# Patient Record
Sex: Female | Born: 1965
Health system: Southern US, Community
[De-identification: ages and names within clinical notes are randomized; demographics above are authoritative.]

## PROBLEM LIST (undated history)

## (undated) DIAGNOSIS — I509 Heart failure, unspecified: Secondary | ICD-10-CM

## (undated) DIAGNOSIS — L409 Psoriasis, unspecified: Secondary | ICD-10-CM

## (undated) DIAGNOSIS — I10 Essential (primary) hypertension: Secondary | ICD-10-CM

## (undated) DIAGNOSIS — E785 Hyperlipidemia, unspecified: Secondary | ICD-10-CM

## (undated) DIAGNOSIS — M1990 Unspecified osteoarthritis, unspecified site: Secondary | ICD-10-CM

## (undated) DIAGNOSIS — E079 Disorder of thyroid, unspecified: Secondary | ICD-10-CM

## (undated) DIAGNOSIS — Z973 Presence of spectacles and contact lenses: Secondary | ICD-10-CM

## (undated) DIAGNOSIS — I429 Cardiomyopathy, unspecified: Secondary | ICD-10-CM

## (undated) HISTORY — PX: TONSILLECTOMY: SUR1361

## (undated) HISTORY — DX: Disorder of thyroid, unspecified: E07.9

## (undated) HISTORY — DX: Cardiomyopathy, unspecified: I42.9

## (undated) HISTORY — DX: Psoriasis, unspecified: L40.9

## (undated) HISTORY — PX: TUBAL LIGATION: SHX77

## (undated) HISTORY — DX: Essential (primary) hypertension: I10

## (undated) HISTORY — DX: Hyperlipidemia, unspecified: E78.5

## (undated) HISTORY — PX: THYROIDECTOMY: SHX17

---

## 2010-07-12 DIAGNOSIS — E559 Vitamin D deficiency, unspecified: Secondary | ICD-10-CM | POA: Insufficient documentation

## 2010-07-12 DIAGNOSIS — I429 Cardiomyopathy, unspecified: Secondary | ICD-10-CM | POA: Insufficient documentation

## 2010-07-12 DIAGNOSIS — R928 Other abnormal and inconclusive findings on diagnostic imaging of breast: Secondary | ICD-10-CM | POA: Insufficient documentation

## 2010-07-12 DIAGNOSIS — G4700 Insomnia, unspecified: Secondary | ICD-10-CM | POA: Insufficient documentation

## 2010-07-12 DIAGNOSIS — Z01419 Encounter for gynecological examination (general) (routine) without abnormal findings: Secondary | ICD-10-CM | POA: Insufficient documentation

## 2010-07-12 DIAGNOSIS — Z1239 Encounter for other screening for malignant neoplasm of breast: Secondary | ICD-10-CM | POA: Insufficient documentation

## 2010-07-12 DIAGNOSIS — E039 Hypothyroidism, unspecified: Secondary | ICD-10-CM | POA: Insufficient documentation

## 2010-07-12 DIAGNOSIS — L4050 Arthropathic psoriasis, unspecified: Secondary | ICD-10-CM | POA: Insufficient documentation

## 2010-07-12 DIAGNOSIS — K219 Gastro-esophageal reflux disease without esophagitis: Secondary | ICD-10-CM | POA: Insufficient documentation

## 2011-11-02 DIAGNOSIS — G8929 Other chronic pain: Secondary | ICD-10-CM | POA: Insufficient documentation

## 2011-12-07 DIAGNOSIS — N921 Excessive and frequent menstruation with irregular cycle: Secondary | ICD-10-CM | POA: Insufficient documentation

## 2012-09-09 ENCOUNTER — Emergency Department: Payer: Self-pay | Admitting: Emergency Medicine

## 2012-09-09 LAB — BASIC METABOLIC PANEL
Anion Gap: 8 (ref 7–16)
BUN: 7 mg/dL (ref 7–18)
Calcium, Total: 8.7 mg/dL (ref 8.5–10.1)
Chloride: 107 mmol/L (ref 98–107)
Co2: 29 mmol/L (ref 21–32)
EGFR (African American): >60
EGFR (Non-African Amer.): >60
Glucose: 94 mg/dL (ref 65–99)
Osmolality: 285 (ref 275–301)
Potassium: 3.5 mmol/L (ref 3.5–5.1)
Sodium: 144 mmol/L (ref 136–145)

## 2012-09-09 LAB — CK TOTAL AND CKMB (NOT AT ARMC)
CK, Total: 84 U/L (ref 21–215)
CK-MB: 0.8 ng/mL (ref 0.5–3.6)

## 2012-09-09 LAB — CBC
HCT: 36.5 % (ref 35.0–47.0)
MCH: 28.7 pg (ref 26.0–34.0)
MCHC: 33 g/dL (ref 32.0–36.0)
Platelet: 222 10*3/uL (ref 150–440)
RBC: 4.21 10*6/uL (ref 3.80–5.20)

## 2013-04-30 ENCOUNTER — Encounter: Payer: Self-pay | Admitting: Cardiovascular Disease

## 2013-04-30 ENCOUNTER — Ambulatory Visit (INDEPENDENT_AMBULATORY_CARE_PROVIDER_SITE_OTHER): Payer: Commercial Managed Care - PPO | Admitting: Cardiovascular Disease

## 2013-04-30 VITALS — BP 110/72 | HR 79 | Ht 70.0 in | Wt 164.5 lb

## 2013-04-30 DIAGNOSIS — L409 Psoriasis, unspecified: Secondary | ICD-10-CM | POA: Insufficient documentation

## 2013-04-30 DIAGNOSIS — R079 Chest pain, unspecified: Secondary | ICD-10-CM

## 2013-04-30 DIAGNOSIS — I10 Essential (primary) hypertension: Secondary | ICD-10-CM | POA: Insufficient documentation

## 2013-04-30 DIAGNOSIS — I5022 Chronic systolic (congestive) heart failure: Secondary | ICD-10-CM | POA: Insufficient documentation

## 2013-04-30 DIAGNOSIS — R0602 Shortness of breath: Secondary | ICD-10-CM

## 2013-04-30 MED ORDER — LISINOPRIL 5 MG PO TABS: 5.0000 mg | ORAL_TABLET | Freq: Every day | ORAL | Status: DC

## 2013-04-30 NOTE — Patient Instructions (Addendum)
Taper the clonidine over the next week. Decrease the dose to one half tablet twice a day for the next 3 days. Didn't decrease the dose to one half tablet once a day for the next 3 days and then discontinue completely.  Start lisinopril 5 mg a day.  We will do lab work today BMET, CBC AND TSH.  Schedule appointment for Future Echo.   F/u in 2-3 months with Dr. Elease Hashimoto and Recheck BMET in 3 wks.

## 2013-04-30 NOTE — Progress Notes (Signed)
Bee Zech Date of Birth  08/09/1966       [facility] Office [address]  [address] [place], [place]  [address]   [place], [place]  [address] [phone]     [phone]   Fax  [phone]    Fax [phone]  Problem List: 1.  .Peripartum cardiomyopathy/  Chronic systolic CHF -  Dx 2002 after the birth of her son , EF 25% originally - most recently EF 60s 2. Hypertension 3. Psoriasis 4. Borderline diabetes mellitus  History of Present Illness:  Charlayne is a 73 ALT female with a history of peripartum cardiomyopathy. This was diagnosed in 2002 after the birth of her son. Her ejection fraction was 25% originally. She's been on medical therapy and it took quite some time to get her ejection fraction better but her most recent echocardiogram which was performed several years ago reveals an ejection fraction of around 60%.  She presents today with progressive shortness breath occurs doing her housework-vacuuming or other light housework.  She's never felt well since her original diagnosis in 2002. She now has trouble doing her normal activities.    It's been several years since she had an echocardiogram.  She still eats salt on occasion.   Current Outpatient Prescriptions  Medication Sig Dispense Refill  . Ascorbic Acid (VITAMIN C) 1000 MG tablet Take 1,000 mg by mouth daily.      . carvedilol (COREG) 25 MG tablet Take 25 mg by mouth 2 (two) times daily with a meal.      . cloNIDine (CATAPRES) 0.2 MG tablet Take 0.2 mg by mouth daily.      . digoxin (LANOXIN) 0.25 MG tablet Take 0.25 mg by mouth daily.      . fentaNYL (DURAGESIC - DOSED MCG/HR) 100 MCG/HR Place 1 patch onto the skin every 3 (three) days.      . furosemide (LASIX) 40 MG tablet Take 40 mg by mouth.      . levothyroxine (SYNTHROID, LEVOTHROID) 137 MCG tablet Take 137 mcg by mouth daily before breakfast.      . oxyCODONE-acetaminophen (PERCOCET/ROXICET) 5-325  MG per tablet Take 1 tablet by mouth every 4 (four) hours as needed for pain.      Marland Kitchen Ustekinumab (STELARA) 45 MG/0.5ML SOLN Inject into the skin every 3 (three) months.       No current facility-administered medications for this visit.     Allergies  Allergen Reactions  . Sulfa Antibiotics     Rash     Past Medical History  Diagnosis Date  . Cardiomyopathy   . Hyperlipidemia   . Hypertension   . Thyroid disease     Past Surgical History  Procedure Laterality Date  . Thyroidectomy    . Tonsillectomy    . Tubal ligation      History  Smoking status  . Former Smoker -- 0.25 packs/day for 15 years  . Types: Cigarettes  Smokeless tobacco  . Not on file    History  Alcohol Use No    Family History  Problem Relation Age of Onset  . Hypertension Mother   . Hyperlipidemia Mother   . Heart disease Father     CABG   . Hyperlipidemia Father   . Hypertension Father     Reviw of Systems:  Reviewed in the HPI.  All other systems are negative.  Physical Exam: Blood pressure 110/72, pulse 79, height 5\' 10"  (  1.778 m), weight 164 lb 8 oz (74.617 kg). General: Well developed, well nourished, in no acute distress.  She has multiple excoriations on her skin.  Head: Normocephalic, atraumatic, sclera non-icteric, mucus membranes are moist,   Neck: Supple. Carotids are 2 + without bruits. No JVD   Lungs: Clear   Heart: RR. Normal S1, S2  Abdomen: Soft, non-tender, non-distended with normal bowel sounds.  Msk:  Strength and tone are normal   Extremities: No clubbing or cyanosis. No edema.  Distal pedal pulses are 2+ and equal    Neuro: CN II - XII intact.  Alert and oriented X 3.  Psych:  Normal   ECG: 04/30/2013: Normal sinus rhythm at 70 beats a minute. She has no ST or T wave changes.  Assessment / Plan:

## 2013-04-30 NOTE — Assessment & Plan Note (Signed)
Danique presents for further evaluation and management of her chronic systolic congestive heart failure. She originally was diagnosed as having peripartum cardiomyopathy and her ejection fraction was 25%. She's been on medical therapy her last echocardiogram revealed an ejection fraction of around 60%.  More recently, she's been having worsening problems with shortness of breath particularly with exertion. She gets short of breath while she does her everyday housework and with vacuuming.  At this point I would like to start her on lisinopril 5 mg a day. We will taper the clonidine to off over the next week. We'll get an echocardiogram for further evaluation of her left ventricular size and function as well as the function of her other cardiac structures.  I've asked her to limit her salt intake.  We'll check a basic metabolic profile today. We'll also check a TSH and CBC.  I'll see her back in the office in 2-3 months for further evaluation.  We'll check a basic metabolic profile at that time.

## 2013-05-01 LAB — CBC WITH DIFFERENTIAL/PLATELET
Basophils Absolute: 0 10*3/uL (ref 0.0–0.2)
Eosinophils Absolute: 0.2 10*3/uL (ref 0.0–0.4)
HCT: 36.3 % (ref 34.0–46.6)
Hemoglobin: 12.2 g/dL (ref 11.1–15.9)
Lymphocytes Absolute: 1.8 10*3/uL (ref 0.7–3.1)
MCH: 29.1 pg (ref 26.6–33.0)
MCHC: 33.6 g/dL (ref 31.5–35.7)
Neutrophils Absolute: 4 10*3/uL (ref 1.4–7.0)

## 2013-05-01 LAB — BASIC METABOLIC PANEL
GFR calc Af Amer: 96 mL/min/{1.73_m2} (ref >59)
GFR calc non Af Amer: 84 mL/min/{1.73_m2} (ref >59)
Potassium: 4.1 mmol/L (ref 3.5–5.2)
Sodium: 139 mmol/L (ref 134–144)

## 2013-05-10 ENCOUNTER — Other Ambulatory Visit (INDEPENDENT_AMBULATORY_CARE_PROVIDER_SITE_OTHER): Payer: Commercial Managed Care - PPO

## 2013-05-10 ENCOUNTER — Other Ambulatory Visit: Payer: Self-pay

## 2013-05-10 DIAGNOSIS — R079 Chest pain, unspecified: Secondary | ICD-10-CM

## 2013-05-10 DIAGNOSIS — R0602 Shortness of breath: Secondary | ICD-10-CM

## 2013-05-14 ENCOUNTER — Telehealth: Payer: Self-pay

## 2013-05-14 NOTE — Telephone Encounter (Signed)
Pt aware of results. She will call with any questions or concerns.

## 2013-05-14 NOTE — Telephone Encounter (Signed)
Message copied by Marilynne Halsted on Tue May 14, 2013  9:18 AM ------      Message from: Vesta Mixer      Created: Mon May 13, 2013  6:08 AM       Echo is basically normal.  Mild MR       ------

## 2013-05-21 ENCOUNTER — Other Ambulatory Visit: Payer: Self-pay

## 2013-05-21 ENCOUNTER — Ambulatory Visit (INDEPENDENT_AMBULATORY_CARE_PROVIDER_SITE_OTHER): Payer: Commercial Managed Care - PPO

## 2013-05-21 ENCOUNTER — Encounter: Payer: Self-pay | Admitting: Cardiovascular Disease

## 2013-05-21 DIAGNOSIS — I509 Heart failure, unspecified: Secondary | ICD-10-CM

## 2013-05-21 DIAGNOSIS — Z79899 Other long term (current) drug therapy: Secondary | ICD-10-CM

## 2013-05-21 DIAGNOSIS — I10 Essential (primary) hypertension: Secondary | ICD-10-CM

## 2013-05-21 DIAGNOSIS — I5022 Chronic systolic (congestive) heart failure: Secondary | ICD-10-CM

## 2013-05-21 MED ORDER — CARVEDILOL 25 MG PO TABS: 25.0000 mg | ORAL_TABLET | Freq: Two times a day (BID) | ORAL | Status: DC

## 2013-05-21 NOTE — Telephone Encounter (Signed)
Refill sent for coreg. 

## 2013-05-21 NOTE — Telephone Encounter (Signed)
Message given to Glenna Crisco in the registration desk, to change pt's address and phone number in pt's chart.

## 2013-05-27 ENCOUNTER — Telehealth: Payer: Self-pay

## 2013-05-27 NOTE — Telephone Encounter (Signed)
Message copied by Marilynne Halsted on Mon May 27, 2013  8:06 AM ------      Message from: Vesta Mixer      Created: Sat May 25, 2013  7:42 AM       Normal BMP ------

## 2013-05-27 NOTE — Telephone Encounter (Signed)
Left message on voicemail that results were normal and to please call the office with any questions or concerns.

## 2013-07-10 ENCOUNTER — Ambulatory Visit: Payer: Commercial Managed Care - PPO | Admitting: Cardiovascular Disease

## 2013-07-11 ENCOUNTER — Other Ambulatory Visit: Payer: Self-pay

## 2013-08-22 DIAGNOSIS — R739 Hyperglycemia, unspecified: Secondary | ICD-10-CM | POA: Insufficient documentation

## 2013-09-05 HISTORY — PX: ENDOMETRIAL ABLATION: SHX621

## 2013-10-10 DIAGNOSIS — D219 Benign neoplasm of connective and other soft tissue, unspecified: Secondary | ICD-10-CM | POA: Insufficient documentation

## 2013-10-15 ENCOUNTER — Telehealth: Payer: Self-pay | Admitting: *Deleted

## 2013-10-15 DIAGNOSIS — N920 Excessive and frequent menstruation with regular cycle: Secondary | ICD-10-CM | POA: Insufficient documentation

## 2013-10-15 NOTE — Telephone Encounter (Signed)
Dr. Maylene Roes  called and would like to do  Endometorsis ablation surgery. She would like surgical clearance on this patient. Please fax to [phone]

## 2013-10-16 NOTE — Telephone Encounter (Signed)
The patient has  normal LV function.  She should be at low risk for her upcoming GYN surgery. She needs an office visit with me in the next month or so.

## 2013-10-17 ENCOUNTER — Encounter: Payer: Self-pay | Admitting: *Deleted

## 2013-10-17 NOTE — Telephone Encounter (Signed)
Informed patient that she was at low cardiac risk for her GYN procedure.   Faxed clearance letter

## 2013-10-29 DIAGNOSIS — O903 Peripartum cardiomyopathy: Secondary | ICD-10-CM | POA: Insufficient documentation

## 2013-10-29 DIAGNOSIS — N946 Dysmenorrhea, unspecified: Secondary | ICD-10-CM | POA: Insufficient documentation

## 2013-10-29 DIAGNOSIS — I509 Heart failure, unspecified: Secondary | ICD-10-CM | POA: Insufficient documentation

## 2013-12-27 ENCOUNTER — Other Ambulatory Visit: Payer: Self-pay

## 2013-12-27 MED ORDER — CARVEDILOL 25 MG PO TABS: 25.0000 mg | ORAL_TABLET | Freq: Two times a day (BID) | ORAL | Status: DC

## 2013-12-27 MED ORDER — LISINOPRIL 5 MG PO TABS: 5.0000 mg | ORAL_TABLET | Freq: Every day | ORAL | Status: DC

## 2013-12-27 NOTE — Telephone Encounter (Signed)
Refill sent for coreg. 

## 2014-01-13 ENCOUNTER — Ambulatory Visit: Payer: Commercial Managed Care - PPO | Admitting: Cardiovascular Disease

## 2014-01-24 ENCOUNTER — Ambulatory Visit: Payer: Commercial Managed Care - PPO | Admitting: Cardiovascular Disease

## 2014-01-24 ENCOUNTER — Encounter: Payer: Self-pay | Admitting: *Deleted

## 2014-04-21 ENCOUNTER — Other Ambulatory Visit: Payer: Self-pay

## 2014-04-21 MED ORDER — LISINOPRIL 5 MG PO TABS: 5.0000 mg | ORAL_TABLET | Freq: Every day | ORAL | Status: DC

## 2014-06-19 ENCOUNTER — Other Ambulatory Visit: Payer: Self-pay | Admitting: Cardiovascular Disease

## 2014-10-31 ENCOUNTER — Other Ambulatory Visit: Payer: Self-pay | Admitting: Cardiovascular Disease

## 2014-12-19 ENCOUNTER — Other Ambulatory Visit: Payer: Self-pay | Admitting: Cardiovascular Disease

## 2015-02-11 DIAGNOSIS — Z79899 Other long term (current) drug therapy: Secondary | ICD-10-CM | POA: Insufficient documentation

## 2015-08-06 ENCOUNTER — Encounter: Payer: Self-pay | Admitting: Family Medicine

## 2015-08-06 ENCOUNTER — Telehealth: Payer: Self-pay | Admitting: Family Medicine

## 2015-08-06 ENCOUNTER — Ambulatory Visit (INDEPENDENT_AMBULATORY_CARE_PROVIDER_SITE_OTHER): Payer: Commercial Managed Care - PPO | Admitting: Family Medicine

## 2015-08-06 VITALS — BP 146/88 | HR 83 | Temp 98.1°F | Resp 16 | Ht 70.0 in | Wt 172.8 lb

## 2015-08-06 DIAGNOSIS — L4050 Arthropathic psoriasis, unspecified: Secondary | ICD-10-CM | POA: Diagnosis not present

## 2015-08-06 DIAGNOSIS — Z0189 Encounter for other specified special examinations: Secondary | ICD-10-CM

## 2015-08-06 DIAGNOSIS — F32A Depression, unspecified: Secondary | ICD-10-CM | POA: Insufficient documentation

## 2015-08-06 DIAGNOSIS — E079 Disorder of thyroid, unspecified: Secondary | ICD-10-CM | POA: Diagnosis not present

## 2015-08-06 DIAGNOSIS — O903 Peripartum cardiomyopathy: Secondary | ICD-10-CM | POA: Diagnosis not present

## 2015-08-06 DIAGNOSIS — I10 Essential (primary) hypertension: Secondary | ICD-10-CM | POA: Diagnosis not present

## 2015-08-06 DIAGNOSIS — Z0289 Encounter for other administrative examinations: Secondary | ICD-10-CM

## 2015-08-06 DIAGNOSIS — Z79891 Long term (current) use of opiate analgesic: Secondary | ICD-10-CM

## 2015-08-06 DIAGNOSIS — F329 Major depressive disorder, single episode, unspecified: Secondary | ICD-10-CM | POA: Diagnosis not present

## 2015-08-06 DIAGNOSIS — I5022 Chronic systolic (congestive) heart failure: Secondary | ICD-10-CM

## 2015-08-06 DIAGNOSIS — F1120 Opioid dependence, uncomplicated: Secondary | ICD-10-CM | POA: Insufficient documentation

## 2015-08-06 DIAGNOSIS — Z0283 Encounter for blood-alcohol and blood-drug test: Secondary | ICD-10-CM

## 2015-08-06 MED ORDER — CLINDAMYCIN HCL 150 MG PO CAPS: 150.0000 mg | ORAL_CAPSULE | Freq: Every day | ORAL | Status: DC

## 2015-08-06 MED ORDER — LEVOTHYROXINE SODIUM 137 MCG PO TABS: 137.0000 ug | ORAL_TABLET | Freq: Every day | ORAL | Status: DC

## 2015-08-06 MED ORDER — OXYCODONE-ACETAMINOPHEN 10-325 MG PO TABS: 1.0000 | ORAL_TABLET | ORAL | Status: DC | PRN

## 2015-08-06 MED ORDER — VENLAFAXINE HCL ER 75 MG PO CP24: 75.0000 mg | ORAL_CAPSULE | Freq: Every day | ORAL | Status: DC

## 2015-08-06 MED ORDER — LISINOPRIL 5 MG PO TABS: 5.0000 mg | ORAL_TABLET | Freq: Every day | ORAL | Status: DC

## 2015-08-06 MED ORDER — CARVEDILOL 25 MG PO TABS: 25.0000 mg | ORAL_TABLET | Freq: Two times a day (BID) | ORAL | Status: DC

## 2015-08-06 MED ORDER — DIGOXIN 250 MCG PO TABS: 0.2500 mg | ORAL_TABLET | Freq: Every day | ORAL | Status: DC

## 2015-08-06 MED ORDER — OXYCODONE HCL ER 30 MG PO T12A: 1.0000 | EXTENDED_RELEASE_TABLET | Freq: Two times a day (BID) | ORAL | Status: DC

## 2015-08-06 NOTE — Patient Instructions (Signed)
We need you to come back in 1 mos for pain medication refills.   Please call Dr. Julious Payer office to schedule a follow-up in the near future.

## 2015-08-06 NOTE — Assessment & Plan Note (Signed)
BP is elevated today in the office. Continue current medication regiment. Consider increasing at 1 mos follow-up.

## 2015-08-06 NOTE — Assessment & Plan Note (Signed)
Pt is aware that this practice does not prescribe long term opioids for pain management. I have agreed to cover her pain needs until she can see a rheumatologist for further evaluation of her psoriatic arthritis.  An opioid use agreement was signed in the office. Pt was drug tested. Pt is aware she must use the same pharmacy. A 30 day supply will be given only. She must come for an office visit for refills.

## 2015-08-06 NOTE — Assessment & Plan Note (Addendum)
Renewed levothyroxine. Last TSH 1.3 in 06/2015.

## 2015-08-06 NOTE — Progress Notes (Signed)
Subjective:    Patient ID: Krista Kennedy, female    DOB: 1966/09/04, 49 y.o.   MRN: [id number]  HPI: Krista Kennedy is a 49 y.o. female presenting on 08/06/2015 for Establish Care   HPI  Pt presents to establish care today. Previous care provider was [facility].  It has been 2 mos since  her last PCP visit. Records from previous provider will be requested and reviewed. Current medical problems include:  Peripartum cardiomyopathy: Dx in 2002 after birth of child.  Was seen by Dr. Cathie Olden at [facility] heart care. Has been a few years since she was seen. Last echo 60% in 2014. Digoxin for CHF, coreg 25mg  BID, she was previously on 40mg  of lasix, not currently taking. HTN: Dx during pregnancy. Currently 5mg  of lisinopril.  Psoriasis with arthropathy: Has a local dermatologist- would like to change. Taking stelara for psorasis-taking 45mg  injection q3 mos. Patient is taking oxycodone for joint pain. Her PCP was managing. SHe has not seen a rheumatologist in the past.  Hypothyroidism: Stable on 180mcg dosing. Removed portion of thyroid 2002. Depression: taking 75mg  effexor daily. Controlling symptoms well.   Per old records, pt has pre-diabetes.   Health maintenance:  Las pap 2013 with HPV co-testing- due 2018.  Mammogram 2011    Past Medical History  Diagnosis Date  . Cardiomyopathy ([place])   . Hyperlipidemia   . Hypertension   . Thyroid disease   . Psoriasis    Social History   Social History  . Marital Status: Married    Spouse Name: N/A  . Number of Children: N/A  . Years of Education: N/A   Occupational History  . Not on file.   Social History Main Topics  . Smoking status: Former Smoker -- 0.25 packs/day for 15 years    Types: Cigarettes  . Smokeless tobacco: Not on file  . Alcohol Use: No  . Drug Use: No  . Sexual Activity: Yes   Other Topics Concern  . Not on file   Social History Narrative   Family History  Problem Relation Age of Onset  .  Hypertension Mother   . Hyperlipidemia Mother   . Heart disease Father     CABG   . Hyperlipidemia Father   . Hypertension Father    Current Outpatient Prescriptions on File Prior to Visit  Medication Sig  . Ascorbic Acid (VITAMIN C) 1000 MG tablet Take 1,000 mg by mouth daily.  . furosemide (LASIX) 40 MG tablet Take 40 mg by mouth.  . Ustekinumab (STELARA) 45 MG/0.5ML SOLN Inject into the skin every 3 (three) months.   No current facility-administered medications on file prior to visit.    Review of Systems  Constitutional: Negative for fever and chills.  HENT: Negative.   Respiratory: Negative for cough, chest tightness and wheezing.   Cardiovascular: Negative for chest pain and leg swelling.  Gastrointestinal: Negative for nausea, vomiting, abdominal pain, diarrhea and constipation.  Endocrine: Negative.  Negative for cold intolerance, heat intolerance, polydipsia, polyphagia and polyuria.  Genitourinary: Negative for dysuria and difficulty urinating.  Musculoskeletal: Positive for arthralgias.  Skin: Positive for rash.  Neurological: Negative for dizziness, light-headedness and numbness.  Psychiatric/Behavioral: Negative.    Per HPI unless specifically indicated above     Objective:    BP 146/88 mmHg  Pulse 83  Temp(Src) 98.1 F (36.7 C) (Oral)  Resp 16  Ht 5\' 10"  (1.778 m)  Wt 172 lb 12.8 oz (78.382 kg)  BMI 24.79  kg/m2  LMP 04/06/2015  Wt Readings from Last 3 Encounters:  08/06/15 172 lb 12.8 oz (78.382 kg)  04/30/13 164 lb 8 oz (74.617 kg)    Physical Exam  Constitutional: She is oriented to person, place, and time. She appears well-developed and well-nourished.  HENT:  Head: Normocephalic and atraumatic.  Neck: Neck supple.  Cardiovascular: Normal rate, regular rhythm and normal heart sounds.  Exam reveals no gallop and no friction rub.   No murmur heard. Pulmonary/Chest: Effort normal and breath sounds normal. She has no wheezes. She exhibits no  tenderness.  Abdominal: Soft. Normal appearance and bowel sounds are normal. She exhibits no distension and no mass. There is no tenderness. There is no rebound and no guarding.  Musculoskeletal: Normal range of motion. She exhibits no edema.       Right elbow: She exhibits normal range of motion, no swelling and no effusion. Tenderness found.       Left elbow: She exhibits no swelling, no effusion and no deformity. Tenderness found.       Right knee: She exhibits normal range of motion, no swelling, no effusion and no erythema. Tenderness found.       Left knee: She exhibits normal range of motion and no swelling. Tenderness found.  Lymphadenopathy:    She has no cervical adenopathy.  Neurological: She is alert and oriented to person, place, and time.  Skin: Skin is warm and dry. Lesion (psoriatic lesions on face, hands, arms, and chest. ) noted.   Results for orders placed or performed in visit on 04/30/13  CBC with Differential  Result Value Ref Range   WBC 6.4 3.4 - 10.8 x10E3/uL   RBC 4.19 3.77 - 5.28 x10E6/uL   Hemoglobin 12.2 11.1 - 15.9 g/dL   HCT 36.3 34.0 - 46.6 %   MCV 87 79 - 97 fL   MCH 29.1 26.6 - 33.0 pg   MCHC 33.6 31.5 - 35.7 g/dL   RDW 13.5 12.3 - 15.4 %   Neutrophils Relative % 61 40 - 74 %   Lymphs 28 14 - 46 %   Monocytes 8 4 - 12 %   Eos 2 0 - 5 %   Basos 1 0 - 3 %   Neutrophils Absolute 4.0 1.4 - 7.0 x10E3/uL   Lymphocytes Absolute 1.8 0.7 - 3.1 x10E3/uL   Monocytes Absolute 0.5 0.1 - 0.9 x10E3/uL   Eosinophils Absolute 0.2 0.0 - 0.4 x10E3/uL   Basophils Absolute 0.0 0.0 - 0.2 x10E3/uL   Immature Granulocytes 0 0 - 2 %   Immature Grans (Abs) 0.0 0.0 - 0.1 x10E3/uL  TSH  Result Value Ref Range   TSH 2.050 0.450 - 4.500 uIU/mL  Basic metabolic panel  Result Value Ref Range   Glucose 103 (H) 65 - 99 mg/dL   BUN 9 6 - 24 mg/dL   Creatinine, Ser 0.84 0.57 - 1.00 mg/dL   GFR calc non Af Amer 84 >59 mL/min/1.73   GFR calc Af Amer 96 >59 mL/min/1.73    BUN/Creatinine Ratio 11 9 - 23   Sodium 139 134 - 144 mmol/L   Potassium 4.1 3.5 - 5.2 mmol/L   Chloride 99 97 - 108 mmol/L   CO2 24 18 - 29 mmol/L   Calcium 8.5 (L) 8.7 - 10.2 mg/dL      Assessment & Plan:   Problem List Items Addressed This Visit      Cardiovascular and Mediastinum   Chronic systolic congestive heart failure ([place])  Pt has not seen cardiology in over 2 years. Her last EF was 60%.  She is due for cardiology follow-up in the near future. Pt asked to schedule this appt to monitor her CHF. On ACE, betablocker, and digoxin. Last digoxin level checked by PCP in September.       Relevant Medications   digoxin (LANOXIN) 0.25 MG tablet   carvedilol (COREG) 25 MG tablet   lisinopril (PRINIVIL,ZESTRIL) 5 MG tablet   Essential hypertension    BP is elevated today in the office. Continue current medication regiment. Consider increasing at 1 mos follow-up.       Relevant Medications   digoxin (LANOXIN) 0.25 MG tablet   carvedilol (COREG) 25 MG tablet   lisinopril (PRINIVIL,ZESTRIL) 5 MG tablet   Other Relevant Orders   Basic Metabolic Panel (BMET)   Cardiomyopathy in the puerperium   Relevant Medications   digoxin (LANOXIN) 0.25 MG tablet   carvedilol (COREG) 25 MG tablet   lisinopril (PRINIVIL,ZESTRIL) 5 MG tablet     Endocrine   Disease of thyroid gland    Renewed levothyroxine. Last TSH 1.3 in 06/2015.       Relevant Medications   levothyroxine (SYNTHROID, LEVOTHROID) 137 MCG tablet   carvedilol (COREG) 25 MG tablet     Musculoskeletal and Integument   Psoriasis with arthropathy ([place]) - Primary    Seeing dermatology for Stelara.  Recommend referral to Rheumatology for management, particularly since her joints are affected. Pt has consented when she was informed PCP will not do long term pain management and seeing a rheumatologist will help with her joints.  Pt would like to see a new dermatologist for injections- referred to dermatology today.        Relevant Medications   clindamycin (CLEOCIN) 150 MG capsule   Other Relevant Orders   Ambulatory referral to Rheumatology   Ambulatory referral to Dermatology     Other   Opioid use agreement exists    Pt is aware that this practice does not prescribe long term opioids for pain management. I have agreed to cover her pain needs until she can see a rheumatologist for further evaluation of her psoriatic arthritis.  An opioid use agreement was signed in the office. Pt was drug tested. Pt is aware she must use the same pharmacy. A 30 day supply will be given only. She must come for an office visit for refills.         Other Visit Diagnoses    Depression        Relevant Medications    venlafaxine XR (EFFEXOR-XR) 75 MG 24 hr capsule    Encounter for drug screening           Meds ordered this encounter  Medications  . clobetasol (TEMOVATE) 0.05 % external solution    Sig: Apply topically 2 (two) times daily.  Marland Kitchen DISCONTD: clobetasol ointment (TEMOVATE) 0.05 %    Sig: apply externally to affected area twice a day  . DISCONTD: venlafaxine XR (EFFEXOR-XR) 75 MG 24 hr capsule    Sig: Take 1 capsule (75 mg total) by mouth daily.  Marland Kitchen DISCONTD: oxyCODONE-acetaminophen (PERCOCET) 10-325 MG tablet    Sig: 1 tablet.  . Cholecalciferol (VITAMIN D-1000 MAX ST) 1000 UNITS tablet    Sig: Take by mouth.  . digoxin (LANOXIN) 0.25 MG tablet    Sig: Take 1 tablet (0.25 mg total) by mouth daily.    Dispense:  90 tablet    Refill:  3  Order Specific Question:  Supervising Provider    Answer:  Arlis Porta [id number]  . levothyroxine (SYNTHROID, LEVOTHROID) 137 MCG tablet    Sig: Take 1 tablet (137 mcg total) by mouth daily before breakfast.    Dispense:  90 tablet    Refill:  3    Order Specific Question:  Supervising Provider    Answer:  Arlis Porta [phone]  . carvedilol (COREG) 25 MG tablet    Sig: Take 1 tablet (25 mg total) by mouth 2 (two) times daily with a meal.    Dispense:   180 tablet    Refill:  3    Order Specific Question:  Supervising Provider    Answer:  Arlis Porta [phone]  . lisinopril (PRINIVIL,ZESTRIL) 5 MG tablet    Sig: Take 1 tablet (5 mg total) by mouth daily.    Dispense:  90 tablet    Refill:  3    ..Patient needs to contact office to schedule  Appointment  for future refills.[phone]. Thank you.    Order Specific Question:  Supervising Provider    Answer:  Arlis Porta [id number]  . venlafaxine XR (EFFEXOR-XR) 75 MG 24 hr capsule    Sig: Take 1 capsule (75 mg total) by mouth daily with breakfast.    Dispense:  90 capsule    Refill:  3    Order Specific Question:  Supervising Provider    Answer:  Arlis Porta [phone]  . clindamycin (CLEOCIN) 150 MG capsule    Sig: Take 1 capsule (150 mg total) by mouth daily.    Dispense:  30 capsule    Refill:  11    Order Specific Question:  Supervising Provider    Answer:  Arlis Porta [phone]  . oxyCODONE 30 MG 12 hr tablet    Sig: Take 1 tablet by mouth every 12 (twelve) hours.    Dispense:  60 each    Refill:  0    Order Specific Question:  Supervising Provider    Answer:  Arlis Porta [phone]  . oxyCODONE-acetaminophen (PERCOCET) 10-325 MG tablet    Sig: Take 1 tablet by mouth every 4 (four) hours as needed for pain.    Dispense:  100 tablet    Refill:  0    Order Specific Question:  Supervising Provider    Answer:  Arlis Porta [id number]      Follow up plan: Return in about 4 weeks (around 09/03/2015) for pain medication.

## 2015-08-06 NOTE — Telephone Encounter (Signed)
Pt called her oxycodone prescription does not have enough pills. She takes 4 per day. I only wrote her for 100 as I used the bottle from a previous provider. Per the [place] CSRS she has filled 180 in the past. I have consented to rewrite the prescription.   Call her pharmacy to void current prescription and was told she had voided multiple opioid prescriptions today. Previous provider had sent in one for 30 and she filled it this AM. She then voided it when dropping off my current prescription. Pt did not make me aware of this. This does not void current pain contract as it occurred before it was signed. Will reinforce pain contract tomorrow.

## 2015-08-06 NOTE — Assessment & Plan Note (Signed)
Pt has not seen cardiology in over 2 years. Her last EF was 60%.  She is due for cardiology follow-up in the near future. Pt asked to schedule this appt to monitor her CHF. On ACE, betablocker, and digoxin. Last digoxin level checked by PCP in September.

## 2015-08-06 NOTE — Assessment & Plan Note (Signed)
Seeing dermatology for Stelara.  Recommend referral to Rheumatology for management, particularly since her joints are affected. Pt has consented when she was informed PCP will not do long term pain management and seeing a rheumatologist will help with her joints.  Pt would like to see a new dermatologist for injections- referred to dermatology today.

## 2015-08-07 ENCOUNTER — Other Ambulatory Visit: Payer: Self-pay | Admitting: Family Medicine

## 2015-08-07 DIAGNOSIS — L4050 Arthropathic psoriasis, unspecified: Secondary | ICD-10-CM

## 2015-08-07 MED ORDER — OXYCODONE-ACETAMINOPHEN 10-325 MG PO TABS: 1.0000 | ORAL_TABLET | ORAL | Status: DC | PRN

## 2015-08-07 NOTE — Progress Notes (Signed)
Spoke with patient regarding previous opioid prescription. Made patient aware that in the future she needs to make me aware of the prescription from other provider. It was questioned by the pharmacy. Any future situations like this will result in voiding the pain contract.

## 2015-08-12 ENCOUNTER — Encounter: Payer: Self-pay | Admitting: *Deleted

## 2015-08-13 DIAGNOSIS — Z79899 Other long term (current) drug therapy: Secondary | ICD-10-CM | POA: Insufficient documentation

## 2015-09-03 ENCOUNTER — Ambulatory Visit: Payer: Self-pay | Admitting: Family Medicine

## 2015-09-08 ENCOUNTER — Encounter: Payer: Self-pay | Admitting: Family Medicine

## 2015-09-08 ENCOUNTER — Ambulatory Visit (INDEPENDENT_AMBULATORY_CARE_PROVIDER_SITE_OTHER): Payer: Commercial Managed Care - PPO | Admitting: Family Medicine

## 2015-09-08 VITALS — BP 142/88 | HR 82 | Temp 97.8°F | Resp 16 | Ht 70.0 in | Wt 169.6 lb

## 2015-09-08 DIAGNOSIS — Z0289 Encounter for other administrative examinations: Secondary | ICD-10-CM | POA: Diagnosis not present

## 2015-09-08 DIAGNOSIS — J301 Allergic rhinitis due to pollen: Secondary | ICD-10-CM

## 2015-09-08 DIAGNOSIS — A499 Bacterial infection, unspecified: Secondary | ICD-10-CM | POA: Diagnosis not present

## 2015-09-08 DIAGNOSIS — N760 Acute vaginitis: Secondary | ICD-10-CM | POA: Diagnosis not present

## 2015-09-08 DIAGNOSIS — B9689 Other specified bacterial agents as the cause of diseases classified elsewhere: Secondary | ICD-10-CM

## 2015-09-08 DIAGNOSIS — J0110 Acute frontal sinusitis, unspecified: Secondary | ICD-10-CM | POA: Diagnosis not present

## 2015-09-08 DIAGNOSIS — L4050 Arthropathic psoriasis, unspecified: Secondary | ICD-10-CM

## 2015-09-08 DIAGNOSIS — Z79891 Long term (current) use of opiate analgesic: Secondary | ICD-10-CM

## 2015-09-08 MED ORDER — FLUTICASONE PROPIONATE 50 MCG/ACT NA SUSP: 2.0000 | Freq: Every day | NASAL | Status: DC

## 2015-09-08 MED ORDER — OXYCODONE-ACETAMINOPHEN 10-325 MG PO TABS: 1.0000 | ORAL_TABLET | ORAL | Status: DC | PRN

## 2015-09-08 MED ORDER — METRONIDAZOLE 500 MG PO TABS: 500.0000 mg | ORAL_TABLET | Freq: Two times a day (BID) | ORAL | Status: DC

## 2015-09-08 MED ORDER — OXYCODONE HCL ER 30 MG PO T12A: 1.0000 | EXTENDED_RELEASE_TABLET | Freq: Two times a day (BID) | ORAL | Status: DC

## 2015-09-08 MED ORDER — OLOPATADINE HCL 0.1 % OP SOLN
1.0000 [drp] | Freq: Two times a day (BID) | OPHTHALMIC | Status: DC
Start: 2015-09-08 — End: 2017-03-15

## 2015-09-08 NOTE — Assessment & Plan Note (Signed)
Flonase renewed. Patanol drops given.

## 2015-09-08 NOTE — Progress Notes (Signed)
Subjective:    Patient ID: Krista Kennedy, female    DOB: 03-31-1966, 50 y.o.   MRN: [id number]  HPI: Krista Kennedy is a 50 y.o. female presenting on 09/08/2015 for Arthritis and Vaginal Discharge   HPI  Pt presents for renewal of pain medications. She is waiting to get into chronic pain for her rheumatoid arthritis.  Xrays were done with rheumatology.  She is taking oyxcontin 30mg  BID and oxycodone IR 6 times per day.  Drug screen was normal. However the [place] does not match was was prescribed. There was an issue with pharmacy on 12/1- her previous provider had e-prescribed her a 15 days supply to get her to a new provider.  Spoke with pharmacy on 12/1 to clear up confusion with prescriptions- however per her bottle and CSRS it appears they filled the previous provider's prescription and not the one from this office. Pt is out of all medications.   She is also reporting sinus drainage x1 week. With facial pressure and swelling.  Pain behind the eyes. She is allergic to christmas trees and having trouble with her allergies. Out of flonase at home.  Pt also reporting copious amts of clear vaginal discharge with fishy odor.     Past Medical History  Diagnosis Date  . Cardiomyopathy ([place])   . Hyperlipidemia   . Hypertension   . Thyroid disease   . Psoriasis     Current Outpatient Prescriptions on File Prior to Visit  Medication Sig  . Ascorbic Acid (VITAMIN C) 1000 MG tablet Take 1,000 mg by mouth daily.  . carvedilol (COREG) 25 MG tablet Take 1 tablet (25 mg total) by mouth 2 (two) times daily with a meal.  . Cholecalciferol (VITAMIN D-1000 MAX ST) 1000 UNITS tablet Take by mouth.  . clindamycin (CLEOCIN) 150 MG capsule Take 1 capsule (150 mg total) by mouth daily.  . clobetasol (TEMOVATE) 0.05 % external solution Apply topically 2 (two) times daily.  . digoxin (LANOXIN) 0.25 MG tablet Take 1 tablet (0.25 mg total) by mouth daily.  . furosemide (LASIX) 40 MG tablet Take 40 mg  by mouth.  . levothyroxine (SYNTHROID, LEVOTHROID) 137 MCG tablet Take 1 tablet (137 mcg total) by mouth daily before breakfast.  . lisinopril (PRINIVIL,ZESTRIL) 5 MG tablet Take 1 tablet (5 mg total) by mouth daily.  Marland Kitchen Ustekinumab (STELARA) 45 MG/0.5ML SOLN Inject into the skin every 3 (three) months.  . venlafaxine XR (EFFEXOR-XR) 75 MG 24 hr capsule Take 1 capsule (75 mg total) by mouth daily with breakfast.   No current facility-administered medications on file prior to visit.    Review of Systems  Constitutional: Negative for fever and chills.  HENT: Positive for congestion and sinus pressure. Negative for ear pain, sneezing and sore throat.   Eyes: Positive for itching.  Respiratory: Negative for cough, chest tightness and wheezing.   Cardiovascular: Negative for chest pain and palpitations.  Gastrointestinal: Negative.  Negative for nausea, vomiting and diarrhea.  Genitourinary: Positive for vaginal discharge. Negative for vaginal bleeding, difficulty urinating, vaginal pain and dyspareunia.  Musculoskeletal: Positive for joint swelling and arthralgias (L hip pain.). Negative for gait problem, neck pain and neck stiffness.  Neurological: Negative for headaches.   Per HPI unless specifically indicated above     Objective:    BP 142/88 mmHg  Pulse 82  Temp(Src) 97.8 F (36.6 C) (Oral)  Resp 16  Ht 5\' 10"  (1.778 m)  Wt 169 lb 9.6 oz (76.93 kg)  BMI 24.34  kg/m2  Wt Readings from Last 3 Encounters:  09/08/15 169 lb 9.6 oz (76.93 kg)  08/06/15 172 lb 12.8 oz (78.382 kg)  04/30/13 164 lb 8 oz (74.617 kg)    Physical Exam  Constitutional: She is oriented to person, place, and time. She appears well-developed and well-nourished.  HENT:  Head: Normocephalic and atraumatic.  Eyes: EOM are normal. Pupils are equal, round, and reactive to light. Right conjunctiva is injected. Left conjunctiva is injected.  Neck: Neck supple.  Cardiovascular: Normal rate, regular rhythm and  normal heart sounds.  Exam reveals no gallop and no friction rub.   No murmur heard. Pulmonary/Chest: Effort normal and breath sounds normal. She has no wheezes. She exhibits no tenderness.  Abdominal: Soft. Normal appearance and bowel sounds are normal. She exhibits no distension and no mass. There is no tenderness. There is no rebound and no guarding.  Musculoskeletal: Normal range of motion. She exhibits no edema or tenderness.  Lymphadenopathy:    She has no cervical adenopathy.  Neurological: She is alert and oriented to person, place, and time.  Skin: Skin is warm and dry.       Assessment & Plan:   Problem List Items Addressed This Visit      Respiratory   Allergic rhinitis due to pollen    Flonase renewed. Patanol drops given.       Relevant Medications   fluticasone (FLONASE) 50 MCG/ACT nasal spray     Musculoskeletal and Integument   Psoriasis with arthropathy (HCC)   Relevant Medications   oxyCODONE 30 MG 12 hr tablet   oxyCODONE-acetaminophen (PERCOCET) 10-325 MG tablet     Other   Opioid use agreement exists - Primary    Drug tested today. Pt is aware that she cannot have any further prescriptions from her previous provider. Due to issue with the pharmacy we will fill today since I personally spoke with pharmacy and it appears they filled the wrong prescription.  My prescription from 12/1 was cancelled. However if her prescriptions do not match CSRS again she will no longer receive pain medication from this office.  Awaiting chronic pain referral.       Relevant Medications   oxyCODONE 30 MG 12 hr tablet   Other Relevant Orders   ToxASSURE Select 13 (MW), Urine    Other Visit Diagnoses    Acute frontal sinusitis, recurrence not specified        Relevant Medications    fluticasone (FLONASE) 50 MCG/ACT nasal spray    metroNIDAZOLE (FLAGYL) 500 MG tablet    BV (bacterial vaginosis)        Treat with flagyl BID x 7 days.     Relevant Medications     metroNIDAZOLE (FLAGYL) 500 MG tablet       Meds ordered this encounter  Medications  . meloxicam (MOBIC) 7.5 MG tablet    Sig: Take by mouth.  . oxyCODONE 30 MG 12 hr tablet    Sig: Take 1 tablet by mouth every 12 (twelve) hours.    Dispense:  60 each    Refill:  0    Order Specific Question:  Supervising Provider    Answer:  Arlis Porta [phone]  . oxyCODONE-acetaminophen (PERCOCET) 10-325 MG tablet    Sig: Take 1 tablet by mouth every 4 (four) hours as needed for pain.    Dispense:  180 tablet    Refill:  0    Order Specific Question:  Supervising Provider    Answer:  Arlis Porta [id number]  . fluticasone (FLONASE) 50 MCG/ACT nasal spray    Sig: Place 2 sprays into both nostrils daily.    Dispense:  16 g    Refill:  3    Order Specific Question:  Supervising Provider    Answer:  Arlis Porta [id number]  . olopatadine (PATANOL) 0.1 % ophthalmic solution    Sig: Place 1 drop into both eyes 2 (two) times daily.    Dispense:  15 mL    Refill:  3    Order Specific Question:  Supervising Provider    Answer:  Arlis Porta [id number]  . metroNIDAZOLE (FLAGYL) 500 MG tablet    Sig: Take 1 tablet (500 mg total) by mouth 2 (two) times daily.    Dispense:  21 tablet    Refill:  0    Order Specific Question:  Supervising Provider    Answer:  Arlis Porta [id number]      Follow up plan: Return in about 4 weeks (around 10/06/2015) for pain renewal.

## 2015-09-08 NOTE — Patient Instructions (Addendum)
Allergic Rhinitis Allergic rhinitis is when the mucous membranes in the nose respond to allergens. Allergens are particles in the air that cause your body to have an allergic reaction. This causes you to release allergic antibodies. Through a chain of events, these eventually cause you to release histamine into the blood stream. Although meant to protect the body, it is this release of histamine that causes your discomfort, such as frequent sneezing, congestion, and an itchy, runny nose.  CAUSES Seasonal allergic rhinitis (hay fever) is caused by pollen allergens that may come from grasses, trees, and weeds. Year-round allergic rhinitis (perennial allergic rhinitis) is caused by allergens such as house dust mites, pet dander, and mold spores. SYMPTOMS  Nasal stuffiness (congestion).  Itchy, runny nose with sneezing and tearing of the eyes. DIAGNOSIS Your health care provider can help you determine the allergen or allergens that trigger your symptoms. If you and your health care provider are unable to determine the allergen, skin or blood testing may be used. Your health care provider will diagnose your condition after taking your health history and performing a physical exam. Your health care provider may assess you for other related conditions, such as asthma, pink eye, or an ear infection. TREATMENT Allergic rhinitis does not have a cure, but it can be controlled by:  Medicines that block allergy symptoms. These may include allergy shots, nasal sprays, and oral antihistamines.  Avoiding the allergen. Hay fever may often be treated with antihistamines in pill or nasal spray forms. Antihistamines block the effects of histamine. There are over-the-counter medicines that may help with nasal congestion and swelling around the eyes. Check with your health care provider before taking or giving this medicine. If avoiding the allergen or the medicine prescribed do not work, there are many new medicines  your health care provider can prescribe. Stronger medicine may be used if initial measures are ineffective. Desensitizing injections can be used if medicine and avoidance does not work. Desensitization is when a patient is given ongoing shots until the body becomes less sensitive to the allergen. Make sure you follow up with your health care provider if problems continue. HOME CARE INSTRUCTIONS It is not possible to completely avoid allergens, but you can reduce your symptoms by taking steps to limit your exposure to them. It helps to know exactly what you are allergic to so that you can avoid your specific triggers. SEEK MEDICAL CARE IF:  You have a fever.  You develop a cough that does not stop easily (persistent).  You have shortness of breath.  You start wheezing.  Symptoms interfere with normal daily activities.   This information is not intended to replace advice given to you by your health care provider. Make sure you discuss any questions you have with your health care provider.   Document Released: 05/17/2001 Document Revised: 09/12/2014 Document Reviewed: 04/29/2013 Elsevier Interactive Patient Education 2016 Elsevier Inc.  

## 2015-09-08 NOTE — Assessment & Plan Note (Addendum)
Drug tested today. Pt is aware that she cannot have any further prescriptions from her previous provider. Due to issue with the pharmacy we will fill today since I personally spoke with pharmacy and it appears they filled the wrong prescription.  My prescription from 12/1 was cancelled. However if her prescriptions do not match CSRS again she will no longer receive pain medication from this office.  Awaiting chronic pain referral.

## 2015-09-11 LAB — TOXASSURE SELECT 13 (MW), URINE: PDF: 0

## 2015-09-30 ENCOUNTER — Other Ambulatory Visit: Payer: Self-pay | Admitting: Family Medicine

## 2015-09-30 DIAGNOSIS — M545 Low back pain, unspecified: Secondary | ICD-10-CM

## 2015-09-30 DIAGNOSIS — L4050 Arthropathic psoriasis, unspecified: Secondary | ICD-10-CM

## 2015-10-05 ENCOUNTER — Ambulatory Visit (INDEPENDENT_AMBULATORY_CARE_PROVIDER_SITE_OTHER): Payer: Commercial Managed Care - PPO | Admitting: Family Medicine

## 2015-10-05 VITALS — BP 150/90 | HR 75 | Temp 97.5°F | Resp 16 | Ht 70.0 in | Wt 170.0 lb

## 2015-10-05 DIAGNOSIS — G8929 Other chronic pain: Secondary | ICD-10-CM | POA: Diagnosis not present

## 2015-10-05 DIAGNOSIS — F1120 Opioid dependence, uncomplicated: Secondary | ICD-10-CM | POA: Diagnosis not present

## 2015-10-05 DIAGNOSIS — Z0289 Encounter for other administrative examinations: Secondary | ICD-10-CM | POA: Diagnosis not present

## 2015-10-05 DIAGNOSIS — Z79891 Long term (current) use of opiate analgesic: Secondary | ICD-10-CM

## 2015-10-05 DIAGNOSIS — N6452 Nipple discharge: Secondary | ICD-10-CM

## 2015-10-05 DIAGNOSIS — L4050 Arthropathic psoriasis, unspecified: Secondary | ICD-10-CM

## 2015-10-05 MED ORDER — OXYCODONE-ACETAMINOPHEN 10-325 MG PO TABS: 1.0000 | ORAL_TABLET | ORAL | Status: DC | PRN

## 2015-10-05 MED ORDER — OXYCODONE HCL ER 30 MG PO T12A: 1.0000 | EXTENDED_RELEASE_TABLET | Freq: Two times a day (BID) | ORAL | Status: DC

## 2015-10-05 NOTE — Patient Instructions (Signed)
We will get a breast ultrasound and a diagnostic mammogram of your breasts for the discharge.  We will contact you to schedule it at [place].

## 2015-10-05 NOTE — Assessment & Plan Note (Signed)
Pain management has contacted her to schedule an appt. Renewed medications today. Pt is compliant with opioid use agreement.  Refills are consistent with [place] CSRS database.

## 2015-10-05 NOTE — Assessment & Plan Note (Signed)
Urine drug screen today. Previous urine tox screens have been positive for opioids only.   Per [place] she can refill on 2/3.  Pt is aware.  Obtaining from same pharmacy each time.  Awaiting chronic pain referral.

## 2015-10-05 NOTE — Progress Notes (Signed)
Subjective:    Patient ID: Krista Kennedy, female    DOB: [birth year], 50 y.o.   MRN: [id number]  HPI: Krista Kennedy is a 50 y.o. female presenting on 10/05/2015 for Medication Refill   HPI  Pt presents for chronic pain refill today. She is taking oxycontin BID for chronic pain related to psoratic arthritis Taking percocets 4 times daily. She has been on this regimen x 3 years.  She is awaiting chronic pain. No constipation.Taking mobic daily for pain. Pain is 2/10 on medication. She recently started a new job and is very fatigued at work. Sore after work. Pain is 6/10. Pain is located in L hip, lower back.    Pt also reporting green discharge from the R breast. Started 2 weeks ago. No fevers. No Chills. Found on BSE. No redness in breast. No lump or bump. She has had a similar issue in the past- she has a L breast abscess that was removed in 2011   Past Medical History  Diagnosis Date  . Cardiomyopathy ([place])   . Hyperlipidemia   . Hypertension   . Thyroid disease   . Psoriasis     Current Outpatient Prescriptions on File Prior to Visit  Medication Sig  . Ascorbic Acid (VITAMIN C) 1000 MG tablet Take 1,000 mg by mouth daily.  . carvedilol (COREG) 25 MG tablet Take 1 tablet (25 mg total) by mouth 2 (two) times daily with a meal.  . Cholecalciferol (VITAMIN D-1000 MAX ST) 1000 UNITS tablet Take by mouth.  . clindamycin (CLEOCIN) 150 MG capsule Take 1 capsule (150 mg total) by mouth daily.  . clobetasol (TEMOVATE) 0.05 % external solution Apply topically 2 (two) times daily.  . digoxin (LANOXIN) 0.25 MG tablet Take 1 tablet (0.25 mg total) by mouth daily.  . fluticasone (FLONASE) 50 MCG/ACT nasal spray Place 2 sprays into both nostrils daily.  . furosemide (LASIX) 40 MG tablet Take 40 mg by mouth.  . levothyroxine (SYNTHROID, LEVOTHROID) 137 MCG tablet Take 1 tablet (137 mcg total) by mouth daily before breakfast.  . lisinopril (PRINIVIL,ZESTRIL) 5 MG tablet Take 1 tablet (5 mg total)  by mouth daily.  . meloxicam (MOBIC) 7.5 MG tablet Take by mouth.  . metroNIDAZOLE (FLAGYL) 500 MG tablet Take 1 tablet (500 mg total) by mouth 2 (two) times daily.  Marland Kitchen olopatadine (PATANOL) 0.1 % ophthalmic solution Place 1 drop into both eyes 2 (two) times daily.  Marland Kitchen Ustekinumab (STELARA) 45 MG/0.5ML SOLN Inject into the skin every 3 (three) months.  . venlafaxine XR (EFFEXOR-XR) 75 MG 24 hr capsule Take 1 capsule (75 mg total) by mouth daily with breakfast.   No current facility-administered medications on file prior to visit.    Review of Systems  Constitutional: Negative for fever and chills.  HENT: Negative.   Respiratory: Negative for cough, chest tightness and wheezing.   Cardiovascular: Negative for chest pain and leg swelling.  Gastrointestinal: Negative for nausea, vomiting, abdominal pain, diarrhea and constipation.  Endocrine: Negative.  Negative for cold intolerance, heat intolerance, polydipsia, polyphagia and polyuria.  Genitourinary: Negative for dysuria and difficulty urinating.  Musculoskeletal: Positive for back pain, joint swelling and arthralgias.  Neurological: Negative for dizziness, light-headedness and numbness.  Psychiatric/Behavioral: Negative.    Per HPI unless specifically indicated above     Objective:    BP 150/90 mmHg  Pulse 75  Temp(Src) 97.5 F (36.4 C) (Oral)  Resp 16  Ht 5\' 10"  (1.778 m)  Wt 170 lb (77.111 kg)  BMI 24.39 kg/m2  Wt Readings from Last 3 Encounters:  10/05/15 170 lb (77.111 kg)  09/08/15 169 lb 9.6 oz (76.93 kg)  08/06/15 172 lb 12.8 oz (78.382 kg)    Physical Exam  Constitutional: She is oriented to person, place, and time. She appears well-developed and well-nourished.  HENT:  Head: Normocephalic and atraumatic.  Neck: Neck supple.  Cardiovascular: Normal rate, regular rhythm and normal heart sounds.  Exam reveals no gallop and no friction rub.   No murmur heard. Pulmonary/Chest: Effort normal and breath sounds normal.  She has no wheezes. She exhibits no tenderness. Right breast exhibits nipple discharge (milly/ purulent discharge.). Right breast exhibits no inverted nipple, no mass, no skin change and no tenderness. Left breast exhibits no inverted nipple, no nipple discharge, no skin change and no tenderness.  Abdominal: Soft. Normal appearance and bowel sounds are normal. She exhibits no distension and no mass. There is no tenderness. There is no rebound and no guarding.  Musculoskeletal: Normal range of motion. She exhibits no edema or tenderness.  Lymphadenopathy:    She has no cervical adenopathy.  Neurological: She is alert and oriented to person, place, and time.  Skin: Skin is warm and dry.   Results for orders placed or performed in visit on 09/08/15  ToxASSURE Select 81 (MW), Urine  Result Value Ref Range   Report Summary FINAL    PDF .       Assessment & Plan:   Problem List Items Addressed This Visit      Musculoskeletal and Integument   Psoriasis with arthropathy (HCC)   Relevant Medications   oxyCODONE 30 MG 12 hr tablet   oxyCODONE-acetaminophen (PERCOCET) 10-325 MG tablet     Other   Opioid use agreement exists - Primary    Urine drug screen today. Previous urine tox screens have been positive for opioids only.   Per [place] [facility] she can refill on 2/3.  Pt is aware.  Obtaining from same pharmacy each time.  Awaiting chronic pain referral.       Relevant Medications   oxyCODONE 30 MG 12 hr tablet   Other Relevant Orders   ToxASSURE Select 13 (MW), Urine   Chronic pain    Pain management has contacted her to schedule an appt. Renewed medications today. Pt is compliant with opioid use agreement.  Refills are consistent with [place] CSRS database.       Relevant Medications   oxyCODONE 30 MG 12 hr tablet   oxyCODONE-acetaminophen (PERCOCET) 10-325 MG tablet   Other Relevant Orders   ToxASSURE Select 13 (MW), Urine    Other Visit Diagnoses    Nipple discharge in female         Imaging scheduled at [facility] to determine source of discharge. Consider prolactin testing if unable to find abscess or mass.     Relevant Orders    US BREAST LTD UNI RIGHT INC AXILLA    MM Digital Diagnostic Bilat       Meds ordered this encounter  Medications  . oxyCODONE 30 MG 12 hr tablet    Sig: Take 1 tablet by mouth every 12 (twelve) hours.    Dispense:  60 each    Refill:  0    Order Specific Question:  Supervising Provider    Answer:  Arlis Porta [phone]  . oxyCODONE-acetaminophen (PERCOCET) 10-325 MG tablet    Sig: Take 1 tablet by mouth every 4 (four) hours as needed for pain.  Dispense:  180 tablet    Refill:  0    Order Specific Question:  Supervising Provider    Answer:  Arlis Porta [id number]      Follow up plan: Return in about 4 weeks (around 11/02/2015), or if symptoms worsen or fail to improve.

## 2015-10-08 ENCOUNTER — Other Ambulatory Visit: Payer: Self-pay | Admitting: Family Medicine

## 2015-10-08 DIAGNOSIS — N6452 Nipple discharge: Secondary | ICD-10-CM

## 2015-10-09 LAB — TOXASSURE SELECT 13 (MW), URINE: PDF: 0

## 2015-10-13 ENCOUNTER — Other Ambulatory Visit: Payer: Self-pay | Admitting: Family Medicine

## 2015-10-13 DIAGNOSIS — N6452 Nipple discharge: Secondary | ICD-10-CM

## 2015-10-21 ENCOUNTER — Other Ambulatory Visit: Payer: Self-pay | Admitting: Family Medicine

## 2015-11-04 ENCOUNTER — Ambulatory Visit (INDEPENDENT_AMBULATORY_CARE_PROVIDER_SITE_OTHER): Payer: Commercial Managed Care - PPO | Admitting: Family Medicine

## 2015-11-04 VITALS — BP 124/82 | HR 85 | Temp 98.3°F | Resp 16 | Ht 70.0 in | Wt 165.6 lb

## 2015-11-04 DIAGNOSIS — Z79891 Long term (current) use of opiate analgesic: Secondary | ICD-10-CM

## 2015-11-04 DIAGNOSIS — I10 Essential (primary) hypertension: Secondary | ICD-10-CM

## 2015-11-04 DIAGNOSIS — E559 Vitamin D deficiency, unspecified: Secondary | ICD-10-CM | POA: Diagnosis not present

## 2015-11-04 DIAGNOSIS — Z0289 Encounter for other administrative examinations: Secondary | ICD-10-CM

## 2015-11-04 DIAGNOSIS — I5022 Chronic systolic (congestive) heart failure: Secondary | ICD-10-CM

## 2015-11-04 DIAGNOSIS — E034 Atrophy of thyroid (acquired): Secondary | ICD-10-CM

## 2015-11-04 DIAGNOSIS — E038 Other specified hypothyroidism: Secondary | ICD-10-CM

## 2015-11-04 DIAGNOSIS — G8929 Other chronic pain: Secondary | ICD-10-CM

## 2015-11-04 DIAGNOSIS — L4050 Arthropathic psoriasis, unspecified: Secondary | ICD-10-CM | POA: Diagnosis not present

## 2015-11-04 MED ORDER — OXYCODONE-ACETAMINOPHEN 10-325 MG PO TABS: 1.0000 | ORAL_TABLET | ORAL | Status: DC | PRN

## 2015-11-04 MED ORDER — OXYCODONE HCL ER 30 MG PO T12A: 1.0000 | EXTENDED_RELEASE_TABLET | Freq: Two times a day (BID) | ORAL | Status: DC

## 2015-11-04 NOTE — Assessment & Plan Note (Signed)
Check CMET. Appears controlled upon recheck today.

## 2015-11-04 NOTE — Assessment & Plan Note (Addendum)
Pt reports stopping stelara for brief period. Rheumatology did not recommend any changes to her regimen.  She does not feel it is helping. Pt continues with narcotics for joint pain.  She needs to schedule with chronic pain to resume management of her pain medications.

## 2015-11-04 NOTE — Assessment & Plan Note (Signed)
Pt compliant with opioid use agreement. Pt instructed to schedule with chronic pain ASAP.

## 2015-11-04 NOTE — Assessment & Plan Note (Signed)
Urine drug screens positive only for oxycodone. Covering chronic pain medications until she can be seen by chronic pain. Pt encouraged to schedule with chronic pain ASAP to complete referral. Fill dates match prescribing in [place]. Pt aware she can fill on 2/4.

## 2015-11-04 NOTE — Patient Instructions (Signed)
Please keep an eye on your blood pressure. It was elevated initially but went down in the office.  Please schedule with the [facility] as soon as you can as their appts fill quickly.

## 2015-11-04 NOTE — Progress Notes (Signed)
Subjective:    Patient ID: Krista Kennedy, female    DOB: 1966/06/02, 50 y.o.   MRN: [id number]  HPI: Krista Kennedy is a 50 y.o. female presenting on 11/04/2015 for Medication Refill   HPI  Pt presents for refill of her pain medication for psoriatic arthritis. Pain is located in the L hip. Bilateral knees. Has started working and walking more. With walking the hip has felt better. Occasional constipation from opioids. She has tried to using solely NSAIDs in the past without complete resolution of pain. Currently taking meloxicam with some help for her knees.  She has yet to schedule a visit with chronic pain. She did not know her schedule.  Per [place] CSRS database her fill dates were 10/06/2015 and 10/10/2015 respectively. She is receiving narcotics from no other provider. Pain contract is signed. All previous drug screens have shown only what she is taking.    Pt is due for labwork in near future. Requesting labslip today. Wil check TSH, CMET, vitamin D, digoxin level, and lipids.   Past Medical History  Diagnosis Date  . Cardiomyopathy ([place])   . Hyperlipidemia   . Hypertension   . Thyroid disease   . Psoriasis     Current Outpatient Prescriptions on File Prior to Visit  Medication Sig  . Ascorbic Acid (VITAMIN C) 1000 MG tablet Take 1,000 mg by mouth daily.  . carvedilol (COREG) 25 MG tablet Take 1 tablet (25 mg total) by mouth 2 (two) times daily with a meal.  . Cholecalciferol (VITAMIN D-1000 MAX ST) 1000 UNITS tablet Take by mouth.  . clindamycin (CLEOCIN) 150 MG capsule Take 1 capsule (150 mg total) by mouth daily.  . clobetasol (TEMOVATE) 0.05 % external solution Apply topically 2 (two) times daily.  . digoxin (LANOXIN) 0.25 MG tablet Take 1 tablet (0.25 mg total) by mouth daily.  . fluticasone (FLONASE) 50 MCG/ACT nasal spray Place 2 sprays into both nostrils daily.  . furosemide (LASIX) 40 MG tablet Take 40 mg by mouth.  . levothyroxine (SYNTHROID, LEVOTHROID) 137 MCG tablet Take  1 tablet (137 mcg total) by mouth daily before breakfast.  . lisinopril (PRINIVIL,ZESTRIL) 5 MG tablet Take 1 tablet (5 mg total) by mouth daily.  . meloxicam (MOBIC) 7.5 MG tablet Take by mouth.  . metroNIDAZOLE (FLAGYL) 500 MG tablet Take 1 tablet (500 mg total) by mouth 2 (two) times daily.  Marland Kitchen olopatadine (PATANOL) 0.1 % ophthalmic solution Place 1 drop into both eyes 2 (two) times daily.  Marland Kitchen Ustekinumab (STELARA) 45 MG/0.5ML SOLN Inject into the skin every 3 (three) months.  . venlafaxine XR (EFFEXOR-XR) 75 MG 24 hr capsule Take 1 capsule (75 mg total) by mouth daily with breakfast.   No current facility-administered medications on file prior to visit.    Review of Systems  Constitutional: Negative for fever and chills.  HENT: Negative.   Respiratory: Negative for cough, chest tightness and wheezing.   Cardiovascular: Negative for chest pain and leg swelling.  Gastrointestinal: Negative for nausea, vomiting, abdominal pain, diarrhea and constipation.  Endocrine: Negative.  Negative for cold intolerance, heat intolerance, polydipsia, polyphagia and polyuria.  Genitourinary: Negative for dysuria and difficulty urinating.  Musculoskeletal: Positive for joint swelling and arthralgias. Negative for neck pain and neck stiffness.  Skin: Positive for rash.  Neurological: Negative for dizziness, light-headedness and numbness.  Psychiatric/Behavioral: Negative.    Per HPI unless specifically indicated above     Objective:    BP 124/82 mmHg  Pulse 85  Temp(Src)  98.3 F (36.8 C) (Oral)  Resp 16  Ht 5\' 10"  (1.778 m)  Wt 165 lb 9.6 oz (75.116 kg)  BMI 23.76 kg/m2  Wt Readings from Last 3 Encounters:  11/04/15 165 lb 9.6 oz (75.116 kg)  10/05/15 170 lb (77.111 kg)  09/08/15 169 lb 9.6 oz (76.93 kg)    Physical Exam  Constitutional: She is oriented to person, place, and time. She appears well-developed and well-nourished.  HENT:  Head: Normocephalic and atraumatic.  Neck: Neck  supple.  Cardiovascular: Normal rate, regular rhythm and normal heart sounds.  Exam reveals no gallop and no friction rub.   No murmur heard. Pulmonary/Chest: Effort normal and breath sounds normal. She has no wheezes. She exhibits no tenderness.  Abdominal: Soft. Normal appearance and bowel sounds are normal. She exhibits no distension and no mass. There is no tenderness. There is no rebound and no guarding.  Musculoskeletal: Normal range of motion. She exhibits no edema or tenderness.  Lymphadenopathy:    She has no cervical adenopathy.  Neurological: She is alert and oriented to person, place, and time.  Skin: Skin is warm and dry. Rash noted. Rash is macular.  Psoriatic lesions on face and hands.    Results for orders placed or performed in visit on 10/05/15  ToxASSURE Select 69 (MW), Urine  Result Value Ref Range   Report Summary FINAL    PDF .       Assessment & Plan:   Problem List Items Addressed This Visit      Cardiovascular and Mediastinum   Chronic systolic congestive heart failure ([place]) - Primary    Check digoxin level.       Relevant Orders   Comprehensive metabolic panel   Digoxin level   Lipid panel   Essential hypertension    Check CMET. Appears controlled upon recheck today.         Endocrine   Adult hypothyroidism    Lab slip to check TSH given today.       Relevant Orders   TSH     Musculoskeletal and Integument   Psoriasis with arthropathy ([place])    Pt reports stopping stelara for brief period. Rheumatology did not recommend any changes to her regimen.  She does not feel it is helping. Pt continues with narcotics for joint pain.  She needs to schedule with chronic pain to resume management of her pain medications.       Relevant Medications   oxyCODONE 30 MG 12 hr tablet   oxyCODONE-acetaminophen (PERCOCET) 10-325 MG tablet     Other   Avitaminosis D    Check Vitamin D today.       Relevant Orders   VITAMIN D 25 Hydroxy (Vit-D  Deficiency, Fractures)   Opioid use agreement exists    Urine drug screens positive only for oxycodone. Covering chronic pain medications until she can be seen by chronic pain. Pt encouraged to schedule with chronic pain ASAP to complete referral. Fill dates match prescribing in [place]. Pt aware she can fill on 2/4.       Relevant Medications   oxyCODONE 30 MG 12 hr tablet   Other Relevant Orders   ToxASSURE Select 13 (MW), Urine   Chronic pain    Pt compliant with opioid use agreement. Pt instructed to schedule with chronic pain ASAP.       Relevant Medications   oxyCODONE 30 MG 12 hr tablet   oxyCODONE-acetaminophen (PERCOCET) 10-325 MG tablet   Other Relevant Orders  ToxASSURE Select 13 (MW), Urine      Meds ordered this encounter  Medications  . oxyCODONE 30 MG 12 hr tablet    Sig: Take 1 tablet by mouth every 12 (twelve) hours.    Dispense:  60 each    Refill:  0    Order Specific Question:  Supervising Provider    Answer:  Arlis Porta [phone]  . oxyCODONE-acetaminophen (PERCOCET) 10-325 MG tablet    Sig: Take 1 tablet by mouth every 4 (four) hours as needed for pain.    Dispense:  180 tablet    Refill:  0    Order Specific Question:  Supervising Provider    Answer:  Arlis Porta [id number]      Follow up plan: Return in about 4 weeks (around 12/02/2015) for pain meds. Marland Kitchen

## 2015-11-04 NOTE — Assessment & Plan Note (Signed)
Check digoxin level 

## 2015-11-04 NOTE — Assessment & Plan Note (Signed)
Check Vitamin D today 

## 2015-11-04 NOTE — Assessment & Plan Note (Signed)
Lab slip to check TSH given today.

## 2015-11-10 LAB — TOXASSURE SELECT 13 (MW), URINE: PDF: 0

## 2015-11-27 ENCOUNTER — Other Ambulatory Visit: Payer: Self-pay | Admitting: Family Medicine

## 2015-12-01 ENCOUNTER — Encounter: Payer: Self-pay | Admitting: *Deleted

## 2015-12-01 ENCOUNTER — Other Ambulatory Visit: Payer: Self-pay | Admitting: Family Medicine

## 2015-12-01 DIAGNOSIS — N6452 Nipple discharge: Secondary | ICD-10-CM

## 2015-12-02 ENCOUNTER — Telehealth: Payer: Self-pay | Admitting: Family Medicine

## 2015-12-02 NOTE — Telephone Encounter (Signed)
Pt.went to [facility] today to have  Labs was told that she had outstanding balance states that when she had lab for  [facility]  Ins denied clm states it was not necessary she wanted to know if you can do something  So insurance will pay clms.  (she owes in th $1000.00 range to [place]) Pt call back # is  [phone]

## 2015-12-03 NOTE — Telephone Encounter (Signed)
This would be an issue for [facility] to address. Does she have a bill from [place]? That helps [place] determine what we can do. I will route this to [facility] to put on her to do list next time she is in the office. Thanks!

## 2015-12-03 NOTE — Telephone Encounter (Signed)
R/T call to patient and left detailed message as stated below.

## 2015-12-07 ENCOUNTER — Ambulatory Visit (INDEPENDENT_AMBULATORY_CARE_PROVIDER_SITE_OTHER): Payer: Commercial Managed Care - PPO | Admitting: Family Medicine

## 2015-12-07 VITALS — BP 142/78 | HR 78 | Temp 97.8°F | Resp 16 | Ht 70.0 in | Wt 164.0 lb

## 2015-12-07 DIAGNOSIS — Z0289 Encounter for other administrative examinations: Secondary | ICD-10-CM | POA: Diagnosis not present

## 2015-12-07 DIAGNOSIS — L4050 Arthropathic psoriasis, unspecified: Secondary | ICD-10-CM | POA: Diagnosis not present

## 2015-12-07 DIAGNOSIS — G8929 Other chronic pain: Secondary | ICD-10-CM

## 2015-12-07 DIAGNOSIS — Z23 Encounter for immunization: Secondary | ICD-10-CM

## 2015-12-07 DIAGNOSIS — Z79891 Long term (current) use of opiate analgesic: Secondary | ICD-10-CM

## 2015-12-07 MED ORDER — OXYCODONE HCL ER 30 MG PO T12A: 1.0000 | EXTENDED_RELEASE_TABLET | Freq: Two times a day (BID) | ORAL | Status: DC

## 2015-12-07 MED ORDER — OXYCODONE-ACETAMINOPHEN 10-325 MG PO TABS: 1.0000 | ORAL_TABLET | ORAL | Status: DC | PRN

## 2015-12-07 NOTE — Assessment & Plan Note (Signed)
TB test placed for patient to start new biologic for psorasis as managed by [facility].

## 2015-12-07 NOTE — Progress Notes (Signed)
Subjective:    Patient ID: Krista Kennedy, female    DOB: [birth year], 50 y.o.   MRN: [id number]  HPI: Krista Kennedy is a 50 y.o. female presenting on 12/07/2015 for Hypertension   HPI   Pt presents for recheck of chronic pain associated with Psoratic Arthritis. Pain is located in L hip, spine and bilateral knees.She has been on this regimen >1 years as started by her previous PCP.  Still taking percocet every 4 hours, 1 oxycontin every 12 hours. Taking 7.5mg  of mobic.She has failed only NSAIDs in the past.  She doesn't feel the mobic is helping. Is awaiting chronic pain- has appt on May 2017. Pain contract is signed. She has been compliant with treatment plan.  Occasional constipation from pain medication.  Previous drug screens have shown only oxycodone.  Walks at work-  Both knees very painful when active. Tried heating pad for the hip. Not tried.  No regular exercise.    Requesting a TB test for her new psorasis medication.   Takes BP medications between 1 and 2 and skipped them today. Avg 130-140/80 at home. Low of 111/80.   Past Medical History  Diagnosis Date  . Cardiomyopathy ([place])   . Hyperlipidemia   . Hypertension   . Thyroid disease   . Psoriasis     Current Outpatient Prescriptions on File Prior to Visit  Medication Sig  . Ascorbic Acid (VITAMIN C) 1000 MG tablet Take 1,000 mg by mouth daily.  . carvedilol (COREG) 25 MG tablet Take 1 tablet (25 mg total) by mouth 2 (two) times daily with a meal.  . Cholecalciferol (VITAMIN D-1000 MAX ST) 1000 UNITS tablet Take by mouth.  . clindamycin (CLEOCIN) 150 MG capsule Take 1 capsule (150 mg total) by mouth daily.  . clobetasol (TEMOVATE) 0.05 % external solution Apply topically 2 (two) times daily.  . digoxin (LANOXIN) 0.25 MG tablet Take 1 tablet (0.25 mg total) by mouth daily.  . fluticasone (FLONASE) 50 MCG/ACT nasal spray Place 2 sprays into both nostrils daily.  . furosemide (LASIX) 40 MG tablet Take 40 mg by mouth.  .  levothyroxine (SYNTHROID, LEVOTHROID) 137 MCG tablet Take 1 tablet (137 mcg total) by mouth daily before breakfast.  . lisinopril (PRINIVIL,ZESTRIL) 5 MG tablet Take 1 tablet (5 mg total) by mouth daily.  . meloxicam (MOBIC) 7.5 MG tablet Take by mouth.  . metroNIDAZOLE (FLAGYL) 500 MG tablet Take 1 tablet (500 mg total) by mouth 2 (two) times daily.  Marland Kitchen olopatadine (PATANOL) 0.1 % ophthalmic solution Place 1 drop into both eyes 2 (two) times daily.  Marland Kitchen Ustekinumab (STELARA) 45 MG/0.5ML SOLN Inject into the skin every 3 (three) months.  . venlafaxine XR (EFFEXOR-XR) 75 MG 24 hr capsule Take 1 capsule (75 mg total) by mouth daily with breakfast.   No current facility-administered medications on file prior to visit.    Review of Systems  Constitutional: Negative for fever and chills.  HENT: Negative.   Respiratory: Negative for cough, chest tightness and wheezing.   Cardiovascular: Negative for chest pain and leg swelling.  Gastrointestinal: Negative for nausea, vomiting, abdominal pain, diarrhea and constipation.  Endocrine: Negative.  Negative for cold intolerance, heat intolerance, polydipsia, polyphagia and polyuria.  Genitourinary: Negative for dysuria and difficulty urinating.  Musculoskeletal: Positive for back pain and arthralgias.  Skin: Positive for rash (psorasis).  Neurological: Negative for dizziness, light-headedness and numbness.  Psychiatric/Behavioral: Negative.    Per HPI unless specifically indicated above     Objective:  BP 142/78 mmHg  Pulse 78  Temp(Src) 97.8 F (36.6 C) (Oral)  Resp 16  Ht 5\' 10"  (1.778 m)  Wt 164 lb (74.39 kg)  BMI 23.53 kg/m2  Wt Readings from Last 3 Encounters:  12/07/15 164 lb (74.39 kg)  11/04/15 165 lb 9.6 oz (75.116 kg)  10/05/15 170 lb (77.111 kg)    Physical Exam  Constitutional: She is oriented to person, place, and time. She appears well-developed and well-nourished.  HENT:  Head: Normocephalic and atraumatic.  Neck:  Normal range of motion. Neck supple.  Cardiovascular: Normal rate, regular rhythm and normal heart sounds.  Exam reveals no gallop and no friction rub.   No murmur heard. Pulmonary/Chest: Effort normal and breath sounds normal. She has no wheezes. She exhibits no tenderness.  Abdominal: Soft. Normal appearance and bowel sounds are normal.  Musculoskeletal: Normal range of motion. She exhibits no edema.       Right knee: She exhibits normal range of motion, no swelling, no LCL laxity and no MCL laxity. Tenderness found. Medial joint line and lateral joint line tenderness noted.       Left knee: She exhibits normal range of motion, no swelling, no erythema, no LCL laxity, normal meniscus and no MCL laxity. Tenderness found. Medial joint line and lateral joint line tenderness noted.  Neurological: She is alert and oriented to person, place, and time.  Skin: Skin is warm and dry. Rash noted.   Results for orders placed or performed in visit on 11/04/15  ToxASSURE Select 33 (MW), Urine  Result Value Ref Range   Report Summary FINAL    PDF .       Assessment & Plan:   Problem List Items Addressed This Visit      Musculoskeletal and Integument   Psoriasis with arthropathy ([place])    TB test placed for patient to start new biologic for psorasis as managed by [facility].       Relevant Medications   oxyCODONE 30 MG 12 hr tablet   oxyCODONE-acetaminophen (PERCOCET) 10-325 MG tablet   Other Relevant Orders   PPD (Completed)     Other   Opioid use agreement exists - Primary    Fill dates match up to prescribing. Filling at same pharmacy each time. Pt has appt with Chronic pain.       Relevant Medications   oxyCODONE 30 MG 12 hr tablet   Chronic pain    Renewed pain medications today. Pt will not fill oxycontin until 12/12/2015. No drug screen today. Chronic pain appt in May.  Encouraged daily exercise and non-pharmcologic treatment to help manage pain.       Relevant  Medications   oxyCODONE 30 MG 12 hr tablet   oxyCODONE-acetaminophen (PERCOCET) 10-325 MG tablet    Other Visit Diagnoses    Need for tuberculosis vaccination        PPD placed today. Should be read by 4/6 at 1500.     Relevant Orders    PPD (Completed)       Meds ordered this encounter  Medications  . oxyCODONE 30 MG 12 hr tablet    Sig: Take 1 tablet by mouth every 12 (twelve) hours.    Dispense:  60 each    Refill:  0    Order Specific Question:  Supervising Provider    Answer:  Arlis Porta [phone]  . oxyCODONE-acetaminophen (PERCOCET) 10-325 MG tablet    Sig: Take 1 tablet by mouth every 4 (four) hours  as needed for pain.    Dispense:  180 tablet    Refill:  0    Order Specific Question:  Supervising Provider    Answer:  Arlis Porta [id number]      Follow up plan: Return in about 4 weeks (around 01/04/2016).

## 2015-12-07 NOTE — Assessment & Plan Note (Signed)
Fill dates match up to prescribing. Filling at same pharmacy each time. Pt has appt with Chronic pain.

## 2015-12-07 NOTE — Patient Instructions (Signed)
Please have your PPD read by 3pm on Thursday.   Please check your blood pressure today after you take your medications to ensure it is <100/60 and <140/90.

## 2015-12-07 NOTE — Assessment & Plan Note (Signed)
Renewed pain medications today. Pt will not fill oxycontin until 12/12/2015. No drug screen today. Chronic pain appt in May.  Encouraged daily exercise and non-pharmcologic treatment to help manage pain.

## 2015-12-09 ENCOUNTER — Ambulatory Visit: Payer: Self-pay | Admitting: General Surgery

## 2015-12-09 ENCOUNTER — Other Ambulatory Visit: Payer: Self-pay | Admitting: Family Medicine

## 2015-12-09 ENCOUNTER — Telehealth: Payer: Self-pay | Admitting: Family Medicine

## 2015-12-09 LAB — TB SKIN TEST
INDURATION: 0 mm
TB SKIN TEST: NEGATIVE

## 2015-12-09 NOTE — Telephone Encounter (Signed)
Please fax pt's TB results to [facility] attn: Wannetta Sender per patient.  Her call back number [phone]

## 2015-12-10 ENCOUNTER — Telehealth: Payer: Self-pay | Admitting: *Deleted

## 2015-12-10 ENCOUNTER — Encounter: Payer: Self-pay | Admitting: *Deleted

## 2015-12-10 ENCOUNTER — Encounter: Payer: Self-pay | Admitting: Family Medicine

## 2015-12-10 NOTE — Telephone Encounter (Signed)
Faxed.[place]

## 2015-12-10 NOTE — Telephone Encounter (Signed)
Notified patient that [place] has changed icd-10 codes and asked that [facility] re-submit claim.Krista Kennedy

## 2015-12-31 ENCOUNTER — Telehealth: Payer: Self-pay | Admitting: Family Medicine

## 2015-12-31 NOTE — Telephone Encounter (Signed)
Pt. Called states that ins will not pay for any  Drug testing so pt have a bill for 1200.00 (on her credit  Report) wanted to you to know  [phone]

## 2015-12-31 NOTE — Telephone Encounter (Signed)
I am forwarding this message on to [place]. Thanks!

## 2016-01-08 ENCOUNTER — Encounter: Payer: Self-pay | Admitting: Family Medicine

## 2016-01-08 ENCOUNTER — Ambulatory Visit (INDEPENDENT_AMBULATORY_CARE_PROVIDER_SITE_OTHER): Payer: Commercial Managed Care - PPO | Admitting: Family Medicine

## 2016-01-08 VITALS — BP 136/72 | HR 77 | Temp 98.5°F | Resp 16 | Ht 70.0 in | Wt 165.0 lb

## 2016-01-08 DIAGNOSIS — G8929 Other chronic pain: Secondary | ICD-10-CM

## 2016-01-08 DIAGNOSIS — L4050 Arthropathic psoriasis, unspecified: Secondary | ICD-10-CM

## 2016-01-08 MED ORDER — OXYCODONE-ACETAMINOPHEN 10-325 MG PO TABS: 1.0000 | ORAL_TABLET | ORAL | Status: DC | PRN

## 2016-01-08 NOTE — Progress Notes (Signed)
Subjective:    Patient ID: Krista Kennedy, female    DOB: 12-10-1965, 50 y.o.   MRN: [id number]  HPI: Krista Kennedy is a 50 y.o. female presenting on 01/08/2016 for Medication Refill   HPI  Pt presents for medication refill for chronic opioid use.  She has been taking these medications for the past few years.  Was started by previous PCP. She has appt for chronic pain on Wednesday, May 10. Pt reports her "hips are killing her" pain is 3-4/10. Pain is located in hips and back today. Pain is related to psoratic arthritis affect SI joints.  Taking meloxicam anti-inflammatory- she states this makes her sleepy and does not help Fills dates per  CSRS of oxycontin 4/26- will not be filled today. She states the medication was filled last week due to overlap of prescription with previous provider that has pushed her fill dates forward.  Fill dates of oxycodone 4/3- will be filled today.  Drug tests have all been negative.   No constipation.     Past Medical History  Diagnosis Date  . Cardiomyopathy ([place])   . Hyperlipidemia   . Hypertension   . Thyroid disease   . Psoriasis     Current Outpatient Prescriptions on File Prior to Visit  Medication Sig  . Ascorbic Acid (VITAMIN C) 1000 MG tablet Take 1,000 mg by mouth daily.  . carvedilol (COREG) 25 MG tablet Take 1 tablet (25 mg total) by mouth 2 (two) times daily with a meal.  . Cholecalciferol (VITAMIN D-1000 MAX ST) 1000 UNITS tablet Take by mouth.  . clindamycin (CLEOCIN) 150 MG capsule Take 1 capsule (150 mg total) by mouth daily.  . clobetasol (TEMOVATE) 0.05 % external solution Apply topically 2 (two) times daily.  . digoxin (LANOXIN) 0.25 MG tablet Take 1 tablet (0.25 mg total) by mouth daily.  . fluticasone (FLONASE) 50 MCG/ACT nasal spray Place 2 sprays into both nostrils daily.  . furosemide (LASIX) 40 MG tablet Take 40 mg by mouth.  . levothyroxine (SYNTHROID, LEVOTHROID) 137 MCG tablet Take 1 tablet (137 mcg total) by mouth  daily before breakfast.  . lisinopril (PRINIVIL,ZESTRIL) 5 MG tablet Take 1 tablet (5 mg total) by mouth daily.  . meloxicam (MOBIC) 7.5 MG tablet Take by mouth.  . metroNIDAZOLE (FLAGYL) 500 MG tablet Take 1 tablet (500 mg total) by mouth 2 (two) times daily.  Marland Kitchen olopatadine (PATANOL) 0.1 % ophthalmic solution Place 1 drop into both eyes 2 (two) times daily.  Marland Kitchen oxyCODONE 30 MG 12 hr tablet Take 1 tablet by mouth every 12 (twelve) hours.  . Ustekinumab (STELARA) 45 MG/0.5ML SOLN Inject into the skin every 3 (three) months.  . venlafaxine XR (EFFEXOR-XR) 75 MG 24 hr capsule Take 1 capsule (75 mg total) by mouth daily with breakfast.   No current facility-administered medications on file prior to visit.    Review of Systems  Constitutional: Negative for fever and chills.  HENT: Negative.   Respiratory: Negative for cough, chest tightness and wheezing.   Cardiovascular: Negative for chest pain and leg swelling.  Gastrointestinal: Negative for nausea, vomiting, abdominal pain, diarrhea and constipation.  Endocrine: Negative.  Negative for cold intolerance, heat intolerance, polydipsia, polyphagia and polyuria.  Genitourinary: Negative for dysuria and difficulty urinating.  Musculoskeletal: Positive for back pain and arthralgias.  Neurological: Negative for dizziness, light-headedness and numbness.  Psychiatric/Behavioral: Negative.    Per HPI unless specifically indicated above     Objective:    BP 136/72 mmHg  Pulse 77  Temp(Src) 98.5 F (36.9 C) (Oral)  Resp 16  Ht 5\' 10"  (1.778 m)  Wt 165 lb (74.844 kg)  BMI 23.68 kg/m2  Wt Readings from Last 3 Encounters:  01/08/16 165 lb (74.844 kg)  12/07/15 164 lb (74.39 kg)  11/04/15 165 lb 9.6 oz (75.116 kg)    Physical Exam  Constitutional: She is oriented to person, place, and time. She appears well-developed and well-nourished.  HENT:  Head: Normocephalic and atraumatic.  Neck: Neck supple.  Cardiovascular: Normal rate, regular  rhythm and normal heart sounds.  Exam reveals no gallop and no friction rub.   No murmur heard. Pulmonary/Chest: Effort normal and breath sounds normal. She has no wheezes. She exhibits no tenderness.  Abdominal: Soft. Normal appearance and bowel sounds are normal. She exhibits no distension and no mass. There is no tenderness. There is no rebound and no guarding.  Musculoskeletal: Normal range of motion. She exhibits no edema or tenderness.  Lymphadenopathy:    She has no cervical adenopathy.  Neurological: She is alert and oriented to person, place, and time.  Skin: Skin is warm and dry.   Results for orders placed or performed in visit on 12/07/15  PPD  Result Value Ref Range   TB Skin Test Negative    Induration 0.0 mm      Assessment & Plan:   Problem List Items Addressed This Visit      Musculoskeletal and Integument   Psoriasis with arthropathy ([place]) - Primary    Continue current therapy for joint pain. Psorasis is mainly managed by dermatology at this time. Pt was seen by rheumatology but opted not to go back. She is in the process of being tested to start a new biologic for her psorasis.  Awaiting chronic pain management to determine if there are better medications to manage her joint pain.       Relevant Medications   oxyCODONE-acetaminophen (PERCOCET) 10-325 MG tablet     Other   Chronic pain    Percocet filled today. Too early to fill oxycontin per [place] CSRS. Pt has appt with chronic pain on Wednesday, May 10 to continue to get medications. Pt encouraged to keep this appt.        Relevant Medications   oxyCODONE-acetaminophen (PERCOCET) 10-325 MG tablet      Meds ordered this encounter  Medications  . oxyCODONE-acetaminophen (PERCOCET) 10-325 MG tablet    Sig: Take 1 tablet by mouth every 4 (four) hours as needed for pain.    Dispense:  180 tablet    Refill:  0    Order Specific Question:  Supervising Provider    Answer:  Arlis Porta [id number]       Follow up plan: Return in about 4 weeks (around 02/05/2016).

## 2016-01-08 NOTE — Assessment & Plan Note (Signed)
Percocet filled today. Too early to fill oxycontin per [place] CSRS. Pt has appt with chronic pain on Wednesday, May 10 to continue to get medications. Pt encouraged to keep this appt.

## 2016-01-08 NOTE — Assessment & Plan Note (Signed)
Continue current therapy for joint pain. Psorasis is mainly managed by dermatology at this time. Pt was seen by rheumatology but opted not to go back. She is in the process of being tested to start a new biologic for her psorasis.  Awaiting chronic pain management to determine if there are better medications to manage her joint pain.

## 2016-01-08 NOTE — Patient Instructions (Signed)
I have filled your oxycodone today. We can refill your oxycontin if needed at the end of the month.

## 2016-01-13 ENCOUNTER — Encounter: Payer: Self-pay | Admitting: Anesthesiology

## 2016-01-13 ENCOUNTER — Ambulatory Visit: Payer: Commercial Managed Care - PPO | Attending: Anesthesiology | Admitting: Anesthesiology

## 2016-01-13 VITALS — BP 167/81 | HR 73 | Temp 98.2°F | Resp 16 | Ht 70.0 in | Wt 163.0 lb

## 2016-01-13 DIAGNOSIS — M25549 Pain in joints of unspecified hand: Secondary | ICD-10-CM

## 2016-01-13 DIAGNOSIS — Z87891 Personal history of nicotine dependence: Secondary | ICD-10-CM | POA: Diagnosis not present

## 2016-01-13 DIAGNOSIS — M25551 Pain in right hip: Secondary | ICD-10-CM

## 2016-01-13 DIAGNOSIS — M79646 Pain in unspecified finger(s): Secondary | ICD-10-CM

## 2016-01-13 DIAGNOSIS — R52 Pain, unspecified: Secondary | ICD-10-CM | POA: Diagnosis present

## 2016-01-13 DIAGNOSIS — G894 Chronic pain syndrome: Secondary | ICD-10-CM | POA: Diagnosis not present

## 2016-01-13 DIAGNOSIS — E079 Disorder of thyroid, unspecified: Secondary | ICD-10-CM | POA: Diagnosis not present

## 2016-01-13 DIAGNOSIS — E785 Hyperlipidemia, unspecified: Secondary | ICD-10-CM | POA: Diagnosis not present

## 2016-01-13 DIAGNOSIS — I429 Cardiomyopathy, unspecified: Secondary | ICD-10-CM | POA: Diagnosis not present

## 2016-01-13 DIAGNOSIS — M25552 Pain in left hip: Secondary | ICD-10-CM | POA: Insufficient documentation

## 2016-01-13 DIAGNOSIS — L4050 Arthropathic psoriasis, unspecified: Secondary | ICD-10-CM | POA: Diagnosis not present

## 2016-01-13 DIAGNOSIS — I10 Essential (primary) hypertension: Secondary | ICD-10-CM | POA: Diagnosis not present

## 2016-01-13 NOTE — Progress Notes (Signed)
Safety precautions to be maintained throughout the outpatient stay will include: orient to surroundings, keep bed in low position, maintain call bell within reach at all times, provide assistance with transfer out of bed and ambulation.  

## 2016-01-15 ENCOUNTER — Telehealth: Payer: Self-pay | Admitting: Family Medicine

## 2016-01-15 NOTE — Telephone Encounter (Signed)
Done.[place] 

## 2016-01-15 NOTE — Telephone Encounter (Signed)
Krista Kennedy with [employer] needs office notes pt's visit on 10/05/15.  They are reviewing the claim and need to know the reason it was ordered.  Her call back number is [phone] and fax number is [phone]

## 2016-02-02 ENCOUNTER — Ambulatory Visit (INDEPENDENT_AMBULATORY_CARE_PROVIDER_SITE_OTHER): Payer: Commercial Managed Care - PPO | Admitting: Family Medicine

## 2016-02-02 DIAGNOSIS — G8929 Other chronic pain: Secondary | ICD-10-CM | POA: Diagnosis not present

## 2016-02-02 DIAGNOSIS — L4050 Arthropathic psoriasis, unspecified: Secondary | ICD-10-CM | POA: Diagnosis not present

## 2016-02-02 MED ORDER — OXYCODONE-ACETAMINOPHEN 10-325 MG PO TABS: 1.0000 | ORAL_TABLET | ORAL | Status: DC | PRN

## 2016-02-02 MED ORDER — OXYCODONE HCL ER 30 MG PO T12A: 1.0000 | EXTENDED_RELEASE_TABLET | Freq: Two times a day (BID) | ORAL | Status: DC

## 2016-02-02 NOTE — Patient Instructions (Signed)
Please continue to follow with chronic pain.    Please let myself or dermatology know if the site reaction continues in your thigh. Try heating pad, tylenol, and benadryl.

## 2016-02-02 NOTE — Progress Notes (Signed)
Subjective:    Patient ID: Krista Kennedy, female    DOB: 1965/11/03, 50 y.o.   MRN: [id number]  HPI: Krista Kennedy is a 50 y.o. female presenting on 02/02/2016 for Medication Refill   HPI  Pt presents for refill of her pain medication for psoriatic arthritis. Pain is located in the L hip. Bilateral knees. Has started working and walking more. Lower back is hurting worse. With walking the hip has felt better. Occasional constipation from opioids. She has tried to using solely NSAIDs in the past without complete resolution of pain. Currently taking meloxicam with some help for her knees.  Per [place] CSRS database her fill dates were 01/08/2016 for percocet and 12/30/2015 for oxycontin respectively. She is receiving narcotics from no other provider at this time, but pain management will take over for her next fills. Pain contract is signed. All previous drug screens have shown only what she is taking.   She established with chronic pain in 01/13/2016. She has a follow-up scheduled on 02/10/2016. This should be her last fill from this office. Started on new injection with dermatology for her arthritis.  Having site reaction in her thigh. Has been taking tylenol and benadryl.  BP is elevated. Did not take BP medications this morning.   Past Medical History  Diagnosis Date  . Cardiomyopathy ([place])   . Hyperlipidemia   . Hypertension   . Thyroid disease   . Psoriasis     Current Outpatient Prescriptions on File Prior to Visit  Medication Sig  . Ascorbic Acid (VITAMIN C) 1000 MG tablet Take 1,000 mg by mouth daily.  . carvedilol (COREG) 25 MG tablet Take 1 tablet (25 mg total) by mouth 2 (two) times daily with a meal.  . Cholecalciferol (VITAMIN D-1000 MAX ST) 1000 UNITS tablet Take by mouth.  . clindamycin (CLEOCIN) 150 MG capsule Take 1 capsule (150 mg total) by mouth daily.  . clobetasol (TEMOVATE) 0.05 % external solution Apply topically 2 (two) times daily.  . digoxin (LANOXIN) 0.25 MG tablet Take  1 tablet (0.25 mg total) by mouth daily.  . fluticasone (FLONASE) 50 MCG/ACT nasal spray Place 2 sprays into both nostrils daily.  . furosemide (LASIX) 40 MG tablet Take 40 mg by mouth. Reported on 01/13/2016  . levothyroxine (SYNTHROID, LEVOTHROID) 137 MCG tablet Take 1 tablet (137 mcg total) by mouth daily before breakfast.  . lisinopril (PRINIVIL,ZESTRIL) 5 MG tablet Take 1 tablet (5 mg total) by mouth daily.  . meloxicam (MOBIC) 7.5 MG tablet Take by mouth daily.   . metroNIDAZOLE (FLAGYL) 500 MG tablet Take 1 tablet (500 mg total) by mouth 2 (two) times daily.  Marland Kitchen olopatadine (PATANOL) 0.1 % ophthalmic solution Place 1 drop into both eyes 2 (two) times daily.  Marland Kitchen Ustekinumab (STELARA) 45 MG/0.5ML SOLN Inject into the skin every 3 (three) months. Reported on 01/13/2016  . venlafaxine XR (EFFEXOR-XR) 75 MG 24 hr capsule Take 1 capsule (75 mg total) by mouth daily with breakfast.   No current facility-administered medications on file prior to visit.    Review of Systems  Constitutional: Negative for fever and chills.  HENT: Negative.   Respiratory: Negative for cough, chest tightness and wheezing.   Cardiovascular: Negative for chest pain and leg swelling.  Gastrointestinal: Negative for nausea, vomiting, abdominal pain, diarrhea and constipation.  Endocrine: Negative.  Negative for cold intolerance, heat intolerance, polydipsia, polyphagia and polyuria.  Genitourinary: Negative for dysuria and difficulty urinating.  Musculoskeletal: Positive for back pain and arthralgias.  Skin: Positive for rash.  Neurological: Negative for dizziness, light-headedness and numbness.  Psychiatric/Behavioral: Negative.    Per HPI unless specifically indicated above     Objective:    BP 154/78 mmHg  Pulse 79  Temp(Src) 98.5 F (36.9 C) (Oral)  Resp 16  Ht 5\' 10"  (1.778 m)  Wt 164 lb (74.39 kg)  BMI 23.53 kg/m2  LMP 10/07/2015  Wt Readings from Last 3 Encounters:  02/02/16 164 lb (74.39 kg)    01/13/16 163 lb (73.936 kg)  01/08/16 165 lb (74.844 kg)    Physical Exam  Constitutional: She is oriented to person, place, and time. She appears well-developed and well-nourished.  HENT:  Head: Normocephalic and atraumatic.  Neck: Neck supple.  Cardiovascular: Normal rate, regular rhythm and normal heart sounds.  Exam reveals no gallop and no friction rub.   No murmur heard. Pulmonary/Chest: Effort normal and breath sounds normal. She has no wheezes. She exhibits no tenderness.  Abdominal: Soft. Normal appearance and bowel sounds are normal. She exhibits no distension and no mass. There is no tenderness. There is no rebound and no guarding.  Musculoskeletal: Normal range of motion. She exhibits no edema.       Right shoulder: She exhibits tenderness. She exhibits normal range of motion, no swelling, no effusion, no crepitus, no deformity, no spasm and normal strength.       Arms: Lymphadenopathy:    She has no cervical adenopathy.  Neurological: She is alert and oriented to person, place, and time.  Skin: Skin is warm and dry. Rash noted.  Scattered psoriatic plaques along arms, hands, legs.    Results for orders placed or performed in visit on 12/07/15  PPD  Result Value Ref Range   TB Skin Test Negative    Induration 0.0 mm      Assessment & Plan:   Problem List Items Addressed This Visit      Musculoskeletal and Integument   Psoriasis with arthropathy ([place])    Started on new injectable medication by dermatology. Given recommendations for site reaction and will alert dermatology if reaction continues.  Will continue to follow for her psoriasis. Started with chronic pain for joint complications with goal of weaning off her oxycodone.       Relevant Medications   oxyCODONE 30 MG 12 hr tablet   oxyCODONE-acetaminophen (PERCOCET) 10-325 MG tablet     Other   Chronic pain    Pt will follow-up with chronic pain management. This will be her last refill from this office on  narcotic pain medication.  Percocet filled as a precaution- due for 6/5 refill per [facility]. Oxycontin filled today. 12/30/2015 last fill per [place] CSRS.       Relevant Medications   oxyCODONE 30 MG 12 hr tablet   oxyCODONE-acetaminophen (PERCOCET) 10-325 MG tablet      Meds ordered this encounter  Medications  . oxyCODONE 30 MG 12 hr tablet    Sig: Take 1 tablet by mouth every 12 (twelve) hours.    Dispense:  60 each    Refill:  0    Order Specific Question:  Supervising Provider    Answer:  Arlis Porta [phone]  . oxyCODONE-acetaminophen (PERCOCET) 10-325 MG tablet    Sig: Take 1 tablet by mouth every 4 (four) hours as needed for pain.    Dispense:  180 tablet    Refill:  0    Order Specific Question:  Supervising Provider    Answer:  Arlis Porta [phone]      Follow up plan: Return if symptoms worsen or fail to improve.

## 2016-02-02 NOTE — Assessment & Plan Note (Signed)
Pt will follow-up with chronic pain management. This will be her last refill from this office on narcotic pain medication.  Percocet filled as a precaution- due for 6/5 refill per [facility]. Oxycontin filled today. 12/30/2015 last fill per [place] CSRS.

## 2016-02-02 NOTE — Assessment & Plan Note (Signed)
Started on new injectable medication by dermatology. Given recommendations for site reaction and will alert dermatology if reaction continues.  Will continue to follow for her psoriasis. Started with chronic pain for joint complications with goal of weaning off her oxycodone.

## 2016-02-05 ENCOUNTER — Telehealth: Payer: Self-pay | Admitting: Family Medicine

## 2016-02-05 NOTE — Telephone Encounter (Signed)
Pharmacy called to report prescription error. [facility] gave her the wrong oxycodone. She received 30 of oxycodone IR instead of the prescribed oxycontin 30mg . Pharmacy has attempted to call her multiple times with no success. They would like her to bring the bottle back so they can give her the right prescription.   I called patient and LM for her to call [facility] ASAP.

## 2016-02-10 ENCOUNTER — Ambulatory Visit: Payer: Commercial Managed Care - PPO | Admitting: Anesthesiology

## 2016-03-02 ENCOUNTER — Telehealth: Payer: Self-pay | Admitting: Anesthesiology

## 2016-03-02 ENCOUNTER — Other Ambulatory Visit: Payer: Self-pay | Admitting: Pain Medicine

## 2016-03-02 ENCOUNTER — Telehealth: Payer: Self-pay | Admitting: Family Medicine

## 2016-03-02 NOTE — Telephone Encounter (Signed)
Left voicemail for patient to return call.

## 2016-03-02 NOTE — Telephone Encounter (Signed)
Pt was not able to get in an appt with the pain clinic for a few weeks.  She asked to be put on the schedule for Monday to see about getting pain medication.  Her call back number is [phone]

## 2016-03-02 NOTE — Telephone Encounter (Signed)
Patient needs to schedule med refill appt, there are no available appts and she will be out of meds on tues., should she see if her primary care will write meds ?   Please call patient and let her know solution ?

## 2016-03-02 NOTE — Telephone Encounter (Signed)
This is a patient of Dr. Andree Elk . Please let me know if I need to assist in any way  Thank you

## 2016-03-04 ENCOUNTER — Telehealth: Payer: Self-pay | Admitting: *Deleted

## 2016-03-04 NOTE — Telephone Encounter (Signed)
Patient called today Friday the 30th stating that pain clinic told to call her PCP to get refill of pain medication.  Please advise.

## 2016-03-04 NOTE — Telephone Encounter (Signed)
Left message with patient

## 2016-03-04 NOTE — Telephone Encounter (Signed)
Called patient to let her know that Dr Andree Elk schedule is limited and since her PA is the one who prescribed pain medication and does not look as if Dr Andree Elk has begun prescribing this may be a better option at this time.  Instructed via voicemail that she should call next week to see Dr Andree Elk has opened up any additional time slots so that she can be scheduled to see if he is going to begin prescribing.

## 2016-03-07 ENCOUNTER — Telehealth: Payer: Self-pay | Admitting: *Deleted

## 2016-03-07 NOTE — Telephone Encounter (Signed)
Per patient pain management is closed 7/3-7/5.  She will be out of medication that is why she was hoping to schedule appt sooner.

## 2016-03-07 NOTE — Telephone Encounter (Signed)
I would like to speak with pain management to ascertain if this is okay with them. Could someone please call [place] pain clinic to determine if this was their recommendation? I do not want to violate a pain contract for this patients. Thanks! AK

## 2016-03-07 NOTE — Telephone Encounter (Signed)
Per Pain management notes- they suggested she have PCP prescribe since Dr. Andree Elk has not prescribed yet. She can make an appt for pain management but she must have an appt scheduled with Dr. Andree Elk prior to this appt. I will only prescribe enough medication until she sees Dr. Andree Elk and no further refills. Thanks! AK

## 2016-03-07 NOTE — Telephone Encounter (Signed)
Called and LMTCB with pain management.

## 2016-03-09 ENCOUNTER — Telehealth: Payer: Self-pay | Admitting: Family Medicine

## 2016-03-09 NOTE — Telephone Encounter (Signed)
Received a call back from [facility] pain clinic staff. Dr. Andree Elk is prn physician. They do not have an August schedule for him as of today. Patient will have to call back to schedule appt.

## 2016-03-09 NOTE — Telephone Encounter (Signed)
Patient has been made aware that she must first schedule an appointment with pain management. Thereafter AK will see he and prescribe enough medication until she has her pain management appointment.[place] Patient said pain clinic closed on Monday. Patient advised to call them today.

## 2016-03-09 NOTE — Telephone Encounter (Signed)
Pt.called states that she called pain  Clinic today spoke with  [facility] was told that  Dr  Quita Skye schedule was not available and that she need to call back next week. Pt call back # is   [phone]

## 2016-03-10 ENCOUNTER — Ambulatory Visit (INDEPENDENT_AMBULATORY_CARE_PROVIDER_SITE_OTHER): Payer: Commercial Managed Care - PPO | Admitting: Family Medicine

## 2016-03-10 VITALS — BP 137/72 | HR 84 | Temp 97.9°F | Resp 16 | Ht 70.0 in | Wt 164.6 lb

## 2016-03-10 DIAGNOSIS — I5022 Chronic systolic (congestive) heart failure: Secondary | ICD-10-CM

## 2016-03-10 DIAGNOSIS — L4050 Arthropathic psoriasis, unspecified: Secondary | ICD-10-CM | POA: Diagnosis not present

## 2016-03-10 DIAGNOSIS — E038 Other specified hypothyroidism: Secondary | ICD-10-CM | POA: Diagnosis not present

## 2016-03-10 DIAGNOSIS — G8929 Other chronic pain: Secondary | ICD-10-CM

## 2016-03-10 DIAGNOSIS — I10 Essential (primary) hypertension: Secondary | ICD-10-CM

## 2016-03-10 DIAGNOSIS — E034 Atrophy of thyroid (acquired): Secondary | ICD-10-CM

## 2016-03-10 LAB — COMPLETE METABOLIC PANEL WITH GFR
ALBUMIN: 3.8 g/dL (ref 3.6–5.1)
ALK PHOS: 65 U/L (ref 33–115)
ALT: 11 U/L (ref 6–29)
AST: 17 U/L (ref 10–35)
BILIRUBIN TOTAL: 0.8 mg/dL (ref 0.2–1.2)
BUN: 10 mg/dL (ref 7–25)
CO2: 24 mmol/L (ref 20–31)
Calcium: 8.6 mg/dL (ref 8.6–10.2)
Chloride: 104 mmol/L (ref 98–110)
Creat: 0.77 mg/dL (ref 0.50–1.10)
GFR, Est African American: >89 mL/min (ref >=60)
GFR, Est Non African American: >89 mL/min (ref >=60)
GLUCOSE: 199 mg/dL — AB (ref 65–99)
Potassium: 3.5 mmol/L (ref 3.5–5.3)
SODIUM: 136 mmol/L (ref 135–146)
TOTAL PROTEIN: 6.4 g/dL (ref 6.1–8.1)

## 2016-03-10 LAB — LIPID PANEL
Cholesterol: 194 mg/dL (ref 125–200)
HDL: 46 mg/dL (ref >=46)
LDL CALC: 113 mg/dL (ref <130)
Total CHOL/HDL Ratio: 4.2 Ratio (ref <=5.0)
Triglycerides: 176 mg/dL — ABNORMAL HIGH (ref <150)
VLDL: 35 mg/dL — ABNORMAL HIGH (ref <30)

## 2016-03-10 MED ORDER — OXYCODONE-ACETAMINOPHEN 10-325 MG PO TABS
1.0000 | ORAL_TABLET | ORAL | Status: DC | PRN
Start: 2016-03-10 — End: 2016-04-07

## 2016-03-10 MED ORDER — OXYCODONE HCL ER 30 MG PO T12A: 1.0000 | EXTENDED_RELEASE_TABLET | Freq: Two times a day (BID) | ORAL | Status: DC

## 2016-03-10 NOTE — Assessment & Plan Note (Signed)
Pt currently following with derm only. Taking a biologic. Using pain medication for her joint issues-

## 2016-03-10 NOTE — Assessment & Plan Note (Signed)
Check digoxin level 

## 2016-03-10 NOTE — Assessment & Plan Note (Signed)
Check CMP.  ?

## 2016-03-10 NOTE — Patient Instructions (Signed)
Please make follow-up appt next week with Dr. Andree Elk.

## 2016-03-10 NOTE — Progress Notes (Signed)
Subjective:    Patient ID: Krista Kennedy, female    DOB: 11/20/1965, 50 y.o.   MRN: [id number]  HPI: Krista Kennedy is a 50 y.o. female presenting on 03/10/2016 for Medication Refill   HPI  Pt presents for refill of her pain medication for psoriatic arthritis. Pain is located in the L hip. Bilateral knees. Has started working and walking more. With walking the hip has felt better. Occasional constipation from opioids. She has tried to using solely NSAIDs in the past without complete resolution of pain. Currently taking meloxicam with some help for her knees.  She had a visit with chronic pain on 5/10 and cancelled her 6/7 appt with Dr. Andree Elk. She did not schedule another appt and is unable to get appt until August. Chronic pain suggested that she contact our office for refill until she can be seen. At this point our pain contract is complete and she needs to transition into chronic pain.   All previous drug screens have shown only what she is taking.  [place] CSRS fill dates are 5/30 and 6/2 respectively.  Pt also needs lab work for her chronic conditions. Orders placed today.    Past Medical History  Diagnosis Date  . Cardiomyopathy ([place])   . Hyperlipidemia   . Hypertension   . Thyroid disease   . Psoriasis     Current Outpatient Prescriptions on File Prior to Visit  Medication Sig  . Ascorbic Acid (VITAMIN C) 1000 MG tablet Take 1,000 mg by mouth daily.  . carvedilol (COREG) 25 MG tablet Take 1 tablet (25 mg total) by mouth 2 (two) times daily with a meal.  . Cholecalciferol (VITAMIN D-1000 MAX ST) 1000 UNITS tablet Take by mouth.  . clindamycin (CLEOCIN) 150 MG capsule Take 1 capsule (150 mg total) by mouth daily.  . clobetasol (TEMOVATE) 0.05 % external solution Apply topically 2 (two) times daily.  . digoxin (LANOXIN) 0.25 MG tablet Take 1 tablet (0.25 mg total) by mouth daily.  . fluticasone (FLONASE) 50 MCG/ACT nasal spray Place 2 sprays into both nostrils daily.  . furosemide  (LASIX) 40 MG tablet Take 40 mg by mouth. Reported on 01/13/2016  . levothyroxine (SYNTHROID, LEVOTHROID) 137 MCG tablet Take 1 tablet (137 mcg total) by mouth daily before breakfast.  . lisinopril (PRINIVIL,ZESTRIL) 5 MG tablet Take 1 tablet (5 mg total) by mouth daily.  . meloxicam (MOBIC) 7.5 MG tablet Take by mouth daily.   . metroNIDAZOLE (FLAGYL) 500 MG tablet Take 1 tablet (500 mg total) by mouth 2 (two) times daily.  Marland Kitchen olopatadine (PATANOL) 0.1 % ophthalmic solution Place 1 drop into both eyes 2 (two) times daily.  Marland Kitchen Ustekinumab (STELARA) 45 MG/0.5ML SOLN Inject into the skin every 3 (three) months. Reported on 01/13/2016  . venlafaxine XR (EFFEXOR-XR) 75 MG 24 hr capsule Take 1 capsule (75 mg total) by mouth daily with breakfast.   No current facility-administered medications on file prior to visit.    Review of Systems Per HPI unless specifically indicated above     Objective:    BP 137/72 mmHg  Pulse 84  Temp(Src) 97.9 F (36.6 C) (Oral)  Resp 16  Ht 5\' 10"  (1.778 m)  Wt 164 lb 9.6 oz (74.662 kg)  BMI 23.62 kg/m2  Wt Readings from Last 3 Encounters:  03/10/16 164 lb 9.6 oz (74.662 kg)  02/02/16 164 lb (74.39 kg)  01/13/16 163 lb (73.936 kg)    Physical Exam Results for orders placed or performed in visit  on 12/07/15  PPD  Result Value Ref Range   TB Skin Test Negative    Induration 0.0 mm      Assessment & Plan:   Problem List Items Addressed This Visit      Cardiovascular and Mediastinum   Chronic systolic congestive heart failure (HCC)    Check digoxin level.       Relevant Orders   Lipid Profile   Digoxin level   Essential hypertension    Check CMP.        Endocrine   Adult hypothyroidism   Relevant Orders   TSH     Musculoskeletal and Integument   Psoriasis with arthropathy ([place])    Pt currently following with derm only. Taking a biologic. Using pain medication for her joint issues-      Relevant Medications   oxyCODONE 30 MG 12 hr  tablet   oxyCODONE-acetaminophen (PERCOCET) 10-325 MG tablet   Other Relevant Orders   COMPLETE METABOLIC PANEL WITH GFR     Other   Chronic pain - Primary    Pt told it is imperative that she call and schedule an appt with Chronic pain next week as soon as possible. Pt told our pain contract is complete and I am no longer prescribing narcotics to her. 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 No further refills will be given at this time.      Relevant Medications   oxyCODONE 30 MG 12 hr tablet   oxyCODONE-acetaminophen (PERCOCET) 10-325 MG tablet      Meds ordered this encounter  Medications  . betamethasone dipropionate (DIPROLENE) 0.05 % ointment    Sig:     Refill:  0  . fluocinonide (LIDEX) 0.05 % external solution    Sig:     Refill:  0  . oxyCODONE 30 MG 12 hr tablet    Sig: Take 1 tablet by mouth every 12 (twelve) hours.    Dispense:  60 each    Refill:  0    Order Specific Question:  Supervising Provider    Answer:  Arlis Porta [phone]  . oxyCODONE-acetaminophen (PERCOCET) 10-325 MG tablet    Sig: Take 1 tablet by mouth every 4 (four) hours as needed for pain.    Dispense:  180 tablet    Refill:  0    Order Specific Question:  Supervising Provider    Answer:  Arlis Porta [id number]      Follow up plan: Return in about 3 months (around 06/10/2016) for htn follow-up.Marland Kitchen

## 2016-03-10 NOTE — Assessment & Plan Note (Signed)
Pt told it is imperative that she call and schedule an appt with Chronic pain next week as soon as possible. Pt told our pain contract is complete and I am no longer prescribing narcotics to her. 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 No further refills will be given at this time.

## 2016-03-11 ENCOUNTER — Other Ambulatory Visit: Payer: Self-pay | Admitting: Family Medicine

## 2016-03-11 LAB — DIGOXIN LEVEL: Digoxin Level: 1.7 ug/L (ref 0.8–2.0)

## 2016-03-11 LAB — TSH: TSH: 0.24 m[IU]/L — ABNORMAL LOW

## 2016-03-11 MED ORDER — LEVOTHYROXINE SODIUM 125 MCG PO TABS: 125.0000 ug | ORAL_TABLET | Freq: Every day | ORAL | Status: DC

## 2016-04-07 ENCOUNTER — Ambulatory Visit: Payer: Commercial Managed Care - PPO | Attending: Anesthesiology | Admitting: Anesthesiology

## 2016-04-07 ENCOUNTER — Encounter: Payer: Self-pay | Admitting: Anesthesiology

## 2016-04-07 VITALS — BP 162/67 | HR 94 | Temp 98.2°F | Resp 18 | Ht 70.0 in | Wt 160.0 lb

## 2016-04-07 DIAGNOSIS — I429 Cardiomyopathy, unspecified: Secondary | ICD-10-CM | POA: Insufficient documentation

## 2016-04-07 DIAGNOSIS — Z87891 Personal history of nicotine dependence: Secondary | ICD-10-CM | POA: Insufficient documentation

## 2016-04-07 DIAGNOSIS — G8929 Other chronic pain: Secondary | ICD-10-CM | POA: Insufficient documentation

## 2016-04-07 DIAGNOSIS — L409 Psoriasis, unspecified: Secondary | ICD-10-CM | POA: Diagnosis not present

## 2016-04-07 DIAGNOSIS — M79646 Pain in unspecified finger(s): Secondary | ICD-10-CM | POA: Insufficient documentation

## 2016-04-07 DIAGNOSIS — E079 Disorder of thyroid, unspecified: Secondary | ICD-10-CM | POA: Insufficient documentation

## 2016-04-07 DIAGNOSIS — I10 Essential (primary) hypertension: Secondary | ICD-10-CM | POA: Insufficient documentation

## 2016-04-07 DIAGNOSIS — M25551 Pain in right hip: Secondary | ICD-10-CM | POA: Insufficient documentation

## 2016-04-07 DIAGNOSIS — M25549 Pain in joints of unspecified hand: Secondary | ICD-10-CM

## 2016-04-07 DIAGNOSIS — L4050 Arthropathic psoriasis, unspecified: Secondary | ICD-10-CM | POA: Diagnosis not present

## 2016-04-07 DIAGNOSIS — M25552 Pain in left hip: Secondary | ICD-10-CM | POA: Insufficient documentation

## 2016-04-07 DIAGNOSIS — E785 Hyperlipidemia, unspecified: Secondary | ICD-10-CM | POA: Diagnosis not present

## 2016-04-07 DIAGNOSIS — M25559 Pain in unspecified hip: Secondary | ICD-10-CM

## 2016-04-07 DIAGNOSIS — G894 Chronic pain syndrome: Secondary | ICD-10-CM

## 2016-04-07 MED ORDER — OXYCODONE-ACETAMINOPHEN 10-325 MG PO TABS: 1.0000 | ORAL_TABLET | Freq: Four times a day (QID) | ORAL | 0 refills | Status: DC | PRN

## 2016-04-07 MED ORDER — OXYCODONE HCL ER 30 MG PO T12A: 1.0000 | EXTENDED_RELEASE_TABLET | Freq: Two times a day (BID) | ORAL | 0 refills | Status: DC

## 2016-04-07 NOTE — Progress Notes (Signed)
Subjective:  Patient ID: Krista Kennedy, female    DOB: 20-Jun-1966  Age: 50 y.o. MRN: [id number]  CC: Pain (generalized pain due to psoriatic arthritis) and Back Pain (lower)      PROCEDURE:None  HPI Takeya Foell presents for a new patient evaluation. She has a long-standing history of low back pain bilateral hip pain. She's failed conservative therapy and has been through physical therapy and multiple other modalities without significant improvement in her pain. She presents today on OxyContin 30 mg twice a day and Percocet 10 mg tablets taking these 6 times a day. She states that this regimen keeps her pain under reasonable control. She describes a gnawing aching pain in the low back that radiates into both hips. It's present throughout the mainstay of the day and even despite the medications the pain is severe and unremitting. She describes a maximum VAS score the can be between an 8 and 10 and an excruciating nature to the pain. She states that she is never pain-free. She really has any associated low back pain but does have pain that radiates from the hip on both sides into the posterior lateral legs. No associated weakness is reported today. She furthermore reports no side effects with the regimen of OxyContin and Percocet with no reported sedation or somnolence.  History Unice has a past medical history of Cardiomyopathy ([place]); Hyperlipidemia; Hypertension; Psoriasis; and Thyroid disease.   She has a past surgical history that includes Thyroidectomy; Tonsillectomy; Tubal ligation; and Endometrial ablation (2015).   Her family history includes Heart disease in her father; Hyperlipidemia in her father and mother; Hypertension in her father and mother.She reports that she has quit smoking. Her smoking use included Cigarettes. She has a 3.75 pack-year smoking history. She has never used smokeless tobacco. She reports that she does not drink alcohol or use drugs.  No results found for this  or any previous visit.  ToxAssure Select 13  Date Value Ref Range Status  11/04/2015 FINAL  Final    Comment:    ==================================================================== TOXASSURE SELECT 13 (MW) ==================================================================== Test                             Result       Flag       Units Drug Present   Oxycodone                      5369                    ng/mg creat   Oxymorphone                    4020                    ng/mg creat   Noroxycodone                   >6329                   ng/mg creat   Noroxymorphone                 1641                    ng/mg creat    Sources of oxycodone are scheduled prescription medications.    Oxymorphone, noroxycodone, and noroxymorphone are expected    metabolites of oxycodone.  Oxymorphone is also available as a    scheduled prescription medication. ==================================================================== Test                      Result    Flag   Units      Ref Range   Creatinine              158              mg/dL      >=20 ==================================================================== Declared Medications:  Medication list was not provided. ==================================================================== For clinical consultation, please call [phone]. ====================================================================     Outpatient Medications Prior to Visit  Medication Sig Dispense Refill  . Ascorbic Acid (VITAMIN C) 1000 MG tablet Take 1,000 mg by mouth daily.    . carvedilol (COREG) 25 MG tablet Take 1 tablet (25 mg total) by mouth 2 (two) times daily with a meal. 180 tablet 3  . Cholecalciferol (VITAMIN D-1000 MAX ST) 1000 UNITS tablet Take by mouth.    . clobetasol (TEMOVATE) 0.05 % external solution Apply topically 2 (two) times daily.    . digoxin (LANOXIN) 0.25 MG tablet Take 1 tablet (0.25 mg total) by mouth daily. 90 tablet 3  . lisinopril  (PRINIVIL,ZESTRIL) 5 MG tablet Take 1 tablet (5 mg total) by mouth daily. 90 tablet 3  . meloxicam (MOBIC) 7.5 MG tablet Take by mouth daily.     Marland Kitchen venlafaxine XR (EFFEXOR-XR) 75 MG 24 hr capsule Take 1 capsule (75 mg total) by mouth daily with breakfast. 90 capsule 3  . levothyroxine (SYNTHROID, LEVOTHROID) 137 MCG tablet Take 1 tablet (137 mcg total) by mouth daily before breakfast. 90 tablet 3  . oxyCODONE 30 MG 12 hr tablet Take 1 tablet by mouth every 12 (twelve) hours. 60 each 0  . oxyCODONE-acetaminophen (PERCOCET) 10-325 MG tablet Take 1 tablet by mouth every 4 (four) hours as needed for pain. 180 tablet 0  . clindamycin (CLEOCIN) 150 MG capsule Take 1 capsule (150 mg total) by mouth daily. (Patient not taking: Reported on 04/07/2016) 30 capsule 11  . fluticasone (FLONASE) 50 MCG/ACT nasal spray Place 2 sprays into both nostrils daily. (Patient not taking: Reported on 04/07/2016) 16 g 3  . furosemide (LASIX) 40 MG tablet Take 40 mg by mouth. Reported on 01/13/2016    . metroNIDAZOLE (FLAGYL) 500 MG tablet Take 1 tablet (500 mg total) by mouth 2 (two) times daily. (Patient not taking: Reported on 04/07/2016) 21 tablet 0  . olopatadine (PATANOL) 0.1 % ophthalmic solution Place 1 drop into both eyes 2 (two) times daily. 15 mL 3  . Ustekinumab (STELARA) 45 MG/0.5ML SOLN Inject into the skin every 3 (three) months. Reported on 01/13/2016     No facility-administered medications prior to visit.    Lab Results  Component Value Date   WBC 6.4 04/30/2013   HGB 12.2 04/30/2013   HCT 36.3 04/30/2013   PLT 222 09/09/2012   GLUCOSE 199 (H) 03/10/2016   CHOL 194 03/10/2016   TRIG 176 (H) 03/10/2016   HDL 46 03/10/2016   LDLCALC 113 03/10/2016   ALT 11 03/10/2016   AST 17 03/10/2016   NA 136 03/10/2016   K 3.5 03/10/2016   CL 104 03/10/2016   CREATININE 0.77 03/10/2016   BUN 10 03/10/2016   CO2 24 03/10/2016   TSH 0.24 (L) 03/10/2016     --------------------------------------------------------------------------------------------------------------------- Dg Chest 2 View  Result Date: 1/5/201 PRIOR REPORT IMPORTED FROM AN EXTERNAL SYSTEM  PRIOR REPORT IMPORTED FROM THE SYNGO WORKFLOW SYSTEM REASON FOR EXAM:    SOB COMMENTS: PROCEDURE:     DXR - DXR CHEST PA (OR AP) AND LATERAL  - Sep 09 2012  1:57PM RESULT:     The lungs are clear. The cardiac silhouette and visualized bony skeleton are unremarkable. IMPRESSION: 1. Chest radiograph without evidence of acute cardiopulmonary disease.        ---------------------------------------------------------------------------------------------------------------------- Past Medical History:  Diagnosis Date  . Cardiomyopathy ([place])   . Hyperlipidemia   . Hypertension   . Psoriasis   . Thyroid disease     Past Surgical History:  Procedure Laterality Date  . ENDOMETRIAL ABLATION  2015  . THYROIDECTOMY    . TONSILLECTOMY    . TUBAL LIGATION      Family History  Problem Relation Age of Onset  . Hypertension Mother   . Hyperlipidemia Mother   . Heart disease Father     CABG   . Hyperlipidemia Father   . Hypertension Father     Social History  Substance Use Topics  . Smoking status: Former Smoker    Packs/day: 0.25    Years: 15.00    Types: Cigarettes  . Smokeless tobacco: Never Used  . Alcohol use No    ---------------------------------------------------------------------------------------------------------------------- Social History   Social History  . Marital status: Married    Spouse name: N/A  . Number of children: N/A  . Years of education: N/A   Social History Main Topics  . Smoking status: Former Smoker    Packs/day: 0.25    Years: 15.00    Types: Cigarettes  . Smokeless tobacco: Never Used  . Alcohol use No  . Drug use: No  . Sexual activity: Yes   Other Topics Concern  . None   Social History Narrative  . None    Scheduled  Meds: Continuous Infusions: PRN Meds:.   BP (!) 167/81   Pulse 73   Temp 98.2 F (36.8 C) (Oral)   Resp 16   Ht 5\' 10"  (1.778 m)   Wt 163 lb (73.9 kg)   LMP 10/07/2015   SpO2 100%   BMI 23.39 kg/m    BP Readings from Last 3 Encounters:  04/07/16 (!) 162/67  03/10/16 137/72  02/02/16 (!) 154/78     Wt Readings from Last 3 Encounters:  04/07/16 160 lb (72.6 kg)  03/10/16 164 lb 9.6 oz (74.7 kg)  02/02/16 164 lb (74.4 kg)     ----------------------------------------------------------------------------------------------------------------------  ROS Review of Systems as per chart  Objective:  BP (!) 167/81   Pulse 73   Temp 98.2 F (36.8 C) (Oral)   Resp 16   Ht 5\' 10"  (1.778 m)   Wt 163 lb (73.9 kg)   LMP 10/07/2015   SpO2 100%   BMI 23.39 kg/m   Physical Exam patient is alert cooperative compliant Pupils are equally round reactive to light extraocular muscles intact Heart is regular rate and rhythm without murmur Lungs are clear to also dictation She has some paraspinous muscle tenderness but no overt trigger points. Her strength appears to be well maintained throughout the lower extremities. Negative straight leg raise bilaterally good muscle tone and bulk is noted.     Assessment & Plan:   Dalayah was seen today for pain and back pain.  Diagnoses and all orders for this visit:  Chronic pain syndrome  Psoriatic arthritis ([place])  Hip pain, bilateral  Pain in multiple finger joints     ----------------------------------------------------------------------------------------------------------------------  Problem List Items Addressed This Visit      Musculoskeletal and Integument   Psoriatic arthritis ([place])     Other   Chronic pain syndrome - Primary   Hip pain, bilateral   Pain in multiple finger joints    Other Visit Diagnoses   None.      ----------------------------------------------------------------------------------------------------------------------  1. Psoriatic arthritis ([place]) Long discussion with her regarding her care. I think she would be a candidate to continue on OxyContin 30 mg twice a day as this is been well tolerated. I do believe that a reduction in the Percocet 10 mg tablets would be desirable. I've had this discussion with her. We'll have return to clinic in approximately 1 month. Her urine tox screens have been reviewed and appropriate  2. Chronic pain syndrome As above as above  3. Hip pain, bilateral As above  4. Pain in multiple finger joints As above    ----------------------------------------------------------------------------------------------------------------------  I am having Ms. Laguardia maintain her furosemide, Ustekinumab, vitamin C, clobetasol, Cholecalciferol, digoxin, carvedilol, lisinopril, venlafaxine XR, clindamycin, meloxicam, fluticasone, olopatadine, and metroNIDAZOLE.   No orders of the defined types were placed in this encounter.      Follow-up: Return in about 2 weeks (around 01/27/2016) for evaluation.    Molli Barrows, MD  This dictation was performed utilizing Dragon voice recognition software.  Please excuse any unintentional or mistaken typographical errors as a result of its unedited utilization.

## 2016-04-07 NOTE — Progress Notes (Signed)
Subjective:  Patient ID: Krista Kennedy, female    DOB: Mar 10, 1966  Age: 50 y.o. MRN: [id number]  CC: Hip Pain (bilateral, left worse than right)   Service Provided on Last Visit: Med Refill  PROCEDURE:None  HPI Krista Kennedy presents for a re evaluation. She has a long-standing history of low back pain bilateral hip pain. She's failed conservative therapy and has been through physical therapy and multiple other modalities without significant improvement in her pain. She presents today on OxyContin 30 mg twice a day and Percocet 10 mg tablets taking these 6 times a day. She states that this regimen keeps her pain under reasonable control. She describes a gnawing aching pain in the low back that radiates into both hips. It's present throughout the mainstay of the day and even despite the medications the pain is severe and unremitting. She describes a maximum VAS score the can be between an 8 and 10 and an excruciating nature to the pain. She states that she is never pain-free. She really has any associated low back pain but does have pain that radiates from the hip on both sides into the posterior lateral legs. No associated weakness is reported today. She furthermore reports no side effects with the regimen of OxyContin and Percocet with no reported sedation or somnolence.  Since her last visit she reports no significant changes. The quality characteristic and distribution of pain have been otherwise stable. Her medications have been refilled by her primary care physician recently. She reports being on no medications at this time.  History Darby has a past medical history of Cardiomyopathy ([place]); Hyperlipidemia; Hypertension; Psoriasis; and Thyroid disease.   She has a past surgical history that includes Thyroidectomy; Tonsillectomy; Tubal ligation; and Endometrial ablation (2015).   Her family history includes Heart disease in her father; Hyperlipidemia in her father and mother; Hypertension  in her father and mother.She reports that she has quit smoking. Her smoking use included Cigarettes. She has a 3.75 pack-year smoking history. She has never used smokeless tobacco. She reports that she does not drink alcohol or use drugs.  No results found for this or any previous visit.  ToxAssure Select 13  Date Value Ref Range Status  11/04/2015 FINAL  Final    Comment:    ==================================================================== TOXASSURE SELECT 13 (MW) ==================================================================== Test                             Result       Flag       Units Drug Present   Oxycodone                      5369                    ng/mg creat   Oxymorphone                    4020                    ng/mg creat   Noroxycodone                   >6329                   ng/mg creat   Noroxymorphone                 1641  ng/mg creat    Sources of oxycodone are scheduled prescription medications.    Oxymorphone, noroxycodone, and noroxymorphone are expected    metabolites of oxycodone. Oxymorphone is also available as a    scheduled prescription medication. ==================================================================== Test                      Result    Flag   Units      Ref Range   Creatinine              158              mg/dL      >=20 ==================================================================== Declared Medications:  Medication list was not provided. ==================================================================== For clinical consultation, please call [phone]. ====================================================================     Outpatient Medications Prior to Visit  Medication Sig Dispense Refill  . Ascorbic Acid (VITAMIN C) 1000 MG tablet Take 1,000 mg by mouth daily.    . carvedilol (COREG) 25 MG tablet Take 1 tablet (25 mg total) by mouth 2 (two) times daily with a meal. 180 tablet 3  .  Cholecalciferol (VITAMIN D-1000 MAX ST) 1000 UNITS tablet Take by mouth.    . clobetasol (TEMOVATE) 0.05 % external solution Apply topically 2 (two) times daily.    . digoxin (LANOXIN) 0.25 MG tablet Take 1 tablet (0.25 mg total) by mouth daily. 90 tablet 3  . fluocinonide (LIDEX) 0.05 % external solution   0  . furosemide (LASIX) 40 MG tablet Take 40 mg by mouth. Reported on 01/13/2016    . levothyroxine (SYNTHROID, LEVOTHROID) 125 MCG tablet Take 1 tablet (125 mcg total) by mouth daily. 90 tablet 3  . lisinopril (PRINIVIL,ZESTRIL) 5 MG tablet Take 1 tablet (5 mg total) by mouth daily. 90 tablet 3  . meloxicam (MOBIC) 7.5 MG tablet Take by mouth daily.     Marland Kitchen olopatadine (PATANOL) 0.1 % ophthalmic solution Place 1 drop into both eyes 2 (two) times daily. 15 mL 3  . venlafaxine XR (EFFEXOR-XR) 75 MG 24 hr capsule Take 1 capsule (75 mg total) by mouth daily with breakfast. 90 capsule 3  . betamethasone dipropionate (DIPROLENE) 0.05 % ointment   0  . clindamycin (CLEOCIN) 150 MG capsule Take 1 capsule (150 mg total) by mouth daily. (Patient not taking: Reported on 04/07/2016) 30 capsule 11  . fluticasone (FLONASE) 50 MCG/ACT nasal spray Place 2 sprays into both nostrils daily. (Patient not taking: Reported on 04/07/2016) 16 g 3  . metroNIDAZOLE (FLAGYL) 500 MG tablet Take 1 tablet (500 mg total) by mouth 2 (two) times daily. (Patient not taking: Reported on 04/07/2016) 21 tablet 0  . Ustekinumab (STELARA) 45 MG/0.5ML SOLN Inject into the skin every 3 (three) months. Reported on 01/13/2016    . oxyCODONE 30 MG 12 hr tablet Take 1 tablet by mouth every 12 (twelve) hours. 60 each 0  . oxyCODONE-acetaminophen (PERCOCET) 10-325 MG tablet Take 1 tablet by mouth every 4 (four) hours as needed for pain. 180 tablet 0   No facility-administered medications prior to visit.    Lab Results  Component Value Date   WBC 6.4 04/30/2013   HGB 12.2 04/30/2013   HCT 36.3 04/30/2013   PLT 222 09/09/2012   GLUCOSE 199 (H)  03/10/2016   CHOL 194 03/10/2016   TRIG 176 (H) 03/10/2016   HDL 46 03/10/2016   LDLCALC 113 03/10/2016   ALT 11 03/10/2016   AST 17 03/10/2016   NA 136 03/10/2016   K 3.5  03/10/2016   CL 104 03/10/2016   CREATININE 0.77 03/10/2016   BUN 10 03/10/2016   CO2 24 03/10/2016   TSH 0.24 (L) 03/10/2016    --------------------------------------------------------------------------------------------------------------------- Dg Chest 2 View  Result Date: 1/5/201 PRIOR REPORT IMPORTED FROM AN EXTERNAL SYSTEM  PRIOR REPORT IMPORTED FROM THE SYNGO WORKFLOW SYSTEM REASON FOR EXAM:    SOB COMMENTS: PROCEDURE:     DXR - DXR CHEST PA (OR AP) AND LATERAL  - Sep 09 2012  1:57PM RESULT:     The lungs are clear. The cardiac silhouette and visualized bony skeleton are unremarkable. IMPRESSION: 1. Chest radiograph without evidence of acute cardiopulmonary disease.        ---------------------------------------------------------------------------------------------------------------------- Past Medical History:  Diagnosis Date  . Cardiomyopathy ([place])   . Hyperlipidemia   . Hypertension   . Psoriasis   . Thyroid disease     Past Surgical History:  Procedure Laterality Date  . ENDOMETRIAL ABLATION  2015  . THYROIDECTOMY    . TONSILLECTOMY    . TUBAL LIGATION      Family History  Problem Relation Age of Onset  . Hypertension Mother   . Hyperlipidemia Mother   . Heart disease Father     CABG   . Hyperlipidemia Father   . Hypertension Father     Social History  Substance Use Topics  . Smoking status: Former Smoker    Packs/day: 0.25    Years: 15.00    Types: Cigarettes  . Smokeless tobacco: Never Used  . Alcohol use No    ---------------------------------------------------------------------------------------------------------------------- Social History   Social History  . Marital status: Married    Spouse name: N/A  . Number of children: N/A  . Years of education: N/A    Social History Main Topics  . Smoking status: Former Smoker    Packs/day: 0.25    Years: 15.00    Types: Cigarettes  . Smokeless tobacco: Never Used  . Alcohol use No  . Drug use: No  . Sexual activity: Yes   Other Topics Concern  . None   Social History Narrative  . None    Scheduled Meds: Continuous Infusions: PRN Meds:.   BP (!) 162/67 (BP Location: Left Arm, Patient Position: Sitting)   Pulse 94   Temp 98.2 F (36.8 C) (Oral)   Resp 18   Ht 5\' 10"  (1.778 m)   Wt 160 lb (72.6 kg)   LMP  (LMP Unknown)   SpO2 96%   BMI 22.96 kg/m    BP Readings from Last 3 Encounters:  04/07/16 (!) 162/67  03/10/16 137/72  02/02/16 (!) 154/78     Wt Readings from Last 3 Encounters:  04/07/16 160 lb (72.6 kg)  03/10/16 164 lb 9.6 oz (74.7 kg)  02/02/16 164 lb (74.4 kg)     ----------------------------------------------------------------------------------------------------------------------  ROS Review of Systems as per chart  Objective:  BP (!) 162/67 (BP Location: Left Arm, Patient Position: Sitting)   Pulse 94   Temp 98.2 F (36.8 C) (Oral)   Resp 18   Ht 5\' 10"  (1.778 m)   Wt 160 lb (72.6 kg)   LMP  (LMP Unknown)   SpO2 96%   BMI 22.96 kg/m   Physical Exam patient is alert cooperative compliant Pupils are equally round reactive to light extraocular muscles intact Heart is regular rate and rhythm without murmur Lungs are clear to also dictation She has some paraspinous muscle tenderness but no overt trigger points. Her strength appears to be well maintained  throughout the lower extremities. Negative straight leg raise bilaterally good muscle tone and bulk is noted.     Assessment & Plan:   Cierria was seen today for hip pain.  Diagnoses and all orders for this visit:  Psoriatic arthritis ([place])  Psoriasis with arthropathy ([place]) -     oxyCODONE 30 MG 12 hr tablet; Take 1 tablet by mouth every 12 (twelve) hours. -     oxyCODONE-acetaminophen  (PERCOCET) 10-325 MG tablet; Take 1 tablet by mouth every 6 (six) hours as needed for pain.  Chronic pain -     oxyCODONE 30 MG 12 hr tablet; Take 1 tablet by mouth every 12 (twelve) hours.  Chronic pain syndrome  Hip pain, bilateral  Pain in multiple finger joints     ----------------------------------------------------------------------------------------------------------------------  Problem List Items Addressed This Visit      Musculoskeletal and Integument   Psoriasis with arthropathy ([facility])   Relevant Medications   oxyCODONE 30 MG 12 hr tablet   oxyCODONE-acetaminophen (PERCOCET) 10-325 MG tablet   Psoriatic arthritis ([facility]) - Primary     Other   Chronic pain   Relevant Medications   oxyCODONE 30 MG 12 hr tablet   oxyCODONE-acetaminophen (PERCOCET) 10-325 MG tablet    Other Visit Diagnoses    Chronic pain syndrome       Hip pain, bilateral       Pain in multiple finger joints          ----------------------------------------------------------------------------------------------------------------------  1. Psoriatic arthritis ([place]) Long discussion with her regarding her care. I think she would be a candidate to continue on OxyContin 30 mg twice a day as this is been well tolerated. I do believe that a reduction in the Percocet 10 mg tablets would be desirable. I've had this discussion with her. We'll have return to clinic in approximately 1 month. Her urine tox screens have been reviewed and appropriate. We will refill her prescriptions for OxyContin today at 30 mg twice a day and Percocet to be taken 1 tablet up to 4 times a day. Plan on weaning this to a lower dose ultimately as tolerated.  2. Chronic pain syndrome As above as above  3. Hip pain, bilateral As above  4. Pain in multiple finger joints As above    ----------------------------------------------------------------------------------------------------------------------  I have changed Ms.  Jeangilles's oxyCODONE-acetaminophen. I am also having her maintain her furosemide, Ustekinumab, vitamin C, clobetasol, Cholecalciferol, digoxin, carvedilol, lisinopril, venlafaxine XR, clindamycin, meloxicam, fluticasone, olopatadine, metroNIDAZOLE, betamethasone dipropionate, fluocinonide, levothyroxine, and oxyCODONE.   Meds ordered this encounter  Medications  . oxyCODONE 30 MG 12 hr tablet    Sig: Take 1 tablet by mouth every 12 (twelve) hours.    Dispense:  60 each    Refill:  0  . oxyCODONE-acetaminophen (PERCOCET) 10-325 MG tablet    Sig: Take 1 tablet by mouth every 6 (six) hours as needed for pain.    Dispense:  120 tablet    Refill:  0       Follow-up: Return in about 1 month (around 05/08/2016) for evaluation, med refill.    Molli Barrows, MD  This dictation was performed utilizing Dragon voice recognition software.  Please excuse any unintentional or mistaken typographical errors as a result of its unedited utilization.

## 2016-04-07 NOTE — Progress Notes (Signed)
Safety precautions to be maintained throughout the outpatient stay will include: orient to surroundings, keep bed in low position, maintain call bell within reach at all times, provide assistance with transfer out of bed and ambulation.  

## 2016-04-12 ENCOUNTER — Telehealth: Payer: Self-pay | Admitting: Family Medicine

## 2016-04-12 DIAGNOSIS — F32A Depression, unspecified: Secondary | ICD-10-CM

## 2016-04-12 DIAGNOSIS — F329 Major depressive disorder, single episode, unspecified: Secondary | ICD-10-CM

## 2016-04-12 MED ORDER — VENLAFAXINE HCL ER 75 MG PO CP24: 75.0000 mg | ORAL_CAPSULE | Freq: Every day | ORAL | 3 refills | Status: DC

## 2016-04-12 NOTE — Telephone Encounter (Signed)
Pt. Called requesting a  Refill on  effexor 75 mg called in to  [facility]  Pt call back # is  [phone]

## 2016-04-12 NOTE — Telephone Encounter (Signed)
Refill complete 

## 2016-04-13 ENCOUNTER — Telehealth: Payer: Self-pay | Admitting: Anesthesiology

## 2016-04-13 NOTE — Telephone Encounter (Signed)
Needs appt refill , no appts available

## 2016-04-14 NOTE — Telephone Encounter (Signed)
Called patient and left voicemail to return call to discuss medication refill.

## 2016-04-14 NOTE — Telephone Encounter (Signed)
Spoke with patient. Instructed that I will speak to Dr. Andree Elk next week re her medications. Patient to call back by Wednesday to find out what to do about refill.

## 2016-04-21 ENCOUNTER — Telehealth: Payer: Self-pay | Admitting: Anesthesiology

## 2016-04-21 NOTE — Telephone Encounter (Signed)
Patient needs refill date and will be out of meds on Sept 3, no appts available

## 2016-05-03 ENCOUNTER — Ambulatory Visit: Payer: Commercial Managed Care - PPO | Attending: Anesthesiology | Admitting: Anesthesiology

## 2016-05-03 ENCOUNTER — Encounter: Payer: Self-pay | Admitting: Anesthesiology

## 2016-05-03 VITALS — BP 176/75 | HR 87 | Temp 98.0°F | Resp 15 | Ht 70.0 in | Wt 160.0 lb

## 2016-05-03 DIAGNOSIS — E785 Hyperlipidemia, unspecified: Secondary | ICD-10-CM | POA: Insufficient documentation

## 2016-05-03 DIAGNOSIS — E890 Postprocedural hypothyroidism: Secondary | ICD-10-CM | POA: Diagnosis not present

## 2016-05-03 DIAGNOSIS — G8929 Other chronic pain: Secondary | ICD-10-CM

## 2016-05-03 DIAGNOSIS — M79646 Pain in unspecified finger(s): Secondary | ICD-10-CM

## 2016-05-03 DIAGNOSIS — G894 Chronic pain syndrome: Secondary | ICD-10-CM | POA: Diagnosis not present

## 2016-05-03 DIAGNOSIS — M25552 Pain in left hip: Secondary | ICD-10-CM

## 2016-05-03 DIAGNOSIS — I429 Cardiomyopathy, unspecified: Secondary | ICD-10-CM | POA: Diagnosis not present

## 2016-05-03 DIAGNOSIS — M25549 Pain in joints of unspecified hand: Secondary | ICD-10-CM | POA: Diagnosis not present

## 2016-05-03 DIAGNOSIS — I10 Essential (primary) hypertension: Secondary | ICD-10-CM | POA: Insufficient documentation

## 2016-05-03 DIAGNOSIS — L4050 Arthropathic psoriasis, unspecified: Secondary | ICD-10-CM

## 2016-05-03 DIAGNOSIS — Z87891 Personal history of nicotine dependence: Secondary | ICD-10-CM | POA: Diagnosis not present

## 2016-05-03 DIAGNOSIS — M25551 Pain in right hip: Secondary | ICD-10-CM | POA: Diagnosis not present

## 2016-05-03 MED ORDER — OXYCODONE-ACETAMINOPHEN 10-325 MG PO TABS: 1.0000 | ORAL_TABLET | Freq: Four times a day (QID) | ORAL | 0 refills | Status: DC | PRN

## 2016-05-03 MED ORDER — OXYCODONE HCL ER 30 MG PO T12A: 1.0000 | EXTENDED_RELEASE_TABLET | Freq: Two times a day (BID) | ORAL | 0 refills | Status: DC

## 2016-05-03 NOTE — Progress Notes (Signed)
Safety precautions to be maintained throughout the outpatient stay will include: orient to surroundings, keep bed in low position, maintain call bell within reach at all times, provide assistance with transfer out of bed and ambulation.  

## 2016-05-04 NOTE — Progress Notes (Signed)
Subjective:  Patient ID: Krista Kennedy, female    DOB: 06/26/1966  Age: 50 y.o. MRN: [id number]  CC: Hip Pain (left ) and Joint Pain (left elbow)   Service Provided on Last Visit: Evaluation, Procedure  PROCEDURE:None  HPI Krista Kennedy presents for a re evaluation. She has a long-standing history of low back pain bilateral hip pain. She's failed conservative therapy and has been through physical therapy and multiple other modalities without significant improvement in her pain. She presents today on OxyContin 30 mg twice a day and Percocet 10 mg tablets taking these 4 times a day, Down from 6 times per day. She states that this regimen keeps her pain under reasonable control and she has tolerated the reduction reasonably well.   She describes a gnawing aching pain in the low back that radiates into both hips. It's present throughout the mainstay of the day and even despite the medications the pain is severe and unremitting. She describes a maximum VAS score the can be between an 8 and 10 and an excruciating nature to the pain. She states that she is never pain-free. She really has any associated low back pain but does have pain that radiates from the hip on both sides into the posterior lateral legs. No associated weakness is reported today. She furthermore reports no side effects with the regimen of OxyContin and Percocet with no reported sedation or somnolence. This has all been stable in nature.  Since her last visit she reports no significant changes. The quality characteristic and distribution of pain have been otherwise stable. Her medications have been refilled by her primary care physician recently. She reports being on no medications at this time.  History Krista Kennedy has a past medical history of Cardiomyopathy ([place]); Hyperlipidemia; Hypertension; Psoriasis; and Thyroid disease.   She has a past surgical history that includes Thyroidectomy; Tonsillectomy; Tubal ligation; and Endometrial  ablation (2015).   Her family history includes Heart disease in her father; Hyperlipidemia in her father and mother; Hypertension in her father and mother.She reports that she has quit smoking. Her smoking use included Cigarettes. She has a 3.75 pack-year smoking history. She has never used smokeless tobacco. She reports that she does not drink alcohol or use drugs.  No results found for this or any previous visit.  [facility] Select 13  Date Value Ref Range Status  11/04/2015 FINAL  Final    Comment:    ==================================================================== TOX[facility] SELECT 13 (MW) ==================================================================== Test                             Result       Flag       Units Drug Present   Oxycodone                      5369                    ng/mg creat   Oxymorphone                    4020                    ng/mg creat   Noroxycodone                   >6329                   ng/mg creat  Noroxymorphone                 1641                    ng/mg creat    Sources of oxycodone are scheduled prescription medications.    Oxymorphone, noroxycodone, and noroxymorphone are expected    metabolites of oxycodone. Oxymorphone is also available as a    scheduled prescription medication. ==================================================================== Test                      Result    Flag   Units      Ref Range   Creatinine              158              mg/dL      >=20 ==================================================================== Declared Medications:  Medication list was not provided. ==================================================================== For clinical consultation, please call [phone]. ====================================================================     Outpatient Medications Prior to Visit  Medication Sig Dispense Refill  . Ascorbic Acid (VITAMIN C) 1000 MG tablet Take 1,000 mg by mouth daily.     . betamethasone dipropionate (DIPROLENE) 0.05 % ointment   0  . carvedilol (COREG) 25 MG tablet Take 1 tablet (25 mg total) by mouth 2 (two) times daily with a meal. 180 tablet 3  . Cholecalciferol (VITAMIN D-1000 MAX ST) 1000 UNITS tablet Take by mouth.    . clindamycin (CLEOCIN) 150 MG capsule Take 1 capsule (150 mg total) by mouth daily. 30 capsule 11  . clobetasol (TEMOVATE) 0.05 % external solution Apply topically 2 (two) times daily.    . digoxin (LANOXIN) 0.25 MG tablet Take 1 tablet (0.25 mg total) by mouth daily. 90 tablet 3  . fluocinonide (LIDEX) 0.05 % external solution   0  . levothyroxine (SYNTHROID, LEVOTHROID) 125 MCG tablet Take 1 tablet (125 mcg total) by mouth daily. 90 tablet 3  . lisinopril (PRINIVIL,ZESTRIL) 5 MG tablet Take 1 tablet (5 mg total) by mouth daily. 90 tablet 3  . meloxicam (MOBIC) 7.5 MG tablet Take by mouth daily.     Marland Kitchen venlafaxine XR (EFFEXOR-XR) 75 MG 24 hr capsule Take 1 capsule (75 mg total) by mouth daily with breakfast. 90 capsule 3  . oxyCODONE 30 MG 12 hr tablet Take 1 tablet by mouth every 12 (twelve) hours. 60 each 0  . oxyCODONE-acetaminophen (PERCOCET) 10-325 MG tablet Take 1 tablet by mouth every 6 (six) hours as needed for pain. 120 tablet 0  . fluticasone (FLONASE) 50 MCG/ACT nasal spray Place 2 sprays into both nostrils daily. (Patient not taking: Reported on 05/03/2016) 16 g 3  . furosemide (LASIX) 40 MG tablet Take 40 mg by mouth. Reported on 01/13/2016    . metroNIDAZOLE (FLAGYL) 500 MG tablet Take 1 tablet (500 mg total) by mouth 2 (two) times daily. (Patient not taking: Reported on 04/07/2016) 21 tablet 0  . olopatadine (PATANOL) 0.1 % ophthalmic solution Place 1 drop into both eyes 2 (two) times daily. 15 mL 3  . Ustekinumab (STELARA) 45 MG/0.5ML SOLN Inject into the skin every 3 (three) months. Reported on 01/13/2016     No facility-administered medications prior to visit.    Lab Results  Component Value Date   WBC 6.4 04/30/2013   HGB  12.2 04/30/2013   HCT 36.3 04/30/2013   PLT 222 09/09/2012   GLUCOSE 199 (H) 03/10/2016   CHOL 194 03/10/2016   TRIG 176 (  H) 03/10/2016   HDL 46 03/10/2016   LDLCALC 113 03/10/2016   ALT 11 03/10/2016   AST 17 03/10/2016   NA 136 03/10/2016   K 3.5 03/10/2016   CL 104 03/10/2016   CREATININE 0.77 03/10/2016   BUN 10 03/10/2016   CO2 24 03/10/2016   TSH 0.24 (L) 03/10/2016    --------------------------------------------------------------------------------------------------------------------- Dg Chest 2 View  Result Date: 1/5/201 PRIOR REPORT IMPORTED FROM AN EXTERNAL SYSTEM  PRIOR REPORT IMPORTED FROM THE [facility] WORKFLOW SYSTEM REASON FOR EXAM:    SOB COMMENTS: PROCEDURE:     DXR - DXR CHEST PA (OR AP) AND LATERAL  - Sep 09 2012  1:57PM RESULT:     The lungs are clear. The cardiac silhouette and visualized bony skeleton are unremarkable. IMPRESSION: 1. Chest radiograph without evidence of acute cardiopulmonary disease.        ---------------------------------------------------------------------------------------------------------------------- Past Medical History:  Diagnosis Date  . Cardiomyopathy ([place])   . Hyperlipidemia   . Hypertension   . Psoriasis   . Thyroid disease     Past Surgical History:  Procedure Laterality Date  . ENDOMETRIAL ABLATION  2015  . THYROIDECTOMY    . TONSILLECTOMY    . TUBAL LIGATION      Family History  Problem Relation Age of Onset  . Hypertension Mother   . Hyperlipidemia Mother   . Heart disease Father     CABG   . Hyperlipidemia Father   . Hypertension Father     Social History  Substance Use Topics  . Smoking status: Former Smoker    Packs/day: 0.25    Years: 15.00    Types: Cigarettes  . Smokeless tobacco: Never Used  . Alcohol use No    ---------------------------------------------------------------------------------------------------------------------- Social History   Social History  . Marital status: Married     Spouse name: N/A  . Number of children: N/A  . Years of education: N/A   Social History Main Topics  . Smoking status: Former Smoker    Packs/day: 0.25    Years: 15.00    Types: Cigarettes  . Smokeless tobacco: Never Used  . Alcohol use No  . Drug use: No  . Sexual activity: Yes   Other Topics Concern  . None   Social History Narrative  . None    Scheduled Meds: Continuous Infusions: PRN Meds:.   BP (!) 176/75 (BP Location: Left Arm, Patient Position: Sitting, Cuff Size: Normal)   Pulse 87   Temp 98 F (36.7 C) (Oral)   Resp 15   Ht 5\' 10"  (1.778 m)   Wt 160 lb (72.6 kg)   LMP  (LMP Unknown) Comment: no periods per pt  SpO2 100%   BMI 22.96 kg/m    BP Readings from Last 3 Encounters:  05/03/16 (!) 176/75  04/07/16 (!) 162/67  03/10/16 137/72     Wt Readings from Last 3 Encounters:  05/03/16 160 lb (72.6 kg)  04/07/16 160 lb (72.6 kg)  03/10/16 164 lb 9.6 oz (74.7 kg)     ----------------------------------------------------------------------------------------------------------------------  ROS Review of Systems as per chart  Objective:  BP (!) 176/75 (BP Location: Left Arm, Patient Position: Sitting, Cuff Size: Normal)   Pulse 87   Temp 98 F (36.7 C) (Oral)   Resp 15   Ht 5\' 10"  (1.778 m)   Wt 160 lb (72.6 kg)   LMP  (LMP Unknown) Comment: no periods per pt  SpO2 100%   BMI 22.96 kg/m   Physical Exam patient is  alert cooperative compliant Pupils are equally round reactive to light extraocular muscles intact Heart is regular rate and rhythm without murmur Lungs are clear to also dictation Otherwise no changes are noted on exam    Assessment & Plan:   Krista Kennedy was seen today for hip pain and joint pain.  Diagnoses and all orders for this visit:  Chronic pain -     oxyCODONE 30 MG 12 hr tablet; Take 1 tablet by mouth every 12 (twelve) hours.  Psoriatic arthritis (HCC)  Chronic pain syndrome  Pain in multiple finger joints  Hip  pain, bilateral  Psoriasis with arthropathy (HCC) -     oxyCODONE-acetaminophen (PERCOCET) 10-325 MG tablet; Take 1 tablet by mouth every 6 (six) hours as needed for pain. -     oxyCODONE 30 MG 12 hr tablet; Take 1 tablet by mouth every 12 (twelve) hours.     ----------------------------------------------------------------------------------------------------------------------  Problem List Items Addressed This Visit      Musculoskeletal and Integument   Psoriasis with arthropathy (HCC)   Relevant Medications   oxyCODONE-acetaminophen (PERCOCET) 10-325 MG tablet   oxyCODONE 30 MG 12 hr tablet   Psoriatic arthritis (HCC)     Other   Chronic pain - Primary   Relevant Medications   oxyCODONE-acetaminophen (PERCOCET) 10-325 MG tablet   oxyCODONE 30 MG 12 hr tablet   Chronic pain syndrome   Hip pain, bilateral   Pain in multiple finger joints    Other Visit Diagnoses   None.     ----------------------------------------------------------------------------------------------------------------------  1. Psoriatic arthritis ([place]) Long discussion with her regarding her care. I think she would be a candidate to continue on OxyContin 30 mg twice a day as this is been well tolerated. I do believe that a reduction in the Percocet 10 mg tablets would be desirable. I've had this discussion with her. We'll have return to clinic in approximately 1 month. Her urine tox screens have been reviewed and appropriate. We will refill her prescriptions for OxyContin today at 30 mg twice a day and Percocet to be taken 1 tablet up to 4 times a day. Plan on weaning this to a lower dose ultimately as tolerated.  2. Chronic pain syndrome As above as above  3. Hip pain, bilateral As above  4. Pain in multiple finger joints As above    ----------------------------------------------------------------------------------------------------------------------  I am having Krista Kennedy maintain her  furosemide, Ustekinumab, vitamin C, clobetasol, Cholecalciferol, digoxin, carvedilol, lisinopril, clindamycin, meloxicam, fluticasone, olopatadine, metroNIDAZOLE, betamethasone dipropionate, fluocinonide, levothyroxine, venlafaxine XR, Ixekizumab, oxyCODONE-acetaminophen, and oxyCODONE.   Meds ordered this encounter  Medications  . Ixekizumab (TALTZ) 80 MG/ML SOSY    Sig: Inject 80 mg into the skin as directed.  Marland Kitchen oxyCODONE-acetaminophen (PERCOCET) 10-325 MG tablet    Sig: Take 1 tablet by mouth every 6 (six) hours as needed for pain.    Dispense:  120 tablet    Refill:  0    Do not fill until [id number]  . oxyCODONE 30 MG 12 hr tablet    Sig: Take 1 tablet by mouth every 12 (twelve) hours.    Dispense:  60 each    Refill:  0    Do not fill until [id number]       Follow-up: Return in about 1 month (around 06/03/2016) for evaluation, med refill.    Molli Barrows, MD  This dictation was performed utilizing Dragon voice recognition software.  Please excuse any unintentional or mistaken typographical errors as a result of its unedited utilization.

## 2016-06-02 ENCOUNTER — Encounter: Payer: Self-pay | Admitting: Anesthesiology

## 2016-06-02 ENCOUNTER — Ambulatory Visit: Payer: Commercial Managed Care - PPO | Attending: Anesthesiology | Admitting: Anesthesiology

## 2016-06-02 VITALS — BP 159/67 | HR 64 | Temp 96.9°F | Resp 20 | Ht 70.0 in | Wt 160.0 lb

## 2016-06-02 DIAGNOSIS — I10 Essential (primary) hypertension: Secondary | ICD-10-CM | POA: Insufficient documentation

## 2016-06-02 DIAGNOSIS — M79646 Pain in unspecified finger(s): Secondary | ICD-10-CM

## 2016-06-02 DIAGNOSIS — I429 Cardiomyopathy, unspecified: Secondary | ICD-10-CM | POA: Diagnosis not present

## 2016-06-02 DIAGNOSIS — M25549 Pain in joints of unspecified hand: Secondary | ICD-10-CM

## 2016-06-02 DIAGNOSIS — L4050 Arthropathic psoriasis, unspecified: Secondary | ICD-10-CM | POA: Insufficient documentation

## 2016-06-02 DIAGNOSIS — Z8249 Family history of ischemic heart disease and other diseases of the circulatory system: Secondary | ICD-10-CM | POA: Diagnosis not present

## 2016-06-02 DIAGNOSIS — G8929 Other chronic pain: Secondary | ICD-10-CM | POA: Diagnosis not present

## 2016-06-02 DIAGNOSIS — M25552 Pain in left hip: Secondary | ICD-10-CM | POA: Insufficient documentation

## 2016-06-02 DIAGNOSIS — E079 Disorder of thyroid, unspecified: Secondary | ICD-10-CM | POA: Diagnosis not present

## 2016-06-02 DIAGNOSIS — Z87891 Personal history of nicotine dependence: Secondary | ICD-10-CM | POA: Insufficient documentation

## 2016-06-02 DIAGNOSIS — M79641 Pain in right hand: Secondary | ICD-10-CM | POA: Insufficient documentation

## 2016-06-02 DIAGNOSIS — E785 Hyperlipidemia, unspecified: Secondary | ICD-10-CM | POA: Insufficient documentation

## 2016-06-02 DIAGNOSIS — G894 Chronic pain syndrome: Secondary | ICD-10-CM | POA: Insufficient documentation

## 2016-06-02 DIAGNOSIS — Z9851 Tubal ligation status: Secondary | ICD-10-CM | POA: Diagnosis not present

## 2016-06-02 DIAGNOSIS — M25551 Pain in right hip: Secondary | ICD-10-CM

## 2016-06-02 DIAGNOSIS — Z79891 Long term (current) use of opiate analgesic: Secondary | ICD-10-CM | POA: Diagnosis not present

## 2016-06-02 DIAGNOSIS — M79676 Pain in unspecified toe(s): Secondary | ICD-10-CM | POA: Insufficient documentation

## 2016-06-02 MED ORDER — OXYCODONE HCL ER 30 MG PO T12A: 1.0000 | EXTENDED_RELEASE_TABLET | Freq: Two times a day (BID) | ORAL | 0 refills | Status: DC

## 2016-06-02 MED ORDER — OXYCODONE-ACETAMINOPHEN 10-325 MG PO TABS: 1.0000 | ORAL_TABLET | Freq: Four times a day (QID) | ORAL | 0 refills | Status: DC | PRN

## 2016-06-02 NOTE — Progress Notes (Signed)
Safety precautions to be maintained throughout the outpatient stay will include: orient to surroundings, keep bed in low position, maintain call bell within reach at all times, provide assistance with transfer out of bed and ambulation.  

## 2016-06-06 NOTE — Progress Notes (Signed)
Subjective:  Patient ID: Krista Kennedy, female    DOB: [birth year]  Age: 50 y.o. MRN: [id number]  CC: Hip Pain (left is worse); Toe Pain (bilateral, all toes); and Hand Pain (bilaterally)   Service Provided on Last Visit: Med Refill, Evaluation  PROCEDURE:None  HPI Krista Kennedy presents for a re evaluation. She has a long-standing history of low back pain bilateral hip pain. She's failed conservative therapy and has been through physical therapy and multiple other modalities without significant improvement in her pain. She presents today on OxyContin 30 mg twice a day and Percocet 10 mg tablets taking these 4 times a day, Down from 6 times per day. She states that she has tolerated the recent reduction and that her pain is been stable and generally reasonably controlled with her present regimen.   At baseline her history presents with  a gnawing aching pain in the low back that radiates into both hips. It's present throughout the mainstay of the day and even despite the medications the pain is severe and unremitting. She describes a maximum VAS score the can be between an 8 and 10 and an excruciating nature to the pain. She states that she is never pain-free. She really has no associated low back pain but does have pain that radiates from the hip on both sides into the posterior lateral legs. No associated weakness is reported today. She furthermore reports no side effects with the regimen of OxyContin and Percocet with no reported sedation or somnolence. This has all been stable in nature.  Since her last visit she reports no significant changes. The quality characteristic and distribution of pain have been otherwise stable.   History Mystery has a past medical history of Cardiomyopathy ([place]); Hyperlipidemia; Hypertension; Psoriasis; and Thyroid disease.   She has a past surgical history that includes Thyroidectomy; Tonsillectomy; Tubal ligation; and Endometrial ablation (2015).   Her family  history includes Heart disease in her father; Hyperlipidemia in her father and mother; Hypertension in her father and mother.She reports that she has quit smoking. Her smoking use included Cigarettes. She has a 3.75 pack-year smoking history. She has never used smokeless tobacco. She reports that she does not drink alcohol or use drugs.  No results found for this or any previous visit.  Tox[facility] Select 13  Date Value Ref Range Status  11/04/2015 FINAL  Final    Comment:    ==================================================================== TOXASSURE SELECT 13 (MW) ==================================================================== Test                             Result       Flag       Units Drug Present   Oxycodone                      5369                    ng/mg creat   Oxymorphone                    4020                    ng/mg creat   Noroxycodone                   >6329                   ng/mg creat   Noroxymorphone  1641                    ng/mg creat    Sources of oxycodone are scheduled prescription medications.    Oxymorphone, noroxycodone, and noroxymorphone are expected    metabolites of oxycodone. Oxymorphone is also available as a    scheduled prescription medication. ==================================================================== Test                      Result    Flag   Units      Ref Range   Creatinine              158              mg/dL      >=20 ==================================================================== Declared Medications:  Medication list was not provided. ==================================================================== For clinical consultation, please call [phone]. ====================================================================     Outpatient Medications Prior to Visit  Medication Sig Dispense Refill  . Ascorbic Acid (VITAMIN C) 1000 MG tablet Take 1,000 mg by mouth daily.    . betamethasone dipropionate  (DIPROLENE) 0.05 % ointment   0  . carvedilol (COREG) 25 MG tablet Take 1 tablet (25 mg total) by mouth 2 (two) times daily with a meal. 180 tablet 3  . Cholecalciferol (VITAMIN D-1000 MAX ST) 1000 UNITS tablet Take by mouth.    . clindamycin (CLEOCIN) 150 MG capsule Take 1 capsule (150 mg total) by mouth daily. 30 capsule 11  . clobetasol (TEMOVATE) 0.05 % external solution Apply topically 2 (two) times daily.    . digoxin (LANOXIN) 0.25 MG tablet Take 1 tablet (0.25 mg total) by mouth daily. 90 tablet 3  . fluocinonide (LIDEX) 0.05 % external solution   0  . fluticasone (FLONASE) 50 MCG/ACT nasal spray Place 2 sprays into both nostrils daily. 16 g 3  . furosemide (LASIX) 40 MG tablet Take 40 mg by mouth. Reported on 01/13/2016    . Ixekizumab (TALTZ) 80 MG/ML SOSY Inject 80 mg into the skin as directed.    Marland Kitchen levothyroxine (SYNTHROID, LEVOTHROID) 125 MCG tablet Take 1 tablet (125 mcg total) by mouth daily. 90 tablet 3  . lisinopril (PRINIVIL,ZESTRIL) 5 MG tablet Take 1 tablet (5 mg total) by mouth daily. 90 tablet 3  . meloxicam (MOBIC) 7.5 MG tablet Take by mouth daily.     . metroNIDAZOLE (FLAGYL) 500 MG tablet Take 1 tablet (500 mg total) by mouth 2 (two) times daily. 21 tablet 0  . olopatadine (PATANOL) 0.1 % ophthalmic solution Place 1 drop into both eyes 2 (two) times daily. 15 mL 3  . Ustekinumab (STELARA) 45 MG/0.5ML SOLN Inject into the skin every 3 (three) months. Reported on 01/13/2016    . venlafaxine XR (EFFEXOR-XR) 75 MG 24 hr capsule Take 1 capsule (75 mg total) by mouth daily with breakfast. 90 capsule 3  . oxyCODONE 30 MG 12 hr tablet Take 1 tablet by mouth every 12 (twelve) hours. 60 each 0  . oxyCODONE-acetaminophen (PERCOCET) 10-325 MG tablet Take 1 tablet by mouth every 6 (six) hours as needed for pain. 120 tablet 0   No facility-administered medications prior to visit.    Lab Results  Component Value Date   WBC 6.4 04/30/2013   HGB 12.2 04/30/2013   HCT 36.3 04/30/2013    PLT 222 09/09/2012   GLUCOSE 199 (H) 03/10/2016   CHOL 194 03/10/2016   TRIG 176 (H) 03/10/2016   HDL 46 03/10/2016   LDLCALC 113 03/10/2016  ALT 11 03/10/2016   AST 17 03/10/2016   NA 136 03/10/2016   K 3.5 03/10/2016   CL 104 03/10/2016   CREATININE 0.77 03/10/2016   BUN 10 03/10/2016   CO2 24 03/10/2016   TSH 0.24 (L) 03/10/2016    --------------------------------------------------------------------------------------------------------------------- Dg Chest 2 View  Result Date: 1/5/201 PRIOR REPORT IMPORTED FROM AN EXTERNAL SYSTEM  PRIOR REPORT IMPORTED FROM THE SYNGO WORKFLOW SYSTEM REASON FOR EXAM:    SOB COMMENTS: PROCEDURE:     DXR - DXR CHEST PA (OR AP) AND LATERAL  - Sep 09 2012  1:57PM RESULT:     The lungs are clear. The cardiac silhouette and visualized bony skeleton are unremarkable. IMPRESSION: 1. Chest radiograph without evidence of acute cardiopulmonary disease.        ---------------------------------------------------------------------------------------------------------------------- Past Medical History:  Diagnosis Date  . Cardiomyopathy ([place])   . Hyperlipidemia   . Hypertension   . Psoriasis   . Thyroid disease     Past Surgical History:  Procedure Laterality Date  . ENDOMETRIAL ABLATION  2015  . THYROIDECTOMY    . TONSILLECTOMY    . TUBAL LIGATION      Family History  Problem Relation Age of Onset  . Hypertension Mother   . Hyperlipidemia Mother   . Heart disease Father     CABG   . Hyperlipidemia Father   . Hypertension Father     Social History  Substance Use Topics  . Smoking status: Former Smoker    Packs/day: 0.25    Years: 15.00    Types: Cigarettes  . Smokeless tobacco: Never Used  . Alcohol use No    ---------------------------------------------------------------------------------------------------------------------- Social History   Social History  . Marital status: Married    Spouse name: N/A  . Number of  children: N/A  . Years of education: N/A   Social History Main Topics  . Smoking status: Former Smoker    Packs/day: 0.25    Years: 15.00    Types: Cigarettes  . Smokeless tobacco: Never Used  . Alcohol use No  . Drug use: No  . Sexual activity: Yes   Other Topics Concern  . None   Social History Narrative  . None    Scheduled Meds: Continuous Infusions: PRN Meds:.   BP (!) 159/67 (BP Location: Left Arm, Patient Position: Sitting, Cuff Size: Normal)   Pulse 64   Temp (!) 96.9 F (36.1 C) (Oral)   Resp 20   Ht 5\' 10"  (1.778 m)   Wt 160 lb (72.6 kg)   LMP  (LMP Unknown) Comment: no periods per pt  SpO2 100%   BMI 22.96 kg/m    BP Readings from Last 3 Encounters:  06/02/16 (!) 159/67  05/03/16 (!) 176/75  04/07/16 (!) 162/67     Wt Readings from Last 3 Encounters:  06/02/16 160 lb (72.6 kg)  05/03/16 160 lb (72.6 kg)  04/07/16 160 lb (72.6 kg)     ----------------------------------------------------------------------------------------------------------------------  ROS Review of Systems as per chart  Objective:  BP (!) 159/67 (BP Location: Left Arm, Patient Position: Sitting, Cuff Size: Normal)   Pulse 64   Temp (!) 96.9 F (36.1 C) (Oral)   Resp 20   Ht 5\' 10"  (1.778 m)   Wt 160 lb (72.6 kg)   LMP  (LMP Unknown) Comment: no periods per pt  SpO2 100%   BMI 22.96 kg/m   Physical Exam patient is alert cooperative compliant Pupils are equally round reactive to light extraocular muscles intact  Heart is regular rate and rhythm without murmur Lungs are clear to also dictation Otherwise no changes are noted on exam    Assessment & Plan:   Krista Kennedy was seen today for hip pain, toe pain and hand pain.  Diagnoses and all orders for this visit:  Chronic pain -     oxyCODONE 30 MG 12 hr tablet; Take 1 tablet by mouth every 12 (twelve) hours.  Psoriatic arthritis (HCC)  Chronic pain syndrome  Pain in multiple finger joints  Hip pain,  bilateral  Psoriasis with arthropathy (HCC) -     oxyCODONE-acetaminophen (PERCOCET) 10-325 MG tablet; Take 1 tablet by mouth every 6 (six) hours as needed for pain. -     oxyCODONE 30 MG 12 hr tablet; Take 1 tablet by mouth every 12 (twelve) hours.     ----------------------------------------------------------------------------------------------------------------------  Problem List Items Addressed This Visit      Musculoskeletal and Integument   Psoriasis with arthropathy (HCC)   Relevant Medications   oxyCODONE-acetaminophen (PERCOCET) 10-325 MG tablet   oxyCODONE 30 MG 12 hr tablet   Psoriatic arthritis (HCC)     Other   Chronic pain - Primary   Relevant Medications   oxyCODONE-acetaminophen (PERCOCET) 10-325 MG tablet   oxyCODONE 30 MG 12 hr tablet   Chronic pain syndrome   Hip pain, bilateral   Pain in multiple finger joints    Other Visit Diagnoses   None.     ----------------------------------------------------------------------------------------------------------------------  1. Psoriatic arthritis ([place]) We have reviewed her current medication management profile. She presented on a considerable dose of OxyContin and oxycodone. So far we have been able to wean her down from 6 Percocet a day to 4 per day. This in combination with her OxyContin has allowed her good pain control on a more modest dose. She has been compliant and based on her narcotic assessment sheet she denies any significant problems and describes an improvement in her overall functioning. She is to return to clinic in 1 month for reevaluation with prescriptions given today. We will plan on a repeat urine tox screen in November 2. Chronic pain syndrome As above as above. She has not had a previous lumbar MRI however her low back pain has been stable in nature with no reports of lower extremity weakness or bowel bladder dysfunction and think that it is reasonable to defer on this examination at this  time.  3. Hip pain, bilateral As above  4. Pain in multiple finger joints As above    ----------------------------------------------------------------------------------------------------------------------  I am having Krista Kennedy maintain her furosemide, Ustekinumab, vitamin C, clobetasol, Cholecalciferol, digoxin, carvedilol, lisinopril, clindamycin, meloxicam, fluticasone, olopatadine, metroNIDAZOLE, betamethasone dipropionate, fluocinonide, levothyroxine, venlafaxine XR, Ixekizumab, oxyCODONE-acetaminophen, and oxyCODONE.   Meds ordered this encounter  Medications  . oxyCODONE-acetaminophen (PERCOCET) 10-325 MG tablet    Sig: Take 1 tablet by mouth every 6 (six) hours as needed for pain.    Dispense:  120 tablet    Refill:  0    Do not fill until [id number]  . oxyCODONE 30 MG 12 hr tablet    Sig: Take 1 tablet by mouth every 12 (twelve) hours.    Dispense:  60 each    Refill:  0    Do not fill until [id number]       Follow-up: Return in about 1 month (around 07/02/2016).    Molli Barrows, MD  This dictation was performed utilizing Dragon voice recognition software.  Please excuse any unintentional or mistaken typographical errors as a  result of its unedited utilization.

## 2016-06-27 ENCOUNTER — Encounter: Payer: Self-pay | Admitting: Anesthesiology

## 2016-06-27 ENCOUNTER — Ambulatory Visit: Payer: Commercial Managed Care - PPO | Attending: Anesthesiology | Admitting: Anesthesiology

## 2016-06-27 VITALS — BP 141/74 | HR 81 | Resp 16 | Ht 70.0 in | Wt 160.0 lb

## 2016-06-27 DIAGNOSIS — E785 Hyperlipidemia, unspecified: Secondary | ICD-10-CM | POA: Insufficient documentation

## 2016-06-27 DIAGNOSIS — M25541 Pain in joints of right hand: Secondary | ICD-10-CM | POA: Diagnosis not present

## 2016-06-27 DIAGNOSIS — M25552 Pain in left hip: Secondary | ICD-10-CM

## 2016-06-27 DIAGNOSIS — I429 Cardiomyopathy, unspecified: Secondary | ICD-10-CM | POA: Insufficient documentation

## 2016-06-27 DIAGNOSIS — M25549 Pain in joints of unspecified hand: Secondary | ICD-10-CM | POA: Diagnosis not present

## 2016-06-27 DIAGNOSIS — M25551 Pain in right hip: Secondary | ICD-10-CM | POA: Insufficient documentation

## 2016-06-27 DIAGNOSIS — M1990 Unspecified osteoarthritis, unspecified site: Secondary | ICD-10-CM | POA: Insufficient documentation

## 2016-06-27 DIAGNOSIS — F17210 Nicotine dependence, cigarettes, uncomplicated: Secondary | ICD-10-CM | POA: Insufficient documentation

## 2016-06-27 DIAGNOSIS — L4050 Arthropathic psoriasis, unspecified: Secondary | ICD-10-CM | POA: Diagnosis not present

## 2016-06-27 DIAGNOSIS — I10 Essential (primary) hypertension: Secondary | ICD-10-CM | POA: Diagnosis not present

## 2016-06-27 DIAGNOSIS — M2550 Pain in unspecified joint: Secondary | ICD-10-CM | POA: Diagnosis present

## 2016-06-27 DIAGNOSIS — M25542 Pain in joints of left hand: Secondary | ICD-10-CM | POA: Diagnosis not present

## 2016-06-27 DIAGNOSIS — E079 Disorder of thyroid, unspecified: Secondary | ICD-10-CM | POA: Diagnosis not present

## 2016-06-27 DIAGNOSIS — G894 Chronic pain syndrome: Secondary | ICD-10-CM

## 2016-06-27 MED ORDER — OXYCODONE HCL ER 30 MG PO T12A: 1.0000 | EXTENDED_RELEASE_TABLET | Freq: Two times a day (BID) | ORAL | 0 refills | Status: DC

## 2016-06-27 MED ORDER — OXYCODONE-ACETAMINOPHEN 10-325 MG PO TABS: 1.0000 | ORAL_TABLET | Freq: Four times a day (QID) | ORAL | 0 refills | Status: DC | PRN

## 2016-06-27 NOTE — Progress Notes (Signed)
Safety precautions to be maintained throughout the outpatient stay will include: orient to surroundings, keep bed in low position, maintain call bell within reach at all times, provide assistance with transfer out of bed and ambulation.  

## 2016-06-27 NOTE — Progress Notes (Signed)
Subjective:  Patient ID: Krista Kennedy, female    DOB: September 26, 1965  Age: 50 y.o. MRN: [id number]  CC: Joint Pain (arthritis)   Service Provided on Last Visit: Med Refill  PROCEDURE:None  HPI Shauniece Affinito presents for a re evaluation. She was last seen 1 month ago. She states that the quality characteristic of her pain a been stable in nature however she is experiencing some increasing left hip pain. This is beyond her baseline pain and she is going to follow-up with her rheumatologist regarding this. In reference to her medications, she is doing well with no new problems based on her narcotic assessment sheet. No problems with sedation constipation or urinary hesitancy. Medications seem to keep her pain in check. She still taking OxyContin 30 mg tablets twice a day and Percocet 10/325 4 times a day. This is down from her 6 tablets per day.    At baseline her history describes a gnawing aching pain in the low back that radiates into both hips. It's present throughout the mainstay of the day and even despite the medications the pain is severe and unremitting. She describes a maximum VAS score that can be between an 8 and 10 and an excruciating nature to the pain. She states that she is never pain-free. She really has no associated low back pain but does have pain that radiates from the hip on both sides into the posterior lateral legs. No associated weakness is reported today. She furthermore reports no side effects with the regimen of OxyContin and Percocet with no reported sedation or somnolence. This has all been stable in nature. This was initially managed by her primary care physician. She was later evaluated by a rheumatologist and is due for follow-up.  Since her last visit she reports no significant changes. The quality characteristic and distribution of pain have been otherwise stable.   History Ayshia has a past medical history of Cardiomyopathy ([place]); Hyperlipidemia; Hypertension;  Psoriasis; and Thyroid disease.   She has a past surgical history that includes Thyroidectomy; Tonsillectomy; Tubal ligation; and Endometrial ablation (2015).   Her family history includes Heart disease in her father; Hyperlipidemia in her father and mother; Hypertension in her father and mother.She reports that she has quit smoking. Her smoking use included Cigarettes. She has a 3.75 pack-year smoking history. She has never used smokeless tobacco. She reports that she does not drink alcohol or use drugs.  No results found for this or any previous visit.  ToxAssure Select 13  Date Value Ref Range Status  11/04/2015 FINAL  Final    Comment:    ==================================================================== TOXASSURE SELECT 13 (MW) ==================================================================== Test                             Result       Flag       Units Drug Present   Oxycodone                      5369                    ng/mg creat   Oxymorphone                    4020                    ng/mg creat   Noroxycodone                   >  6329                   ng/mg creat   Noroxymorphone                 1641                    ng/mg creat    Sources of oxycodone are scheduled prescription medications.    Oxymorphone, noroxycodone, and noroxymorphone are expected    metabolites of oxycodone. Oxymorphone is also available as a    scheduled prescription medication. ==================================================================== Test                      Result    Flag   Units      Ref Range   Creatinine              158              mg/dL      >=20 ==================================================================== Declared Medications:  Medication list was not provided. ==================================================================== For clinical consultation, please call [phone]. ====================================================================      Outpatient Medications Prior to Visit  Medication Sig Dispense Refill  . Ascorbic Acid (VITAMIN C) 1000 MG tablet Take 1,000 mg by mouth daily.    . carvedilol (COREG) 25 MG tablet Take 1 tablet (25 mg total) by mouth 2 (two) times daily with a meal. 180 tablet 3  . Cholecalciferol (VITAMIN D-1000 MAX ST) 1000 UNITS tablet Take by mouth.    . digoxin (LANOXIN) 0.25 MG tablet Take 1 tablet (0.25 mg total) by mouth daily. 90 tablet 3  . Ixekizumab (TALTZ) 80 MG/ML SOSY Inject 80 mg into the skin as directed.    Marland Kitchen levothyroxine (SYNTHROID, LEVOTHROID) 125 MCG tablet Take 1 tablet (125 mcg total) by mouth daily. 90 tablet 3  . lisinopril (PRINIVIL,ZESTRIL) 5 MG tablet Take 1 tablet (5 mg total) by mouth daily. 90 tablet 3  . meloxicam (MOBIC) 7.5 MG tablet Take by mouth daily.     Marland Kitchen olopatadine (PATANOL) 0.1 % ophthalmic solution Place 1 drop into both eyes 2 (two) times daily. 15 mL 3  . venlafaxine XR (EFFEXOR-XR) 75 MG 24 hr capsule Take 1 capsule (75 mg total) by mouth daily with breakfast. 90 capsule 3  . oxyCODONE 30 MG 12 hr tablet Take 1 tablet by mouth every 12 (twelve) hours. 60 each 0  . oxyCODONE-acetaminophen (PERCOCET) 10-325 MG tablet Take 1 tablet by mouth every 6 (six) hours as needed for pain. 120 tablet 0  . betamethasone dipropionate (DIPROLENE) 0.05 % ointment   0  . clindamycin (CLEOCIN) 150 MG capsule Take 1 capsule (150 mg total) by mouth daily. (Patient not taking: Reported on 06/27/2016) 30 capsule 11  . fluocinonide (LIDEX) 0.05 % external solution   0  . fluticasone (FLONASE) 50 MCG/ACT nasal spray Place 2 sprays into both nostrils daily. (Patient not taking: Reported on 06/27/2016) 16 g 3  . furosemide (LASIX) 40 MG tablet Take 40 mg by mouth. Reported on 01/13/2016    . metroNIDAZOLE (FLAGYL) 500 MG tablet Take 1 tablet (500 mg total) by mouth 2 (two) times daily. (Patient not taking: Reported on 06/27/2016) 21 tablet 0  . Ustekinumab (STELARA) 45 MG/0.5ML SOLN  Inject into the skin every 3 (three) months. Reported on 01/13/2016     No facility-administered medications prior to visit.    Lab Results  Component Value Date   WBC 6.4 04/30/2013  HGB 12.2 04/30/2013   HCT 36.3 04/30/2013   PLT 222 09/09/2012   GLUCOSE 199 (H) 03/10/2016   CHOL 194 03/10/2016   TRIG 176 (H) 03/10/2016   HDL 46 03/10/2016   LDLCALC 113 03/10/2016   ALT 11 03/10/2016   AST 17 03/10/2016   NA 136 03/10/2016   K 3.5 03/10/2016   CL 104 03/10/2016   CREATININE 0.77 03/10/2016   BUN 10 03/10/2016   CO2 24 03/10/2016   TSH 0.24 (L) 03/10/2016    --------------------------------------------------------------------------------------------------------------------- Dg Chest 2 View  Result Date: 1/5/201 PRIOR REPORT IMPORTED FROM AN EXTERNAL SYSTEM  PRIOR REPORT IMPORTED FROM THE SYNGO WORKFLOW SYSTEM REASON FOR EXAM:    SOB COMMENTS: PROCEDURE:     DXR - DXR CHEST PA (OR AP) AND LATERAL  - Sep 09 2012  1:57PM RESULT:     The lungs are clear. The cardiac silhouette and visualized bony skeleton are unremarkable. IMPRESSION: 1. Chest radiograph without evidence of acute cardiopulmonary disease.        ---------------------------------------------------------------------------------------------------------------------- Past Medical History:  Diagnosis Date  . Cardiomyopathy ([place])   . Hyperlipidemia   . Hypertension   . Psoriasis   . Thyroid disease     Past Surgical History:  Procedure Laterality Date  . ENDOMETRIAL ABLATION  2015  . THYROIDECTOMY    . TONSILLECTOMY    . TUBAL LIGATION      Family History  Problem Relation Age of Onset  . Hypertension Mother   . Hyperlipidemia Mother   . Heart disease Father     CABG   . Hyperlipidemia Father   . Hypertension Father     Social History  Substance Use Topics  . Smoking status: Former Smoker    Packs/day: 0.25    Years: 15.00    Types: Cigarettes  . Smokeless tobacco: Never Used  . Alcohol use  No    ---------------------------------------------------------------------------------------------------------------------- Social History   Social History  . Marital status: Married    Spouse name: N/A  . Number of children: N/A  . Years of education: N/A   Social History Main Topics  . Smoking status: Former Smoker    Packs/day: 0.25    Years: 15.00    Types: Cigarettes  . Smokeless tobacco: Never Used  . Alcohol use No  . Drug use: No  . Sexual activity: Yes   Other Topics Concern  . None   Social History Narrative  . None    Scheduled Meds: Continuous Infusions: PRN Meds:.   BP (!) 141/74 (BP Location: Left Arm, Patient Position: Sitting, Cuff Size: Large)   Pulse 81   Resp 16   Ht 5\' 10"  (1.778 m)   Wt 160 lb (72.6 kg)   SpO2 95%   BMI 22.96 kg/m    BP Readings from Last 3 Encounters:  06/27/16 (!) 141/74  06/02/16 (!) 159/67  05/03/16 (!) 176/75     Wt Readings from Last 3 Encounters:  06/27/16 160 lb (72.6 kg)  06/02/16 160 lb (72.6 kg)  05/03/16 160 lb (72.6 kg)     ----------------------------------------------------------------------------------------------------------------------  ROS Review of Systems as per chart  Objective:  BP (!) 141/74 (BP Location: Left Arm, Patient Position: Sitting, Cuff Size: Large)   Pulse 81   Resp 16   Ht 5\' 10"  (1.778 m)   Wt 160 lb (72.6 kg)   SpO2 95%   BMI 22.96 kg/m   Physical Exam patient is alert cooperative compliant Pupils are equally round reactive to  light extraocular muscles intact Heart is regular rate and rhythm without murmur Lungs are clear to also dictation Otherwise no changes are noted on exam She is ambulating well    Assessment & Plan:   Cheyane was seen today for joint pain.  Diagnoses and all orders for this visit:  Pain in multiple finger joints -     ToxASSURE Select 13 (MW), Urine  Chronic pain syndrome -     oxyCODONE 30 MG 12 hr tablet; Take 1 tablet by mouth  every 12 (twelve) hours. -     ToxASSURE Select 13 (MW), Urine  Psoriatic arthritis ([place]) -     ToxASSURE Select 13 (MW), Urine  Hip pain, bilateral -     ToxASSURE Select 13 (MW), Urine  Psoriasis with arthropathy (HCC) -     oxyCODONE-acetaminophen (PERCOCET) 10-325 MG tablet; Take 1 tablet by mouth every 6 (six) hours as needed for pain. -     oxyCODONE 30 MG 12 hr tablet; Take 1 tablet by mouth every 12 (twelve) hours. -     ToxASSURE Select 13 (MW), Urine     ----------------------------------------------------------------------------------------------------------------------  Problem List Items Addressed This Visit      Musculoskeletal and Integument   Psoriasis with arthropathy (HCC)   Relevant Medications   oxyCODONE-acetaminophen (PERCOCET) 10-325 MG tablet   oxyCODONE 30 MG 12 hr tablet   Other Relevant Orders   ToxASSURE Select 13 (MW), Urine   Psoriatic arthritis ([place])   Relevant Orders   ToxASSURE Select 13 (MW), Urine     Other   Chronic pain syndrome   Relevant Medications   oxyCODONE 30 MG 12 hr tablet   Other Relevant Orders   ToxASSURE Select 13 (MW), Urine   Hip pain, bilateral   Relevant Orders   ToxASSURE Select 13 (MW), Urine   Pain in multiple finger joints - Primary   Relevant Orders   ToxASSURE Select 13 (MW), Urine    Other Visit Diagnoses   None.     ----------------------------------------------------------------------------------------------------------------------  1. Psoriatic arthritis ([place]) We have reviewed her current medication management profile. She presented on a considerable dose of OxyContin and oxycodone.No significant changes are noted in reference to her pain management. She is having more left hip pain recently and I have requested that she follow up with her rheumatologist for evaluation. Furthermore, I have educated her in reference to the role of the pain management clinic and that we are here to assist with her  medication management but should she have any changes in the quality or characteristic of her diffuse rheumatologic symptoms that these need to be managed by her rheumatologist and that she needs to follow-up with them regularly. Furthermore she needs to continue management with her primary care physician for her baseline medical issues as well. She understands and voices compliance with this 2. Chronic pain syndrome As above as above. She has not had a previous lumbar MRI however her low back pain has been stable in nature with no reports of lower extremity weakness or bowel bladder dysfunction and think that it is reasonable to defer on this examination at this time.  3. Hip pain, bilateral As above  4. Pain in multiple finger joints As above.....Marland Kitchen overall we will refill her medications for one month with return to clinic in 1 month. If she should have any problems with her pain medication management she is instructed to contact us for assistance.    ----------------------------------------------------------------------------------------------------------------------  I am having Ms. Rounsaville maintain her  furosemide, Ustekinumab, vitamin C, Cholecalciferol, digoxin, carvedilol, lisinopril, clindamycin, meloxicam, fluticasone, olopatadine, metroNIDAZOLE, betamethasone dipropionate, fluocinonide, levothyroxine, venlafaxine XR, Ixekizumab, oxyCODONE-acetaminophen, and oxyCODONE.   Meds ordered this encounter  Medications  . oxyCODONE-acetaminophen (PERCOCET) 10-325 MG tablet    Sig: Take 1 tablet by mouth every 6 (six) hours as needed for pain.    Dispense:  120 tablet    Refill:  0    Do not fill until [id number]  . oxyCODONE 30 MG 12 hr tablet    Sig: Take 1 tablet by mouth every 12 (twelve) hours.    Dispense:  60 each    Refill:  0    Do not fill until [id number]       Follow-up: Return in about 1 month (around 07/28/2016) for evaluation, med refill.    Molli Barrows, MD  This  dictation was performed utilizing Dragon voice recognition software.  Please excuse any unintentional or mistaken typographical errors as a result of its unedited utilization.

## 2016-07-04 LAB — TOXASSURE SELECT 13 (MW), URINE

## 2016-07-08 ENCOUNTER — Encounter: Payer: Self-pay | Admitting: Anesthesiology

## 2016-07-13 ENCOUNTER — Telehealth: Payer: Self-pay | Admitting: Family Medicine

## 2016-07-13 DIAGNOSIS — I5022 Chronic systolic (congestive) heart failure: Secondary | ICD-10-CM

## 2016-07-13 MED ORDER — CARVEDILOL 25 MG PO TABS: 25.0000 mg | ORAL_TABLET | Freq: Two times a day (BID) | ORAL | 3 refills | Status: DC

## 2016-07-13 NOTE — Telephone Encounter (Signed)
Pt needs a refill of coreg 25 mg sent to [facility] in [place].  Her call back number is [phone]

## 2016-07-13 NOTE — Telephone Encounter (Signed)
Refilled Coreg 25mg  BID, initially sent to [facility], however corrected pharmacy and sent to [facility].

## 2016-07-19 ENCOUNTER — Ambulatory Visit: Payer: Commercial Managed Care - PPO | Attending: Anesthesiology | Admitting: Anesthesiology

## 2016-07-19 ENCOUNTER — Encounter: Payer: Self-pay | Admitting: Anesthesiology

## 2016-07-19 VITALS — BP 120/74 | HR 72 | Temp 97.9°F | Resp 16 | Ht 70.0 in | Wt 160.0 lb

## 2016-07-19 DIAGNOSIS — Z79899 Other long term (current) drug therapy: Secondary | ICD-10-CM | POA: Insufficient documentation

## 2016-07-19 DIAGNOSIS — I429 Cardiomyopathy, unspecified: Secondary | ICD-10-CM | POA: Insufficient documentation

## 2016-07-19 DIAGNOSIS — M25551 Pain in right hip: Secondary | ICD-10-CM

## 2016-07-19 DIAGNOSIS — E785 Hyperlipidemia, unspecified: Secondary | ICD-10-CM | POA: Insufficient documentation

## 2016-07-19 DIAGNOSIS — Z87891 Personal history of nicotine dependence: Secondary | ICD-10-CM | POA: Diagnosis not present

## 2016-07-19 DIAGNOSIS — G894 Chronic pain syndrome: Secondary | ICD-10-CM | POA: Diagnosis not present

## 2016-07-19 DIAGNOSIS — Z8249 Family history of ischemic heart disease and other diseases of the circulatory system: Secondary | ICD-10-CM | POA: Insufficient documentation

## 2016-07-19 DIAGNOSIS — L4050 Arthropathic psoriasis, unspecified: Secondary | ICD-10-CM | POA: Diagnosis not present

## 2016-07-19 DIAGNOSIS — M25552 Pain in left hip: Secondary | ICD-10-CM | POA: Diagnosis not present

## 2016-07-19 DIAGNOSIS — E079 Disorder of thyroid, unspecified: Secondary | ICD-10-CM | POA: Insufficient documentation

## 2016-07-19 DIAGNOSIS — I10 Essential (primary) hypertension: Secondary | ICD-10-CM | POA: Insufficient documentation

## 2016-07-19 DIAGNOSIS — M25549 Pain in joints of unspecified hand: Secondary | ICD-10-CM

## 2016-07-19 MED ORDER — OXYCODONE-ACETAMINOPHEN 10-325 MG PO TABS: 1.0000 | ORAL_TABLET | Freq: Four times a day (QID) | ORAL | 0 refills | Status: DC | PRN

## 2016-07-19 MED ORDER — OXYCODONE HCL ER 30 MG PO T12A: 1.0000 | EXTENDED_RELEASE_TABLET | Freq: Two times a day (BID) | ORAL | 0 refills | Status: DC

## 2016-07-19 NOTE — Progress Notes (Signed)
Subjective:  Patient ID: Krista Kennedy, female    DOB: Nov 09, 1965  Age: 50 y.o. MRN: [id number]  CC: No chief complaint on file. Diffuse body pain affecting the hips low back with feet  Service Provided on Last Visit: Med Refill, Evaluation  PROCEDURE:None  HPI Krista Kennedy presents for a re evaluation. She was last seen 1 month ago. She continues to do well with her current regimen. Quality characteristic distribution of her pain have remained stable. Otherwise based on her narcotic assessment sheet she is doing well with this regimen at the OxyContin 30 mg twice a day and Percocet for breakthrough pain. She is down to a lower dose than on initial presentation.   At baseline her history describes a gnawing aching pain in the low back that radiates into both hips. It's present throughout the mainstay of the day and even despite the medications the pain is severe and unremitting. She describes a maximum VAS score that can be between an 8 and 10 and an excruciating nature to the pain. She states that she is never pain-free. She really has no associated low back pain but does have pain that radiates from the hip on both sides into the posterior lateral legs. No associated weakness is reported today. She furthermore reports no side effects with the regimen of OxyContin and Percocet with no reported sedation or somnolence. This has all been stable in nature. This was initially managed by her primary care physician. She was later evaluated by a rheumatologist and is due for follow-up.  Since her last visit she reports no significant changes. The quality characteristic and distribution of pain have been otherwise stable.   History Krista Kennedy has a past medical history of Cardiomyopathy ([place]); Hyperlipidemia; Hypertension; Psoriasis; and Thyroid disease.   She has a past surgical history that includes Thyroidectomy; Tonsillectomy; Tubal ligation; and Endometrial ablation (2015).   Her family history  includes Heart disease in her father; Hyperlipidemia in her father and mother; Hypertension in her father and mother.She reports that she has quit smoking. Her smoking use included Cigarettes. She has a 3.75 pack-year smoking history. She has never used smokeless tobacco. She reports that she does not drink alcohol or use drugs.  No results found for this or any previous visit.  [facility]ox[employer] Select 13  Date Value Ref Range Status  06/27/2016 FINAL  Final    Comment:    ==================================================================== TOX[facility]SU[facility] SELECT 13 (MW) ==================================================================== Test                             Result       Flag       Units Drug Present and Declared for Prescription Verification   Oxycodone                      >3704        EXPECTED   ng/mg creat   Oxymorphone                    3554         EXPECTED   ng/mg creat   Noroxycodone                   >3704        EXPECTED   ng/mg creat   Noroxymorphone                 872  EXPECTED   ng/mg creat    Sources of oxycodone are scheduled prescription medications.    Oxymorphone, noroxycodone, and noroxymorphone are expected    metabolites of oxycodone. Oxymorphone is also available as a    scheduled prescription medication. ==================================================================== Test                      Result    Flag   Units      Ref Range   Creatinine              270              mg/dL      >=20 ==================================================================== Declared Medications:  The flagging and interpretation on this report are based on the  following declared medications.  Unexpected results may arise from  inaccuracies in the declared medications.  **Note: The testing scope of this panel includes these medications:  Oxycodone  Oxycodone (Percocet)  **Note: The testing scope of this panel does not include following  reported medications:   Acetaminophen (Percocet)  Betamethasone (Diprolene)  Carvedilol (Coreg)  Cholecalciferol  Clindamycin (Cleocin)  Digoxin  Fluticasone (Flonase)  Furosemide (Lasix)  Levothyroxine  Lisinopril  Meloxicam (Mobic)  Metronidazole (Flagyl)  Olopatadine (patanol)  Topical  Topical (Lidex)  Venlafaxine  Vitamin C ==================================================================== For clinical consultation, please call [phone]. ====================================================================     Outpatient Medications Prior to Visit  Medication Sig Dispense Refill  . Ascorbic Acid (VITAMIN C) 1000 MG tablet Take 1,000 mg by mouth daily.    . betamethasone dipropionate (DIPROLENE) 0.05 % ointment   0  . carvedilol (COREG) 25 MG tablet Take 1 tablet (25 mg total) by mouth 2 (two) times daily with a meal. 180 tablet 3  . Cholecalciferol (VITAMIN D-1000 MAX ST) 1000 UNITS tablet Take by mouth.    . clindamycin (CLEOCIN) 150 MG capsule Take 1 capsule (150 mg total) by mouth daily. 30 capsule 11  . digoxin (LANOXIN) 0.25 MG tablet Take 1 tablet (0.25 mg total) by mouth daily. 90 tablet 3  . fluocinonide (LIDEX) 0.05 % external solution   0  . fluticasone (FLONASE) 50 MCG/ACT nasal spray Place 2 sprays into both nostrils daily. 16 g 3  . furosemide (LASIX) 40 MG tablet Take 40 mg by mouth. Reported on 01/13/2016    . Ixekizumab (TALTZ) 80 MG/ML SOSY Inject 80 mg into the skin as directed.    Marland Kitchen levothyroxine (SYNTHROID, LEVOTHROID) 125 MCG tablet Take 1 tablet (125 mcg total) by mouth daily. 90 tablet 3  . lisinopril (PRINIVIL,ZESTRIL) 5 MG tablet Take 1 tablet (5 mg total) by mouth daily. 90 tablet 3  . meloxicam (MOBIC) 7.5 MG tablet Take by mouth daily.     . metroNIDAZOLE (FLAGYL) 500 MG tablet Take 1 tablet (500 mg total) by mouth 2 (two) times daily. 21 tablet 0  . olopatadine (PATANOL) 0.1 % ophthalmic solution Place 1 drop into both eyes 2 (two) times daily. 15 mL 3  .  Ustekinumab (STELARA) 45 MG/0.5ML SOLN Inject into the skin every 3 (three) months. Reported on 01/13/2016    . venlafaxine XR (EFFEXOR-XR) 75 MG 24 hr capsule Take 1 capsule (75 mg total) by mouth daily with breakfast. 90 capsule 3  . oxyCODONE 30 MG 12 hr tablet Take 1 tablet by mouth every 12 (twelve) hours. 60 each 0  . oxyCODONE-acetaminophen (PERCOCET) 10-325 MG tablet Take 1 tablet by mouth every 6 (six) hours as needed for pain. 120 tablet  0   No facility-administered medications prior to visit.    Lab Results  Component Value Date   WBC 6.4 04/30/2013   HGB 12.2 04/30/2013   HCT 36.3 04/30/2013   PLT 222 09/09/2012   GLUCOSE 199 (H) 03/10/2016   CHOL 194 03/10/2016   TRIG 176 (H) 03/10/2016   HDL 46 03/10/2016   LDLCALC 113 03/10/2016   ALT 11 03/10/2016   AST 17 03/10/2016   NA 136 03/10/2016   K 3.5 03/10/2016   CL 104 03/10/2016   CREATININE 0.77 03/10/2016   BUN 10 03/10/2016   CO2 24 03/10/2016   TSH 0.24 (L) 03/10/2016    --------------------------------------------------------------------------------------------------------------------- Dg Chest 2 View  Result Date: 1/5/201 PRIOR REPORT IMPORTED FROM AN EXTERNAL SYSTEM  PRIOR REPORT IMPORTED FROM THE SYNGO WORKFLOW SYSTEM REASON FOR EXAM:    SOB COMMENTS: PROCEDURE:     DXR - DXR CHEST PA (OR AP) AND LATERAL  - Sep 09 2012  1:57PM RESULT:     The lungs are clear. The cardiac silhouette and visualized bony skeleton are unremarkable. IMPRESSION: 1. Chest radiograph without evidence of acute cardiopulmonary disease.        ---------------------------------------------------------------------------------------------------------------------- Past Medical History:  Diagnosis Date  . Cardiomyopathy ([place])   . Hyperlipidemia   . Hypertension   . Psoriasis   . Thyroid disease     Past Surgical History:  Procedure Laterality Date  . ENDOMETRIAL ABLATION  2015  . THYROIDECTOMY    . TONSILLECTOMY    . TUBAL  LIGATION      Family History  Problem Relation Age of Onset  . Hypertension Mother   . Hyperlipidemia Mother   . Heart disease Father     CABG   . Hyperlipidemia Father   . Hypertension Father     Social History  Substance Use Topics  . Smoking status: Former Smoker    Packs/day: 0.25    Years: 15.00    Types: Cigarettes  . Smokeless tobacco: Never Used  . Alcohol use No    ---------------------------------------------------------------------------------------------------------------------- Social History   Social History  . Marital status: Married    Spouse name: N/A  . Number of children: N/A  . Years of education: N/A   Social History Main Topics  . Smoking status: Former Smoker    Packs/day: 0.25    Years: 15.00    Types: Cigarettes  . Smokeless tobacco: Never Used  . Alcohol use No  . Drug use: No  . Sexual activity: Yes   Other Topics Concern  . None   Social History Narrative  . None    Scheduled Meds: Continuous Infusions: PRN Meds:.   BP 120/74   Pulse 72   Temp 97.9 F (36.6 C) (Oral)   Resp 16   Ht 5\' 10"  (1.778 m)   Wt 160 lb (72.6 kg)   SpO2 98%   BMI 22.96 kg/m    BP Readings from Last 3 Encounters:  07/19/16 120/74  06/27/16 (!) 141/74  06/02/16 (!) 159/67     Wt Readings from Last 3 Encounters:  07/19/16 160 lb (72.6 kg)  06/27/16 160 lb (72.6 kg)  06/02/16 160 lb (72.6 kg)     ----------------------------------------------------------------------------------------------------------------------  ROS Review of Systems as per chart  Objective:  BP 120/74   Pulse 72   Temp 97.9 F (36.6 C) (Oral)   Resp 16   Ht 5\' 10"  (1.778 m)   Wt 160 lb (72.6 kg)   SpO2 98%  BMI 22.96 kg/m   Physical Exam patient is alert cooperative compliant Pupils are equally round reactive to light extraocular muscles intact Heart is regular rate and rhythm without murmur Lungs are clear to Auscultation She is ambulating  well    Assessment & Plan:   Diagnoses and all orders for this visit:  Pain in multiple finger joints  Chronic pain syndrome -     oxyCODONE 30 MG 12 hr tablet; Take 1 tablet by mouth every 12 (twelve) hours.  Psoriatic arthritis (HCC)  Hip pain, bilateral  Psoriasis with arthropathy (HCC) -     oxyCODONE 30 MG 12 hr tablet; Take 1 tablet by mouth every 12 (twelve) hours. -     oxyCODONE-acetaminophen (PERCOCET) 10-325 MG tablet; Take 1 tablet by mouth every 6 (six) hours as needed for pain.     ----------------------------------------------------------------------------------------------------------------------  Problem List Items Addressed This Visit      Musculoskeletal and Integument   Psoriasis with arthropathy (HCC)   Relevant Medications   oxyCODONE 30 MG 12 hr tablet   oxyCODONE-acetaminophen (PERCOCET) 10-325 MG tablet   Psoriatic arthritis (HCC)     Other   Chronic pain syndrome   Relevant Medications   oxyCODONE 30 MG 12 hr tablet   Hip pain, bilateral   Pain in multiple finger joints - Primary      ----------------------------------------------------------------------------------------------------------------------  1. Psoriatic arthritis ([place]) We'll continue with her current regimen as this seems to be well received and keeping her her pain under control. Her overall functional status is reportedly improved with the medications. No significant side effects are mentioned today. 2. Chronic pain syndrome As above as above. She has not had a previous lumbar MRI however her low back pain has been stable in nature with no reports of lower extremity weakness or bowel bladder dysfunction and think that it is reasonable to defer on this examination at this time.  3. Hip pain, bilateral As above  4. Pain in multiple finger joints As above.....Marland Kitchen overall we will refill her medications for one month with return to clinic in 1 month. If she should have any  problems with her pain medication management she is instructed to contact us for assistance. I want her to continue follow-up with her rheumatologist and primary care physicians for her baseline rheumatologic and medical management. We will continue to write for her pain medications and monitor these.    ----------------------------------------------------------------------------------------------------------------------  I am having Krista Kennedy maintain her furosemide, Ustekinumab, vitamin C, Cholecalciferol, digoxin, lisinopril, clindamycin, meloxicam, fluticasone, olopatadine, metroNIDAZOLE, betamethasone dipropionate, fluocinonide, levothyroxine, venlafaxine XR, Ixekizumab, carvedilol, oxyCODONE, and oxyCODONE-acetaminophen.   Meds ordered this encounter  Medications  . oxyCODONE 30 MG 12 hr tablet    Sig: Take 1 tablet by mouth every 12 (twelve) hours.    Dispense:  60 each    Refill:  0    Do not fill until [id number]  . oxyCODONE-acetaminophen (PERCOCET) 10-325 MG tablet    Sig: Take 1 tablet by mouth every 6 (six) hours as needed for pain.    Dispense:  120 tablet    Refill:  0    Do not fill until [id number]       Follow-up: Return in about 2 months (around 09/18/2016).    Molli Barrows, MD  This dictation was performed utilizing Dragon voice recognition software.  Please excuse any unintentional or mistaken typographical errors as a result of its unedited utilization.

## 2016-07-19 NOTE — Progress Notes (Signed)
Safety precautions to be maintained throughout the outpatient stay will include: orient to surroundings, keep bed in low position, maintain call bell within reach at all times, provide assistance with transfer out of bed and ambulation.  

## 2016-07-21 ENCOUNTER — Encounter: Payer: Commercial Managed Care - PPO | Admitting: Anesthesiology

## 2016-07-27 ENCOUNTER — Telehealth: Payer: Self-pay | Admitting: Family Medicine

## 2016-07-27 DIAGNOSIS — F3341 Major depressive disorder, recurrent, in partial remission: Secondary | ICD-10-CM

## 2016-07-27 MED ORDER — VENLAFAXINE HCL ER 75 MG PO CP24: 75.0000 mg | ORAL_CAPSULE | Freq: Every day | ORAL | 3 refills | Status: DC

## 2016-07-27 NOTE — Telephone Encounter (Signed)
Pt needs a refill on effexor sent to [facility].  Her call back number is [phone]

## 2016-07-27 NOTE — Telephone Encounter (Signed)
Refill Effexor 75mg  24hr capsule sent to [facility], #[phone] +3 refills

## 2016-08-05 ENCOUNTER — Telehealth: Payer: Self-pay | Admitting: Family Medicine

## 2016-08-05 ENCOUNTER — Other Ambulatory Visit: Payer: Self-pay | Admitting: Family Medicine

## 2016-08-05 DIAGNOSIS — I5022 Chronic systolic (congestive) heart failure: Secondary | ICD-10-CM

## 2016-08-05 DIAGNOSIS — E079 Disorder of thyroid, unspecified: Secondary | ICD-10-CM

## 2016-08-05 DIAGNOSIS — I10 Essential (primary) hypertension: Secondary | ICD-10-CM

## 2016-08-05 MED ORDER — LEVOTHYROXINE SODIUM 125 MCG PO TABS: 125.0000 ug | ORAL_TABLET | Freq: Every day | ORAL | 3 refills | Status: DC

## 2016-08-05 MED ORDER — DIGOXIN 250 MCG PO TABS: 0.2500 mg | ORAL_TABLET | Freq: Every day | ORAL | 1 refills | Status: DC

## 2016-08-05 MED ORDER — LISINOPRIL 5 MG PO TABS: 5.0000 mg | ORAL_TABLET | Freq: Every day | ORAL | 3 refills | Status: DC

## 2016-08-05 NOTE — Telephone Encounter (Signed)
Pt. Called requesting a refill on :  Called  Into  [facility] in  [place] . Pt. Call back # is [phone]  Lisinopril, Synthroid, Digoxin

## 2016-08-05 NOTE — Telephone Encounter (Signed)
Rx send

## 2016-08-24 ENCOUNTER — Other Ambulatory Visit: Payer: Self-pay | Admitting: Family Medicine

## 2016-08-24 DIAGNOSIS — I10 Essential (primary) hypertension: Secondary | ICD-10-CM

## 2016-08-24 MED ORDER — LISINOPRIL 5 MG PO TABS: 5.0000 mg | ORAL_TABLET | Freq: Every day | ORAL | 0 refills | Status: DC

## 2016-08-26 ENCOUNTER — Ambulatory Visit: Payer: Commercial Managed Care - PPO | Attending: Anesthesiology | Admitting: Anesthesiology

## 2016-08-26 ENCOUNTER — Encounter: Payer: Self-pay | Admitting: Anesthesiology

## 2016-08-26 VITALS — BP 146/68 | HR 75 | Temp 98.0°F | Resp 16 | Ht 70.0 in | Wt 160.0 lb

## 2016-08-26 DIAGNOSIS — E079 Disorder of thyroid, unspecified: Secondary | ICD-10-CM | POA: Diagnosis not present

## 2016-08-26 DIAGNOSIS — G894 Chronic pain syndrome: Secondary | ICD-10-CM | POA: Diagnosis not present

## 2016-08-26 DIAGNOSIS — L4050 Arthropathic psoriasis, unspecified: Secondary | ICD-10-CM | POA: Diagnosis not present

## 2016-08-26 DIAGNOSIS — I429 Cardiomyopathy, unspecified: Secondary | ICD-10-CM | POA: Insufficient documentation

## 2016-08-26 DIAGNOSIS — M25552 Pain in left hip: Secondary | ICD-10-CM | POA: Diagnosis present

## 2016-08-26 DIAGNOSIS — M79676 Pain in unspecified toe(s): Secondary | ICD-10-CM | POA: Diagnosis not present

## 2016-08-26 DIAGNOSIS — M79643 Pain in unspecified hand: Secondary | ICD-10-CM | POA: Diagnosis present

## 2016-08-26 DIAGNOSIS — I10 Essential (primary) hypertension: Secondary | ICD-10-CM | POA: Insufficient documentation

## 2016-08-26 DIAGNOSIS — M25551 Pain in right hip: Secondary | ICD-10-CM | POA: Diagnosis not present

## 2016-08-26 DIAGNOSIS — Z87891 Personal history of nicotine dependence: Secondary | ICD-10-CM | POA: Insufficient documentation

## 2016-08-26 DIAGNOSIS — E785 Hyperlipidemia, unspecified: Secondary | ICD-10-CM | POA: Diagnosis not present

## 2016-08-26 DIAGNOSIS — M25549 Pain in joints of unspecified hand: Secondary | ICD-10-CM | POA: Diagnosis not present

## 2016-08-26 MED ORDER — OXYCODONE HCL ER 30 MG PO T12A: 1.0000 | EXTENDED_RELEASE_TABLET | Freq: Two times a day (BID) | ORAL | 0 refills | Status: DC

## 2016-08-26 MED ORDER — OXYCODONE-ACETAMINOPHEN 10-325 MG PO TABS: 1.0000 | ORAL_TABLET | Freq: Four times a day (QID) | ORAL | 0 refills | Status: DC | PRN

## 2016-08-26 MED ORDER — NALOXONE HCL 4 MG/0.1ML NA LIQD: NASAL | 1 refills | Status: DC

## 2016-08-26 NOTE — Progress Notes (Signed)
Safety precautions to be maintained throughout the outpatient stay will include: orient to surroundings, keep bed in low position, maintain call bell within reach at all times, provide assistance with transfer out of bed and ambulation.  

## 2016-08-27 NOTE — Progress Notes (Signed)
Subjective:  Patient ID: Asti Masterson, female    DOB: 1965/11/02  Age: 50 y.o. MRN: [id number]  CC: Hip Pain (bilaterally); Hand Pain; and Toe Pain Diffuse body pain affecting the hips low back with feet  Service Provided on Last Visit: Med Refill  PROCEDURE:None  HPI Deijah Cypert is here for reevaluation last seen in November. The quality characteristic and distribution of her pain remains stable. She is tolerating her medications without side effects. She continues to report that her overall lifestyle function is improved with chronic opioids. We have gone over the risks and benefits of opioid therapy. I have reviewed her position database today. Her last urine tox screen has also been reviewed. She maintains that she's been compliant with her medications with no diverting or illicit use. She continues to have primarily left hip and finger diffuse joint pain.  At baseline her history describes a gnawing aching pain in the low back that radiates into both hips. It's present throughout the mainstay of the day and even despite the medications the pain is severe and unremitting. She describes a maximum VAS score that can be between an 8 and 10 and an excruciating nature to the pain. She states that she is never pain-free. She really has no associated low back pain but does have pain that radiates from the hip on both sides into the posterior lateral legs. No associated weakness is reported today. She furthermore reports no side effects with the regimen of OxyContin and Percocet with no reported sedation or somnolence. This has all been stable in nature. This was initially managed by her primary care physician. She was later evaluated by a rheumatologist and is due for follow-up.  Since her last visit she reports no significant changes. The quality characteristic and distribution of pain have been otherwise stable.   History Adamary has a past medical history of Cardiomyopathy ([place]);  Hyperlipidemia; Hypertension; Psoriasis; and Thyroid disease.   She has a past surgical history that includes Thyroidectomy; Tonsillectomy; Tubal ligation; and Endometrial ablation (2015).   Her family history includes Heart disease in her father; Hyperlipidemia in her father and mother; Hypertension in her father and mother.She reports that she has quit smoking. Her smoking use included Cigarettes. She has a 3.75 pack-year smoking history. She has never used smokeless tobacco. She reports that she does not drink alcohol or use drugs.  No results found for this or any previous visit.  Tox[employer] Select 13  Date Value Ref Range Status  06/27/2016 FINAL  Final    Comment:    ==================================================================== TOXASSU[facility] SELECT 13 (MW) ==================================================================== Test                             Result       Flag       Units Drug Present and Declared for Prescription Verification   Oxycodone                      >3704        EXPECTED   ng/mg creat   Oxymorphone                    3554         EXPECTED   ng/mg creat   Noroxycodone                   >3704        EXPECTED  ng/mg creat   Noroxymorphone                 872          EXPECTED   ng/mg creat    Sources of oxycodone are scheduled prescription medications.    Oxymorphone, noroxycodone, and noroxymorphone are expected    metabolites of oxycodone. Oxymorphone is also available as a    scheduled prescription medication. ==================================================================== Test                      Result    Flag   Units      Ref Range   Creatinine              270              mg/dL      >=20 ==================================================================== Declared Medications:  The flagging and interpretation on this report are based on the  following declared medications.  Unexpected results may arise from  inaccuracies in the declared  medications.  **Note: The testing scope of this panel includes these medications:  Oxycodone  Oxycodone (Percocet)  **Note: The testing scope of this panel does not include following  reported medications:  Acetaminophen (Percocet)  Betamethasone (Diprolene)  Carvedilol (Coreg)  Cholecalciferol  Clindamycin (Cleocin)  Digoxin  Fluticasone (Flonase)  Furosemide (Lasix)  Levothyroxine  Lisinopril  Meloxicam (Mobic)  Metronidazole (Flagyl)  Olopatadine (patanol)  Topical  Topical (Lidex)  Venlafaxine  Vitamin C ==================================================================== For clinical consultation, please call [phone]. ====================================================================     Outpatient Medications Prior to Visit  Medication Sig Dispense Refill  . Ascorbic Acid (VITAMIN C) 1000 MG tablet Take 1,000 mg by mouth daily.    . betamethasone dipropionate (DIPROLENE) 0.05 % ointment   0  . carvedilol (COREG) 25 MG tablet Take 1 tablet (25 mg total) by mouth 2 (two) times daily with a meal. 180 tablet 3  . Cholecalciferol (VITAMIN D-1000 MAX ST) 1000 UNITS tablet Take by mouth.    . clindamycin (CLEOCIN) 150 MG capsule Take 1 capsule (150 mg total) by mouth daily. 30 capsule 11  . digoxin (LANOXIN) 0.25 MG tablet Take 1 tablet (0.25 mg total) by mouth daily. 90 tablet 1  . fluocinonide (LIDEX) 0.05 % external solution   0  . fluticasone (FLONASE) 50 MCG/ACT nasal spray Place 2 sprays into both nostrils daily. 16 g 3  . furosemide (LASIX) 40 MG tablet Take 40 mg by mouth. Reported on 01/13/2016    . Ixekizumab (TALTZ) 80 MG/ML SOSY Inject 80 mg into the skin as directed.    Marland Kitchen levothyroxine (SYNTHROID, LEVOTHROID) 125 MCG tablet Take 1 tablet (125 mcg total) by mouth daily. 90 tablet 3  . lisinopril (PRINIVIL,ZESTRIL) 5 MG tablet Take 1 tablet (5 mg total) by mouth daily. 90 tablet 0  . meloxicam (MOBIC) 7.5 MG tablet Take by mouth daily.     . metroNIDAZOLE  (FLAGYL) 500 MG tablet Take 1 tablet (500 mg total) by mouth 2 (two) times daily. 21 tablet 0  . olopatadine (PATANOL) 0.1 % ophthalmic solution Place 1 drop into both eyes 2 (two) times daily. 15 mL 3  . Ustekinumab (STELARA) 45 MG/0.5ML SOLN Inject into the skin every 3 (three) months. Reported on 01/13/2016    . venlafaxine XR (EFFEXOR-XR) 75 MG 24 hr capsule Take 1 capsule (75 mg total) by mouth daily with breakfast. 90 capsule 3  . oxyCODONE 30 MG 12 hr tablet Take 1 tablet  by mouth every 12 (twelve) hours. 60 each 0  . oxyCODONE-acetaminophen (PERCOCET) 10-325 MG tablet Take 1 tablet by mouth every 6 (six) hours as needed for pain. 120 tablet 0   No facility-administered medications prior to visit.    Lab Results  Component Value Date   WBC 6.4 04/30/2013   HGB 12.2 04/30/2013   HCT 36.3 04/30/2013   PLT 222 09/09/2012   GLUCOSE 199 (H) 03/10/2016   CHOL 194 03/10/2016   TRIG 176 (H) 03/10/2016   HDL 46 03/10/2016   LDLCALC 113 03/10/2016   ALT 11 03/10/2016   AST 17 03/10/2016   NA 136 03/10/2016   K 3.5 03/10/2016   CL 104 03/10/2016   CREATININE 0.77 03/10/2016   BUN 10 03/10/2016   CO2 24 03/10/2016   TSH 0.24 (L) 03/10/2016    --------------------------------------------------------------------------------------------------------------------- Dg Chest 2 View  Result Date: 1/5/201 PRIOR REPORT IMPORTED FROM AN EXTERNAL SYSTEM  PRIOR REPORT IMPORTED FROM THE [facility] WORKFLOW SYSTEM REASON FOR EXAM:    SOB COMMENTS: PROCEDURE:     DXR - DXR CHEST PA (OR AP) AND LATERAL  - Sep 09 2012  1:57PM RESULT:     The lungs are clear. The cardiac silhouette and visualized bony skeleton are unremarkable. IMPRESSION: 1. Chest radiograph without evidence of acute cardiopulmonary disease.        ---------------------------------------------------------------------------------------------------------------------- Past Medical History:  Diagnosis Date  . Cardiomyopathy ([place])   .  Hyperlipidemia   . Hypertension   . Psoriasis   . Thyroid disease     Past Surgical History:  Procedure Laterality Date  . ENDOMETRIAL ABLATION  2015  . THYROIDECTOMY    . TONSILLECTOMY    . TUBAL LIGATION      Family History  Problem Relation Age of Onset  . Hypertension Mother   . Hyperlipidemia Mother   . Heart disease Father     CABG   . Hyperlipidemia Father   . Hypertension Father     Social History  Substance Use Topics  . Smoking status: Former Smoker    Packs/day: 0.25    Years: 15.00    Types: Cigarettes  . Smokeless tobacco: Never Used  . Alcohol use No    ---------------------------------------------------------------------------------------------------------------------- Social History   Social History  . Marital status: Married    Spouse name: N/A  . Number of children: N/A  . Years of education: N/A   Social History Main Topics  . Smoking status: Former Smoker    Packs/day: 0.25    Years: 15.00    Types: Cigarettes  . Smokeless tobacco: Never Used  . Alcohol use No  . Drug use: No  . Sexual activity: Yes   Other Topics Concern  . None   Social History Narrative  . None    Scheduled Meds: Continuous Infusions: PRN Meds:.   BP (!) 146/68   Pulse 75   Temp 98 F (36.7 C)   Resp 16   Ht _0  (1.778 m)   Wt 160 lb (72.6 kg)   SpO2 98%   BMI 22.96 kg/m    BP Readings from Last 3 Encounters:  08/26/16 (!) 146/68  07/19/16 120/74  06/27/16 (!) 141/74     Wt Readings from Last 3 Encounters:  08/26/16 160 lb (72.6 kg)  07/19/16 160 lb (72.6 kg)  06/27/16 160 lb (72.6 kg)     ----------------------------------------------------------------------------------------------------------------------  ROS Review of Systems as per chart GI: No constipation Cardiac: No angina or shortness of breath  or excessive fatigue  Objective:  BP (!) 146/68   Pulse 75   Temp 98 F (36.7 C)   Resp 16   Ht _0  (1.778 m)   Wt  160 lb (72.6 kg)   SpO2 98%   BMI 22.96 kg/m   Physical Exam patient is alert cooperative compliant Pupils are equally round reactive to light extraocular muscles intact Heart is regular rate and rhythm without murmur Lungs are clear to Auscultation Her strength appears to be a baseline    Assessment & Plan:   Marvia was seen today for hip pain, hand pain and toe pain.  Diagnoses and all orders for this visit:  Chronic pain syndrome -     oxyCODONE 30 MG 12 hr tablet; Take 1 tablet by mouth every 12 (twelve) hours.  Pain in multiple finger joints  Psoriatic arthritis (HCC)  Hip pain, bilateral  Psoriasis with arthropathy (HCC) -     oxyCODONE 30 MG 12 hr tablet; Take 1 tablet by mouth every 12 (twelve) hours. -     oxyCODONE-acetaminophen (PERCOCET) 10-325 MG tablet; Take 1 tablet by mouth every 6 (six) hours as needed for pain.  Other orders -     naloxone (NARCAN) nasal spray 4 mg/0.1 mL; To reverse excess sedation from narcotics     ----------------------------------------------------------------------------------------------------------------------  Problem List Items Addressed This Visit      Musculoskeletal and Integument   Psoriasis with arthropathy (HCC)   Relevant Medications   oxyCODONE 30 MG 12 hr tablet   oxyCODONE-acetaminophen (PERCOCET) 10-325 MG tablet   Psoriatic arthritis (HCC)     Other   Chronic pain syndrome - Primary   Relevant Medications   oxyCODONE 30 MG 12 hr tablet   Hip pain, bilateral   Pain in multiple finger joints      ----------------------------------------------------------------------------------------------------------------------  1. Psoriatic arthritis ([place]) I will refill her medications with return to clinic in 1 month. Though she is having no problems with her medication I'm going to give her a prophylactic emergency Narcan prescription as reviewed with her today in its use.  2. Chronic pain syndrome As above as  above. She has not had a previous lumbar MRI however her low back pain has been stable in nature with no reports of lower extremity weakness or bowel bladder dysfunction and think that it is reasonable to defer on this examination at this time.  3. Hip pain, bilateral As above I do want her to continue follow-up with her orthopedic doctors in regards to the chronic follow-up of her reported hip pain.  4. Pain in multiple finger joints As above.....Marland Kitchen overall we will refill her medications for one month with return to clinic in 1 month. Unfortunately she has failed conservative therapy and I do not feel that there are any other interventional therapies that I have to offer that would be of benefit. We have once again reviewed the risks and benefit profile of chronic opioid management and she understands these. If she should have any problems with her pain medication management she is instructed to contact us for assistance. I want her to continue follow-up with her rheumatologist and primary care physicians for her baseline rheumatologic and medical management. We will continue to write for her pain medications and monitor these.    ----------------------------------------------------------------------------------------------------------------------  I am having Ms. Cade start on naloxone. I am also having her maintain her furosemide, Ustekinumab, vitamin C, Cholecalciferol, clindamycin, meloxicam, fluticasone, olopatadine, metroNIDAZOLE, betamethasone dipropionate, fluocinonide, Ixekizumab, carvedilol, venlafaxine XR, digoxin,  levothyroxine, lisinopril, oxyCODONE, and oxyCODONE-acetaminophen.   Meds ordered this encounter  Medications  . naloxone (NARCAN) nasal spray 4 mg/0.1 mL    Sig: To reverse excess sedation from narcotics    Dispense:  2 kit    Refill:  1  . oxyCODONE 30 MG 12 hr tablet    Sig: Take 1 tablet by mouth every 12 (twelve) hours.    Dispense:  60 each    Refill:  0    Do  not fill until [id number]  . oxyCODONE-acetaminophen (PERCOCET) 10-325 MG tablet    Sig: Take 1 tablet by mouth every 6 (six) hours as needed for pain.    Dispense:  120 tablet    Refill:  0    Do not fill until [id number]       Follow-up: Return in about 1 month (around 09/26/2016) for evaluation, med refill.    Molli Barrows, MD  This dictation was performed utilizing Dragon voice recognition software.  Please excuse any unintentional or mistaken typographical errors as a result of its unedited utilization.

## 2016-09-20 ENCOUNTER — Ambulatory Visit: Payer: Commercial Managed Care - PPO | Attending: Anesthesiology | Admitting: Anesthesiology

## 2016-09-20 ENCOUNTER — Encounter: Payer: Self-pay | Admitting: Anesthesiology

## 2016-09-20 VITALS — BP 158/89 | HR 77 | Temp 98.2°F | Resp 18 | Ht 70.0 in | Wt 160.0 lb

## 2016-09-20 DIAGNOSIS — M545 Low back pain: Secondary | ICD-10-CM | POA: Insufficient documentation

## 2016-09-20 DIAGNOSIS — M25549 Pain in joints of unspecified hand: Secondary | ICD-10-CM

## 2016-09-20 DIAGNOSIS — E785 Hyperlipidemia, unspecified: Secondary | ICD-10-CM | POA: Insufficient documentation

## 2016-09-20 DIAGNOSIS — L4050 Arthropathic psoriasis, unspecified: Secondary | ICD-10-CM

## 2016-09-20 DIAGNOSIS — G894 Chronic pain syndrome: Secondary | ICD-10-CM | POA: Diagnosis not present

## 2016-09-20 DIAGNOSIS — M25551 Pain in right hip: Secondary | ICD-10-CM | POA: Diagnosis not present

## 2016-09-20 DIAGNOSIS — L409 Psoriasis, unspecified: Secondary | ICD-10-CM | POA: Diagnosis not present

## 2016-09-20 DIAGNOSIS — Z79899 Other long term (current) drug therapy: Secondary | ICD-10-CM | POA: Insufficient documentation

## 2016-09-20 DIAGNOSIS — I429 Cardiomyopathy, unspecified: Secondary | ICD-10-CM | POA: Insufficient documentation

## 2016-09-20 DIAGNOSIS — E890 Postprocedural hypothyroidism: Secondary | ICD-10-CM | POA: Insufficient documentation

## 2016-09-20 DIAGNOSIS — Z87891 Personal history of nicotine dependence: Secondary | ICD-10-CM | POA: Insufficient documentation

## 2016-09-20 DIAGNOSIS — I10 Essential (primary) hypertension: Secondary | ICD-10-CM | POA: Insufficient documentation

## 2016-09-20 DIAGNOSIS — M25552 Pain in left hip: Secondary | ICD-10-CM

## 2016-09-20 DIAGNOSIS — Z8249 Family history of ischemic heart disease and other diseases of the circulatory system: Secondary | ICD-10-CM | POA: Insufficient documentation

## 2016-09-20 MED ORDER — OXYCODONE-ACETAMINOPHEN 10-325 MG PO TABS: 1.0000 | ORAL_TABLET | Freq: Four times a day (QID) | ORAL | 0 refills | Status: DC | PRN

## 2016-09-20 MED ORDER — OXYCODONE HCL ER 30 MG PO T12A: 1.0000 | EXTENDED_RELEASE_TABLET | Freq: Two times a day (BID) | ORAL | 0 refills | Status: DC

## 2016-09-20 NOTE — Progress Notes (Signed)
Safety precautions to be maintained throughout the outpatient stay will include: orient to surroundings, keep bed in low position, maintain call bell within reach at all times, provide assistance with transfer out of bed and ambulation.  

## 2016-09-21 NOTE — Progress Notes (Signed)
Subjective:  Patient ID: Krista Kennedy, female    DOB: [birth year]  Age: 51 y.o. MRN: [id number]  CC: Joint Pain ("all my joints hurt") and Hand Pain (thumb on left hand) Diffuse body pain affecting the hips low back with feet  Service Provided on Last Visit: Med Refill, Evaluation  PROCEDURE:None  HPI Krista Kennedy presents for reevaluation. The quality characteristic and distribution of her pain are otherwise unchanged. Based on her narcotic assessment sheet she continues to do well with her current medication regimen. She she states that she is doing well with the medications and that they do improve her overall lifestyle function and she's having few side effects with them. I've also reviewed the practitioner database and it is appropriate.   At baseline her history describes a gnawing aching pain in the low back that radiates into both hips. It's present throughout the mainstay of the day and even despite the medications the pain is severe and unremitting. She describes a maximum VAS score that can be between an 8 and 10 and an excruciating nature to the pain. She states that she is never pain-free. She really has no associated low back pain but does have pain that radiates from the hip on both sides into the posterior lateral legs. No associated weakness is reported today. She furthermore reports no side effects with the regimen of OxyContin and Percocet with no reported sedation or somnolence. This has all been stable in nature. This was initially managed by her primary care physician. She was later evaluated by a rheumatologist and is due for follow-up.  Since her last visit she reports no significant changes. The quality characteristic and distribution of pain have been otherwise stable.   History Krista Kennedy has a past medical history of Cardiomyopathy ([place]); Hyperlipidemia; Hypertension; Psoriasis; and Thyroid disease.   She has a past surgical history that includes Thyroidectomy;  Tonsillectomy; Tubal ligation; and Endometrial ablation (2015).   Her family history includes Heart disease in her father; Hyperlipidemia in her father and mother; Hypertension in her father and mother.She reports that she has quit smoking. Her smoking use included Cigarettes. She has a 3.75 pack-year smoking history. She has never used smokeless tobacco. She reports that she does not drink alcohol or use drugs.  No results found for this or any previous visit.  [facility] Select 13  Date Value Ref Range Status  06/27/2016 FINAL  Final    Comment:    ==================================================================== [facility] SELECT 13 (MW) ==================================================================== Test                             Result       Flag       Units Drug Present and Declared for Prescription Verification   Oxycodone                      >3704        EXPECTED   ng/mg creat   Oxymorphone                    3554         EXPECTED   ng/mg creat   Noroxycodone                   >3704        EXPECTED   ng/mg creat   Noroxymorphone  872          EXPECTED   ng/mg creat    Sources of oxycodone are scheduled prescription medications.    Oxymorphone, noroxycodone, and noroxymorphone are expected    metabolites of oxycodone. Oxymorphone is also available as a    scheduled prescription medication. ==================================================================== Test                      Result    Flag   Units      Ref Range   Creatinine              270              mg/dL      >=20 ==================================================================== Declared Medications:  The flagging and interpretation on this report are based on the  following declared medications.  Unexpected results may arise from  inaccuracies in the declared medications.  **Note: The testing scope of this panel includes these medications:  Oxycodone  Oxycodone (Percocet)  **Note: The  testing scope of this panel does not include following  reported medications:  Acetaminophen (Percocet)  Betamethasone (Diprolene)  Carvedilol (Coreg)  Cholecalciferol  Clindamycin (Cleocin)  Digoxin  Fluticasone (Flonase)  Furosemide (Lasix)  Levothyroxine  Lisinopril  Meloxicam (Mobic)  Metronidazole (Flagyl)  Olopatadine (patanol)  Topical  Topical (Lidex)  Venlafaxine  Vitamin C ==================================================================== For clinical consultation, please call [phone]. ====================================================================     Outpatient Medications Prior to Visit  Medication Sig Dispense Refill  . Ascorbic Acid (VITAMIN C) 1000 MG tablet Take 1,000 mg by mouth daily.    . betamethasone dipropionate (DIPROLENE) 0.05 % ointment   0  . carvedilol (COREG) 25 MG tablet Take 1 tablet (25 mg total) by mouth 2 (two) times daily with a meal. 180 tablet 3  . Cholecalciferol (VITAMIN D-1000 MAX ST) 1000 UNITS tablet Take by mouth.    . clindamycin (CLEOCIN) 150 MG capsule Take 1 capsule (150 mg total) by mouth daily. 30 capsule 11  . digoxin (LANOXIN) 0.25 MG tablet Take 1 tablet (0.25 mg total) by mouth daily. 90 tablet 1  . fluocinonide (LIDEX) 0.05 % external solution   0  . furosemide (LASIX) 40 MG tablet Take 40 mg by mouth. Reported on 01/13/2016    . Ixekizumab (TALTZ) 80 MG/ML SOSY Inject 80 mg into the skin as directed.    Marland Kitchen levothyroxine (SYNTHROID, LEVOTHROID) 125 MCG tablet Take 1 tablet (125 mcg total) by mouth daily. 90 tablet 3  . lisinopril (PRINIVIL,ZESTRIL) 5 MG tablet Take 1 tablet (5 mg total) by mouth daily. 90 tablet 0  . naloxone (NARCAN) nasal spray 4 mg/0.1 mL To reverse excess sedation from narcotics 2 kit 1  . venlafaxine XR (EFFEXOR-XR) 75 MG 24 hr capsule Take 1 capsule (75 mg total) by mouth daily with breakfast. 90 capsule 3  . oxyCODONE 30 MG 12 hr tablet Take 1 tablet by mouth every 12 (twelve) hours. 60  each 0  . oxyCODONE-acetaminophen (PERCOCET) 10-325 MG tablet Take 1 tablet by mouth every 6 (six) hours as needed for pain. 120 tablet 0  . fluticasone (FLONASE) 50 MCG/ACT nasal spray Place 2 sprays into both nostrils daily. (Patient not taking: Reported on 09/20/2016) 16 g 3  . meloxicam (MOBIC) 7.5 MG tablet Take by mouth daily.     . metroNIDAZOLE (FLAGYL) 500 MG tablet Take 1 tablet (500 mg total) by mouth 2 (two) times daily. (Patient not taking: Reported on 09/20/2016) 21 tablet 0  .  olopatadine (PATANOL) 0.1 % ophthalmic solution Place 1 drop into both eyes 2 (two) times daily. (Patient not taking: Reported on 09/20/2016) 15 mL 3  . Ustekinumab (STELARA) 45 MG/0.5ML SOLN Inject into the skin every 3 (three) months. Reported on 01/13/2016     No facility-administered medications prior to visit.    Lab Results  Component Value Date   WBC 6.4 04/30/2013   HGB 12.2 04/30/2013   HCT 36.3 04/30/2013   PLT 222 09/09/2012   GLUCOSE 199 (H) 03/10/2016   CHOL 194 03/10/2016   TRIG 176 (H) 03/10/2016   HDL 46 03/10/2016   LDLCALC 113 03/10/2016   ALT 11 03/10/2016   AST 17 03/10/2016   NA 136 03/10/2016   K 3.5 03/10/2016   CL 104 03/10/2016   CREATININE 0.77 03/10/2016   BUN 10 03/10/2016   CO2 24 03/10/2016   TSH 0.24 (L) 03/10/2016    --------------------------------------------------------------------------------------------------------------------- Dg Chest 2 View  Result Date: 1/5/201 PRIOR REPORT IMPORTED FROM AN EXTERNAL SYSTEM  PRIOR REPORT IMPORTED FROM THE [facility]NGO WORKFLOW SYSTEM REASON FOR EXAM:    SOB COMMENTS: PROCEDURE:     DXR - DXR CHEST PA (OR AP) AND LATERAL  - Sep 09 2012  1:57PM RESULT:     The lungs are clear. The cardiac silhouette and visualized bony skeleton are unremarkable. IMPRESSION: 1. Chest radiograph without evidence of acute cardiopulmonary disease.         ---------------------------------------------------------------------------------------------------------------------- Past Medical History:  Diagnosis Date  . Cardiomyopathy ([place])   . Hyperlipidemia   . Hypertension   . Psoriasis   . Thyroid disease     Past Surgical History:  Procedure Laterality Date  . ENDOMETRIAL ABLATION  2015  . THYROIDECTOMY    . TONSILLECTOMY    . TUBAL LIGATION      Family History  Problem Relation Age of Onset  . Hypertension Mother   . Hyperlipidemia Mother   . Heart disease Father     CABG   . Hyperlipidemia Father   . Hypertension Father     Social History  Substance Use Topics  . Smoking status: Former Smoker    Packs/day: 0.25    Years: 15.00    Types: Cigarettes  . Smokeless tobacco: Never Used  . Alcohol use No    ---------------------------------------------------------------------------------------------------------------------- Social History   Social History  . Marital status: Married    Spouse name: N/A  . Number of children: N/A  . Years of education: N/A   Social History Main Topics  . Smoking status: Former Smoker    Packs/day: 0.25    Years: 15.00    Types: Cigarettes  . Smokeless tobacco: Never Used  . Alcohol use No  . Drug use: No  . Sexual activity: Yes   Other Topics Concern  . None   Social History Narrative  . None    Scheduled Meds: Continuous Infusions: PRN Meds:.   BP (!) 158/89   Pulse 77   Temp 98.2 F (36.8 C) (Oral)   Resp 18   Ht _0  (1.778 m)   Wt 160 lb (72.6 kg)   SpO2 99%   BMI 22.96 kg/m    BP Readings from Last 3 Encounters:  09/20/16 (!) 158/89  08/26/16 (!) 146/68  07/19/16 120/74     Wt Readings from Last 3 Encounters:  09/20/16 160 lb (72.6 kg)  08/26/16 160 lb (72.6 kg)  07/19/16 160 lb (72.6 kg)     ----------------------------------------------------------------------------------------------------------------------  ROS Review of  Systems as per chart GI: No constipation Cardiac: No angina or shortness of breath or excessive fatigue  Objective:  BP (!) 158/89   Pulse 77   Temp 98.2 F (36.8 C) (Oral)   Resp 18   Ht _0  (1.778 m)   Wt 160 lb (72.6 kg)   SpO2 99%   BMI 22.96 kg/m   Physical Exam patient is alert cooperative compliant Pupils are equally round reactive to light extraocular muscles intact Heart is regular rate and rhythm without murmur Lungs are clear to Auscultation Her strength appears to be a baselineAnd no changes are noted    Assessment & Plan:   Krista Kennedy was seen today for joint pain and hand pain.  Diagnoses and all orders for this visit:  Chronic pain syndrome -     Discontinue: oxyCODONE 30 MG 12 hr tablet; Take 1 tablet by mouth every 12 (twelve) hours. -     oxyCODONE 30 MG 12 hr tablet; Take 1 tablet by mouth every 12 (twelve) hours.  Pain in multiple finger joints  Psoriatic arthritis (H[facility])  Hip pain, bilateral  Psoriasis with arthropathy ([place]) -     Discontinue: oxyCODONE-acetaminophen (PERCOCET) 10-325 MG tablet; Take 1 tablet by mouth every 6 (six) hours as needed for pain. -     oxyCODONE-acetaminophen (PERCOCET) 10-325 MG tablet; Take 1 tablet by mouth every 6 (six) hours as needed for pain. -     Discontinue: oxyCODONE 30 MG 12 hr tablet; Take 1 tablet by mouth every 12 (twelve) hours. -     oxyCODONE 30 MG 12 hr tablet; Take 1 tablet by mouth every 12 (twelve) hours.     ----------------------------------------------------------------------------------------------------------------------  Problem List Items Addressed This Visit      Musculoskeletal and Integument   Psoriasis with arthropathy (HCC)   Relevant Medications   oxyCODONE-acetaminophen (PERCOCET) 10-325 MG tablet   oxyCODONE 30 MG 12 hr tablet   Psoriatic arthritis (HCC)     Other   Chronic pain syndrome - Primary   Relevant Medications   oxyCODONE 30 MG 12 hr tablet   Hip pain, bilateral    Pain in multiple finger joints      ----------------------------------------------------------------------------------------------------------------------  1. Psoriatic arthritis ([place]) I will refill her medications with return to clinic in 2 months. Though she is having no problems with her medication I'm going to give her a prophylactic emergency Narcan prescription as reviewed with her today in its use.  2. Chronic pain syndrome . She has not had a previous lumbar MRI however her low back pain has been stable in nature with no reports of lower extremity weakness or bowel bladder dysfunction and think that it is reasonable to defer on this examination at this time.  3. Hip pain, bilateral As above I do want her to continue follow-up with her orthopedics  4. Pain in multiple finger joints Continue with rheumatology    ----------------------------------------------------------------------------------------------------------------------  I am having Ms. Headings maintain her furosemide, Ustekinumab, vitamin C, Cholecalciferol, clindamycin, meloxicam, fluticasone, olopatadine, metroNIDAZOLE, betamethasone dipropionate, fluocinonide, Ixekizumab, carvedilol, venlafaxine XR, digoxin, levothyroxine, lisinopril, naloxone, oxyCODONE-acetaminophen, and oxyCODONE.   Meds ordered this encounter  Medications  . DISCONTD: oxyCODONE-acetaminophen (PERCOCET) 10-325 MG tablet    Sig: Take 1 tablet by mouth every 6 (six) hours as needed for pain.    Dispense:  120 tablet    Refill:  0    Do not fill until [id number]  . oxyCODONE-acetaminophen (PERCOCET) 10-325 MG tablet    Sig: Take 1 tablet  by mouth every 6 (six) hours as needed for pain.    Dispense:  120 tablet    Refill:  0    Do not fill until [id number]  . DISCONTD: oxyCODONE 30 MG 12 hr tablet    Sig: Take 1 tablet by mouth every 12 (twelve) hours.    Dispense:  60 each    Refill:  0    Do not fill until [id number]  . oxyCODONE 30 MG 12 hr  tablet    Sig: Take 1 tablet by mouth every 12 (twelve) hours.    Dispense:  60 each    Refill:  0    Do not fill until [id number]       Follow-up: Return in about 2 months (around 11/18/2016) for evaluation, med refill.    Molli Barrows, MD  This dictation was performed utilizing Dragon voice recognition software.  Please excuse any unintentional or mistaken typographical errors as a result of its unedited utilization.

## 2016-12-06 ENCOUNTER — Ambulatory Visit: Payer: Commercial Managed Care - PPO | Admitting: Anesthesiology

## 2016-12-08 ENCOUNTER — Ambulatory Visit: Payer: Commercial Managed Care - PPO | Attending: Anesthesiology | Admitting: Anesthesiology

## 2016-12-08 ENCOUNTER — Encounter: Payer: Self-pay | Admitting: Anesthesiology

## 2016-12-08 VITALS — BP 167/98 | HR 74 | Temp 97.8°F | Resp 16 | Ht 70.0 in | Wt 160.0 lb

## 2016-12-08 DIAGNOSIS — M2550 Pain in unspecified joint: Secondary | ICD-10-CM | POA: Diagnosis present

## 2016-12-08 DIAGNOSIS — G894 Chronic pain syndrome: Secondary | ICD-10-CM | POA: Diagnosis not present

## 2016-12-08 DIAGNOSIS — M25571 Pain in right ankle and joints of right foot: Secondary | ICD-10-CM | POA: Insufficient documentation

## 2016-12-08 DIAGNOSIS — I429 Cardiomyopathy, unspecified: Secondary | ICD-10-CM | POA: Insufficient documentation

## 2016-12-08 DIAGNOSIS — M25561 Pain in right knee: Secondary | ICD-10-CM | POA: Diagnosis not present

## 2016-12-08 DIAGNOSIS — M25552 Pain in left hip: Secondary | ICD-10-CM | POA: Insufficient documentation

## 2016-12-08 DIAGNOSIS — I10 Essential (primary) hypertension: Secondary | ICD-10-CM | POA: Diagnosis not present

## 2016-12-08 DIAGNOSIS — L4050 Arthropathic psoriasis, unspecified: Secondary | ICD-10-CM | POA: Diagnosis not present

## 2016-12-08 DIAGNOSIS — M25549 Pain in joints of unspecified hand: Secondary | ICD-10-CM

## 2016-12-08 DIAGNOSIS — M25551 Pain in right hip: Secondary | ICD-10-CM

## 2016-12-08 DIAGNOSIS — M25572 Pain in left ankle and joints of left foot: Secondary | ICD-10-CM | POA: Diagnosis not present

## 2016-12-08 DIAGNOSIS — E079 Disorder of thyroid, unspecified: Secondary | ICD-10-CM | POA: Insufficient documentation

## 2016-12-08 DIAGNOSIS — Z87891 Personal history of nicotine dependence: Secondary | ICD-10-CM | POA: Diagnosis not present

## 2016-12-08 DIAGNOSIS — M79641 Pain in right hand: Secondary | ICD-10-CM | POA: Diagnosis not present

## 2016-12-08 DIAGNOSIS — E785 Hyperlipidemia, unspecified: Secondary | ICD-10-CM | POA: Diagnosis not present

## 2016-12-08 DIAGNOSIS — M79642 Pain in left hand: Secondary | ICD-10-CM | POA: Diagnosis not present

## 2016-12-08 DIAGNOSIS — M25562 Pain in left knee: Secondary | ICD-10-CM | POA: Insufficient documentation

## 2016-12-08 MED ORDER — OXYCODONE HCL ER 30 MG PO T12A: 1.0000 | EXTENDED_RELEASE_TABLET | Freq: Two times a day (BID) | ORAL | 0 refills | Status: DC

## 2016-12-08 MED ORDER — OXYCODONE-ACETAMINOPHEN 10-325 MG PO TABS: 1.0000 | ORAL_TABLET | Freq: Four times a day (QID) | ORAL | 0 refills | Status: DC | PRN

## 2016-12-08 MED ORDER — OXYCODONE-ACETAMINOPHEN 10-325 MG PO TABS: 1.0000 | ORAL_TABLET | Freq: Three times a day (TID) | ORAL | 0 refills | Status: DC | PRN

## 2016-12-08 NOTE — Progress Notes (Signed)
Nursing Pain Medication Assessment:  Safety precautions to be maintained throughout the outpatient stay will include: orient to surroundings, keep bed in low position, maintain call bell within reach at all times, provide assistance with transfer out of bed and ambulation.  Medication Inspection Compliance: Krista Kennedy did not comply with our request to bring her pills to be counted. She was reminded that bringing the medication bottles, even when empty, is a requirement. Pill/Patch Count: None available to be counted. Bottle Appearance: No container available. Did not bring bottle(s) to appointment. Medication: None brought in. Filled Date: N/A Last Medication intake:  12/08/16 Patient instructed to bring pill bottles for pill count to future appointments.

## 2016-12-08 NOTE — Patient Instructions (Signed)

## 2016-12-08 NOTE — Progress Notes (Signed)
Subjective:  Patient ID: Krista Kennedy, female    DOB: 1965-12-01  Age: 51 y.o. MRN: [id number]  CC: Joint Pain Diffuse body pain affecting the hips low back with feet  Service Provided on Last Visit: Med Refill  PROCEDURE:None  HPI Krista Kennedy presents for reevaluation last seen in January. She continues to have hand pain bilateral hip pain with radiation of pain that she experiences in the knees and ankles. She is followed by rheumatology at [facility]. Her condition remains stable in nature however she fails conservative therapy with lesser medications and has required long-acting opioids with opioid for breakthrough pain. She continues to derive good functional lifestyle improvement based on her narcotic assessment sheet from these medications. She denies significant side effects with the medications as well. Otherwise she is in her usual state of health today. No new contributory changes are mentioned.  At baseline her history describes a gnawing aching pain in the low back that radiates into both hips. It's present throughout the mainstay of the day and even despite the medications the pain is severe and unremitting. She describes a maximum VAS score that can be between an 8 and 10 and an excruciating nature to the pain. She states that she is never pain-free. She really has no associated low back pain but does have pain that radiates from the hip on both sides into the posterior lateral legs. No associated weakness is reported today. She furthermore reports no side effects with the regimen of OxyContin and Percocet with no reported sedation or somnolence. This has all been stable in nature. This was initially managed by her primary care physician. She was later evaluated by a rheumatologist and is due for follow-up.  Since her last visit she reports no significant changes. The quality characteristic and distribution of pain have been otherwise stable.   History Krista Kennedy has a past  medical history of Cardiomyopathy ([place]); Hyperlipidemia; Hypertension; Psoriasis; and Thyroid disease.   She has a past surgical history that includes Thyroidectomy; Tonsillectomy; Tubal ligation; and Endometrial ablation (2015).   Her family history includes Heart disease in her father; Hyperlipidemia in her father and mother; Hypertension in her father and mother.She reports that she has quit smoking. Her smoking use included Cigarettes. She has a 3.75 pack-year smoking history. She has never used smokeless tobacco. She reports that she does not drink alcohol or use drugs.  No results found for this or any previous visit.  ToxAssure Select 13  Date Value Ref Range Status  06/27/2016 FINAL  Final    Comment:    ==================================================================== TOXASSURE SELECT 13 (MW) ==================================================================== Test                             Result       Flag       Units Drug Present and Declared for Prescription Verification   Oxycodone                      >3704        EXPECTED   ng/mg creat   Oxymorphone                    3554         EXPECTED   ng/mg creat   Noroxycodone                   >3704  EXPECTED   ng/mg creat   Noroxymorphone                 872          EXPECTED   ng/mg creat    Sources of oxycodone are scheduled prescription medications.    Oxymorphone, noroxycodone, and noroxymorphone are expected    metabolites of oxycodone. Oxymorphone is also available as a    scheduled prescription medication. ==================================================================== Test                      Result    Flag   Units      Ref Range   Creatinine              270              mg/dL      >=20 ==================================================================== Declared Medications:  The flagging and interpretation on this report are based on the  following declared medications.  Unexpected results Krista Kennedy arise  from  inaccuracies in the declared medications.  **Note: The testing scope of this panel includes these medications:  Oxycodone  Oxycodone (Percocet)  **Note: The testing scope of this panel does not include following  reported medications:  Acetaminophen (Percocet)  Betamethasone (Diprolene)  Carvedilol (Coreg)  Cholecalciferol  Clindamycin (Cleocin)  Digoxin  Fluticasone (Flonase)  Furosemide (Lasix)  Levothyroxine  Lisinopril  Meloxicam (Mobic)  Metronidazole (Flagyl)  Olopatadine (patanol)  Topical  Topical (Lidex)  Venlafaxine  Vitamin C ==================================================================== For clinical consultation, please call [phone]. ====================================================================     Outpatient Medications Prior to Visit  Medication Sig Dispense Refill  . Ascorbic Acid (VITAMIN C) 1000 MG tablet Take 1,000 mg by mouth daily.    . betamethasone dipropionate (DIPROLENE) 0.05 % ointment   0  . carvedilol (COREG) 25 MG tablet Take 1 tablet (25 mg total) by mouth 2 (two) times daily with a meal. 180 tablet 3  . Cholecalciferol (VITAMIN D-1000 MAX ST) 1000 UNITS tablet Take by mouth.    . digoxin (LANOXIN) 0.25 MG tablet Take 1 tablet (0.25 mg total) by mouth daily. 90 tablet 1  . fluocinonide (LIDEX) 0.05 % external solution   0  . Ixekizumab (TALTZ) 80 MG/ML SOSY Inject 80 mg into the skin as directed.    Marland Kitchen levothyroxine (SYNTHROID, LEVOTHROID) 125 MCG tablet Take 1 tablet (125 mcg total) by mouth daily. 90 tablet 3  . lisinopril (PRINIVIL,ZESTRIL) 5 MG tablet Take 1 tablet (5 mg total) by mouth daily. 90 tablet 0  . meloxicam (MOBIC) 7.5 MG tablet Take by mouth daily.     . naloxone (NARCAN) nasal spray 4 mg/0.1 mL To reverse excess sedation from narcotics 2 kit 1  . venlafaxine XR (EFFEXOR-XR) 75 MG 24 hr capsule Take 1 capsule (75 mg total) by mouth daily with breakfast. 90 capsule 3  . oxyCODONE 30 MG 12 hr tablet Take 1  tablet by mouth every 12 (twelve) hours. 60 each 0  . oxyCODONE-acetaminophen (PERCOCET) 10-325 MG tablet Take 1 tablet by mouth every 6 (six) hours as needed for pain. 120 tablet 0  . clindamycin (CLEOCIN) 150 MG capsule Take 1 capsule (150 mg total) by mouth daily. (Patient not taking: Reported on 12/08/2016) 30 capsule 11  . fluticasone (FLONASE) 50 MCG/ACT nasal spray Place 2 sprays into both nostrils daily. (Patient not taking: Reported on 12/08/2016) 16 g 3  . furosemide (LASIX) 40 MG tablet Take 40 mg by mouth. Reported on 01/13/2016    .  metroNIDAZOLE (FLAGYL) 500 MG tablet Take 1 tablet (500 mg total) by mouth 2 (two) times daily. (Patient not taking: Reported on 09/20/2016) 21 tablet 0  . olopatadine (PATANOL) 0.1 % ophthalmic solution Place 1 drop into both eyes 2 (two) times daily. (Patient not taking: Reported on 12/08/2016) 15 mL 3  . Ustekinumab (STELARA) 45 MG/0.5ML SOLN Inject into the skin every 3 (three) months. Reported on 01/13/2016     No facility-administered medications prior to visit.    Lab Results  Component Value Date   WBC 6.4 04/30/2013   HGB 12.2 04/30/2013   HCT 36.3 04/30/2013   PLT 222 09/09/2012   GLUCOSE 199 (H) 03/10/2016   CHOL 194 03/10/2016   TRIG 176 (H) 03/10/2016   HDL 46 03/10/2016   LDLCALC 113 03/10/2016   ALT 11 03/10/2016   AST 17 03/10/2016   NA 136 03/10/2016   K 3.5 03/10/2016   CL 104 03/10/2016   CREATININE 0.77 03/10/2016   BUN 10 03/10/2016   CO2 24 03/10/2016   TSH 0.24 (L) 03/10/2016    --------------------------------------------------------------------------------------------------------------------- Dg Chest 2 View  Result Date: 1/5/201 PRIOR REPORT IMPORTED FROM AN EXTERNAL SYSTEM  PRIOR REPORT IMPORTED FROM THE SYNGO WORKFLOW SYSTEM REASON FOR EXAM:    SOB COMMENTS: PROCEDURE:     DXR - DXR CHEST PA (OR AP) AND LATERAL  - Sep 09 2012  1:57PM RESULT:     The lungs are clear. The cardiac silhouette and visualized bony skeleton are  unremarkable. IMPRESSION: 1. Chest radiograph without evidence of acute cardiopulmonary disease.        ---------------------------------------------------------------------------------------------------------------------- Past Medical History:  Diagnosis Date  . Cardiomyopathy ([place])   . Hyperlipidemia   . Hypertension   . Psoriasis   . Thyroid disease     Past Surgical History:  Procedure Laterality Date  . ENDOMETRIAL ABLATION  2015  . THYROIDECTOMY    . TONSILLECTOMY    . TUBAL LIGATION      Family History  Problem Relation Age of Onset  . Hypertension Mother   . Hyperlipidemia Mother   . Heart disease Father     CABG   . Hyperlipidemia Father   . Hypertension Father     Social History  Substance Use Topics  . Smoking status: Former Smoker    Packs/day: 0.25    Years: 15.00    Types: Cigarettes  . Smokeless tobacco: Never Used  . Alcohol use No    ---------------------------------------------------------------------------------------------------------------------- Social History   Social History  . Marital status: Married    Spouse name: N/A  . Number of children: N/A  . Years of education: N/A   Social History Main Topics  . Smoking status: Former Smoker    Packs/day: 0.25    Years: 15.00    Types: Cigarettes  . Smokeless tobacco: Never Used  . Alcohol use No  . Drug use: No  . Sexual activity: Yes   Other Topics Concern  . None   Social History Narrative  . None    Scheduled Meds: Continuous Infusions: PRN Meds:.   BP (!) 167/98   Pulse 74   Temp 97.8 F (36.6 C) (Oral)   Resp 16   Ht '5\' 10"'$  (1.778 m)   Wt 160 lb (72.6 kg)   SpO2 100%   BMI 22.96 kg/m    BP Readings from Last 3 Encounters:  12/08/16 (!) 167/98  09/20/16 (!) 158/89  08/26/16 (!) 146/68     Wt Readings from Last  3 Encounters:  12/08/16 160 lb (72.6 kg)  09/20/16 160 lb (72.6 kg)  08/26/16 160 lb (72.6 kg)      ----------------------------------------------------------------------------------------------------------------------  ROS Review of Systems as per chart GI: No constipation Cardiac: No angina or shortness of breath or excessive fatigue  Objective:  BP (!) 167/98   Pulse 74   Temp 97.8 F (36.6 C) (Oral)   Resp 16   Ht 5\' 10"  (1.778 m)   Wt 160 lb (72.6 kg)   SpO2 100%   BMI 22.96 kg/m   Physical Exam patient is alert cooperative compliant Pupils are equally round reactive to light extraocular muscles intact Heart is regular rate and rhythm without murmur Lungs are clear to Auscultation Her strength appears to be a baselineAnd no changes are notedAnd her exam is stable    Assessment & Plan:   Krista Kennedy was seen today for joint pain.  Diagnoses and all orders for this visit:  Chronic pain syndrome -     Discontinue: oxyCODONE 30 MG 12 hr tablet; Take 1 tablet by mouth every 12 (twelve) hours. -     oxyCODONE 30 MG 12 hr tablet; Take 1 tablet by mouth every 12 (twelve) hours. -     ToxASSURE Select 13 (MW), Urine  Pain in multiple finger joints  Psoriatic arthritis (HCC)  Hip pain, bilateral  Psoriasis with arthropathy (HCC) -     Discontinue: oxyCODONE 30 MG 12 hr tablet; Take 1 tablet by mouth every 12 (twelve) hours. -     oxyCODONE 30 MG 12 hr tablet; Take 1 tablet by mouth every 12 (twelve) hours. -     ToxASSURE Select 13 (MW), Urine -     Discontinue: oxyCODONE-acetaminophen (PERCOCET) 10-325 MG tablet; Take 1 tablet by mouth every 6 (six) hours as needed for pain. -     oxyCODONE-acetaminophen (PERCOCET) 10-325 MG tablet; Take 1 tablet by mouth every 8 (eight) hours as needed for pain.     ----------------------------------------------------------------------------------------------------------------------  Problem List Items Addressed This Visit      Musculoskeletal and Integument   Psoriasis with arthropathy (HCC)   Relevant Medications    oxyCODONE 30 MG 12 hr tablet   oxyCODONE-acetaminophen (PERCOCET) 10-325 MG tablet   Other Relevant Orders   ToxASSURE Select 13 (MW), Urine   Psoriatic arthritis (HCC)     Other   Chronic pain syndrome - Primary   Relevant Medications   oxyCODONE 30 MG 12 hr tablet   Other Relevant Orders   ToxASSURE Select 13 (MW), Urine   Hip pain, bilateral   Pain in multiple finger joints      ----------------------------------------------------------------------------------------------------------------------  1. Psoriatic arthritis (HCC)  2. Chronic pain syndrome We will refill her medications today. I'm going to continue her oxycodone extended release 30 mg tablets 4 twice a day for the next 2 months. I talked to her about an effort to wean some of the breakthrough medication she is utilizing. This would enable Krista Kennedy to lower her total daily dose in an effort to reduce dose her overall risk profile with these medications. We have had a long discussion regarding this today. We will refill the oxycodone 10 mg tablets that she's been taking historically 5-6 times a day. Presently she is on this 4 times a day and we will do this for the next month and then she is to reduce this to 3 tablets per day for the following month. She can break one of the tablets in half to enable a 4  times a day dosing at 10, 10, 5, 5. We'll continue in this effort over the course of the next several months to reduce this to 5 mg tablets 4 times a day for breakthrough. 3. Hip pain, bilateral As above I do want her to continue follow-up with her orthopedics  4. Pain in multiple finger joints Continue with rheumatology    ----------------------------------------------------------------------------------------------------------------------  I have discontinued Ms. Huestis's oxyCODONE-acetaminophen. I have also changed her oxyCODONE-acetaminophen. Additionally, I am having her maintain her furosemide, Ustekinumab, vitamin C,  Cholecalciferol, clindamycin, meloxicam, fluticasone, olopatadine, metroNIDAZOLE, betamethasone dipropionate, fluocinonide, Ixekizumab, carvedilol, venlafaxine XR, digoxin, levothyroxine, lisinopril, naloxone, and oxyCODONE.   Meds ordered this encounter  Medications  . DISCONTD: oxyCODONE 30 MG 12 hr tablet    Sig: Take 1 tablet by mouth every 12 (twelve) hours.    Dispense:  60 each    Refill:  0    Do not fill until [id number]  . oxyCODONE 30 MG 12 hr tablet    Sig: Take 1 tablet by mouth every 12 (twelve) hours.    Dispense:  60 each    Refill:  0    Do not fill until [id number]  . DISCONTD: oxyCODONE-acetaminophen (PERCOCET) 10-325 MG tablet    Sig: Take 1 tablet by mouth every 6 (six) hours as needed for pain.    Dispense:  120 tablet    Refill:  0  . oxyCODONE-acetaminophen (PERCOCET) 10-325 MG tablet    Sig: Take 1 tablet by mouth every 8 (eight) hours as needed for pain.    Dispense:  90 tablet    Refill:  0    Do not fill until [id number]       Follow-up: Return in about 2 months (around 02/07/2017) for evaluation, med refill.    Molli Barrows, MD  This dictation was performed utilizing Dragon voice recognition software.  Please excuse any unintentional or mistaken typographical errors as a result of its unedited utilization.

## 2016-12-19 LAB — TOXASSURE SELECT 13 (MW), URINE

## 2017-02-02 ENCOUNTER — Ambulatory Visit: Payer: Commercial Managed Care - PPO | Attending: Anesthesiology | Admitting: Nurse Practitioner

## 2017-02-02 ENCOUNTER — Encounter: Payer: Self-pay | Admitting: Nurse Practitioner

## 2017-02-02 VITALS — BP 148/72 | HR 88 | Temp 98.0°F | Resp 16 | Ht 70.0 in | Wt 160.0 lb

## 2017-02-02 DIAGNOSIS — Z79891 Long term (current) use of opiate analgesic: Secondary | ICD-10-CM | POA: Diagnosis not present

## 2017-02-02 DIAGNOSIS — Z87891 Personal history of nicotine dependence: Secondary | ICD-10-CM | POA: Insufficient documentation

## 2017-02-02 DIAGNOSIS — T402X5A Adverse effect of other opioids, initial encounter: Secondary | ICD-10-CM | POA: Diagnosis not present

## 2017-02-02 DIAGNOSIS — I110 Hypertensive heart disease with heart failure: Secondary | ICD-10-CM | POA: Insufficient documentation

## 2017-02-02 DIAGNOSIS — M545 Low back pain: Secondary | ICD-10-CM | POA: Insufficient documentation

## 2017-02-02 DIAGNOSIS — E890 Postprocedural hypothyroidism: Secondary | ICD-10-CM | POA: Diagnosis not present

## 2017-02-02 DIAGNOSIS — G894 Chronic pain syndrome: Secondary | ICD-10-CM

## 2017-02-02 DIAGNOSIS — Z79899 Other long term (current) drug therapy: Secondary | ICD-10-CM | POA: Diagnosis not present

## 2017-02-02 DIAGNOSIS — Z9889 Other specified postprocedural states: Secondary | ICD-10-CM | POA: Diagnosis not present

## 2017-02-02 DIAGNOSIS — Z882 Allergy status to sulfonamides status: Secondary | ICD-10-CM | POA: Insufficient documentation

## 2017-02-02 DIAGNOSIS — K219 Gastro-esophageal reflux disease without esophagitis: Secondary | ICD-10-CM | POA: Diagnosis not present

## 2017-02-02 DIAGNOSIS — M25551 Pain in right hip: Secondary | ICD-10-CM

## 2017-02-02 DIAGNOSIS — I5022 Chronic systolic (congestive) heart failure: Secondary | ICD-10-CM | POA: Insufficient documentation

## 2017-02-02 DIAGNOSIS — M25552 Pain in left hip: Secondary | ICD-10-CM

## 2017-02-02 DIAGNOSIS — E559 Vitamin D deficiency, unspecified: Secondary | ICD-10-CM | POA: Insufficient documentation

## 2017-02-02 DIAGNOSIS — I429 Cardiomyopathy, unspecified: Secondary | ICD-10-CM | POA: Insufficient documentation

## 2017-02-02 DIAGNOSIS — E785 Hyperlipidemia, unspecified: Secondary | ICD-10-CM | POA: Insufficient documentation

## 2017-02-02 DIAGNOSIS — Z8249 Family history of ischemic heart disease and other diseases of the circulatory system: Secondary | ICD-10-CM | POA: Insufficient documentation

## 2017-02-02 DIAGNOSIS — Z7902 Long term (current) use of antithrombotics/antiplatelets: Secondary | ICD-10-CM | POA: Insufficient documentation

## 2017-02-02 DIAGNOSIS — K5903 Drug induced constipation: Secondary | ICD-10-CM | POA: Insufficient documentation

## 2017-02-02 DIAGNOSIS — L4050 Arthropathic psoriasis, unspecified: Secondary | ICD-10-CM

## 2017-02-02 DIAGNOSIS — Z9851 Tubal ligation status: Secondary | ICD-10-CM | POA: Diagnosis not present

## 2017-02-02 DIAGNOSIS — G8929 Other chronic pain: Secondary | ICD-10-CM

## 2017-02-02 MED ORDER — NALOXEGOL OXALATE 25 MG PO TABS: 25.0000 mg | ORAL_TABLET | Freq: Every day | ORAL | 1 refills | Status: AC

## 2017-02-02 MED ORDER — OXYCODONE HCL ER 30 MG PO T12A: 1.0000 | EXTENDED_RELEASE_TABLET | Freq: Two times a day (BID) | ORAL | 0 refills | Status: AC

## 2017-02-02 MED ORDER — OXYCODONE-ACETAMINOPHEN 10-325 MG PO TABS: 1.0000 | ORAL_TABLET | Freq: Two times a day (BID) | ORAL | 0 refills | Status: DC

## 2017-02-02 MED ORDER — OXYCODONE HCL ER 30 MG PO T12A: 30.0000 mg | EXTENDED_RELEASE_TABLET | Freq: Two times a day (BID) | ORAL | 0 refills | Status: DC

## 2017-02-02 MED ORDER — OXYCODONE-ACETAMINOPHEN 10-325 MG PO TABS: 1.0000 | ORAL_TABLET | Freq: Three times a day (TID) | ORAL | 0 refills | Status: AC | PRN

## 2017-02-02 NOTE — Progress Notes (Signed)
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 times daily.    Dispense:  60 tablet    Refill:  0    Do not place this medication, or any other prescription from our practice, on "Automatic Refill". Patient may have prescription filled one day early if pharmacy is closed on scheduled refill date. Do not fill until: 03/10/17 To last until:04/09/17    Order Specific Question:   Supervising Provider    Answer:   Molli Barrows [[id number]]   oxyCODONE-acetaminophen (PERCOCET) 10-325 MG tablet    Sig: Take 1 tablet by mouth every 8 (eight) hours as needed for pain.    Dispense:  90 tablet    Refill:  0    Do not fill until 02/08/2017    Order Specific Question:   Supervising Provider    Answer:   Molli Barrows [[id number]]   naloxegol oxalate (MOVANTIK) 25 MG TABS tablet    Sig: Take 1 tablet (25 mg total) by mouth daily. Take on an empty stomach at least 1 hour before or 2 hours after a meal.    Dispense:  30 tablet    Refill:  1    Please  instruct the patient not to break the tablet.    Order Specific Question:   Supervising Provider    Answer:   Molli Barrows [[id number]]   New Prescriptions   NALOXEGOL OXALATE (MOVANTIK) 25 MG TABS TABLET    Take 1 tablet (25 mg total) by mouth daily. Take on an empty stomach at least 1 hour before or 2 hours after a meal.   OXYCODONE 30 MG 12 HR TABLET    Take 30 mg by mouth every 12 (twelve) hours.   OXYCODONE-ACETAMINOPHEN (PERCOCET) 10-325 MG TABLET    Take 1 tablet by mouth 2 (two) times daily.   Medications administered today: Ms. Harwick had no medications administered during this visit. Lab-work, procedure(s), and/or referral(s): No orders of the defined types were placed in this encounter.  Imaging and/or referral(s): None  Interventional therapies: Planned, scheduled, and/or pending:   Encouraged the use of sunshine and aqua therapy to regular stretching and exercise    Considering:   Not at this time.    Palliative PRN treatment(s):   Not at this time.    Provider-requested follow-up: Return in about 2 months (around 04/04/2017) for Medication Mgmt.  Future Appointments Date Time Provider [place]  04/11/2017 9:15 AM Vevelyn Francois, NP [facility] None   Primary Care Physician: No primary care provider on file. Location: [facility] Note by: Vevelyn Francois NP Date: 02/02/2017; Time: 4:01 PM  Pain Score Disclaimer: We use the NRS-11 scale. This is a self-reported, subjective measurement of pain severity with only modest accuracy. It is used primarily to identify changes within a particular patient. It must be understood that outpatient pain scales are significantly less accurate that those used for research, where they can be applied under ideal controlled circumstances with minimal exposure to variables. In reality, the score is likely to be a combination of pain intensity and pain affect, where pain affect describes the degree of  emotional arousal or changes in action  readiness caused by the sensory experience of pain. Factors such as social and work situation, setting, emotional state, anxiety levels, expectation, and prior pain experience may influence pain perception and show large inter-individual differences that may also be affected by time variables.  Patient instructions provided during this appointment: Patient Instructions    ____________________________________________________________________________________________  Medication Rules  Applies to: All patients receiving prescriptions (written or electronic).  Pharmacy of record: Pharmacy where electronic prescriptions will be sent. If written prescriptions are taken to a different pharmacy, please inform the nursing staff. The pharmacy listed in the electronic medical record should be the one where you would like electronic prescriptions to be sent.  Prescription refills: Only during scheduled appointments. Applies to both, written and electronic prescriptions.  NOTE: The following applies primarily to controlled substances (Opioid Pain Medications)  Patient's responsibilities: 1. Pain Pills: Bring all pain pills to every appointment (except for procedure appointments). 2. Pill Bottles: Bring pills in original pharmacy bottle. Always bring newest bottle. Bring bottle, even if empty. 3. Medication refills: You are responsible for knowing and keeping track of what medications you need refilled. The day before your appointment, write a list of all prescriptions that need to be refilled. Bring that list to your appointment and give it to the admitting nurse. Prescriptions will be written only during appointments. If you forget a medication, it will not be "Called in", "Faxed", or "electronically sent". You will need to get another appointment to get these prescribed. 4. Prescription Accuracy: You are responsible for carefully inspecting your prescriptions before  leaving our office. Have the discharge nurse carefully go over each prescription with you, before taking them home. Make sure that your name is accurately spelled, that your address is correct. Check the name and dose of your medication to make sure it is accurate. Check the number of pills, and the written instructions to make sure they are clear and accurate. Make sure that you are given enough medication to last until your next medication refill appointment. 5. Taking Medication: Take medication as prescribed. Never take more pills than instructed. Never take medication more frequently than prescribed. Taking less pills or less frequently is permitted and encouraged, when it comes to controlled substances (written prescriptions).  6. Inform other Doctors: Always inform, all of your healthcare providers, of all the medications you take. 7. Pain Medication from other Providers: You are not allowed to accept any additional pain medication from any other Doctor or Healthcare provider. There are two exceptions to this rule. (see below) In the event that you require additional pain medication, you are responsible for notifying us, as stated below. 8. Medication Agreement: You are responsible for carefully reading and following our Medication Agreement. This must be signed before receiving any prescriptions from our practice. Safely store a copy of your signed Agreement. Violations to the Agreement will result in no further prescriptions. (Additional copies of our Medication Agreement are available upon request.) 9. Laws, Rules, & Regulations: All patients are expected to follow all Federal and [employer], Rules, [employer]. Ignorance of the Laws does not constitute a valid excuse.  Exceptions: There are only two exceptions to the rule of not receiving pain medications from other Healthcare Providers. 1. Exception #1 (Emergencies): In the event of an emergency (i.e.: accident requiring emergency  care), you are allowed to receive additional pain medication. However, you are responsible for: As soon as you are able, call our office [phone], at any time of the day or  night, and leave a message stating your name, the date and nature of the emergency, and the name and dose of the medication prescribed. In the event that your call is answered by a member of our staff, make sure to document and save the date, time, and the name of the person that took your information.  2. Exception #2 (Planned Surgery): In the event that you are scheduled by another doctor or dentist to have any type of surgery or procedure, you are allowed (for a period no longer than 30 days), to receive additional pain medication, for the acute post-op pain. However, in this case, you are responsible for picking up a copy of our "Post-op Pain Management for Surgeons" handout, and giving it to your surgeon or dentist. This document is available at our office, and does not require an appointment to obtain it. Simply go to our office during business hours (Monday-Thursday from 8:00 AM to 4:00 PM) (Friday 8:00 AM to 12:00 Noon) or if you have a scheduled appointment with [place], prior to your surgery, and ask for it by name. In addition, you will need to provide [place] with your name, name of your surgeon, type of surgery, and date of procedure or surgery.  ____________________________________________________________________________________________ ____________________________________________________________________________________________  Pain Scale  Introduction: The pain score used by this practice is the Verbal Numerical Rating Scale (VNRS-11). This is an 11-point scale. It is for adults and children 10 years or older. There are significant differences in how the pain score is reported, used, and applied. Forget everything you learned in the past and learn this scoring system.  General Information: The scale should reflect your  current level of pain. Unless you are specifically asked for the level of your worst pain, or your average pain. If you are asked for one of these two, then it should be understood that it is over the past 24 hours.  Basic Activities of Daily Living (ADL): Personal hygiene, dressing, eating, transferring, and using restroom.  Instructions: Most patients tend to report their level of pain as a combination of two factors, their physical pain and their psychosocial pain. This last one is also known as suffering and it is reflection of how physical pain affects you socially and psychologically. From now on, report them separately. From this point on, when asked to report your pain level, report only your physical pain. Use the following table for reference.  Pain Clinic Pain Levels (0-5/10)  Pain Level Score  Description  No Pain 0   Mild pain 1 Nagging, annoying, but does not interfere with basic activities of daily living (ADL). Patients are able to eat, bathe, get dressed, toileting (being able to get on and off the toilet and perform personal hygiene functions), transfer (move in and out of bed or a chair without assistance), and maintain continence (able to control bladder and bowel functions). Blood pressure and heart rate are unaffected. A normal heart rate for a healthy adult ranges from 60 to 100 bpm (beats per minute).   Mild to moderate pain 2 Noticeable and distracting. Impossible to hide from other people. More frequent flare-ups. Still possible to adapt and function close to normal. It can be very annoying and may have occasional stronger flare-ups. With discipline, patients may get used to it and adapt.   Moderate pain 3 Interferes significantly with activities of daily living (ADL). It becomes difficult to feed, bathe, get dressed, get on and off the toilet or to perform personal hygiene functions. Difficult  to get in and out of bed or a chair without assistance. Very distracting. With  effort, it can be ignored when deeply involved in activities.   Moderately severe pain 4 Impossible to ignore for more than a few minutes. With effort, patients may still be able to manage work or participate in some social activities. Very difficult to concentrate. Signs of autonomic nervous system discharge are evident: dilated pupils (mydriasis); mild sweating (diaphoresis); sleep interference. Heart rate becomes elevated (>115 bpm). Diastolic blood pressure (lower number) rises above 100 mmHg. Patients find relief in laying down and not moving.   Severe pain 5 Intense and extremely unpleasant. Associated with frowning face and frequent crying. Pain overwhelms the senses.  Ability to do any activity or maintain social relationships becomes significantly limited. Conversation becomes difficult. Pacing back and forth is common, as getting into a comfortable position is nearly impossible. Pain wakes you up from deep sleep. Physical signs will be obvious: pupillary dilation; increased sweating; goosebumps; brisk reflexes; cold, clammy hands and feet; nausea, vomiting or dry heaves; loss of appetite; significant sleep disturbance with inability to fall asleep or to remain asleep. When persistent, significant weight loss is observed due to the complete loss of appetite and sleep deprivation.  Blood pressure and heart rate becomes significantly elevated. Caution: If elevated blood pressure triggers a pounding headache associated with blurred vision, then the patient should immediately seek attention at an urgent or emergency care unit, as these may be signs of an impending stroke.    Emergency Department Pain Levels (6-10/10)  Emergency Room Pain 6 Severely limiting. Requires emergency care and should not be seen or managed at an outpatient pain management facility. Communication becomes difficult and requires great effort. Assistance to reach the emergency department may be required. Facial flushing and  profuse sweating along with potentially dangerous increases in heart rate and blood pressure will be evident.   Distressing pain 7 Self-care is very difficult. Assistance is required to transport, or use restroom. Assistance to reach the emergency department will be required. Tasks requiring coordination, such as bathing and getting dressed become very difficult.   Disabling pain 8 Self-care is no longer possible. At this level, pain is disabling. The individual is unable to do even the most basic activities such as walking, eating, bathing, dressing, transferring to a bed, or toileting. Fine motor skills are lost. It is difficult to think clearly.   Incapacitating pain 9 Pain becomes incapacitating. Thought processing is no longer possible. Difficult to remember your own name. Control of movement and coordination are lost.   The worst pain imaginable 10 At this level, most patients pass out from pain. When this level is reached, collapse of the autonomic nervous system occurs, leading to a sudden drop in blood pressure and heart rate. This in turn results in a temporary and dramatic drop in blood flow to the brain, leading to a loss of consciousness. Fainting is one of the bodys self defense mechanisms. Passing out puts the brain in a calmed state and causes it to shut down for a while, in order to begin the healing process.    Summary: 1. Refer to this scale when providing [place] with your pain level. 2. Be accurate and careful when reporting your pain level. This will help with your care. 3. Over-reporting your pain level will lead to loss of credibility. 4. Even a level of 1/10 means that there is pain and will be treated at our facility. 5. High, inaccurate reporting  will be documented as Symptom Exaggeration, leading to loss of credibility and suspicions of possible secondary gains such as obtaining more narcotics, or wanting to appear disabled, for fraudulent reasons. 6. Only pain levels of  5 or below will be seen at our facility. 7. Pain levels of 6 and above will be sent to the Emergency Department and the appointment cancelled. ____________________________________________________________________________________________  ____________________________________________________________________________________________  DRUG HOLIDAYS  Definitions Tolerance: defined as the progressively decreased responsiveness to a drug. Occurs when the drug is used repeatedly and the body adapts to the continued presence of the drug. As a result, a larger dose of the drug is needed to achieve the effect originally obtained by a smaller dose. It is thought to be due to the formation of excess opioid receptors.  Drug Holiday: is when a patient stops taking a medication(s) for a period of time; anywhere from a few days to several weeks.  Withdrawals: refers to the wide range of symptoms that occur after stopping or dramatically reducing opiate drugs after heavy and prolonged use. Withdrawal symptoms do not occur to patients that use low dose opioids, or those who take the medication sporadically. Contrary to benzodiazepine (example: Valium, Xanax, etc.) or alcohol withdrawals (Delirium Tremens), opioid withdrawals are not lethal. Withdrawals are the physical manifestation of the body getting rid of the excess receptors.  Purpose To eliminate tolerance.  Duration of Holiday 14 consecutive days. (2 weeks)  Expected Symptoms Early symptoms of withdrawal include:  Agitation  Anxiety  Muscle aches  Increased tearing  Insomnia  Runny nose  Sweating  Yawning  Late symptoms of withdrawal include:  Abdominal cramping  Diarrhea  Dilated pupils  Goose bumps  Nausea  Vomiting  Opioid withdrawal reactions are very uncomfortable but are not life-threatening. Symptoms usually start within 12 hours of last opioid dose and within 30 hours of last methadone exposure.  Duration of  Symptoms 48 to 72 hours for short acting medications and 2 to 14 days for methadone.  Treatment  Clonidine (Catapres) or tizanidine (Zanaflex) for agitation, sweating, tearing, runny nose.  Promethazine (Phenergan) for nausea, vomiting.  NSAIDs for pain.  Benefits  Improved effectiveness of opioids.  Decreased opioid dose needed to achieve benefits.  Improved pain with lesser dose. ____________________________________________________________________________________________

## 2017-02-02 NOTE — Progress Notes (Signed)
Nursing Pain Medication Assessment:  On Krista Kennedy regular appointment, she did not comply with our medication refill policy of bringing her pills and bottle(s) to be examined and counted. As a consequence, her prescriptions were written and held, until she could bring back the medications to be examined. She comes in now to comply with this requirement.  Medication #1: Oxycodone/APAP Pill/Patch Count: 3 of 120 pills remain Bottle Appearance: Standard pharmacy container. Clearly labeled. Filled Date: 03 / 01 / 2018 Last Medication intake:  Ran out of medicine more than 48 hours ago  Medication #2: Oxycodone/APAP Pill/Patch Count: 5 of 90 pills remain Bottle Appearance: Standard pharmacy container. Clearly labeled. Filled Date: 05 / 07 / 2018 Last Medication intake:  Today  Medication #3: oxycontin 30mg  Pill/Patch Count: 36 of 60 pills remain Bottle Appearance: Standard pharmacy container. Clearly labeled. Filled Date: 05 / 19 / 2018 Last Medication intake:  Today    Date: 02/02/17; Time: 2:20 PM  is

## 2017-02-02 NOTE — Patient Instructions (Signed)
____________________________________________________________________________________________  Medication Rules  Applies to: All patients receiving prescriptions (written or electronic).  Pharmacy of record: Pharmacy where electronic prescriptions will be sent. If written prescriptions are taken to a different pharmacy, please inform the nursing staff. The pharmacy listed in the electronic medical record should be the one where you would like electronic prescriptions to be sent.  Prescription refills: Only during scheduled appointments. Applies to both, written and electronic prescriptions.  NOTE: The following applies primarily to controlled substances (Opioid Pain Medications)  Patient's responsibilities: 1. Pain Pills: Bring all pain pills to every appointment (except for procedure appointments). 2. Pill Bottles: Bring pills in original pharmacy bottle. Always bring newest bottle. Bring bottle, even if empty. 3. Medication refills: You are responsible for knowing and keeping track of what medications you need refilled. The day before your appointment, write a list of all prescriptions that need to be refilled. Bring that list to your appointment and give it to the admitting nurse. Prescriptions will be written only during appointments. If you forget a medication, it will not be "Called in", "Faxed", or "electronically sent". You will need to get another appointment to get these prescribed. 4. Prescription Accuracy: You are responsible for carefully inspecting your prescriptions before leaving our office. Have the discharge nurse carefully go over each prescription with you, before taking them home. Make sure that your name is accurately spelled, that your address is correct. Check the name and dose of your medication to make sure it is accurate. Check the number of pills, and the written instructions to make sure they are clear and accurate. Make sure that you are given enough medication to last  until your next medication refill appointment. 5. Taking Medication: Take medication as prescribed. Never take more pills than instructed. Never take medication more frequently than prescribed. Taking less pills or less frequently is permitted and encouraged, when it comes to controlled substances (written prescriptions).  6. Inform other Doctors: Always inform, all of your healthcare providers, of all the medications you take. 7. Pain Medication from other Providers: You are not allowed to accept any additional pain medication from any other Doctor or Healthcare provider. There are two exceptions to this rule. (see below) In the event that you require additional pain medication, you are responsible for notifying us, as stated below. 8. Medication Agreement: You are responsible for carefully reading and following our Medication Agreement. This must be signed before receiving any prescriptions from our practice. Safely store a copy of your signed Agreement. Violations to the Agreement will result in no further prescriptions. (Additional copies of our Medication Agreement are available upon request.) 9. Laws, Rules, & Regulations: All patients are expected to follow all [address] and [employer], Rules, [employer]. Ignorance of the Laws does not constitute a valid excuse.  Exceptions: There are only two exceptions to the rule of not receiving pain medications from other Healthcare Providers. 1. Exception #1 (Emergencies): In the event of an emergency (i.e.: accident requiring emergency care), you are allowed to receive additional pain medication. However, you are responsible for: As soon as you are able, call our office [phone], at any time of the day or night, and leave a message stating your name, the date and nature of the emergency, and the name and dose of the medication prescribed. In the event that your call is answered by a member of our staff, make sure to document and save the date,  time, and the name of the person that  took your information.  2. Exception #2 (Planned Surgery): In the event that you are scheduled by another doctor or dentist to have any type of surgery or procedure, you are allowed (for a period no longer than 30 days), to receive additional pain medication, for the acute post-op pain. However, in this case, you are responsible for picking up a copy of our "Post-op Pain Management for Surgeons" handout, and giving it to your surgeon or dentist. This document is available at our office, and does not require an appointment to obtain it. Simply go to our office during business hours (Monday-Thursday from 8:00 AM to 4:00 PM) (Friday 8:00 AM to 12:00 Noon) or if you have a scheduled appointment with [place], prior to your surgery, and ask for it by name. In addition, you will need to provide [place] with your name, name of your surgeon, type of surgery, and date of procedure or surgery.  ____________________________________________________________________________________________ ____________________________________________________________________________________________  Pain Scale  Introduction: The pain score used by this practice is the Verbal Numerical Rating Scale (VNRS-11). This is an 11-point scale. It is for adults and children 10 years or older. There are significant differences in how the pain score is reported, used, and applied. Forget everything you learned in the past and learn this scoring system.  General Information: The scale should reflect your current level of pain. Unless you are specifically asked for the level of your worst pain, or your average pain. If you are asked for one of these two, then it should be understood that it is over the past 24 hours.  Basic Activities of Daily Living (ADL): Personal hygiene, dressing, eating, transferring, and using restroom.  Instructions: Most patients tend to report their level of pain as a combination of two factors,  their physical pain and their psychosocial pain. This last one is also known as "suffering" and it is reflection of how physical pain affects you socially and psychologically. From now on, report them separately. From this point on, when asked to report your pain level, report only your physical pain. Use the following table for reference.  Pain Clinic Pain Levels (0-5/10)  Pain Level Score  Description  No Pain 0   Mild pain 1 Nagging, annoying, but does not interfere with basic activities of daily living (ADL). Patients are able to eat, bathe, get dressed, toileting (being able to get on and off the toilet and perform personal hygiene functions), transfer (move in and out of bed or a chair without assistance), and maintain continence (able to control bladder and bowel functions). Blood pressure and heart rate are unaffected. A normal heart rate for a healthy adult ranges from 60 to 100 bpm (beats per minute).   Mild to moderate pain 2 Noticeable and distracting. Impossible to hide from other people. More frequent flare-ups. Still possible to adapt and function close to normal. It can be very annoying and may have occasional stronger flare-ups. With discipline, patients may get used to it and adapt.   Moderate pain 3 Interferes significantly with activities of daily living (ADL). It becomes difficult to feed, bathe, get dressed, get on and off the toilet or to perform personal hygiene functions. Difficult to get in and out of bed or a chair without assistance. Very distracting. With effort, it can be ignored when deeply involved in activities.   Moderately severe pain 4 Impossible to ignore for more than a few minutes. With effort, patients may still be able to manage work or participate in some  social activities. Very difficult to concentrate. Signs of autonomic nervous system discharge are evident: dilated pupils (mydriasis); mild sweating (diaphoresis); sleep interference. Heart rate becomes elevated  (>115 bpm). Diastolic blood pressure (lower number) rises above 100 mmHg. Patients find relief in laying down and not moving.   Severe pain 5 Intense and extremely unpleasant. Associated with frowning face and frequent crying. Pain overwhelms the senses.  Ability to do any activity or maintain social relationships becomes significantly limited. Conversation becomes difficult. Pacing back and forth is common, as getting into a comfortable position is nearly impossible. Pain wakes you up from deep sleep. Physical signs will be obvious: pupillary dilation; increased sweating; goosebumps; brisk reflexes; cold, clammy hands and feet; nausea, vomiting or dry heaves; loss of appetite; significant sleep disturbance with inability to fall asleep or to remain asleep. When persistent, significant weight loss is observed due to the complete loss of appetite and sleep deprivation.  Blood pressure and heart rate becomes significantly elevated. Caution: If elevated blood pressure triggers a pounding headache associated with blurred vision, then the patient should immediately seek attention at an urgent or emergency care unit, as these may be signs of an impending stroke.    Emergency Department Pain Levels (6-10/10)  Emergency Room Pain 6 Severely limiting. Requires emergency care and should not be seen or managed at an outpatient pain management facility. Communication becomes difficult and requires great effort. Assistance to reach the emergency department may be required. Facial flushing and profuse sweating along with potentially dangerous increases in heart rate and blood pressure will be evident.   Distressing pain 7 Self-care is very difficult. Assistance is required to transport, or use restroom. Assistance to reach the emergency department will be required. Tasks requiring coordination, such as bathing and getting dressed become very difficult.   Disabling pain 8 Self-care is no longer possible. At this level,  pain is disabling. The individual is unable to do even the most "basic" activities such as walking, eating, bathing, dressing, transferring to a bed, or toileting. Fine motor skills are lost. It is difficult to think clearly.   Incapacitating pain 9 Pain becomes incapacitating. Thought processing is no longer possible. Difficult to remember your own name. Control of movement and coordination are lost.   The worst pain imaginable 10 At this level, most patients pass out from pain. When this level is reached, collapse of the autonomic nervous system occurs, leading to a sudden drop in blood pressure and heart rate. This in turn results in a temporary and dramatic drop in blood flow to the brain, leading to a loss of consciousness. Fainting is one of the body's self defense mechanisms. Passing out puts the brain in a calmed state and causes it to shut down for a while, in order to begin the healing process.    Summary: 1. Refer to this scale when providing [place] with your pain level. 2. Be accurate and careful when reporting your pain level. This will help with your care. 3. Over-reporting your pain level will lead to loss of credibility. 4. Even a level of 1/10 means that there is pain and will be treated at our facility. 5. High, inaccurate reporting will be documented as "Symptom Exaggeration", leading to loss of credibility and suspicions of possible secondary gains such as obtaining more narcotics, or wanting to appear disabled, for fraudulent reasons. 6. Only pain levels of 5 or below will be seen at our facility. 7. Pain levels of 6 and above will be sent  to the Emergency Department and the appointment cancelled. ____________________________________________________________________________________________  ____________________________________________________________________________________________  DRUG HOLIDAYS  Definitions Tolerance: defined as the progressively decreased responsiveness to a  drug. Occurs when the drug is used repeatedly and the body adapts to the continued presence of the drug. As a result, a larger dose of the drug is needed to achieve the effect originally obtained by a smaller dose. It is thought to be due to the formation of excess opioid receptors.  Drug Holiday: is when a patient stops taking a medication(s) for a period of time; anywhere from a few days to several weeks.  Withdrawals: refers to the wide range of symptoms that occur after stopping or dramatically reducing opiate drugs after heavy and prolonged use. Withdrawal symptoms do not occur to patients that use low dose opioids, or those who take the medication sporadically. Contrary to benzodiazepine (example: Valium, Xanax, etc.) or alcohol withdrawals ("Delirium Tremens"), opioid withdrawals are not lethal. Withdrawals are the physical manifestation of the body getting rid of the excess receptors.  Purpose To eliminate tolerance.  Duration of Holiday 14 consecutive days. (2 weeks)  Expected Symptoms Early symptoms of withdrawal include: . Agitation . Anxiety . Muscle aches . Increased tearing . Insomnia . Runny nose . Sweating . Yawning  Late symptoms of withdrawal include: . Abdominal cramping . Diarrhea . Dilated pupils . Goose bumps . Nausea . Vomiting  Opioid withdrawal reactions are very uncomfortable but are not life-threatening. Symptoms usually start within 12 hours of last opioid dose and within 30 hours of last methadone exposure.  Duration of Symptoms 48 to 72 hours for short acting medications and 2 to 14 days for methadone.  Treatment . Clonidine (CatapresT) or tizanidine (ZanaflexT) for agitation, sweating, tearing, runny nose. . Promethazine (PhenerganT) for nausea, vomiting. Marland Kitchen NSAIDs for pain.  Benefits . Improved effectiveness of opioids. . Decreased opioid dose needed to achieve benefits. . Improved pain with lesser  dose. ____________________________________________________________________________________________

## 2017-02-06 ENCOUNTER — Emergency Department
Admission: EM | Admit: 2017-02-06 | Discharge: 2017-02-06 | Disposition: A | Discharge status: Home / Self Care | Payer: Commercial Managed Care - PPO | Attending: Student in an Organized Health Care Education/Training Program | Admitting: Student in an Organized Health Care Education/Training Program

## 2017-02-06 DIAGNOSIS — I5022 Chronic systolic (congestive) heart failure: Secondary | ICD-10-CM | POA: Insufficient documentation

## 2017-02-06 DIAGNOSIS — Y999 Unspecified external cause status: Secondary | ICD-10-CM | POA: Diagnosis not present

## 2017-02-06 DIAGNOSIS — S39012A Strain of muscle, fascia and tendon of lower back, initial encounter: Secondary | ICD-10-CM | POA: Insufficient documentation

## 2017-02-06 DIAGNOSIS — Z87891 Personal history of nicotine dependence: Secondary | ICD-10-CM | POA: Insufficient documentation

## 2017-02-06 DIAGNOSIS — Y9389 Activity, other specified: Secondary | ICD-10-CM | POA: Insufficient documentation

## 2017-02-06 DIAGNOSIS — S3992XA Unspecified injury of lower back, initial encounter: Secondary | ICD-10-CM | POA: Diagnosis present

## 2017-02-06 DIAGNOSIS — Y92410 Unspecified street and highway as the place of occurrence of the external cause: Secondary | ICD-10-CM | POA: Insufficient documentation

## 2017-02-06 DIAGNOSIS — I110 Hypertensive heart disease with heart failure: Secondary | ICD-10-CM | POA: Diagnosis not present

## 2017-02-06 HISTORY — DX: Heart failure, unspecified: I50.9

## 2017-02-06 MED ORDER — KETOROLAC TROMETHAMINE 30 MG/ML IJ SOLN
30.0000 mg | Freq: Once | INTRAMUSCULAR | Status: AC
  Administered 2017-02-06: 30 mg via INTRAVENOUS
  Filled 2017-02-06: qty 1

## 2017-02-06 MED ORDER — KETOROLAC TROMETHAMINE 10 MG PO TABS: 10.0000 mg | ORAL_TABLET | Freq: Three times a day (TID) | ORAL | 0 refills | Status: DC

## 2017-02-06 MED ORDER — CYCLOBENZAPRINE HCL 5 MG PO TABS: 5.0000 mg | ORAL_TABLET | Freq: Three times a day (TID) | ORAL | 0 refills | Status: DC | PRN

## 2017-02-06 MED ORDER — ORPHENADRINE CITRATE 30 MG/ML IJ SOLN
60.0000 mg | INTRAMUSCULAR | Status: AC
  Administered 2017-02-06: 60 mg via INTRAMUSCULAR
  Filled 2017-02-06: qty 2
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
## 2017-02-20 ENCOUNTER — Telehealth: Payer: Self-pay | Admitting: *Deleted
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
## 2017-03-15 ENCOUNTER — Ambulatory Visit (INDEPENDENT_AMBULATORY_CARE_PROVIDER_SITE_OTHER): Payer: Commercial Managed Care - PPO | Admitting: Family Medicine

## 2017-03-15 ENCOUNTER — Encounter: Payer: Self-pay | Admitting: Family Medicine

## 2017-03-15 VITALS — BP 131/66 | HR 83 | Temp 97.4°F | Resp 16 | Ht 70.0 in | Wt 171.0 lb

## 2017-03-15 DIAGNOSIS — J301 Allergic rhinitis due to pollen: Secondary | ICD-10-CM | POA: Diagnosis not present

## 2017-03-15 DIAGNOSIS — L4050 Arthropathic psoriasis, unspecified: Secondary | ICD-10-CM | POA: Diagnosis not present

## 2017-03-15 DIAGNOSIS — Z79899 Other long term (current) drug therapy: Secondary | ICD-10-CM

## 2017-03-15 DIAGNOSIS — H6983 Other specified disorders of Eustachian tube, bilateral: Secondary | ICD-10-CM

## 2017-03-15 DIAGNOSIS — Z0000 Encounter for general adult medical examination without abnormal findings: Secondary | ICD-10-CM | POA: Diagnosis not present

## 2017-03-15 DIAGNOSIS — R7309 Other abnormal glucose: Secondary | ICD-10-CM | POA: Insufficient documentation

## 2017-03-15 DIAGNOSIS — F1120 Opioid dependence, uncomplicated: Secondary | ICD-10-CM | POA: Diagnosis not present

## 2017-03-15 DIAGNOSIS — Z1239 Encounter for other screening for malignant neoplasm of breast: Secondary | ICD-10-CM

## 2017-03-15 DIAGNOSIS — I5022 Chronic systolic (congestive) heart failure: Secondary | ICD-10-CM

## 2017-03-15 DIAGNOSIS — E785 Hyperlipidemia, unspecified: Secondary | ICD-10-CM | POA: Insufficient documentation

## 2017-03-15 DIAGNOSIS — E039 Hypothyroidism, unspecified: Secondary | ICD-10-CM | POA: Diagnosis not present

## 2017-03-15 DIAGNOSIS — G894 Chronic pain syndrome: Secondary | ICD-10-CM

## 2017-03-15 DIAGNOSIS — I10 Essential (primary) hypertension: Secondary | ICD-10-CM | POA: Diagnosis not present

## 2017-03-15 DIAGNOSIS — E782 Mixed hyperlipidemia: Secondary | ICD-10-CM | POA: Diagnosis not present

## 2017-03-15 DIAGNOSIS — H65191 Other acute nonsuppurative otitis media, right ear: Secondary | ICD-10-CM

## 2017-03-15 DIAGNOSIS — T50995A Adverse effect of other drugs, medicaments and biological substances, initial encounter: Secondary | ICD-10-CM

## 2017-03-15 MED ORDER — IPRATROPIUM BROMIDE 0.06 % NA SOLN: 2.0000 | Freq: Four times a day (QID) | NASAL | 0 refills | Status: DC

## 2017-03-15 MED ORDER — PREDNISONE 10 MG PO TABS: ORAL_TABLET | ORAL | 0 refills | Status: DC

## 2017-03-15 MED ORDER — FLUTICASONE PROPIONATE 50 MCG/ACT NA SUSP: 2.0000 | Freq: Every day | NASAL | 3 refills | Status: DC

## 2017-03-15 MED ORDER — DIGOXIN 250 MCG PO TABS: 0.2500 mg | ORAL_TABLET | Freq: Every day | ORAL | 1 refills | Status: DC

## 2017-03-15 MED ORDER — LISINOPRIL 5 MG PO TABS: 5.0000 mg | ORAL_TABLET | Freq: Every day | ORAL | 0 refills | Status: DC

## 2017-03-15 NOTE — Progress Notes (Signed)
Subjective:    Patient ID: Kamiyah Shostak, female    DOB: 23-Jan-1966, 51 y.o.   MRN: [id number]  Melanee Karnik is a 51 y.o. female presenting on 03/15/2017 for Annual Exam   HPI   Specialist: Cardiology - Dr Acie Fredrickson ([facility] [place]) [facility]  Allergic Dermatitis / Reaction of Scalp - Reports hair dyed yesterday, states she has some allergies to certain hair dyes, and afterwards experienced swelling of forehead, and knot, scabbing, related to Psoriasis of scalp - Gradual improvement today, put topical steroid on with some relief - In past received Prednisone with relief for similar flare, requesting rx today  History of Systolic CHF / Postpartum cardiomyopathy 2002 - Previously followed by Cardiology Dr Acie Fredrickson, was seen in 2014 with mostly normal ECHO with preserved EF at that time. Takes Digoxin chronically, also has Furosemide PRN infrequently uses for swelling. She has not followed up routinely with Cardiology, plans to schedule. - Request refill on Digoxin  Rhinosinusitis, subacute / Eustachian Tube Dysfunction - Reports additional complaint today with sinusitis symptoms, describes sinus pain and pressure bilateral, associated with R > L ear pain and pressure. Onset few weeks ago had cold symptoms, then improved, then worsening again over past 2 weeks, worse in morning with sinus congestion, sometimes difficulty breathing out of nose. - Denies fevers/chills, purulence  Hypothyroidism: - Chronic problem, has been taking Levothyroxine 171mcg regularly, last checked 03/2016 with low TSH result, but unsure if med was changed, due for repeat labs  Psoriasis / complicated with psoriatic arthritis - Chronic problem with both skin involvement and arthritis. Followed by [facility]. - Has been on variety of biologic agents, now most recently Taltz injections with good results.  Health Maintenance: - Prior mammogram normal, due for next screening mammo, will schedule,  due for order today. No known personal or family history of breast cancer - Colon CA Screening: Due for Colonoscopy, age 73, no prior screening for colon cancer, no known family history. Asymptomatic. Interested in cologuard, will notify office when ready   Past Medical History:  Diagnosis Date  . Cardiomyopathy ([place])   . CHF (congestive heart failure) ([place])   . Hyperlipidemia   . Hypertension   . Psoriasis   . Thyroid disease    Past Surgical History:  Procedure Laterality Date  . ENDOMETRIAL ABLATION  2015  . THYROIDECTOMY    . TONSILLECTOMY    . TUBAL LIGATION     Social History   Social History  . Marital status: Married    Spouse name: N/A  . Number of children: N/A  . Years of education: N/A   Occupational History  . Not on file.   Social History Main Topics  . Smoking status: Former Smoker    Packs/day: 0.25    Years: 15.00    Types: Cigarettes  . Smokeless tobacco: Former Systems developer  . Alcohol use No  . Drug use: No  . Sexual activity: Yes   Other Topics Concern  . Not on file   Social History Narrative  . No narrative on file   Family History  Problem Relation Age of Onset  . Hypertension Mother   . Hyperlipidemia Mother   . Heart disease Father        CABG   . Hyperlipidemia Father   . Hypertension Father    Current Outpatient Prescriptions on File Prior to Visit  Medication Sig  . Ascorbic Acid (VITAMIN C) 1000 MG tablet Take 1,000 mg by mouth daily.  . carvedilol (  COREG) 25 MG tablet Take 1 tablet (25 mg total) by mouth 2 (two) times daily with a meal.  . Cholecalciferol (VITAMIN D-1000 MAX ST) 1000 UNITS tablet Take by mouth.  . Ixekizumab (TALTZ) 80 MG/ML SOSY Inject 80 mg into the skin as directed.  Marland Kitchen levothyroxine (SYNTHROID, LEVOTHROID) 125 MCG tablet Take 1 tablet (125 mcg total) by mouth daily.  . meloxicam (MOBIC) 7.5 MG tablet Take by mouth daily.   Derrill Memo ON 03/22/2017] oxyCODONE 30 MG 12 hr tablet Take 30 mg by mouth every 12 (twelve)  hours.  Marland Kitchen oxyCODONE 30 MG 12 hr tablet Take 1 tablet by mouth every 12 (twelve) hours.  Marland Kitchen oxyCODONE-acetaminophen (PERCOCET) 10-325 MG tablet Take 1 tablet by mouth 2 (two) times daily.  Marland Kitchen venlafaxine XR (EFFEXOR-XR) 75 MG 24 hr capsule Take 1 capsule (75 mg total) by mouth daily with breakfast.  . betamethasone dipropionate (DIPROLENE) 0.05 % ointment   . fluocinonide (LIDEX) 0.05 % external solution   . furosemide (LASIX) 40 MG tablet Take 40 mg by mouth daily as needed. Reported on 01/13/2016  . naloxone [facility]) nasal spray 4 mg/0.1 mL To reverse excess sedation from narcotics (Patient not taking: Reported on 03/15/2017)   No current facility-administered medications on file prior to visit.     Review of Systems  Constitutional: Negative for activity change, appetite change, chills, diaphoresis, fatigue and fever.  HENT: Positive for congestion, facial swelling, postnasal drip and sinus pressure. Negative for hearing loss.   Eyes: Negative for visual disturbance.  Respiratory: Negative for apnea, cough, choking, chest tightness, shortness of breath and wheezing.   Cardiovascular: Negative for chest pain, palpitations and leg swelling.  Gastrointestinal: Negative for abdominal pain, anal bleeding, blood in stool, constipation, diarrhea, nausea and vomiting.  Endocrine: Negative for cold intolerance and polyuria.  Genitourinary: Negative for decreased urine volume, difficulty urinating, dysuria, frequency and hematuria.  Musculoskeletal: Positive for arthralgias. Negative for back pain and neck pain.  Skin: Negative for rash.  Allergic/Immunologic: Positive for environmental allergies.  Neurological: Negative for dizziness, weakness, light-headedness, numbness and headaches.  Hematological: Negative for adenopathy.  Psychiatric/Behavioral: Negative for behavioral problems, dysphoric mood and sleep disturbance. The patient is not nervous/anxious.    Per HPI unless specifically indicated  above      Objective:    BP 131/66   Pulse 83   Temp (!) 97.4 F (36.3 C) (Oral)   Resp 16   Ht 5\' 10"  (1.778 m)   Wt 171 lb (77.6 kg)   BMI 24.54 kg/m   Wt Readings from Last 3 Encounters:  03/15/17 171 lb (77.6 kg)  02/06/17 160 lb (72.6 kg)  02/02/17 160 lb (72.6 kg)    Physical Exam  Constitutional: She is oriented to person, place, and time. She appears well-developed and well-nourished. No distress.  Well-appearing, comfortable, cooperative  HENT:  Head: Normocephalic and atraumatic.  Mouth/Throat: Oropharynx is clear and moist.  Frontal / maxillary sinuses non-tender. Nares only minimally patent with significant turbinate edema without active congestion or purulence. Bilateral TMs with effusion (R opaque and L mostly clear) some fullness to bulging on R without erythema. Oropharynx clear with mild evidence of post nasal drainage, without erythema, exudates, edema or asymmetry.  Eyes: Conjunctivae and EOM are normal. Pupils are equal, round, and reactive to light. Right eye exhibits no discharge. Left eye exhibits no discharge.  Neck: Normal range of motion. Neck supple. No thyromegaly present.  Cardiovascular: Normal rate, regular rhythm, normal heart sounds and intact  distal pulses.   No murmur heard. Pulmonary/Chest: Effort normal and breath sounds normal. No respiratory distress. She has no wheezes. She has no rales.  Abdominal: Soft. Bowel sounds are normal. She exhibits no distension and no mass. There is no tenderness.  Musculoskeletal: Normal range of motion. She exhibits no edema or tenderness.  Upper / Lower Extremities: - Normal muscle tone, strength bilateral upper extremities 5/5, lower extremities 5/5  Lymphadenopathy:    She has no cervical adenopathy.  Neurological: She is alert and oriented to person, place, and time.  Distal sensation intact to light touch all extremities  Skin: Skin is warm and dry. Rash (Scalp consistent with mild resolving dry  flaking slightly erythematous skin) noted. She is not diaphoretic. No erythema.  Psychiatric: She has a normal mood and affect. Her behavior is normal.  Well groomed, good eye contact, normal speech and thoughts  Nursing note and vitals reviewed.  Results for orders placed or performed in visit on 12/08/16  ToxASSURE Select 13 (MW), Urine  Result Value Ref Range   Summary FINAL       Assessment & Plan:   Problem List Items Addressed This Visit    Psoriasis with arthropathy ([place])    Stable chronic problems, improved skin lesions on biologic Taltz from Dermatology. Chronic arthritis with chronic pain and limitations with L hip, followed by [facility], controlled on opioids, reviewed [place] CSRS - Follow-up as planned      Relevant Medications   predniSONE (DELTASONE) 10 MG tablet   Opioid dependence, daily use ([place])    Secondary to psoriatic arthritis, multiple joints, mostly L Hip, followed by [facility], stable on opiates - Checked [place] CSRS      Essential hypertension    Stable, controlled HTN Continue current meds      Relevant Medications   digoxin (LANOXIN) 0.25 MG tablet   lisinopril (PRINIVIL,ZESTRIL) 5 MG tablet   Chronic systolic heart failure (HCC)    Stable and euvolemic chronic CHF with history s/p postpartum cardiomyopathy in 2002, seems to have restored EF, last ECHO 2014. No longer following up with Cardio - Continues Digoxin, prior levels normal - Rare use of Lasix - Recommend return to cardiology as scheduled      Relevant Medications   digoxin (LANOXIN) 0.25 MG tablet   lisinopril (PRINIVIL,ZESTRIL) 5 MG tablet   Chronic pain syndrome    Secondary to psoriatic arthritis, multiple joints, mostly L Hip, followed by [facility], stable on opiates - Checked [place] CSRS      Allergic rhinitis due to pollen    Suspected etiology for sinus symptoms without evidence of sinusitis. Likely related to eustachian tube dysfunction and  effusion  Plan: 1. Start nasal steroid Flonase 2 sprays in each nostril daily for 4-6 weeks, may repeat course seasonally or as needed 2. Also start Atrovent nasal spray decongestant 2 sprays in each nostril up to 4 times daily for 7 days 3. Already treating with prednisone burst for rash/psoriatic flare, may help eustachian tubes and sinus tissues 4. Continue OTC anti-histamine 5. Follow-up - return criteria if worsening concern for bacterial sinusitis, may send in antibiotic      Relevant Medications   fluticasone (FLONASE) 50 MCG/ACT nasal spray   ipratropium (ATROVENT) 0.06 % nasal spray    Other Visit Diagnoses    Annual physical exam    -  Primary   Screening for breast cancer      - Ordered Mammogram, patient to call  to schedule    Relevant Orders   MM DIGITAL SCREENING BILATERAL   Dye allergic reaction, initial encounter      - Secondary to hair dye, mild localized scalp reaction vs flare of psoriatic skin lesion - Trial on prednisone burst 6 day taper    Relevant Medications   predniSONE (DELTASONE) 10 MG tablet   Chronic systolic congestive heart failure (HCC)       Relevant Medications   digoxin (LANOXIN) 0.25 MG tablet   lisinopril (PRINIVIL,ZESTRIL) 5 MG tablet   Eustachian tube dysfunction, bilateral      Subacute on bilateral eustachian tube dysfunction with secondary R > L ear effusion, without loss of hearing or evidence of AOM or sinusitis. Likely related allergic rhinosinusitis based on exam and history. - Inadequate conservative therapy  Plan: 1. Start Flonase 2 sprays each nare daily for up to 4-6 weeks or longer 2. Start Atrovent nasal spray decongestant 2 sprays in each nostril up to 4 times daily for 7 days 3. Start OTC oral anti-histamine daily 4. Already treating with prednisone for scalp reaction, may help ETD 5. Follow-up if not improved or worsening, return criteria    Relevant Medications   ipratropium (ATROVENT) 0.06 % nasal spray   Acute  effusion of right ear       Relevant Medications   ipratropium (ATROVENT) 0.06 % nasal spray      Meds ordered this encounter  Medications  . clobetasol ointment (TEMOVATE) 0.05 %  . predniSONE (DELTASONE) 10 MG tablet    Sig: Take 6 tabs with breakfast Day 1, 5 tabs Day 2, 4 tabs Day 3, 3 tabs Day 4, 2 tabs Day 5, 1 tab Day 6.    Dispense:  21 tablet    Refill:  0  . digoxin (LANOXIN) 0.25 MG tablet    Sig: Take 1 tablet (0.25 mg total) by mouth daily.    Dispense:  90 tablet    Refill:  1  . fluticasone (FLONASE) 50 MCG/ACT nasal spray    Sig: Place 2 sprays into both nostrils daily.    Dispense:  16 g    Refill:  3  . lisinopril (PRINIVIL,ZESTRIL) 5 MG tablet    Sig: Take 1 tablet (5 mg total) by mouth daily.    Dispense:  90 tablet    Refill:  0  . ipratropium (ATROVENT) 0.06 % nasal spray    Sig: Place 2 sprays into both nostrils 4 (four) times daily. For up to 5-7 days then stop.    Dispense:  15 mL    Refill:  0    Follow up plan: Return in about 1 year (around 03/15/2018) for Annual Physical.   Patient to return within 1 week for fasting lab only apt, she was not fasting today and did not have labs in advance.  Nobie Putnam, [facility] 03/15/2017, 10:07 PM

## 2017-03-15 NOTE — Patient Instructions (Addendum)
Thank you for coming to the clinic today.  1. It sounds like you have persistent Sinus Congestion or "Rhinosinusitis" - I do not think that this is a Bacterial Sinus Infection. Usually these are caused by Viruses or Allergies, and will run it's course in about 7 to 10 days. - No antibiotics are needed  - Start Prednisone taper  Start Atrovent nasal spray decongestant 2 sprays in each nostril up to 4 times daily for 7 days  - Start Flonase 2 sprays in each nostril daily for next 4-6 weeks, then you may stop and use seasonally or as needed  - Recommend to keep using Nasal Saline spray multiple times a day to help flush out congestion and clear sinuses - May try anti-histamine, Loratadine Claritin, Zyrtec or Cetirizine in future - Improve hydration by drinking plenty of clear fluids (water, gatorade) to reduce secretions and thin congestion - Congestion draining down throat can cause irritation. May try warm herbal tea with honey, cough drops - Can take Tylenol as needed  If congestion is too much, can try OTC Mucinex as well to help clear congestion for up to 1 week.  If you develop persistent fever >101F for at least 3 consecutive days, headaches with sinus pain or pressure or persistent earache, please schedule a follow-up evaluation within next few days to week.  For Mammogram screening for breast cancer   Call the [facility] below anytime to schedule your own appointment now that order has been placed.  [facility] [place], [place] [address] Phone: [phone]  Colon Cancer Screening: - For all adults age 59+ routine colon cancer screening is highly recommended.     - Recent guidelines from [place] recommend starting age of 32 - Early detection of colon cancer is important, because often there are no warning signs or symptoms, also if found early usually it can be cured. Late stage is hard to  treat.  - If you are not interested in Colonoscopy screening (if done and normal you could be cleared for 5 to 10 years until next due), then Cologuard is an excellent alternative for screening test for Colon Cancer. It is highly sensitive for detecting DNA of colon cancer from even the earliest stages. Also, there is NO bowel prep required. - If Cologuard is NEGATIVE, then it is good for 3 years before next due - If Cologuard is POSITIVE, then it is strongly advised to get a Colonoscopy, which allows the GI doctor to locate the source of the cancer or polyp (even very early stage) and treat it by removing it. ------------------------- If you would like to proceed with Cologuard (stool DNA test) - FIRST, call your insurance company and tell them you want to check cost of Cologuard tell them CPT Code [phone] (it may be completely covered and you could get for no cost, OR max cost without any coverage is about $600). Also, keep in mind if you do NOT open the kit, and decide not to do the test, you will NOT be charged, you should contact the company if you decide not to do the test. - If you want to proceed, you can notify us (phone message, [place], or at next visit) and we will order it for you. The test kit will be delivered to you house within about 1 week. Follow instructions to collect sample, you may call the company for any help or questions, 24/7 telephone support  at [phone].  You will be due for FASTING BLOOD WORK (no food or drink after midnight before, only water or coffee without cream/sugar on the morning of)  - Please go ahead and schedule a "Lab Only" visit in the morning at the clinic for lab draw in 1 weeks  - Make sure Lab Only appointment is at least 1-2 weeks before your next appointment, so that results will be available  For Lab Results, once available within 2-3 days of blood draw, you can can log in to MyChart online to view your results and a brief explanation.  Also, we can discuss results at next follow-up visit.  Please schedule a Follow-up Appointment to: Return in about 1 year (around 03/15/2018) for Annual Physical.   CALL 1 month before your next physical in 1 year, for order lab work.  If you have any other questions or concerns, please feel free to call the clinic or send a message through [place]. You may also schedule an earlier appointment if necessary.  Additionally, you may be receiving a survey about your experience at our clinic within a few days to 1 week by e-mail or mail. We value your feedback.  Nobie Putnam, DO [place]

## 2017-03-15 NOTE — Assessment & Plan Note (Signed)
Secondary to psoriatic arthritis, multiple joints, mostly L Hip, followed by [facility], stable on opiates - Checked [place] CSRS

## 2017-03-15 NOTE — Assessment & Plan Note (Signed)
Suspected etiology for sinus symptoms without evidence of sinusitis. Likely related to eustachian tube dysfunction and effusion  Plan: 1. Start nasal steroid Flonase 2 sprays in each nostril daily for 4-6 weeks, may repeat course seasonally or as needed 2. Also start Atrovent nasal spray decongestant 2 sprays in each nostril up to 4 times daily for 7 days 3. Already treating with prednisone burst for rash/psoriatic flare, may help eustachian tubes and sinus tissues 4. Continue OTC anti-histamine 5. Follow-up - return criteria if worsening concern for bacterial sinusitis, may send in antibiotic

## 2017-03-15 NOTE — Assessment & Plan Note (Signed)
Secondary to psoriatic arthritis, multiple joints, mostly L Hip, followed by [facility], stable on opiates - Checked [place] CSRS

## 2017-03-15 NOTE — Assessment & Plan Note (Signed)
Stable, controlled HTN Continue current meds

## 2017-03-15 NOTE — Assessment & Plan Note (Signed)
Stable chronic problems, improved skin lesions on biologic Taltz from Dermatology. Chronic arthritis with chronic pain and limitations with L hip, followed by [facility], controlled on opioids, reviewed [place] CSRS - Follow-up as planned

## 2017-03-15 NOTE — Assessment & Plan Note (Signed)
Stable and euvolemic chronic CHF with history s/p postpartum cardiomyopathy in 2002, seems to have restored EF, last ECHO 2014. No longer following up with Cardio - Continues Digoxin, prior levels normal - Rare use of Lasix - Recommend return to cardiology as scheduled

## 2017-03-30 ENCOUNTER — Other Ambulatory Visit: Payer: Self-pay | Admitting: Family Medicine

## 2017-03-30 DIAGNOSIS — F3341 Major depressive disorder, recurrent, in partial remission: Secondary | ICD-10-CM

## 2017-04-04 ENCOUNTER — Other Ambulatory Visit: Payer: Commercial Managed Care - PPO

## 2017-04-11 ENCOUNTER — Other Ambulatory Visit: Payer: Self-pay

## 2017-04-11 ENCOUNTER — Encounter: Payer: Self-pay | Admitting: Nurse Practitioner

## 2017-04-11 ENCOUNTER — Ambulatory Visit: Payer: Commercial Managed Care - PPO | Attending: Nurse Practitioner | Admitting: Nurse Practitioner

## 2017-04-11 VITALS — BP 162/84 | HR 77 | Temp 98.1°F | Resp 16 | Ht 70.0 in | Wt 160.0 lb

## 2017-04-11 DIAGNOSIS — I10 Essential (primary) hypertension: Secondary | ICD-10-CM

## 2017-04-11 DIAGNOSIS — M25552 Pain in left hip: Secondary | ICD-10-CM

## 2017-04-11 DIAGNOSIS — Z882 Allergy status to sulfonamides status: Secondary | ICD-10-CM | POA: Diagnosis not present

## 2017-04-11 DIAGNOSIS — M25532 Pain in left wrist: Secondary | ICD-10-CM

## 2017-04-11 DIAGNOSIS — E039 Hypothyroidism, unspecified: Secondary | ICD-10-CM

## 2017-04-11 DIAGNOSIS — Z9889 Other specified postprocedural states: Secondary | ICD-10-CM | POA: Diagnosis not present

## 2017-04-11 DIAGNOSIS — E785 Hyperlipidemia, unspecified: Secondary | ICD-10-CM | POA: Diagnosis not present

## 2017-04-11 DIAGNOSIS — M25551 Pain in right hip: Secondary | ICD-10-CM | POA: Diagnosis not present

## 2017-04-11 DIAGNOSIS — Z8249 Family history of ischemic heart disease and other diseases of the circulatory system: Secondary | ICD-10-CM | POA: Insufficient documentation

## 2017-04-11 DIAGNOSIS — Z87891 Personal history of nicotine dependence: Secondary | ICD-10-CM | POA: Insufficient documentation

## 2017-04-11 DIAGNOSIS — Z888 Allergy status to other drugs, medicaments and biological substances status: Secondary | ICD-10-CM | POA: Diagnosis not present

## 2017-04-11 DIAGNOSIS — E079 Disorder of thyroid, unspecified: Secondary | ICD-10-CM

## 2017-04-11 DIAGNOSIS — E890 Postprocedural hypothyroidism: Secondary | ICD-10-CM | POA: Diagnosis not present

## 2017-04-11 DIAGNOSIS — M533 Sacrococcygeal disorders, not elsewhere classified: Secondary | ICD-10-CM

## 2017-04-11 DIAGNOSIS — Z79899 Other long term (current) drug therapy: Secondary | ICD-10-CM | POA: Insufficient documentation

## 2017-04-11 DIAGNOSIS — I110 Hypertensive heart disease with heart failure: Secondary | ICD-10-CM | POA: Diagnosis not present

## 2017-04-11 DIAGNOSIS — G8929 Other chronic pain: Secondary | ICD-10-CM | POA: Insufficient documentation

## 2017-04-11 DIAGNOSIS — G894 Chronic pain syndrome: Secondary | ICD-10-CM

## 2017-04-11 DIAGNOSIS — I429 Cardiomyopathy, unspecified: Secondary | ICD-10-CM | POA: Insufficient documentation

## 2017-04-11 DIAGNOSIS — E782 Mixed hyperlipidemia: Secondary | ICD-10-CM

## 2017-04-11 DIAGNOSIS — I509 Heart failure, unspecified: Secondary | ICD-10-CM | POA: Insufficient documentation

## 2017-04-11 DIAGNOSIS — I5022 Chronic systolic (congestive) heart failure: Secondary | ICD-10-CM

## 2017-04-11 DIAGNOSIS — Z0000 Encounter for general adult medical examination without abnormal findings: Secondary | ICD-10-CM

## 2017-04-11 DIAGNOSIS — R7309 Other abnormal glucose: Secondary | ICD-10-CM

## 2017-04-11 LAB — CBC WITH DIFFERENTIAL/PLATELET
BASOS ABS: 62 {cells}/uL (ref 0–200)
Basophils Relative: 1 %
EOS ABS: 124 {cells}/uL (ref 15–500)
Eosinophils Relative: 2 %
HEMATOCRIT: 42 % (ref 35.0–45.0)
HEMOGLOBIN: 13.7 g/dL (ref 11.7–15.5)
LYMPHS ABS: 1736 {cells}/uL (ref 850–3900)
Lymphocytes Relative: 28 %
MCH: 29.4 pg (ref 27.0–33.0)
MCHC: 32.6 g/dL (ref 32.0–36.0)
MCV: 90.1 fL (ref 80.0–100.0)
MPV: 9.7 fL (ref 7.5–12.5)
Monocytes Absolute: 434 cells/uL (ref 200–950)
Monocytes Relative: 7 %
NEUTROS PCT: 62 %
Neutro Abs: 3844 cells/uL (ref 1500–7800)
Platelets: 217 10*3/uL (ref 140–400)
RBC: 4.66 MIL/uL (ref 3.80–5.10)
RDW: 13.4 % (ref 11.0–15.0)
WBC: 6.2 10*3/uL (ref 3.8–10.8)

## 2017-04-11 MED ORDER — OXYCODONE-ACETAMINOPHEN 10-325 MG PO TABS: 1.0000 | ORAL_TABLET | Freq: Three times a day (TID) | ORAL | 0 refills | Status: AC | PRN

## 2017-04-11 MED ORDER — OXYCODONE-ACETAMINOPHEN 10-325 MG PO TABS: 1.0000 | ORAL_TABLET | Freq: Three times a day (TID) | ORAL | 0 refills | Status: DC | PRN

## 2017-04-11 MED ORDER — OXYCODONE HCL ER 30 MG PO T12A: 30.0000 mg | EXTENDED_RELEASE_TABLET | Freq: Two times a day (BID) | ORAL | 0 refills | Status: DC

## 2017-04-11 NOTE — Patient Instructions (Signed)

## 2017-04-11 NOTE — Progress Notes (Signed)
Patient's Name: Krista Kennedy  MRN: [id number]  Referring Provider: No ref. provider found  DOB: 1965/12/02  PCP: Olin Hauser, DO  DOS: 04/11/2017  Note by: Vevelyn Francois NP  Service setting: Ambulatory outpatient  Specialty: Interventional Pain Management  Location: [facility] (AMB) Pain Management Facility    Patient type: Established    Primary Reason(s) for Visit: Encounter for prescription drug management. (Level of risk: moderate)  CC: Wrist Pain (left) and Hip Pain (both)  HPI  Krista Kennedy is a 51 y.o. year old, female patient, who comes today for a medication management evaluation. Krista Kennedy has Chronic pain syndrome; Hip pain, bilateral; Pain in multiple finger joints; Therapeutic opioid induced constipation;. Her primarily concern today is the Wrist Pain (left) and Hip Pain (both)  Pain Assessment: Location: Left Wrist Radiating: stays in the wrist area; in the past, pain in the thumb (left) Onset: More than a month ago Duration: Chronic pain Quality: Aching, Constant, Sharp, Shooting Severity: 4 /10 (self-reported pain score)  Note: Reported level is compatible with observation.                   Effect on ADL: pace self Timing: Constant Modifying factors: medicine,   Krista Kennedy was last scheduled for an appointment on 02/02/2017 for medication management. During today's appointment we reviewed Krista Kennedy's chronic pain status, as well as her outpatient medication regimen. Krista Kennedy states that her Percocet 2 tabs is not effective. Krista Kennedy states that Krista Kennedy is taking [employer] and BC. Krista Kennedy states that Krista Kennedy is not able to sleep. Krista Kennedy states that Krista Kennedy is not had any recent images.   The patient  reports that Krista Kennedy does not use drugs. Her body mass index is 22.96 kg/m.  Further details on both, my assessment(s), as well as the proposed treatment plan, please see below.  Controlled Substance Pharmacotherapy Assessment REMS (Risk Evaluation and Mitigation Strategy)  Analgesic: Oxycodone ER 30 mg  tablets twice daily, oxycodone/acetaminophen mg 10/325 3 times daily (oxycodone 90 mg per day) MME/day: 135 mg/day.  Lona Millard, RN  04/11/2017  9:57 AM  Sign at close encounter Nursing Pain Medication Assessment:  Safety precautions to be maintained throughout the outpatient stay will include: orient to surroundings, keep bed in low position, maintain call bell within reach at all times, provide assistance with transfer out of bed and ambulation.  Medication Inspection Compliance: Pill count conducted under aseptic conditions, in front of the patient. Neither the pills nor the bottle was removed from the patient's sight at any time. Once count was completed pills were immediately returned to the patient in their original bottle.  Medication: Oxycodone ER (OxyContin) Pill/Patch Count: 28 of 60 pills remain Pill/Patch Appearance: Markings consistent with prescribed medication Bottle Appearance: Standard pharmacy container. Clearly labeled. Filled Date: 07 / 22 / 2018 Last Medication intake:  Today   Pharmacokinetics: Liberation and absorption (onset of action): WNL Distribution (time to peak effect): WNL Metabolism and excretion (duration of action): WNL         Pharmacodynamics: Desired effects: Analgesia: Krista Kennedy reports >50% benefit. Functional ability: Patient reports that medication allows her to accomplish basic ADLs Clinically meaningful improvement in function (CMIF): Sustained CMIF goals met Perceived effectiveness: Described as relatively effective, allowing for increase in activities of daily living (ADL) Undesirable effects: Side-effects or Adverse reactions: None reported Monitoring: [place] PMP: Online review of the past 22-monthperiod conducted. Compliant with practice rules and regulations List of all UDS test(s) done:  Lab Results  Component Value Date   TOXASSSELUR FINAL 06/27/2016   TOXASSSELUR FINAL 11/04/2015   TOXASSSELUR FINAL 10/05/2015   TOXASSSELUR FINAL  09/08/2015   SUMMARY FINAL 12/08/2016   Last UDS on record: ToxAssure Select 13  Date Value Ref Range Status  06/27/2016 FINAL  Final    Comment:    ==================================================================== TOXASSURE SELECT 13 (MW) ==================================================================== Test                             Result       Flag       Units Drug Present and Declared for Prescription Verification   Oxycodone                      >3704        EXPECTED   ng/mg creat   Oxymorphone                    3554         EXPECTED   ng/mg creat   Noroxycodone                   >3704        EXPECTED   ng/mg creat   Noroxymorphone                 872          EXPECTED   ng/mg creat    Sources of oxycodone are scheduled prescription medications.    Oxymorphone, noroxycodone, and noroxymorphone are expected    metabolites of oxycodone. Oxymorphone is also available as a    scheduled prescription medication. ==================================================================== Test                      Result    Flag   Units      Ref Range   Creatinine              270              mg/dL      >=20 ==================================================================== Declared Medications:  The flagging and interpretation on this report are based on the  following declared medications.  Unexpected results may arise from  inaccuracies in the declared medications.  **Note: The testing scope of this panel includes these medications:  Oxycodone  Oxycodone (Percocet)  **Note: The testing scope of this panel does not include following  reported medications:  Acetaminophen (Percocet)  Betamethasone (Diprolene)  Carvedilol (Coreg)  Cholecalciferol  Clindamycin (Cleocin)  Digoxin  Fluticasone (Flonase)  Furosemide (Lasix)  Levothyroxine  Lisinopril  Meloxicam (Mobic)  Metronidazole (Flagyl)  Olopatadine (patanol)  Topical  Topical (Lidex)  Venlafaxine  Vitamin  C ==================================================================== For clinical consultation, please call [phone]. ====================================================================    Summary  Date Value Ref Range Status  12/08/2016 FINAL  Final    Comment:    ==================================================================== TOXASSURE SELECT 13 (MW) ==================================================================== Test                             Result       Flag       Units Drug Present and Declared for Prescription Verification   Oxycodone                      6012         EXPECTED   ng/mg creat  Oxymorphone                    4933         EXPECTED   ng/mg creat   Noroxycodone                   >6536        EXPECTED   ng/mg creat   Noroxymorphone                 1644         EXPECTED   ng/mg creat    Sources of oxycodone are scheduled prescription medications.    Oxymorphone, noroxycodone, and noroxymorphone are expected    metabolites of oxycodone. Oxymorphone is also available as a    scheduled prescription medication. ==================================================================== Test                      Result    Flag   Units      Ref Range   Creatinine              153              mg/dL      >=20 ==================================================================== Declared Medications:  The flagging and interpretation on this report are based on the  following declared medications.  Unexpected results may arise from  inaccuracies in the declared medications.  **Note: The testing scope of this panel includes these medications:  Oxycodone  Oxycodone (Percocet)  **Note: The testing scope of this panel does not include following  reported medications:  Acetaminophen (Percocet)  Betamethasone (Diprolene)  Carvedilol (Coreg)  Cholecalciferol  Clindamycin (Cleocin)  Digoxin (Lanoxin)  Fluticasone (Flonase)  Furosemide (Lasix)  Levothyroxine   Lisinopril  Meloxicam (Mobic)  Metronidazole (Flagyl)  Naloxone (Narcan)  Olopatadine (patanol)  Topical (Lidex)  Ustekinumab  Venlafaxine (Effexor)  Vitamin C ==================================================================== For clinical consultation, please call [phone]. ====================================================================    UDS interpretation: Compliant          Medication Assessment Form: Reviewed. Patient indicates being compliant with therapy Treatment compliance: Compliant Risk Assessment Profile: Aberrant behavior: See prior evaluations. None observed or detected today Comorbid factors increasing risk of overdose: See prior notes. No additional risks detected today Risk of substance use disorder (SUD): Low Opioid risk tool (ORT) (Total Score): 0     Opioid Risk Tool - 04/11/17 0953      Family History of Substance Abuse   Alcohol Negative   Illegal Drugs Negative   Rx Drugs Negative     Personal History of Substance Abuse   Alcohol Negative   Illegal Drugs Negative   Rx Drugs Negative     Age   Age between 36-45 years  No     History of Preadolescent Sexual Abuse   History of Preadolescent Sexual Abuse Negative or Female     Psychological Disease   Psychological Disease Negative   Depression Negative     Total Score   Opioid Risk Tool Scoring 0   Opioid Risk Interpretation Low Risk     ORT Scoring interpretation table:  Score <3 = Low Risk for SUD  Score between 4-7 = Moderate Risk for SUD  Score >8 = High Risk for Opioid Abuse   Risk Mitigation Strategies:  Patient Counseling: Covered Patient-Prescriber Agreement (PPA): Present and active  Notification to other healthcare providers: Done  Pharmacologic Plan: No change in therapy, at this time  Laboratory Chemistry  Inflammation Markers (  CRP: Acute Phase) (ESR: Chronic Phase) No results found for: CRP, ESRSEDRATE               Renal Function Markers Lab Results   Component Value Date   BUN 10 03/10/2016   CREATININE 0.77 03/10/2016   GFRAA >89 03/10/2016   GFRNONAA >89 03/10/2016                 Hepatic Function Markers Lab Results  Component Value Date   AST 17 03/10/2016   ALT 11 03/10/2016   ALBUMIN 3.8 03/10/2016   ALKPHOS 65 03/10/2016                 Electrolytes Lab Results  Component Value Date   NA 136 03/10/2016   K 3.5 03/10/2016   CL 104 03/10/2016   CALCIUM 8.6 03/10/2016                 Neuropathy Markers No results found for: [id number]               Bone Pathology Markers Lab Results  Component Value Date   ALKPHOS 65 03/10/2016   CALCIUM 8.6 03/10/2016                 Coagulation Parameters Lab Results  Component Value Date   PLT 222 09/09/2012                 Cardiovascular Markers Lab Results  Component Value Date   BNP 379 (H) 09/09/2012   HGB 12.2 04/30/2013   HCT 36.3 04/30/2013                 Note: Lab results reviewed.  Recent Diagnostic Imaging Review  No results found. Note: Imaging results reviewed.          Meds   Current Meds  Medication Sig  . Ascorbic Acid (VITAMIN C) 1000 MG tablet Take 1,000 mg by mouth daily.  . betamethasone dipropionate (DIPROLENE) 0.05 % ointment daily.   . carvedilol (COREG) 25 MG tablet Take 1 tablet (25 mg total) by mouth 2 (two) times daily with a meal.  . Cholecalciferol (VITAMIN D-1000 MAX ST) 1000 UNITS tablet Take by mouth daily.   . clobetasol (TEMOVATE) 0.05 % external solution Apply 1 application topically as needed.   . digoxin (LANOXIN) 0.25 MG tablet Take 1 tablet (0.25 mg total) by mouth daily.  . fluocinonide (LIDEX) 0.05 % external solution Apply 1 application topically daily.   . fluticasone (FLONASE) 50 MCG/ACT nasal spray Place 2 sprays into both nostrils daily.  . furosemide (LASIX) 40 MG tablet Take 40 mg by mouth daily as needed. Reported on 01/13/2016  . ipratropium (ATROVENT) 0.06 % nasal spray Place 2 sprays into both nostrils  4 (four) times daily. For up to 5-7 days then stop.  . Ixekizumab (TALTZ) 80 MG/ML SOSY Inject 80 mg into the skin as directed.  Marland Kitchen levothyroxine (SYNTHROID, LEVOTHROID) 125 MCG tablet Take 1 tablet (125 mcg total) by mouth daily.  Marland Kitchen lisinopril (PRINIVIL,ZESTRIL) 5 MG tablet Take 1 tablet (5 mg total) by mouth daily.  . meloxicam (MOBIC) 7.5 MG tablet Take by mouth daily.   . naloxone (NARCAN) nasal spray 4 mg/0.1 mL To reverse excess sedation from narcotics  . [START ON 05/21/2017] oxyCODONE 30 MG 12 hr tablet Take 30 mg by mouth every 12 (twelve) hours.  Marland Kitchen TALTZ 80 MG/ML SOAJ   . venlafaxine XR (EFFEXOR-XR) 75 MG 24 hr capsule TAKE ONE CAPSULE  BY MOUTH ONCE DAILY WITH BREAKFAST  . [DISCONTINUED] oxyCODONE 30 MG 12 hr tablet Take 30 mg by mouth every 12 (twelve) hours.  . [DISCONTINUED] oxyCODONE 30 MG 12 hr tablet Take 30 mg by mouth every 12 (twelve) hours.  . [DISCONTINUED] oxyCODONE 30 MG 12 hr tablet Take 30 mg by mouth every 12 (twelve) hours.    ROS  Constitutional: Denies any fever or chills Gastrointestinal: No reported hemesis, hematochezia, vomiting, or acute GI distress Musculoskeletal: Denies any acute onset joint swelling, redness, loss of ROM, or weakness Neurological: No reported episodes of acute onset apraxia, aphasia, dysarthria, agnosia, amnesia, paralysis, loss of coordination, or loss of consciousness  Allergies  Krista Kennedy is allergic to sulfa antibiotics and sulfacetamide sodium.  PFSH  Drug: Krista Kennedy  reports that Krista Kennedy does not use drugs. Alcohol:  reports that Krista Kennedy does not drink alcohol. Tobacco:  reports that Krista Kennedy has quit smoking. Her smoking use included Cigarettes. Krista Kennedy has a 3.75 pack-year smoking history. Krista Kennedy has quit using smokeless tobacco. Medical:  has a past medical history of Cardiomyopathy [facility]); CHF (congestive heart failure) ([place]); Hyperlipidemia; Hypertension; Psoriasis; and Thyroid disease. Surgical: Krista Kennedy  has a past surgical history that  includes Thyroidectomy; Tonsillectomy; Tubal ligation; and Endometrial ablation (2015). Family: family history includes Heart disease in her father; Hyperlipidemia in her father and mother; Hypertension in her father and mother.  Constitutional Exam  General appearance: Well nourished, well developed, and well hydrated. In no apparent acute distress Vitals:   04/11/17 0945  BP: (!) 162/84  Pulse: 77  Resp: 16  Temp: 98.1 F (36.7 C)  SpO2: 99%  Weight: 160 lb (72.6 kg)  Height: '5\' 10"'$  (1.778 m)   BMI Assessment: Estimated body mass index is 22.96 kg/m as calculated from the following:   Height as of this encounter: '5\' 10"'$  (1.778 m).   Weight as of this encounter: 160 lb (72.6 kg).  BMI interpretation table: BMI level Category Range association with higher incidence of chronic pain  <18 kg/m2 Underweight   18.5-24.9 kg/m2 Ideal body weight   25-29.9 kg/m2 Overweight Increased incidence by 20%  30-34.9 kg/m2 Obese (Class I) Increased incidence by 68%  35-39.9 kg/m2 Severe obesity (Class II) Increased incidence by 136%  >40 kg/m2 Extreme obesity (Class III) Increased incidence by 254%   BMI Readings from Last 4 Encounters:  04/11/17 22.96 kg/m  03/15/17 24.54 kg/m  02/06/17 22.96 kg/m  02/02/17 22.96 kg/m   Wt Readings from Last 4 Encounters:  04/11/17 160 lb (72.6 kg)  03/15/17 171 lb (77.6 kg)  02/06/17 160 lb (72.6 kg)  02/02/17 160 lb (72.6 kg)  Psych/Mental status: Alert, oriented x 3 (person, place, & time)       Eyes: PERLA Respiratory: No evidence of acute respiratory distress  Cervical Spine Area Exam  Skin & Axial Inspection: No masses, redness, edema, swelling, or associated skin lesions Alignment: Symmetrical Functional ROM: Unrestricted ROM      Stability: No instability detected Muscle Tone/Strength: Functionally intact. No obvious neuro-muscular anomalies detected. Sensory (Neurological): Unimpaired Palpation: No palpable anomalies               Upper Extremity (UE) Exam    Side: Right upper extremity  Side: Left upper extremity  Skin & Extremity Inspection: Skin color, temperature, and hair growth are WNL. No peripheral edema or cyanosis. No masses, redness, swelling, asymmetry, or associated skin lesions. No contractures.  Skin & Extremity Inspection: Skin color, temperature, and hair growth are  WNL. No peripheral edema or cyanosis. No masses, redness, swelling, asymmetry, or associated skin lesions. No contractures.  Functional ROM: Unrestricted ROM          Functional ROM: Unrestricted ROM          Muscle Tone/Strength: Functionally intact. No obvious neuro-muscular anomalies detected.  Muscle Tone/Strength: Functionally intact. No obvious neuro-muscular anomalies detected.  Sensory (Neurological): Unimpaired  Sensory (Neurological): Unimpaired  Palpation: No palpable anomalies              Palpation: No palpable anomalies              Specialized Test(s): Phalen's (-)  Specialized Test(s): Phalen's (-)   Thoracic Spine Area Exam  Skin & Axial Inspection: No masses, redness, or swelling Alignment: Symmetrical Functional ROM: Unrestricted ROM Stability: No instability detected Muscle Tone/Strength: Functionally intact. No obvious neuro-muscular anomalies detected. Sensory (Neurological): Unimpaired Muscle strength & Tone: No palpable anomalies  Lumbar Spine Area Exam  Skin & Axial Inspection: No masses, redness, or swelling Alignment: Symmetrical Functional ROM: Unrestricted ROM      Stability: No instability detected Muscle Tone/Strength: Functionally intact. No obvious neuro-muscular anomalies detected. Sensory (Neurological): Unimpaired Palpation: No palpable anomalies       Provocative Tests: Lumbar Hyperextension and rotation test: evaluation deferred today       Lumbar Lateral bending test: evaluation deferred today       Patrick's Maneuver: Positive for bilateral S-I arthralgia              Gait & Posture  Assessment  Ambulation: Unassisted Gait: Relatively normal for age and body habitus Posture: WNL   Lower Extremity Exam    Side: Right lower extremity  Side: Left lower extremity  Skin & Extremity Inspection: Skin color, temperature, and hair growth are WNL. No peripheral edema or cyanosis. No masses, redness, swelling, asymmetry, or associated skin lesions. No contractures.  Skin & Extremity Inspection: Skin color, temperature, and hair growth are WNL. No peripheral edema or cyanosis. No masses, redness, swelling, asymmetry, or associated skin lesions. No contractures.  Functional ROM: Unrestricted ROM          Functional ROM: Unrestricted ROM          Muscle Tone/Strength: Functionally intact. No obvious neuro-muscular anomalies detected.  Muscle Tone/Strength: Functionally intact. No obvious neuro-muscular anomalies detected.  Sensory (Neurological): Unimpaired  Sensory (Neurological): Unimpaired  Palpation: No palpable anomalies  Palpation: No palpable anomalies   Assessment  Primary Diagnosis & Pertinent Problem List: The primary encounter diagnosis was Hip pain, bilateral. Diagnoses of Left wrist pain, Sacroiliac joint pain, and Chronic pain syndrome were also pertinent to this visit.  Status Diagnosis  Persistent Worsening Persistent 1. Hip pain, bilateral   2. Left wrist pain   3. Sacroiliac joint pain   4. Chronic pain syndrome     Problems updated and reviewed during this visit: Problem  Left Wrist Pain  Sacroiliac Joint Pain   Plan of Care  Pharmacotherapy (Medications Ordered): Meds ordered this encounter  Medications  . DISCONTD: oxyCODONE 30 MG 12 hr tablet    Sig: Take 30 mg by mouth every 12 (twelve) hours.    Dispense:  60 each    Refill:  0    Do not place this medication, or any other prescription from our practice, on "Automatic Refill". Patient may have prescription filled one day early if pharmacy is closed on scheduled refill date. Do not fill until:  04/21/2017 To last until:05/21/2017    Order  Specific Question:   Supervising Provider    Answer:   Yevette Edwards [[id number]]  . DISCONTD: oxyCODONE-acetaminophen (PERCOCET) 10-325 MG tablet    Sig: Take 1 tablet by mouth every 8 (eight) hours as needed for pain.    Dispense:  90 tablet    Refill:  0    Do not place this medication, or any other prescription from our practice, on "Automatic Refill". Patient may have prescription filled one day early if pharmacy is closed on scheduled refill date. Do not fill until: 04/11/2017 To last until: 05/11/2017    Order Specific Question:   Supervising Provider    Answer:   Yevette Edwards [[id number]]  . oxyCODONE 30 MG 12 hr tablet    Sig: Take 30 mg by mouth every 12 (twelve) hours.    Dispense:  60 each    Refill:  0    Do not place this medication, or any other prescription from our practice, on "Automatic Refill". Patient may have prescription filled one day early if pharmacy is closed on scheduled refill date. Do not fill until:05/21/2017 To last until:06/20/2017    Order Specific Question:   Supervising Provider    Answer:   Yevette Edwards [[id number]]  . oxyCODONE-acetaminophen (PERCOCET) 10-325 MG tablet    Sig: Take 1 tablet by mouth every 8 (eight) hours as needed for pain.    Dispense:  90 tablet    Refill:  0    Do not place this medication, or any other prescription from our practice, on "Automatic Refill". Patient may have prescription filled one day early if pharmacy is closed on scheduled refill date. Do not fill until:05/11/2017 To last until:06/10/2017    Order Specific Question:   Supervising Provider    Answer:   [facility]   New Prescriptions   No medications on file   Medications administered today: Krista Kennedy had no medications administered during this visit. Lab-work, procedure(s), and/or referral(s): Orders Placed This Encounter  Procedures  . DG HIP UNILAT W OR W/O PELVIS 2-3 VIEWS RIGHT  . DG HIP UNILAT W OR W/O PELVIS  2-3 VIEWS LEFT  . DG Si Joints  . DG Wrist 2 Views Left   Imaging and/or referral(s): DG HIP UNILAT W OR W/O PELVIS 2-3 VIEWS RIGHT DG HIP UNILAT W OR W/O PELVIS 2-3 VIEWS LEFT DG SI JOINTS DG WRIST 2 VIEWS LEFT  Interventional therapies: Planned, scheduled, and/or pending:   Not at this time.   Considering:   Not at this time   Palliative PRN treatment(s):   Not at this time   Provider-requested follow-up: Return in about 2 months (around 06/11/2017) for MedMgmt.  Future Appointments Date Time Provider Department Center  06/12/2017 9:00 AM Barbette Merino, NP [facility] None   Primary Care Physician: Smitty Cords, DO Location: [facility] Note by: Barbette Merino NP Date: 04/11/2017; Time: 3:44 PM  Pain Score Disclaimer: We use the NRS-11 scale. This is a self-reported, subjective measurement of pain severity with only modest accuracy. It is used primarily to identify changes within a particular patient. It must be understood that outpatient pain scales are significantly less accurate that those used for research, where they can be applied under ideal controlled circumstances with minimal exposure to variables. In reality, the score is likely to be a combination of pain intensity and pain affect, where pain affect describes the degree of emotional arousal or changes in action readiness caused by  the sensory experience of pain. Factors such as social and work situation, setting, emotional state, anxiety levels, expectation, and prior pain experience may influence pain perception and show large inter-individual differences that may also be affected by time variables.  Patient instructions provided during this appointment: Patient Instructions   ____________________________________________________________________________________________  Medication Rules  Applies to: All patients receiving prescriptions (written or electronic).  Pharmacy of  record: Pharmacy where electronic prescriptions will be sent. If written prescriptions are taken to a different pharmacy, please inform the nursing staff. The pharmacy listed in the electronic medical record should be the one where you would like electronic prescriptions to be sent.  Prescription refills: Only during scheduled appointments. Applies to both, written and electronic prescriptions.  NOTE: The following applies primarily to controlled substances (Opioid* Pain Medications).   Patient's responsibilities: 1. Pain Pills: Bring all pain pills to every appointment (except for procedure appointments). 2. Pill Bottles: Bring pills in original pharmacy bottle. Always bring newest bottle. Bring bottle, even if empty. 3. Medication refills: You are responsible for knowing and keeping track of what medications you need refilled. The day before your appointment, write a list of all prescriptions that need to be refilled. Bring that list to your appointment and give it to the admitting nurse. Prescriptions will be written only during appointments. If you forget a medication, it will not be "Called in", "Faxed", or "electronically sent". You will need to get another appointment to get these prescribed. 4. Prescription Accuracy: You are responsible for carefully inspecting your prescriptions before leaving our office. Have the discharge nurse carefully go over each prescription with you, before taking them home. Make sure that your name is accurately spelled, that your address is correct. Check the name and dose of your medication to make sure it is accurate. Check the number of pills, and the written instructions to make sure they are clear and accurate. Make sure that you are given enough medication to last until your next medication refill appointment. 5. Taking Medication: Take medication as prescribed. Never take more pills than instructed. Never take medication more frequently than prescribed. Taking  less pills or less frequently is permitted and encouraged, when it comes to controlled substances (written prescriptions).  6. Inform other Doctors: Always inform, all of your healthcare providers, of all the medications you take. 7. Pain Medication from other Providers: You are not allowed to accept any additional pain medication from any other Doctor or Healthcare provider. There are two exceptions to this rule. (see below) In the event that you require additional pain medication, you are responsible for notifying us, as stated below. 8. Medication Agreement: You are responsible for carefully reading and following our Medication Agreement. This must be signed before receiving any prescriptions from our practice. Safely store a copy of your signed Agreement. Violations to the Agreement will result in no further prescriptions. (Additional copies of our Medication Agreement are available upon request.) 9. Laws, Rules, & Regulations: All patients are expected to follow all [address] and [employer], Rules, [place] [employer]. Ignorance of the Laws does not constitute a valid excuse. The use of any illegal substances is prohibited. 10. Adopted CDC guidelines & recommendations: Target dosing levels will be at or below 60 MME/day. Use of benzodiazepines** is not recommended.  Exceptions: There are only two exceptions to the rule of not receiving pain medications from other Healthcare Providers. 1. Exception #1 (Emergencies): In the event of an emergency (i.e.: accident requiring emergency care), you are allowed to  receive additional pain medication. However, you are responsible for: As soon as you are able, call our office [phone], at any time of the day or night, and leave a message stating your name, the date and nature of the emergency, and the name and dose of the medication prescribed. In the event that your call is answered by a member of our staff, make sure to document and save the date, time,  and the name of the person that took your information.  2. Exception #2 (Planned Surgery): In the event that you are scheduled by another doctor or dentist to have any type of surgery or procedure, you are allowed (for a period no longer than 30 days), to receive additional pain medication, for the acute post-op pain. However, in this case, you are responsible for picking up a copy of our "Post-op Pain Management for Surgeons" handout, and giving it to your surgeon or dentist. This document is available at our office, and does not require an appointment to obtain it. Simply go to our office during business hours (Monday-Thursday from 8:00 AM to 4:00 PM) (Friday 8:00 AM to 12:00 Noon) or if you have a scheduled appointment with [place], prior to your surgery, and ask for it by name. In addition, you will need to provide [place] with your name, name of your surgeon, type of surgery, and date of procedure or surgery.  *Opioid medications include: morphine, codeine, oxycodone, oxymorphone, hydrocodone, hydromorphone, meperidine, tramadol, tapentadol, buprenorphine, fentanyl, methadone. **Benzodiazepine medications include: diazepam (Valium), alprazolam (Xanax), clonazepam (Klonopine), lorazepam (Ativan), clorazepate (Tranxene), chlordiazepoxide (Librium), estazolam (Prosom), oxazepam (Serax), temazepam (Restoril), triazolam (Halcion)  ____________________________________________________________________________________________

## 2017-04-11 NOTE — Progress Notes (Signed)
Nursing Pain Medication Assessment:  Safety precautions to be maintained throughout the outpatient stay will include: orient to surroundings, keep bed in low position, maintain call bell within reach at all times, provide assistance with transfer out of bed and ambulation.  Medication Inspection Compliance: Pill count conducted under aseptic conditions, in front of the patient. Neither the pills nor the bottle was removed from the patient's sight at any time. Once count was completed pills were immediately returned to the patient in their original bottle.  Medication: Oxycodone ER (OxyContin) Pill/Patch Count: 28 of 60 pills remain Pill/Patch Appearance: Markings consistent with prescribed medication Bottle Appearance: Standard pharmacy container. Clearly labeled. Filled Date: 07 / 22 / 2018 Last Medication intake:  Today

## 2017-04-12 ENCOUNTER — Encounter: Payer: Self-pay | Admitting: Family Medicine

## 2017-04-12 LAB — COMPLETE METABOLIC PANEL WITH GFR
ALBUMIN: 4.1 g/dL (ref 3.6–5.1)
ALK PHOS: 71 U/L (ref 33–130)
ALT: 13 U/L (ref 6–29)
AST: 17 U/L (ref 10–35)
BUN: 12 mg/dL (ref 7–25)
CO2: 26 mmol/L (ref 20–32)
Calcium: 9.1 mg/dL (ref 8.6–10.4)
Chloride: 104 mmol/L (ref 98–110)
Creat: 0.85 mg/dL (ref 0.50–1.05)
GFR, Est African American: >89 mL/min (ref >=60)
GFR, Est Non African American: 80 mL/min (ref >=60)
GLUCOSE: 104 mg/dL — AB (ref 65–99)
POTASSIUM: 3.9 mmol/L (ref 3.5–5.3)
Sodium: 139 mmol/L (ref 135–146)
Total Bilirubin: 1 mg/dL (ref 0.2–1.2)
Total Protein: 7 g/dL (ref 6.1–8.1)

## 2017-04-12 LAB — LIPID PANEL
CHOL/HDL RATIO: 3 ratio (ref <5.0)
Cholesterol: 223 mg/dL — ABNORMAL HIGH (ref <200)
HDL: 74 mg/dL (ref >50)
LDL CALC: 129 mg/dL — AB (ref <100)
Triglycerides: 98 mg/dL (ref <150)
VLDL: 20 mg/dL (ref <30)

## 2017-04-12 LAB — T4, FREE: Free T4: 0.9 ng/dL (ref 0.8–1.8)

## 2017-04-12 LAB — DIGOXIN LEVEL: Digoxin Level: <0.5 ug/L — ABNORMAL LOW (ref 0.8–2.0)

## 2017-04-12 LAB — TSH: TSH: 1.57 m[IU]/L

## 2017-04-12 LAB — HEMOGLOBIN A1C
Hgb A1c MFr Bld: 5.9 % — ABNORMAL HIGH
Mean Plasma Glucose: 123 mg/dL

## 2017-04-12 MED ORDER — LEVOTHYROXINE SODIUM 125 MCG PO TABS: 125.0000 ug | ORAL_TABLET | Freq: Every day | ORAL | 3 refills | Status: DC

## 2017-04-12 MED ORDER — DIGOXIN 250 MCG PO TABS: 0.2500 mg | ORAL_TABLET | Freq: Every day | ORAL | 3 refills | Status: DC

## 2017-04-12 MED ORDER — LISINOPRIL 5 MG PO TABS: 5.0000 mg | ORAL_TABLET | Freq: Every day | ORAL | 3 refills | Status: DC

## 2017-04-12 MED ORDER — CARVEDILOL 25 MG PO TABS: 25.0000 mg | ORAL_TABLET | Freq: Two times a day (BID) | ORAL | 3 refills | Status: DC

## 2017-04-26 ENCOUNTER — Telehealth: Payer: Self-pay | Admitting: *Deleted

## 2017-04-26 NOTE — Telephone Encounter (Signed)
Needs PA for Oxycodone.

## 2017-04-26 NOTE — Telephone Encounter (Signed)
PA for Oxycodone sent.

## 2017-05-02 ENCOUNTER — Telehealth: Payer: Self-pay | Admitting: Anesthesiology

## 2017-05-02 NOTE — Telephone Encounter (Addendum)
Needs to get prior auth for her percocet, she will need to refill in couple days. Please call patient and let her know

## 2017-05-02 NOTE — Telephone Encounter (Signed)
Patient notified that Percocet approved. Patient concerned that Oxycodone was denied. Wants something long acting to take its place. Call sent to secretary to schedule appt with [place].

## 2017-05-02 NOTE — Telephone Encounter (Signed)
Will sent PA for Percocet. Attempted to notify patient, mailbox is full, unable to leave a message.

## 2017-05-03 ENCOUNTER — Ambulatory Visit: Payer: Commercial Managed Care - PPO | Admitting: Nurse Practitioner

## 2017-05-10 ENCOUNTER — Telehealth: Payer: Self-pay | Admitting: *Deleted

## 2017-05-10 NOTE — Telephone Encounter (Signed)
Left voicemail with patient.

## 2017-05-10 NOTE — Telephone Encounter (Signed)
Patient needs for oxycodone 30 mg which was denied and the insurance company states she needs additional information. Patient will call to let us know if there is a "key" on her letter.

## 2017-05-11 NOTE — Telephone Encounter (Signed)
Called pharmacy re; PA and they have nothing on file about denial and last fill for oxycodone 30 mg was 04/26/17 which did go through her insurance.

## 2017-05-22 ENCOUNTER — Telehealth: Payer: Self-pay | Admitting: *Deleted

## 2017-05-22 NOTE — Telephone Encounter (Signed)
Attempted to call patient to notify her of Oxycodone denial. Message left.

## 2017-05-24 ENCOUNTER — Ambulatory Visit: Payer: Commercial Managed Care - PPO | Attending: Nurse Practitioner | Admitting: Nurse Practitioner

## 2017-05-24 ENCOUNTER — Encounter: Payer: Self-pay | Admitting: Nurse Practitioner

## 2017-05-24 VITALS — BP 132/79 | HR 80 | Temp 98.1°F | Resp 16 | Ht 70.0 in | Wt 160.0 lb

## 2017-05-24 DIAGNOSIS — M25551 Pain in right hip: Secondary | ICD-10-CM | POA: Diagnosis not present

## 2017-05-24 DIAGNOSIS — E785 Hyperlipidemia, unspecified: Secondary | ICD-10-CM | POA: Insufficient documentation

## 2017-05-24 DIAGNOSIS — M25532 Pain in left wrist: Secondary | ICD-10-CM | POA: Insufficient documentation

## 2017-05-24 DIAGNOSIS — E039 Hypothyroidism, unspecified: Secondary | ICD-10-CM | POA: Diagnosis not present

## 2017-05-24 DIAGNOSIS — I5022 Chronic systolic (congestive) heart failure: Secondary | ICD-10-CM | POA: Insufficient documentation

## 2017-05-24 DIAGNOSIS — G894 Chronic pain syndrome: Secondary | ICD-10-CM | POA: Diagnosis present

## 2017-05-24 DIAGNOSIS — G4700 Insomnia, unspecified: Secondary | ICD-10-CM | POA: Insufficient documentation

## 2017-05-24 DIAGNOSIS — Z5181 Encounter for therapeutic drug level monitoring: Secondary | ICD-10-CM | POA: Insufficient documentation

## 2017-05-24 DIAGNOSIS — M533 Sacrococcygeal disorders, not elsewhere classified: Secondary | ICD-10-CM | POA: Insufficient documentation

## 2017-05-24 DIAGNOSIS — I110 Hypertensive heart disease with heart failure: Secondary | ICD-10-CM | POA: Diagnosis not present

## 2017-05-24 DIAGNOSIS — I429 Cardiomyopathy, unspecified: Secondary | ICD-10-CM | POA: Insufficient documentation

## 2017-05-24 DIAGNOSIS — G8929 Other chronic pain: Secondary | ICD-10-CM | POA: Diagnosis not present

## 2017-05-24 DIAGNOSIS — R739 Hyperglycemia, unspecified: Secondary | ICD-10-CM | POA: Diagnosis not present

## 2017-05-24 DIAGNOSIS — Z87891 Personal history of nicotine dependence: Secondary | ICD-10-CM | POA: Diagnosis not present

## 2017-05-24 DIAGNOSIS — Z882 Allergy status to sulfonamides status: Secondary | ICD-10-CM | POA: Diagnosis not present

## 2017-05-24 DIAGNOSIS — Z79899 Other long term (current) drug therapy: Secondary | ICD-10-CM | POA: Insufficient documentation

## 2017-05-24 DIAGNOSIS — Z79891 Long term (current) use of opiate analgesic: Secondary | ICD-10-CM | POA: Insufficient documentation

## 2017-05-24 DIAGNOSIS — M5441 Lumbago with sciatica, right side: Secondary | ICD-10-CM | POA: Diagnosis not present

## 2017-05-24 DIAGNOSIS — F329 Major depressive disorder, single episode, unspecified: Secondary | ICD-10-CM | POA: Insufficient documentation

## 2017-05-24 DIAGNOSIS — M5442 Lumbago with sciatica, left side: Secondary | ICD-10-CM | POA: Diagnosis not present

## 2017-05-24 DIAGNOSIS — K219 Gastro-esophageal reflux disease without esophagitis: Secondary | ICD-10-CM | POA: Insufficient documentation

## 2017-05-24 DIAGNOSIS — M25552 Pain in left hip: Secondary | ICD-10-CM | POA: Diagnosis not present

## 2017-05-24 MED ORDER — MORPHINE-NALTREXONE 20-0.8 MG PO CPCR: 1.0000 | ORAL_CAPSULE | Freq: Two times a day (BID) | ORAL | 0 refills | Status: DC

## 2017-05-24 NOTE — Progress Notes (Deleted)
Patient's Name: Krista Kennedy  MRN: [id number]  Referring Provider: Nobie Putnam *  DOB: Mar 19, 1966  PCP: Olin Hauser, DO  DOS: 05/24/2017  Note by: Vevelyn Francois NP  Service setting: Ambulatory outpatient  Specialty: Interventional Pain Management  Location: [facility] (AMB) Pain Management Facility    Patient type: Established    Primary Reason(s) for Visit: Encounter for prescription drug management. (Level of risk: moderate)  CC: Back Pain (hips )  HPI  Krista Kennedy is a 51 y.o. year old, female patient, who comes today for a medication management evaluation. She has Essential hypertension; Psoriasis; Chronic systolic heart failure ([place]); Clinical depression; Dysmenorrhea; Fibroid; Acid reflux; Adult hypothyroidism; Low back pain; Excess, menstruation; Polypharmacy; Psoriasis with arthropathy ([place]); Avitaminosis D; Opioid dependence, daily use ([place]); Allergic rhinitis due to pollen; Other long term (current) drug therapy; Chronic pain syndrome; Hip pain, bilateral; Pain in multiple finger joints; Therapeutic opioid induced constipation; Hyperlipidemia; Elevated hemoglobin A1c; Left wrist pain; and Sacroiliac joint pain on her problem list. Her primarily concern today is the Back Pain (hips )  Pain Assessment: Location:   Back Radiating: buttocks down side of leg to foot Onset: More than a month ago Duration: Chronic pain Quality: Spasm, Aching Severity: 4 /10 (self-reported pain score)  Note: Reported level is compatible with observation.                   Effect on ADL: pace self Timing: Constant Modifying factors: medication  Krista Kennedy was last scheduled for an appointment on 04/11/2017 for medication management. During today's appointment we reviewed Krista Kennedy's chronic pain status, as well as her outpatient medication regimen.  The patient  reports that she does not use drugs. Her body mass index is 22.96 kg/m.  Further details on both, my assessment(s), as  well as the proposed treatment plan, please see below.  Controlled Substance Pharmacotherapy Assessment REMS (Risk Evaluation and Mitigation Strategy)  Analgesic:Oxycodone ER 30 mg tablets twice daily, oxycodone/acetaminophen mg 10/325 3 times daily (oxycodone 90 mg per day) MME/day:'135mg'$ /day.  Ignatius Specking, RN  05/24/2017  2:45 PM  Sign at close encounter Nursing Pain Medication Assessment:  Safety precautions to be maintained throughout the outpatient stay will include: orient to surroundings, keep bed in low position, maintain call bell within reach at all times, provide assistance with transfer out of bed and ambulation.  Medication Inspection Compliance: Pill count conducted under aseptic conditions, in front of the patient. Neither the pills nor the bottle was removed from the patient's sight at any time. Once count was completed pills were immediately returned to the patient in their original bottle.  Medication: See above Pill/Patch Count: 6 of 60 pills remain Pill/Patch Appearance: Markings consistent with prescribed medication Bottle Appearance: Standard pharmacy container. Clearly labeled. Filled Date: 08 / 22 / 2018 Last Medication intake:  Today Pharmacokinetics: Liberation and absorption (onset of action): WNL Distribution (time to peak effect): WNL Metabolism and excretion (duration of action): WNL         Pharmacodynamics: Desired effects: Analgesia: Krista Kennedy reports >50% benefit. Functional ability: Patient reports that medication allows her to accomplish basic ADLs Clinically meaningful improvement in function (CMIF): Sustained CMIF goals met Perceived effectiveness: Described as relatively effective, allowing for increase in activities of daily living (ADL) Undesirable effects: Side-effects or Adverse reactions: None reported Monitoring: [place] PMP: Online review of the past 38-monthperiod conducted. Compliant with practice rules and regulations List of all  UDS test(s) done:  Lab Results  Component Value Date   TOXASSSELUR FINAL 06/27/2016   TOXASSSELUR FINAL 11/04/2015   TOXASSSELUR FINAL 10/05/2015   TOXASSSELUR FINAL 09/08/2015   SUMMARY FINAL 12/08/2016   Last UDS on record: ToxAssure Select 13  Date Value Ref Range Status  06/27/2016 FINAL  Final    Comment:    ==================================================================== TOXASSURE SELECT 13 (MW) ==================================================================== Test                             Result       Flag       Units Drug Present and Declared for Prescription Verification   Oxycodone                      >3704        EXPECTED   ng/mg creat   Oxymorphone                    3554         EXPECTED   ng/mg creat   Noroxycodone                   >3704        EXPECTED   ng/mg creat   Noroxymorphone                 872          EXPECTED   ng/mg creat    Sources of oxycodone are scheduled prescription medications.    Oxymorphone, noroxycodone, and noroxymorphone are expected    metabolites of oxycodone. Oxymorphone is also available as a    scheduled prescription medication. ==================================================================== Test                      Result    Flag   Units      Ref Range   Creatinine              270              mg/dL      >=20 ==================================================================== Declared Medications:  The flagging and interpretation on this report are based on the  following declared medications.  Unexpected results may arise from  inaccuracies in the declared medications.  **Note: The testing scope of this panel includes these medications:  Oxycodone  Oxycodone (Percocet)  **Note: The testing scope of this panel does not include following  reported medications:  Acetaminophen (Percocet)  Betamethasone (Diprolene)  Carvedilol (Coreg)  Cholecalciferol  Clindamycin (Cleocin)  Digoxin  Fluticasone (Flonase)   Furosemide (Lasix)  Levothyroxine  Lisinopril  Meloxicam (Mobic)  Metronidazole (Flagyl)  Olopatadine (patanol)  Topical  Topical (Lidex)  Venlafaxine  Vitamin C ==================================================================== For clinical consultation, please call [phone]. ====================================================================    Summary  Date Value Ref Range Status  12/08/2016 FINAL  Final    Comment:    ==================================================================== TOXASSURE SELECT 13 (MW) ==================================================================== Test                             Result       Flag       Units Drug Present and Declared for Prescription Verification   Oxycodone                      6012         EXPECTED   ng/mg creat  Oxymorphone                    4933         EXPECTED   ng/mg creat   Noroxycodone                   >6536        EXPECTED   ng/mg creat   Noroxymorphone                 1644         EXPECTED   ng/mg creat    Sources of oxycodone are scheduled prescription medications.    Oxymorphone, noroxycodone, and noroxymorphone are expected    metabolites of oxycodone. Oxymorphone is also available as a    scheduled prescription medication. ==================================================================== Test                      Result    Flag   Units      Ref Range   Creatinine              153              mg/dL      >=20 ==================================================================== Declared Medications:  The flagging and interpretation on this report are based on the  following declared medications.  Unexpected results may arise from  inaccuracies in the declared medications.  **Note: The testing scope of this panel includes these medications:  Oxycodone  Oxycodone (Percocet)  **Note: The testing scope of this panel does not include following  reported medications:  Acetaminophen (Percocet)   Betamethasone (Diprolene)  Carvedilol (Coreg)  Cholecalciferol  Clindamycin (Cleocin)  Digoxin (Lanoxin)  Fluticasone (Flonase)  Furosemide (Lasix)  Levothyroxine  Lisinopril  Meloxicam (Mobic)  Metronidazole (Flagyl)  Naloxone (Narcan)  Olopatadine (patanol)  Topical (Lidex)  Ustekinumab  Venlafaxine (Effexor)  Vitamin C ==================================================================== For clinical consultation, please call [phone]. ====================================================================    UDS interpretation: Compliant          Medication Assessment Form: Reviewed. Patient indicates being compliant with therapy Treatment compliance: Compliant Risk Assessment Profile: Aberrant behavior: See prior evaluations. None observed or detected today Comorbid factors increasing risk of overdose: See prior notes. No additional risks detected today Risk of substance use disorder (SUD): Low     Opioid Risk Tool - 05/24/17 1443      Family History of Substance Abuse   Alcohol Negative   Illegal Drugs Negative   Rx Drugs Negative     Personal History of Substance Abuse   Alcohol Negative   Illegal Drugs Negative   Rx Drugs Negative     Age   Age between 36-45 years  No     History of Preadolescent Sexual Abuse   History of Preadolescent Sexual Abuse Negative or Female     Psychological Disease   Psychological Disease Negative   Depression Negative     Total Score   Opioid Risk Tool Scoring 0   Opioid Risk Interpretation Low Risk     ORT Scoring interpretation table:  Score <3 = Low Risk for SUD  Score between 4-7 = Moderate Risk for SUD  Score >8 = High Risk for Opioid Abuse   Risk Mitigation Strategies:  Patient Counseling: Covered Patient-Prescriber Agreement (PPA): Present and active  Notification to other healthcare providers: Done  Pharmacologic Plan: No change in therapy, at this time  Laboratory Chemistry  Inflammation  Markers (CRP: Acute Phase) (ESR: Chronic Phase)  No results found for: CRP, ESRSEDRATE               Renal Function Markers Lab Results  Component Value Date   BUN 12 04/11/2017   CREATININE 0.85 04/11/2017   GFRAA >89 04/11/2017   GFRNONAA 80 04/11/2017                 Hepatic Function Markers Lab Results  Component Value Date   AST 17 04/11/2017   ALT 13 04/11/2017   ALBUMIN 4.1 04/11/2017   ALKPHOS 71 04/11/2017                 Electrolytes Lab Results  Component Value Date   NA 139 04/11/2017   K 3.9 04/11/2017   CL 104 04/11/2017   CALCIUM 9.1 04/11/2017                 Neuropathy Markers No results found for: [id number]               Bone Pathology Markers Lab Results  Component Value Date   ALKPHOS 71 04/11/2017   CALCIUM 9.1 04/11/2017                 Coagulation Parameters Lab Results  Component Value Date   PLT 217 04/11/2017                 Cardiovascular Markers Lab Results  Component Value Date   BNP 379 (H) 09/09/2012   HGB 13.7 04/11/2017   HCT 42.0 04/11/2017                 Note: Lab results reviewed.  Recent Diagnostic Imaging Review  No results found. Note: Imaging results reviewed.          Meds   Current Outpatient Prescriptions:  .  Ascorbic Acid (VITAMIN C) 1000 MG tablet, Take 1,000 mg by mouth daily., Disp: , Rfl:  .  betamethasone dipropionate (DIPROLENE) 0.05 % ointment, daily. , Disp: , Rfl: 0 .  carvedilol (COREG) 25 MG tablet, Take 1 tablet (25 mg total) by mouth 2 (two) times daily with a meal., Disp: 180 tablet, Rfl: 3 .  Cholecalciferol (VITAMIN D-1000 MAX ST) 1000 UNITS tablet, Take by mouth daily. , Disp: , Rfl:  .  clobetasol (TEMOVATE) 0.05 % external solution, Apply 1 application topically as needed. , Disp: , Rfl:  .  digoxin (LANOXIN) 0.25 MG tablet, Take 1 tablet (0.25 mg total) by mouth daily., Disp: 90 tablet, Rfl: 3 .  fluocinonide (LIDEX) 0.05 % external solution, Apply 1 application topically daily. ,  Disp: , Rfl: 0 .  fluticasone (FLONASE) 50 MCG/ACT nasal spray, Place 2 sprays into both nostrils daily., Disp: 16 g, Rfl: 3 .  furosemide (LASIX) 40 MG tablet, Take 40 mg by mouth daily as needed. Reported on 01/13/2016, Disp: , Rfl:  .  ipratropium (ATROVENT) 0.06 % nasal spray, Place 2 sprays into both nostrils 4 (four) times daily. For up to 5-7 days then stop., Disp: 15 mL, Rfl: 0 .  Ixekizumab (TALTZ) 80 MG/ML SOSY, Inject 80 mg into the skin as directed., Disp: , Rfl:  .  levothyroxine (SYNTHROID, LEVOTHROID) 125 MCG tablet, Take 1 tablet (125 mcg total) by mouth daily., Disp: 90 tablet, Rfl: 3 .  lisinopril (PRINIVIL,ZESTRIL) 5 MG tablet, Take 1 tablet (5 mg total) by mouth daily., Disp: 90 tablet, Rfl: 3 .  meloxicam (MOBIC) 7.5 MG tablet, Take by mouth daily. , Disp: , Rfl:  .  naloxone (NARCAN) nasal spray 4 mg/0.1 mL, To reverse excess sedation from narcotics, Disp: 2 kit, Rfl: 1 .  oxyCODONE 30 MG 12 hr tablet, Take 30 mg by mouth every 12 (twelve) hours., Disp: 60 each, Rfl: 0 .  oxyCODONE-acetaminophen (PERCOCET) 10-325 MG tablet, Take 1 tablet by mouth every 8 (eight) hours as needed for pain., Disp: 90 tablet, Rfl: 0 .  TALTZ 80 MG/ML SOAJ, , Disp: , Rfl:  .  venlafaxine XR (EFFEXOR-XR) 75 MG 24 hr capsule, TAKE ONE CAPSULE BY MOUTH ONCE DAILY WITH BREAKFAST, Disp: 90 capsule, Rfl: 3  ROS  Constitutional: Denies any fever or chills Gastrointestinal: No reported hemesis, hematochezia, vomiting, or acute GI distress Musculoskeletal: Denies any acute onset joint swelling, redness, loss of ROM, or weakness Neurological: No reported episodes of acute onset apraxia, aphasia, dysarthria, agnosia, amnesia, paralysis, loss of coordination, or loss of consciousness  Allergies  Krista Kennedy is allergic to sulfa antibiotics and sulfacetamide sodium.  PFSH  Drug: Krista Kennedy  reports that she does not use drugs. Alcohol:  reports that she does not drink alcohol. Tobacco:  reports that she  has quit smoking. Her smoking use included Cigarettes. She has a 3.75 pack-year smoking history. She has quit using smokeless tobacco. Medical:  has a past medical history of Cardiomyopathy [facility]); CHF (congestive heart failure) ([place]); Hyperlipidemia; Hypertension; Psoriasis; and Thyroid disease. Surgical: Krista Kennedy  has a past surgical history that includes Thyroidectomy; Tonsillectomy; Tubal ligation; and Endometrial ablation (2015). Family: family history includes Heart disease in her father; Hyperlipidemia in her father and mother; Hypertension in her father and mother.  Constitutional Exam  General appearance: Well nourished, well developed, and well hydrated. In no apparent acute distress Vitals:   05/24/17 1438  BP: 132/79  Pulse: 80  Resp: 16  Temp: 98.1 F (36.7 C)  SpO2: 99%  Weight: 160 lb (72.6 kg)  Height: '5\' 10"'$  (1.778 m)   BMI Assessment: Estimated body mass index is 22.96 kg/m as calculated from the following:   Height as of this encounter: '5\' 10"'$  (1.778 m).   Weight as of this encounter: 160 lb (72.6 kg).  BMI interpretation table: BMI level Category Range association with higher incidence of chronic pain  <18 kg/m2 Underweight   18.5-24.9 kg/m2 Ideal body weight   25-29.9 kg/m2 Overweight Increased incidence by 20%  30-34.9 kg/m2 Obese (Class I) Increased incidence by 68%  35-39.9 kg/m2 Severe obesity (Class II) Increased incidence by 136%  >40 kg/m2 Extreme obesity (Class III) Increased incidence by 254%   BMI Readings from Last 4 Encounters:  05/24/17 22.96 kg/m  04/11/17 22.96 kg/m  03/15/17 24.54 kg/m  02/06/17 22.96 kg/m   Wt Readings from Last 4 Encounters:  05/24/17 160 lb (72.6 kg)  04/11/17 160 lb (72.6 kg)  03/15/17 171 lb (77.6 kg)  02/06/17 160 lb (72.6 kg)  Psych/Mental status: Alert, oriented x 3 (person, place, & time)       Eyes: PERLA Respiratory: No evidence of acute respiratory distress  Cervical Spine Area Exam  Skin & Axial  Inspection: No masses, redness, edema, swelling, or associated skin lesions Alignment: Symmetrical Functional ROM: Unrestricted ROM      Stability: No instability detected Muscle Tone/Strength: Functionally intact. No obvious neuro-muscular anomalies detected. Sensory (Neurological): Unimpaired Palpation: No palpable anomalies              Upper Extremity (UE) Exam    Side: Right upper extremity  Side: Left upper extremity  Skin & Extremity  Inspection: Skin color, temperature, and hair growth are WNL. No peripheral edema or cyanosis. No masses, redness, swelling, asymmetry, or associated skin lesions. No contractures.  Skin & Extremity Inspection: Skin color, temperature, and hair growth are WNL. No peripheral edema or cyanosis. No masses, redness, swelling, asymmetry, or associated skin lesions. No contractures.  Functional ROM: Unrestricted ROM          Functional ROM: Unrestricted ROM          Muscle Tone/Strength: Functionally intact. No obvious neuro-muscular anomalies detected.  Muscle Tone/Strength: Functionally intact. No obvious neuro-muscular anomalies detected.  Sensory (Neurological): Unimpaired          Sensory (Neurological): Unimpaired          Palpation: No palpable anomalies              Palpation: No palpable anomalies              Specialized Test(s): Deferred         Specialized Test(s): Deferred          Thoracic Spine Area Exam  Skin & Axial Inspection: No masses, redness, or swelling Alignment: Symmetrical Functional ROM: Unrestricted ROM Stability: No instability detected Muscle Tone/Strength: Functionally intact. No obvious neuro-muscular anomalies detected. Sensory (Neurological): Unimpaired Muscle strength & Tone: No palpable anomalies  Lumbar Spine Area Exam  Skin & Axial Inspection: No masses, redness, or swelling Alignment: Symmetrical Functional ROM: Unrestricted ROM      Stability: No instability detected Muscle Tone/Strength: Functionally intact. No  obvious neuro-muscular anomalies detected. Sensory (Neurological): Unimpaired Palpation: No palpable anomalies       Provocative Tests: Lumbar Hyperextension and rotation test: evaluation deferred today       Lumbar Lateral bending test: evaluation deferred today       Patrick's Maneuver: evaluation deferred today                    Gait & Posture Assessment  Ambulation: Unassisted Gait: Relatively normal for age and body habitus Posture: WNL   Lower Extremity Exam    Side: Right lower extremity  Side: Left lower extremity  Skin & Extremity Inspection: Skin color, temperature, and hair growth are WNL. No peripheral edema or cyanosis. No masses, redness, swelling, asymmetry, or associated skin lesions. No contractures.  Skin & Extremity Inspection: Skin color, temperature, and hair growth are WNL. No peripheral edema or cyanosis. No masses, redness, swelling, asymmetry, or associated skin lesions. No contractures.  Functional ROM: Unrestricted ROM          Functional ROM: Unrestricted ROM          Muscle Tone/Strength: Functionally intact. No obvious neuro-muscular anomalies detected.  Muscle Tone/Strength: Functionally intact. No obvious neuro-muscular anomalies detected.  Sensory (Neurological): Unimpaired  Sensory (Neurological): Unimpaired  Palpation: No palpable anomalies  Palpation: No palpable anomalies   Assessment  Primary Diagnosis & Pertinent Problem List: The primary encounter diagnosis was Sacroiliac joint pain. Diagnoses of Chronic pain syndrome and Hip pain, bilateral were also pertinent to this visit.  Status Diagnosis  Controlled Controlled Controlled 1. Sacroiliac joint pain   2. Chronic pain syndrome   3. Hip pain, bilateral     Problems updated and reviewed during this visit: No problems updated. Plan of Care  Pharmacotherapy (Medications Ordered): No orders of the defined types were placed in this encounter.  New Prescriptions   No medications on file    Medications administered today: Krista Kennedy had no medications administered during this visit. Lab-work,  procedure(s), and/or referral(s): No orders of the defined types were placed in this encounter.  Imaging and/or referral(s): None  Interventional therapies: Planned, scheduled, and/or pending:   Not at this time.   Considering:   ***   Palliative PRN treatment(s):   ***   Provider-requested follow-up: Return in about 2 months (around 07/24/2017) for MedMgmt, w/ Dr. Andree Elk.  Future Appointments Date Time Provider [place]  06/12/2017 9:00 AM Vevelyn Francois, NP [facility] None   Primary Care Physician: Olin Hauser, DO Location: [facility] Facility Note by: Vevelyn Francois NP Date: 05/24/2017; Time: 3:06 PM  Pain Score Disclaimer: We use the NRS-11 scale. This is a self-reported, subjective measurement of pain severity with only modest accuracy. It is used primarily to identify changes within a particular patient. It must be understood that outpatient pain scales are significantly less accurate that those used for research, where they can be applied under ideal controlled circumstances with minimal exposure to variables. In reality, the score is likely to be a combination of pain intensity and pain affect, where pain affect describes the degree of emotional arousal or changes in action readiness caused by the sensory experience of pain. Factors such as social and work situation, setting, emotional state, anxiety levels, expectation, and prior pain experience may influence pain perception and show large inter-individual differences that may also be affected by time variables.  Patient instructions provided during this appointment: Patient Instructions   ____________________________________________________________________________________________  Medication Rules  Applies to: All patients receiving prescriptions (written or  electronic).  Pharmacy of record: Pharmacy where electronic prescriptions will be sent. If written prescriptions are taken to a different pharmacy, please inform the nursing staff. The pharmacy listed in the electronic medical record should be the one where you would like electronic prescriptions to be sent.  Prescription refills: Only during scheduled appointments. Applies to both, written and electronic prescriptions.  NOTE: The following applies primarily to controlled substances (Opioid* Pain Medications).   Patient's responsibilities: 1. Pain Pills: Bring all pain pills to every appointment (except for procedure appointments). 2. Pill Bottles: Bring pills in original pharmacy bottle. Always bring newest bottle. Bring bottle, even if empty. 3. Medication refills: You are responsible for knowing and keeping track of what medications you need refilled. The day before your appointment, write a list of all prescriptions that need to be refilled. Bring that list to your appointment and give it to the admitting nurse. Prescriptions will be written only during appointments. If you forget a medication, it will not be "Called in", "Faxed", or "electronically sent". You will need to get another appointment to get these prescribed. 4. Prescription Accuracy: You are responsible for carefully inspecting your prescriptions before leaving our office. Have the discharge nurse carefully go over each prescription with you, before taking them home. Make sure that your name is accurately spelled, that your address is correct. Check the name and dose of your medication to make sure it is accurate. Check the number of pills, and the written instructions to make sure they are clear and accurate. Make sure that you are given enough medication to last until your next medication refill appointment. 5. Taking Medication: Take medication as prescribed. Never take more pills than instructed. Never take medication more  frequently than prescribed. Taking less pills or less frequently is permitted and encouraged, when it comes to controlled substances (written prescriptions).  6. Inform other Doctors: Always inform, all of your healthcare providers, of all the medications you  take. 7. Pain Medication from other Providers: You are not allowed to accept any additional pain medication from any other Doctor or Healthcare provider. There are two exceptions to this rule. (see below) In the event that you require additional pain medication, you are responsible for notifying us, as stated below. 8. Medication Agreement: You are responsible for carefully reading and following our Medication Agreement. This must be signed before receiving any prescriptions from our practice. Safely store a copy of your signed Agreement. Violations to the Agreement will result in no further prescriptions. (Additional copies of our Medication Agreement are available upon request.) 9. Laws, Rules, & Regulations: All patients are expected to follow all Federal and [employer], [employer], Rules, [facility] [facility]. Ignorance of the Laws does not constitute a valid excuse. The use of any illegal substances is prohibited. 10. Adopted CDC guidelines & recommendations: Target dosing levels will be at or below 60 MME/day. Use of benzodiazepines** is not recommended.  Exceptions: There are only two exceptions to the rule of not receiving pain medications from other Healthcare Providers. 1. Exception #1 (Emergencies): In the event of an emergency (i.e.: accident requiring emergency care), you are allowed to receive additional pain medication. However, you are responsible for: As soon as you are able, call our office [phone], at any time of the day or night, and leave a message stating your name, the date and nature of the emergency, and the name and dose of the medication prescribed. In the event that your call is answered by a member of our staff, make sure to  document and save the date, time, and the name of the person that took your information.  2. Exception #2 (Planned Surgery): In the event that you are scheduled by another doctor or dentist to have any type of surgery or procedure, you are allowed (for a period no longer than 30 days), to receive additional pain medication, for the acute post-op pain. However, in this case, you are responsible for picking up a copy of our "Post-op Pain Management for Surgeons" handout, and giving it to your surgeon or dentist. This document is available at our office, and does not require an appointment to obtain it. Simply go to our office during business hours (Monday-Thursday from 8:00 AM to 4:00 PM) (Friday 8:00 AM to 12:00 Noon) or if you have a scheduled appointment with [place], prior to your surgery, and ask for it by name. In addition, you will need to provide [place] with your name, name of your surgeon, type of surgery, and date of procedure or surgery.  *Opioid medications include: morphine, codeine, oxycodone, oxymorphone, hydrocodone, hydromorphone, meperidine, tramadol, tapentadol, buprenorphine, fentanyl, methadone. **Benzodiazepine medications include: diazepam (Valium), alprazolam (Xanax), clonazepam (Klonopine), lorazepam (Ativan), clorazepate (Tranxene), chlordiazepoxide (Librium), estazolam (Prosom), oxazepam (Serax), temazepam (Restoril), triazolam (Halcion)  ____________________________________________________________________________________________

## 2017-05-24 NOTE — Patient Instructions (Addendum)
____________________________________________________________________________________________  Medication Rules  Applies to: All patients receiving prescriptions (written or electronic).  Pharmacy of record: Pharmacy where electronic prescriptions will be sent. If written prescriptions are taken to a different pharmacy, please inform the nursing staff. The pharmacy listed in the electronic medical record should be the one where you would like electronic prescriptions to be sent.  Prescription refills: Only during scheduled appointments. Applies to both, written and electronic prescriptions.  NOTE: The following applies primarily to controlled substances (Opioid* Pain Medications).   Patient's responsibilities: 1. Pain Pills: Bring all pain pills to every appointment (except for procedure appointments). 2. Pill Bottles: Bring pills in original pharmacy bottle. Always bring newest bottle. Bring bottle, even if empty. 3. Medication refills: You are responsible for knowing and keeping track of what medications you need refilled. The day before your appointment, write a list of all prescriptions that need to be refilled. Bring that list to your appointment and give it to the admitting nurse. Prescriptions will be written only during appointments. If you forget a medication, it will not be "Called in", "Faxed", or "electronically sent". You will need to get another appointment to get these prescribed. 4. Prescription Accuracy: You are responsible for carefully inspecting your prescriptions before leaving our office. Have the discharge nurse carefully go over each prescription with you, before taking them home. Make sure that your name is accurately spelled, that your address is correct. Check the name and dose of your medication to make sure it is accurate. Check the number of pills, and the written instructions to make sure they are clear and accurate. Make sure that you are given enough medication to  last until your next medication refill appointment. 5. Taking Medication: Take medication as prescribed. Never take more pills than instructed. Never take medication more frequently than prescribed. Taking less pills or less frequently is permitted and encouraged, when it comes to controlled substances (written prescriptions).  6. Inform other Doctors: Always inform, all of your healthcare providers, of all the medications you take. 7. Pain Medication from other Providers: You are not allowed to accept any additional pain medication from any other Doctor or Healthcare provider. There are two exceptions to this rule. (see below) In the event that you require additional pain medication, you are responsible for notifying us, as stated below. 8. Medication Agreement: You are responsible for carefully reading and following our Medication Agreement. This must be signed before receiving any prescriptions from our practice. Safely store a copy of your signed Agreement. Violations to the Agreement will result in no further prescriptions. (Additional copies of our Medication Agreement are available upon request.) 9. Laws, Rules, & Regulations: All patients are expected to follow all Federal and State Laws, Statutes, Rules, & Regulations. Ignorance of the Laws does not constitute a valid excuse. The use of any illegal substances is prohibited. 10. Adopted CDC guidelines & recommendations: Target dosing levels will be at or below 60 MME/day. Use of benzodiazepines** is not recommended.  Exceptions: There are only two exceptions to the rule of not receiving pain medications from other Healthcare Providers. 1. Exception #1 (Emergencies): In the event of an emergency (i.e.: accident requiring emergency care), you are allowed to receive additional pain medication. However, you are responsible for: As soon as you are able, call our office [phone], at any time of the day or night, and leave a message stating your  name, the date and nature of the emergency, and the name and dose of the medication   prescribed. In the event that your call is answered by a member of our staff, make sure to document and save the date, time, and the name of the person that took your information.  2. Exception #2 (Planned Surgery): In the event that you are scheduled by another doctor or dentist to have any type of surgery or procedure, you are allowed (for a period no longer than 30 days), to receive additional pain medication, for the acute post-op pain. However, in this case, you are responsible for picking up a copy of our "Post-op Pain Management for Surgeons" handout, and giving it to your surgeon or dentist. This document is available at our office, and does not require an appointment to obtain it. Simply go to our office during business hours (Monday-Thursday from 8:00 AM to 4:00 PM) (Friday 8:00 AM to 12:00 Noon) or if you have a scheduled appointment with [place], prior to your surgery, and ask for it by name. In addition, you will need to provide [place] with your name, name of your surgeon, type of surgery, and date of procedure or surgery.  *Opioid medications include: morphine, codeine, oxycodone, oxymorphone, hydrocodone, hydromorphone, meperidine, tramadol, tapentadol, buprenorphine, fentanyl, methadone. **Benzodiazepine medications include: diazepam (Valium), alprazolam (Xanax), clonazepam (Klonopine), lorazepam (Ativan), clorazepate (Tranxene), chlordiazepoxide (Librium), estazolam (Prosom), oxazepam (Serax), temazepam (Restoril), triazolam (Halcion)  ____________________________________________________________________________________________   BMI interpretation table: BMI level Category Range association with higher incidence of chronic pain  <18 kg/m2 Underweight   18.5-24.9 kg/m2 Ideal body weight   25-29.9 kg/m2 Overweight Increased incidence by 20%  30-34.9 kg/m2 Obese (Class I) Increased incidence by 68%  35-39.9 kg/m2  Severe obesity (Class II) Increased incidence by 136%  >40 kg/m2 Extreme obesity (Class III) Increased incidence by 254%   BMI Readings from Last 4 Encounters:  05/24/17 22.96 kg/m  04/11/17 22.96 kg/m  03/15/17 24.54 kg/m  02/06/17 22.96 kg/m   Wt Readings from Last 4 Encounters:  05/24/17 160 lb (72.6 kg)  04/11/17 160 lb (72.6 kg)  03/15/17 171 lb (77.6 kg)  02/06/17 160 lb (72.6 kg)

## 2017-05-24 NOTE — Progress Notes (Signed)
Nursing Pain Medication Assessment:  Safety precautions to be maintained throughout the outpatient stay will include: orient to surroundings, keep bed in low position, maintain call bell within reach at all times, provide assistance with transfer out of bed and ambulation.  Medication Inspection Compliance: Pill count conducted under aseptic conditions, in front of the patient. Neither the pills nor the bottle was removed from the patient's sight at any time. Once count was completed pills were immediately returned to the patient in their original bottle.  Medication: See above Pill/Patch Count: 6 of 60 pills remain Pill/Patch Appearance: Markings consistent with prescribed medication Bottle Appearance: Standard pharmacy container. Clearly labeled. Filled Date: 08 / 22 / 2018 Last Medication intake:  Today

## 2017-05-24 NOTE — Progress Notes (Signed)
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 refill appointment. 5. Taking Medication: Take medication as prescribed. Never take more pills than instructed. Never take medication more frequently than prescribed. Taking less pills or less frequently is permitted and encouraged, when it comes to controlled substances (written prescriptions).  6. Inform other Doctors: Always inform, all of your healthcare providers, of all the medications you take. 7. Pain Medication from other Providers: You are not allowed to accept any additional pain medication from any other Doctor or Healthcare provider. There are two exceptions to this rule. (see below) In the event that you require additional pain medication, you are responsible for notifying us, as stated below. 8. Medication Agreement: You are responsible for carefully  reading and following our Medication Agreement. This must be signed before receiving any prescriptions from our practice. Safely store a copy of your signed Agreement. Violations to the Agreement will result in no further prescriptions. (Additional copies of our Medication Agreement are available upon request.) 9. Laws, Rules, & Regulations: All patients are expected to follow all Federal and [employer], [employer], Rules, [employer]. Ignorance of the Laws does not constitute a valid excuse. The use of any illegal substances is prohibited. 10. Adopted CDC guidelines & recommendations: Target dosing levels will be at or below 60 MME/day. Use of benzodiazepines** is not recommended.  Exceptions: There are only two exceptions to the rule of not receiving pain medications from other Healthcare Providers. 1. Exception #1 (Emergencies): In the event of an emergency (i.e.: accident requiring emergency care), you are allowed to receive additional pain medication. However, you are responsible for: As soon as you are able, call our office [phone], at any time of the day or night, and leave a message stating your name, the date and nature of the emergency, and the name and dose of the medication prescribed. In the event that your call is answered by a member of our staff, make sure to document and save the date, time, and the name of the person that took your information.  2. Exception #2 (Planned Surgery): In the event that you are scheduled by another doctor or dentist to have any type of surgery or procedure, you are allowed (for a period no longer than 30 days), to receive additional pain medication, for the acute post-op pain. However, in this case, you are responsible for picking up a copy of our "Post-op Pain Management for Surgeons" handout, and giving it  to your Kennedy or dentist. This document is available at our office, and does not require an appointment to obtain it. Simply go to our office during  business hours (Monday-Thursday from 8:00 AM to 4:00 PM) (Friday 8:00 AM to 12:00 Noon) or if you have a scheduled appointment with [place], prior to your surgery, and ask for it by name. In addition, you will need to provide [place] with your name, name of your Kennedy, type of surgery, and date of procedure or surgery.  *Opioid medications include: morphine, codeine, oxycodone, oxymorphone, hydrocodone, hydromorphone, meperidine, tramadol, tapentadol, buprenorphine, fentanyl, methadone. **Benzodiazepine medications include: diazepam (Valium), alprazolam (Xanax), clonazepam (Klonopine), lorazepam (Ativan), clorazepate (Tranxene), chlordiazepoxide (Librium), estazolam (Prosom), oxazepam (Serax), temazepam (Restoril), triazolam (Halcion)  ____________________________________________________________________________________________   BMI interpretation table: BMI level Category Range association with higher incidence of chronic pain  <18 kg/m2 Underweight   18.5-24.9 kg/m2 Ideal body weight   25-29.9 kg/m2 Overweight Increased incidence by 20%  30-34.9 kg/m2 Obese (Class I) Increased incidence by 68%  35-39.9 kg/m2 Severe obesity (Class II) Increased incidence by 136%  >40 kg/m2 Extreme obesity (Class III) Increased incidence by 254%   BMI Readings from Last 4 Encounters:  05/24/17 22.96 kg/m  04/11/17 22.96 kg/m  03/15/17 24.54 kg/m  02/06/17 22.96 kg/m   Wt Readings from Last 4 Encounters:  05/24/17 160 lb (72.6 kg)  04/11/17 160 lb (72.6 kg)  03/15/17 171 lb (77.6 kg)  02/06/17 160 lb (72.6 kg)

## 2017-05-29 ENCOUNTER — Telehealth: Payer: Self-pay | Admitting: Anesthesiology

## 2017-05-29 NOTE — Telephone Encounter (Signed)
Called to patient, she is trying to get this Rx filled through [facility] because [facility] did not have the medication on hand and did know how long it would take to get it.  I will check with [facility] to see if they generated the need for PA

## 2017-05-29 NOTE — Telephone Encounter (Signed)
Needs prior auth for new meds written last week. Please contact patient when she can pick up meds

## 2017-05-29 NOTE — Telephone Encounter (Signed)
Called patient to confirm medication needing PA.  States it is the Embeda.  I told patient I would try and get that filled out right away and sent off for her.  Patient verbalizes u/o information.

## 2017-05-29 NOTE — Telephone Encounter (Signed)
PA sent for Embeda through cover my meds.

## 2017-05-30 ENCOUNTER — Telehealth: Payer: Self-pay | Admitting: *Deleted

## 2017-05-30 NOTE — Telephone Encounter (Signed)
Patient lost her Embedda prescription. Her insurance has not approved Oxycodone, so she hasnt gotten it. States she is having withdrawal symptoms. Will you prescribe somethng for withdrawal symptoms? Dr. Andree Elk patient, but you have seen her.

## 2017-05-30 NOTE — Telephone Encounter (Signed)
Please clarify what symptoms she is having so I can treat them specifically. She still should have Percocet to use. Ask her if she has completed the images maybe Dr Andree Elk can do a procedure for her to help with her pain.

## 2017-05-30 NOTE — Telephone Encounter (Signed)
Found the prescription for [facility]

## 2017-06-12 ENCOUNTER — Encounter: Payer: Self-pay | Admitting: Nurse Practitioner

## 2017-06-12 ENCOUNTER — Ambulatory Visit: Payer: Commercial Managed Care - PPO | Attending: Nurse Practitioner | Admitting: Nurse Practitioner

## 2017-06-12 VITALS — BP 156/90 | HR 88 | Temp 97.9°F | Resp 18 | Ht 70.0 in | Wt 160.0 lb

## 2017-06-12 DIAGNOSIS — I110 Hypertensive heart disease with heart failure: Secondary | ICD-10-CM | POA: Insufficient documentation

## 2017-06-12 DIAGNOSIS — Z7951 Long term (current) use of inhaled steroids: Secondary | ICD-10-CM | POA: Diagnosis not present

## 2017-06-12 DIAGNOSIS — M533 Sacrococcygeal disorders, not elsewhere classified: Secondary | ICD-10-CM | POA: Diagnosis not present

## 2017-06-12 DIAGNOSIS — L4050 Arthropathic psoriasis, unspecified: Secondary | ICD-10-CM | POA: Diagnosis not present

## 2017-06-12 DIAGNOSIS — G894 Chronic pain syndrome: Secondary | ICD-10-CM

## 2017-06-12 DIAGNOSIS — E559 Vitamin D deficiency, unspecified: Secondary | ICD-10-CM | POA: Diagnosis not present

## 2017-06-12 DIAGNOSIS — K219 Gastro-esophageal reflux disease without esophagitis: Secondary | ICD-10-CM | POA: Insufficient documentation

## 2017-06-12 DIAGNOSIS — T402X5A Adverse effect of other opioids, initial encounter: Secondary | ICD-10-CM | POA: Insufficient documentation

## 2017-06-12 DIAGNOSIS — M25552 Pain in left hip: Secondary | ICD-10-CM

## 2017-06-12 DIAGNOSIS — M5442 Lumbago with sciatica, left side: Secondary | ICD-10-CM | POA: Diagnosis not present

## 2017-06-12 DIAGNOSIS — E039 Hypothyroidism, unspecified: Secondary | ICD-10-CM | POA: Insufficient documentation

## 2017-06-12 DIAGNOSIS — G4700 Insomnia, unspecified: Secondary | ICD-10-CM | POA: Diagnosis not present

## 2017-06-12 DIAGNOSIS — R739 Hyperglycemia, unspecified: Secondary | ICD-10-CM | POA: Diagnosis not present

## 2017-06-12 DIAGNOSIS — M25532 Pain in left wrist: Secondary | ICD-10-CM | POA: Diagnosis not present

## 2017-06-12 DIAGNOSIS — M25531 Pain in right wrist: Secondary | ICD-10-CM | POA: Diagnosis not present

## 2017-06-12 DIAGNOSIS — I5022 Chronic systolic (congestive) heart failure: Secondary | ICD-10-CM | POA: Insufficient documentation

## 2017-06-12 DIAGNOSIS — Z79899 Other long term (current) drug therapy: Secondary | ICD-10-CM | POA: Diagnosis not present

## 2017-06-12 DIAGNOSIS — M5441 Lumbago with sciatica, right side: Secondary | ICD-10-CM

## 2017-06-12 DIAGNOSIS — Z87891 Personal history of nicotine dependence: Secondary | ICD-10-CM | POA: Diagnosis not present

## 2017-06-12 DIAGNOSIS — F329 Major depressive disorder, single episode, unspecified: Secondary | ICD-10-CM | POA: Insufficient documentation

## 2017-06-12 DIAGNOSIS — G8929 Other chronic pain: Secondary | ICD-10-CM | POA: Diagnosis not present

## 2017-06-12 DIAGNOSIS — Z8249 Family history of ischemic heart disease and other diseases of the circulatory system: Secondary | ICD-10-CM | POA: Insufficient documentation

## 2017-06-12 DIAGNOSIS — Z5181 Encounter for therapeutic drug level monitoring: Secondary | ICD-10-CM | POA: Diagnosis not present

## 2017-06-12 DIAGNOSIS — K5903 Drug induced constipation: Secondary | ICD-10-CM | POA: Insufficient documentation

## 2017-06-12 DIAGNOSIS — M25551 Pain in right hip: Secondary | ICD-10-CM | POA: Diagnosis not present

## 2017-06-12 DIAGNOSIS — J301 Allergic rhinitis due to pollen: Secondary | ICD-10-CM | POA: Insufficient documentation

## 2017-06-12 MED ORDER — MORPHINE-NALTREXONE 20-0.8 MG PO CPCR: 1.0000 | ORAL_CAPSULE | Freq: Two times a day (BID) | ORAL | 0 refills | Status: DC

## 2017-06-12 MED ORDER — OXYCODONE-ACETAMINOPHEN 10-325 MG PO TABS: 1.0000 | ORAL_TABLET | Freq: Three times a day (TID) | ORAL | 0 refills | Status: DC | PRN

## 2017-06-12 NOTE — Patient Instructions (Signed)

## 2017-06-12 NOTE — Progress Notes (Signed)
Nursing Pain Medication Assessment:  Safety precautions to be maintained throughout the outpatient stay will include: orient to surroundings, keep bed in low position, maintain call bell within reach at all times, provide assistance with transfer out of bed and ambulation.  Medication Inspection Compliance: Pill count conducted under aseptic conditions, in front of the patient. Neither the pills nor the bottle was removed from the patient's sight at any time. Once count was completed pills were immediately returned to the patient in their original bottle.  Medication: See above Pill/Patch Count: 10 of 30 pills remain Pill/Patch Appearance: Markings consistent with prescribed medication Bottle Appearance: Standard pharmacy container. Clearly labeled. Filled Date: 09 / 07 / 2018 Last Medication intake:  Today   Pharm has 30 pills not included.

## 2017-06-12 NOTE — Progress Notes (Signed)
Patient's Name: Krista Kennedy  MRN: [id number]  Referring Provider: Nobie Putnam *  DOB: 24-Jul-1966  PCP: Olin Hauser, DO  DOS: 06/12/2017  Note by: Vevelyn Francois NP  Service setting: Ambulatory outpatient  Specialty: Interventional Pain Management  Location: [facility] (AMB) Pain Management Facility    Patient type: Established    Primary Reason(s) for Visit: Encounter for prescription drug management. (Level of risk: moderate)  CC: Hip Pain (bilateral- left worse); Wrist Pain (bilateral, left worse); and Migraine  HPI  [name] is a 51 y.o. year old, female patient, who comes today for a medication management evaluation. She has Hypertension; Psoriasis; Chronic systolic heart failure ([place]); Depression; Dysmenorrhea; Fibroid; GERD (gastroesophageal reflux disease); Hypothyroidism; Chronic low back pain; Menorrhagia; High risk medications (not anticoagulants) long-term use; Psoriasis with arthropathy ([place]); Avitaminosis D; Opioid dependence, daily use ([place]); Allergic rhinitis due to pollen; Other long term (current) drug therapy; Chronic pain syndrome; Chronic hip pain; Pain in multiple finger joints; Therapeutic opioid induced constipation; Hyperlipidemia; Elevated hemoglobin A1c; Left wrist pain; Chronic sacroiliac joint pain; Abnormal mammogram; Breast screening; Encounter for long-term (current) use of other medications; Encounter for routine gynecological examination; Hyperglycemia; and Insomnia on her problem list. Her primarily concern today is the Hip Pain (bilateral- left worse); Wrist Pain (bilateral, left worse); and Migraine  Pain Assessment: Location:   Hip Radiating: down side of feet to foot Onset: More than a month ago Duration: Chronic pain Quality: Aching, Discomfort, Nagging Severity: 4 /10 (self-reported pain score)  Note: Reported level is compatible with observation.                    Effect on ADL: pace self Timing:   Modifying factors:  medication  [name] was last scheduled for an appointment on 05/24/2017 for medication management. During today's appointment we reviewed [name]'s chronic pain status, as well as her outpatient medication regimen. She feels like the Embeda is effecting her Effexor. She states that the Embeda "gets the pain but" She does not feel like it is strong enough. She admits that the Effexor is taken at night because it makes her sleepy. She feels like the Embeda maybe effecting this but she is not sure how. She states that she is getting side effects like she is not taking the Effexor. She can not explain what the reaction is. She admits that her pain is worse in her hips.    The patient  reports that she does not use drugs. Her body mass index is 22.96 kg/m.  Further details on both, my assessment(s), as well as the proposed treatment plan, please see below.  Controlled Substance Pharmacotherapy Assessment REMS (Risk Evaluation and Mitigation Strategy)  Analgesic: Embeda 70m BID  MME/day: 40 mg/day.  GIgnatius Specking RN  06/12/2017  9:30 AM  Sign at close encounter Nursing Pain Medication Assessment:  Safety precautions to be maintained throughout the outpatient stay will include: orient to surroundings, keep bed in low position, maintain call bell within reach at all times, provide assistance with transfer out of bed and ambulation.  Medication Inspection Compliance: Pill count conducted under aseptic conditions, in front of the patient. Neither the pills nor the bottle was removed from the patient's sight at any time. Once count was completed pills were immediately returned to the patient in their original bottle.  Medication: See above Pill/Patch Count: 10 of 30 pills remain Pill/Patch Appearance: Markings consistent with prescribed medication Bottle Appearance: Standard pharmacy container. Clearly labeled. Filled  Date: 09 / 07 / 2018 Last Medication intake:  Today   Pharm has 30 pills  not included.   Pharmacokinetics: Liberation and absorption (onset of action): WNL Distribution (time to peak effect): WNL Metabolism and excretion (duration of action): WNL         Pharmacodynamics: Desired effects: Analgesia: [name] reports >50% benefit. Functional ability: Patient reports that medication allows her to accomplish basic ADLs Clinically meaningful improvement in function (CMIF): Sustained CMIF goals met Perceived effectiveness: Described as relatively effective, allowing for increase in activities of daily living (ADL) Undesirable effects: Side-effects or Adverse reactions: None reported Monitoring: [place] PMP: Online review of the past 38-monthperiod conducted. Compliant with practice rules and regulations Last UDS on record: Summary  Date Value Ref Range Status  12/08/2016 FINAL  Final    Comment:    ==================================================================== TOXASSURE SELECT 13 (MW) ==================================================================== Test                             Result       Flag       Units Drug Present and Declared for Prescription Verification   Oxycodone                      6012         EXPECTED   ng/mg creat   Oxymorphone                    4933         EXPECTED   ng/mg creat   Noroxycodone                   >6536        EXPECTED   ng/mg creat   Noroxymorphone                 1644         EXPECTED   ng/mg creat    Sources of oxycodone are scheduled prescription medications.    Oxymorphone, noroxycodone, and noroxymorphone are expected    metabolites of oxycodone. Oxymorphone is also available as a    scheduled prescription medication. ==================================================================== Test                      Result    Flag   Units      Ref Range   Creatinine              153              mg/dL      >=20 ==================================================================== Declared Medications:  The  flagging and interpretation on this report are based on the  following declared medications.  Unexpected results may arise from  inaccuracies in the declared medications.  **Note: The testing scope of this panel includes these medications:  Oxycodone  Oxycodone (Percocet)  **Note: The testing scope of this panel does not include following  reported medications:  Acetaminophen (Percocet)  Betamethasone (Diprolene)  Carvedilol (Coreg)  Cholecalciferol  Clindamycin (Cleocin)  Digoxin (Lanoxin)  Fluticasone (Flonase)  Furosemide (Lasix)  Levothyroxine  Lisinopril  Meloxicam (Mobic)  Metronidazole (Flagyl)  Naloxone (Narcan)  Olopatadine (patanol)  Topical (Lidex)  Ustekinumab  Venlafaxine (Effexor)  Vitamin C ==================================================================== For clinical consultation, please call [phone] ====================================================================    UDS interpretation: Compliant          Medication Assessment Form: Reviewed. Patient indicates being compliant  with therapy Treatment compliance: Compliant Risk Assessment Profile: Aberrant behavior: See prior evaluations. None observed or detected today Comorbid factors increasing risk of overdose: See prior notes. No additional risks detected today Risk of substance use disorder (SUD): Low     Opioid Risk Tool - 06/12/17 0929      Family History of Substance Abuse   Alcohol Negative   Illegal Drugs Negative   Rx Drugs Negative     Personal History of Substance Abuse   Alcohol Negative   Illegal Drugs Negative   Rx Drugs Negative     Total Score   Opioid Risk Tool Scoring 0   Opioid Risk Interpretation Low Risk     ORT Scoring interpretation table:  Score <3 = Low Risk for SUD  Score between 4-7 = Moderate Risk for SUD  Score >8 = High Risk for Opioid Abuse   Risk Mitigation Strategies:  Patient Counseling: Covered Patient-Prescriber Agreement (PPA): Present  and active  Notification to other healthcare providers: Done  Pharmacologic Plan: No change in therapy, at this time  Laboratory Chemistry  Inflammation Markers (CRP: Acute Phase) (ESR: Chronic Phase) No results found for: CRP, ESRSEDRATE               Renal Function Markers Lab Results  Component Value Date   BUN 12 04/11/2017   CREATININE 0.85 04/11/2017   GFRAA >89 04/11/2017   GFRNONAA 80 04/11/2017                 Hepatic Function Markers Lab Results  Component Value Date   AST 17 04/11/2017   ALT 13 04/11/2017   ALBUMIN 4.1 04/11/2017   ALKPHOS 71 04/11/2017                 Electrolytes Lab Results  Component Value Date   NA 139 04/11/2017   K 3.9 04/11/2017   CL 104 04/11/2017   CALCIUM 9.1 04/11/2017                 Neuropathy Markers No results found for: [id number]               Bone Pathology Markers Lab Results  Component Value Date   ALKPHOS 71 04/11/2017   CALCIUM 9.1 04/11/2017                 Coagulation Parameters Lab Results  Component Value Date   PLT 217 04/11/2017                 Cardiovascular Markers Lab Results  Component Value Date   BNP 379 (H) 09/09/2012   HGB 13.7 04/11/2017   HCT 42.0 04/11/2017                 Note: Lab results reviewed.  Recent Diagnostic Imaging Results   Complexity Note: Imaging results reviewed. Results shared with Ms. Wolfer, using Layman's terms.                         Meds   Current Outpatient Prescriptions:  .  Ascorbic Acid (VITAMIN C) 1000 MG tablet, Take 1,000 mg by mouth daily., Disp: , Rfl:  .  betamethasone dipropionate (DIPROLENE) 0.05 % ointment, daily. , Disp: , Rfl: 0 .  carvedilol (COREG) 25 MG tablet, Take 1 tablet (25 mg total) by mouth 2 (two) times daily with a meal., Disp: 180 tablet, Rfl: 3 .  Cholecalciferol (VITAMIN D-1000 MAX ST) 1000 UNITS tablet, Take by  mouth daily. , Disp: , Rfl:  .  clobetasol (TEMOVATE) 0.05 % external solution, Apply 1 application topically as  needed. , Disp: , Rfl:  .  digoxin (LANOXIN) 0.25 MG tablet, Take 1 tablet (0.25 mg total) by mouth daily., Disp: 90 tablet, Rfl: 3 .  fluocinonide (LIDEX) 0.05 % external solution, Apply 1 application topically daily. , Disp: , Rfl: 0 .  fluticasone (FLONASE) 50 MCG/ACT nasal spray, Place 2 sprays into both nostrils daily., Disp: 16 g, Rfl: 3 .  furosemide (LASIX) 40 MG tablet, Take 40 mg by mouth daily as needed. Reported on 01/13/2016, Disp: , Rfl:  .  ipratropium (ATROVENT) 0.06 % nasal spray, Place 2 sprays into both nostrils 4 (four) times daily. For up to 5-7 days then stop., Disp: 15 mL, Rfl: 0 .  Ixekizumab (TALTZ) 80 MG/ML SOSY, Inject 80 mg into the skin as directed., Disp: , Rfl:  .  levothyroxine (SYNTHROID, LEVOTHROID) 125 MCG tablet, Take 1 tablet (125 mcg total) by mouth daily., Disp: 90 tablet, Rfl: 3 .  lisinopril (PRINIVIL,ZESTRIL) 5 MG tablet, Take 1 tablet (5 mg total) by mouth daily., Disp: 90 tablet, Rfl: 3 .  meloxicam (MOBIC) 7.5 MG tablet, Take by mouth daily. , Disp: , Rfl:  .  Morphine-Naltrexone (EMBEDA) 20-0.8 MG CPCR, Take 1 capsule by mouth 2 (two) times daily., Disp: 60 capsule, Rfl: 0 .  naloxone (NARCAN) nasal spray 4 mg/0.1 mL, To reverse excess sedation from narcotics, Disp: 2 kit, Rfl: 1 .  TALTZ 80 MG/ML SOAJ, , Disp: , Rfl:  .  venlafaxine XR (EFFEXOR-XR) 75 MG 24 hr capsule, TAKE ONE CAPSULE BY MOUTH ONCE DAILY WITH BREAKFAST, Disp: 90 capsule, Rfl: 3 .  oxyCODONE-acetaminophen (PERCOCET) 10-325 MG tablet, Take 1 tablet by mouth every 8 (eight) hours as needed for pain., Disp: 90 tablet, Rfl: 0  ROS  Constitutional: Denies any fever or chills Gastrointestinal: No reported hemesis, hematochezia, vomiting, or acute GI distress Musculoskeletal: Denies any acute onset joint swelling, redness, loss of ROM, or weakness Neurological: No reported episodes of acute onset apraxia, aphasia, dysarthria, agnosia, amnesia, paralysis, loss of coordination, or loss of  consciousness  Allergies  Ms. Krakowski is allergic to sulfa antibiotics and sulfacetamide sodium.  PFSH  Drug: Ms. Aaronson  reports that she does not use drugs. Alcohol:  reports that she does not drink alcohol. Tobacco:  reports that she has quit smoking. Her smoking use included Cigarettes. She has a 3.75 pack-year smoking history. She has quit using smokeless tobacco. Medical:  has a past medical history of Cardiomyopathy [facility]); CHF (congestive heart failure) ([place]); Hyperlipidemia; Hypertension; Psoriasis; and Thyroid disease. Surgical: Ms. Lora  has a past surgical history that includes Thyroidectomy; Tonsillectomy; Tubal ligation; and Endometrial ablation (2015). Family: family history includes Heart disease in her father; Hyperlipidemia in her father and mother; Hypertension in her father and mother.  Constitutional Exam  General appearance: Well nourished, well developed, and well hydrated. In no apparent acute distress Vitals:   06/12/17 0920  BP: (!) 156/90  Pulse: 88  Resp: 18  Temp: 97.9 F (36.6 C)  SpO2: 100%  Weight: 160 lb (72.6 kg)  Height: _0  (1.778 m)   BMI Assessment: Estimated body mass index is 22.96 kg/m as calculated from the following:   Height as of this encounter: _1  (1.778 m).   Weight as of this encounter: 160 lb (72.6 kg). Psych/Mental status: Alert, oriented x 3 (person, place, & time)  Eyes: PERLA Respiratory: No evidence of acute respiratory distress  Cervical Spine Area Exam  Skin & Axial Inspection: No masses, redness, edema, swelling, or associated skin lesions Alignment: Symmetrical Functional ROM: Unrestricted ROM      Stability: No instability detected Muscle Tone/Strength: Functionally intact. No obvious neuro-muscular anomalies detected. Sensory (Neurological): Unimpaired Palpation: No palpable anomalies              Upper Extremity (UE) Exam    Side: Right upper extremity  Side: Left upper extremity  Skin & Extremity  Inspection: Skin color, temperature, and hair growth are WNL. No peripheral edema or cyanosis. No masses, redness, swelling, asymmetry, or associated skin lesions. No contractures.  Skin & Extremity Inspection: Skin color, temperature, and hair growth are WNL. No peripheral edema or cyanosis. No masses, redness, swelling, asymmetry, or associated skin lesions. No contractures.  Functional ROM: Unrestricted ROM          Functional ROM: Unrestricted ROM          Muscle Tone/Strength: Functionally intact. No obvious neuro-muscular anomalies detected.  Muscle Tone/Strength: Functionally intact. No obvious neuro-muscular anomalies detected.  Sensory (Neurological): Unimpaired          Sensory (Neurological): Unimpaired          Palpation: No palpable anomalies              Palpation: No palpable anomalies              Specialized Test(s): Deferred         Specialized Test(s): Deferred          Thoracic Spine Area Exam  Skin & Axial Inspection: No masses, redness, or swelling Alignment: Symmetrical Functional ROM: Unrestricted ROM Stability: No instability detected Muscle Tone/Strength: Functionally intact. No obvious neuro-muscular anomalies detected. Sensory (Neurological): Unimpaired Muscle strength & Tone: No palpable anomalies  Lumbar Spine Area Exam  Skin & Axial Inspection: No masses, redness, or swelling Alignment: Symmetrical Functional ROM: Unrestricted ROM      Stability: No instability detected Muscle Tone/Strength: Functionally intact. No obvious neuro-muscular anomalies detected. Sensory (Neurological): Unimpaired Palpation: No palpable anomalies       Provocative Tests: Lumbar Hyperextension and rotation test: evaluation deferred today       Lumbar Lateral bending test: evaluation deferred today       Patrick's Maneuver: evaluation deferred today                    Gait & Posture Assessment  Ambulation: Unassisted Gait: Relatively normal for age and body habitus Posture: WNL    Lower Extremity Exam    Side: Right lower extremity  Side: Left lower extremity  Skin & Extremity Inspection: Skin color, temperature, and hair growth are WNL. No peripheral edema or cyanosis. No masses, redness, swelling, asymmetry, or associated skin lesions. No contractures.  Skin & Extremity Inspection: Skin color, temperature, and hair growth are WNL. No peripheral edema or cyanosis. No masses, redness, swelling, asymmetry, or associated skin lesions. No contractures.  Functional ROM: Unrestricted ROM          Functional ROM: Unrestricted ROM          Muscle Tone/Strength: Functionally intact. No obvious neuro-muscular anomalies detected.  Muscle Tone/Strength: Functionally intact. No obvious neuro-muscular anomalies detected.  Sensory (Neurological): Unimpaired  Sensory (Neurological): Unimpaired  Palpation: No palpable anomalies  Palpation: No palpable anomalies   Assessment  Primary Diagnosis & Pertinent Problem List: The primary encounter diagnosis was Chronic left hip pain. Diagnoses  of Chronic bilateral low back pain with bilateral sciatica, Chronic sacroiliac joint pain, and Chronic pain syndrome were also pertinent to this visit.  Status Diagnosis  Controlled Controlled Controlled 1. Chronic left hip pain   2. Chronic bilateral low back pain with bilateral sciatica   3. Chronic sacroiliac joint pain   4. Chronic pain syndrome     Problems updated and reviewed during this visit: No problems updated. Plan of Care  Pharmacotherapy (Medications Ordered): Meds ordered this encounter  Medications  . oxyCODONE-acetaminophen (PERCOCET) 10-325 MG tablet    Sig: Take 1 tablet by mouth every 8 (eight) hours as needed for pain.    Dispense:  90 tablet    Refill:  0    Do not place this medication, or any other prescription from our practice, on "Automatic Refill". Patient may have prescription filled one day early if pharmacy is closed on scheduled refill date. Do not fill until:  06/12/2017 To last until:07/12/2017    Order Specific Question:   Supervising Provider    Answer:   Molli Barrows [[id number]]  . DISCONTD: Morphine-Naltrexone (EMBEDA) 20-0.8 MG CPCR    Sig: Take 1 capsule by mouth 2 (two) times daily.    Dispense:  60 capsule    Refill:  0    Mat fill 05/26/17    Order Specific Question:   Supervising Provider    Answer:   Molli Barrows [[id number]]  . Morphine-Naltrexone (EMBEDA) 20-0.8 MG CPCR    Sig: Take 1 capsule by mouth 2 (two) times daily.    Dispense:  60 capsule    Refill:  0    May fill 06/25/17    Order Specific Question:   Supervising Provider    Answer:   Theressa Stamps   New Prescriptions   No medications on file   Medications administered today: Ms. Maulding had no medications administered during this visit. Lab-work, procedure(s), and/or referral(s): No orders of the defined types were placed in this encounter.  Imaging and/or referral(s): None  Interventional therapies: Planned, scheduled, and/or pending:   One month Rx given secondary to pt not being compliant with her orders (xrays not completed greater than 2 month)   Considering:   Not at this time   Palliative PRN treatment(s):   Not at this time   Provider-requested follow-up: Return in about 4 weeks (around 07/10/2017) for MedMgmt, w/ Dr. Andree Elk.  Future Appointments Date Time Provider [place]  07/06/2017 3:15 PM Molli Barrows, MD [facility] None   Primary Care Physician: Olin Hauser, DO Location: [facility] Note by: Vevelyn Francois NP Date: 06/12/2017; Time: 11:38 AM  Pain Score Disclaimer: We use the NRS-11 scale. This is a self-reported, subjective measurement of pain severity with only modest accuracy. It is used primarily to identify changes within a particular patient. It must be understood that outpatient pain scales are significantly less accurate that those used for research, where they can be applied  under ideal controlled circumstances with minimal exposure to variables. In reality, the score is likely to be a combination of pain intensity and pain affect, where pain affect describes the degree of emotional arousal or changes in action readiness caused by the sensory experience of pain. Factors such as social and work situation, setting, emotional state, anxiety levels, expectation, and prior pain experience may influence pain perception and show large inter-individual differences that may also be affected by time variables.  Patient instructions provided during this  appointment: Patient Instructions   ____________________________________________________________________________________________  Medication Rules  Applies to: All patients receiving prescriptions (written or electronic).  Pharmacy of record: Pharmacy where electronic prescriptions will be sent. If written prescriptions are taken to a different pharmacy, please inform the nursing staff. The pharmacy listed in the electronic medical record should be the one where you would like electronic prescriptions to be sent.  Prescription refills: Only during scheduled appointments. Applies to both, written and electronic prescriptions.  NOTE: The following applies primarily to controlled substances (Opioid* Pain Medications).   Patient's responsibilities: 1. Pain Pills: Bring all pain pills to every appointment (except for procedure appointments). 2. Pill Bottles: Bring pills in original pharmacy bottle. Always bring newest bottle. Bring bottle, even if empty. 3. Medication refills: You are responsible for knowing and keeping track of what medications you need refilled. The day before your appointment, write a list of all prescriptions that need to be refilled. Bring that list to your appointment and give it to the admitting nurse. Prescriptions will be written only during appointments. If you forget a medication, it will not be "Called  in", "Faxed", or "electronically sent". You will need to get another appointment to get these prescribed. 4. Prescription Accuracy: You are responsible for carefully inspecting your prescriptions before leaving our office. Have the discharge nurse carefully go over each prescription with you, before taking them home. Make sure that your name is accurately spelled, that your address is correct. Check the name and dose of your medication to make sure it is accurate. Check the number of pills, and the written instructions to make sure they are clear and accurate. Make sure that you are given enough medication to last until your next medication refill appointment. 5. Taking Medication: Take medication as prescribed. Never take more pills than instructed. Never take medication more frequently than prescribed. Taking less pills or less frequently is permitted and encouraged, when it comes to controlled substances (written prescriptions).  6. Inform other Doctors: Always inform, all of your healthcare providers, of all the medications you take. 7. Pain Medication from other Providers: You are not allowed to accept any additional pain medication from any other Doctor or Healthcare provider. There are two exceptions to this rule. (see below) In the event that you require additional pain medication, you are responsible for notifying us, as stated below. 8. Medication Agreement: You are responsible for carefully reading and following our Medication Agreement. This must be signed before receiving any prescriptions from our practice. Safely store a copy of your signed Agreement. Violations to the Agreement will result in no further prescriptions. (Additional copies of our Medication Agreement are available upon request.) 9. Laws, Rules, & Regulations: All patients are expected to follow all Federal and [employer], Rules, [employer]. Ignorance of the Laws does not constitute a valid excuse. The use of any  illegal substances is prohibited. 10. Adopted CDC guidelines & recommendations: Target dosing levels will be at or below 60 MME/day. Use of benzodiazepines** is not recommended.  Exceptions: There are only two exceptions to the rule of not receiving pain medications from other Healthcare Providers. 1. Exception #1 (Emergencies): In the event of an emergency (i.e.: accident requiring emergency care), you are allowed to receive additional pain medication. However, you are responsible for: As soon as you are able, call our office [phone], at any time of the day or night, and leave a message stating your name, the date and nature of the emergency, and the name  and dose of the medication prescribed. In the event that your call is answered by a member of our staff, make sure to document and save the date, time, and the name of the person that took your information.  2. Exception #2 (Planned Surgery): In the event that you are scheduled by another doctor or dentist to have any type of surgery or procedure, you are allowed (for a period no longer than 30 days), to receive additional pain medication, for the acute post-op pain. However, in this case, you are responsible for picking up a copy of our "Post-op Pain Management for Surgeons" handout, and giving it to your surgeon or dentist. This document is available at our office, and does not require an appointment to obtain it. Simply go to our office during business hours (Monday-Thursday from 8:00 AM to 4:00 PM) (Friday 8:00 AM to 12:00 Noon) or if you have a scheduled appointment with [place], prior to your surgery, and ask for it by name. In addition, you will need to provide [place] with your name, name of your surgeon, type of surgery, and date of procedure or surgery.  *Opioid medications include: morphine, codeine, oxycodone, oxymorphone, hydrocodone, hydromorphone, meperidine, tramadol, tapentadol, buprenorphine, fentanyl, methadone. **Benzodiazepine medications  include: diazepam (Valium), alprazolam (Xanax), clonazepam (Klonopine), lorazepam (Ativan), clorazepate (Tranxene), chlordiazepoxide (Librium), estazolam (Prosom), oxazepam (Serax), temazepam (Restoril), triazolam (Halcion)  ____________________________________________________________________________________________

## 2017-06-14 ENCOUNTER — Other Ambulatory Visit: Payer: Self-pay | Admitting: Nurse Practitioner

## 2017-06-14 ENCOUNTER — Telehealth: Payer: Self-pay | Admitting: Nurse Practitioner

## 2017-06-14 DIAGNOSIS — G8929 Other chronic pain: Secondary | ICD-10-CM | POA: Insufficient documentation

## 2017-06-14 DIAGNOSIS — M25511 Pain in right shoulder: Principal | ICD-10-CM

## 2017-06-14 DIAGNOSIS — M25519 Pain in unspecified shoulder: Secondary | ICD-10-CM

## 2017-06-14 DIAGNOSIS — M25512 Pain in left shoulder: Principal | ICD-10-CM

## 2017-06-14 NOTE — Telephone Encounter (Signed)
Patient called asking if you could ad bilateral shoulder blades to her orders for Xrays.

## 2017-06-14 NOTE — Telephone Encounter (Signed)
ordered

## 2017-06-22 ENCOUNTER — Ambulatory Visit
Admission: RE | Admit: 2017-06-22 | Discharge: 2017-06-22 | Disposition: A | Discharge status: Home / Self Care | Payer: Commercial Managed Care - PPO | Source: Ambulatory Visit | Attending: Nurse Practitioner | Admitting: Nurse Practitioner

## 2017-06-22 DIAGNOSIS — M25551 Pain in right hip: Secondary | ICD-10-CM | POA: Diagnosis not present

## 2017-06-22 DIAGNOSIS — G8929 Other chronic pain: Secondary | ICD-10-CM | POA: Diagnosis not present

## 2017-06-22 DIAGNOSIS — M25532 Pain in left wrist: Secondary | ICD-10-CM

## 2017-06-22 DIAGNOSIS — M25552 Pain in left hip: Secondary | ICD-10-CM | POA: Insufficient documentation

## 2017-06-22 DIAGNOSIS — M533 Sacrococcygeal disorders, not elsewhere classified: Secondary | ICD-10-CM | POA: Insufficient documentation

## 2017-06-22 DIAGNOSIS — M25511 Pain in right shoulder: Secondary | ICD-10-CM | POA: Insufficient documentation

## 2017-06-22 DIAGNOSIS — M25512 Pain in left shoulder: Secondary | ICD-10-CM | POA: Diagnosis present

## 2017-06-28 ENCOUNTER — Other Ambulatory Visit: Payer: Self-pay | Admitting: Nurse Practitioner

## 2017-06-28 DIAGNOSIS — M25511 Pain in right shoulder: Principal | ICD-10-CM

## 2017-06-28 DIAGNOSIS — G8929 Other chronic pain: Secondary | ICD-10-CM

## 2017-06-28 NOTE — Progress Notes (Signed)
Results were reviewed and found to be: abnormal  Further testing may be useful  MRI will verify possible impingement. MRI ordered for further evaluation. We will have to wait for insurance approval. The office will call you when approved to have this scheduled.   Please call our office if you have any additional questions.

## 2017-06-30 ENCOUNTER — Telehealth: Payer: Self-pay | Admitting: *Deleted

## 2017-06-30 NOTE — Telephone Encounter (Signed)
Patient requesting Lidoderm patch.  Patient denies ever having shingles or any post herpatic neuralgia.  Informed patient that it was difficult getting insurance approval for Lidoderm patches unless you had had these diagnosis.  Patient states understanding and states she will continue to use the over the counter patches.

## 2017-07-06 ENCOUNTER — Ambulatory Visit: Payer: Commercial Managed Care - PPO | Attending: Anesthesiology | Admitting: Anesthesiology

## 2017-07-06 ENCOUNTER — Encounter: Payer: Self-pay | Admitting: Anesthesiology

## 2017-07-06 DIAGNOSIS — M25511 Pain in right shoulder: Secondary | ICD-10-CM | POA: Diagnosis present

## 2017-07-06 DIAGNOSIS — Z79899 Other long term (current) drug therapy: Secondary | ICD-10-CM | POA: Diagnosis not present

## 2017-07-06 DIAGNOSIS — M25512 Pain in left shoulder: Secondary | ICD-10-CM | POA: Insufficient documentation

## 2017-07-06 DIAGNOSIS — M25552 Pain in left hip: Secondary | ICD-10-CM | POA: Diagnosis present

## 2017-07-06 DIAGNOSIS — G894 Chronic pain syndrome: Secondary | ICD-10-CM | POA: Diagnosis not present

## 2017-07-06 DIAGNOSIS — M25551 Pain in right hip: Secondary | ICD-10-CM | POA: Diagnosis not present

## 2017-07-06 MED ORDER — MORPHINE-NALTREXONE 20-0.8 MG PO CPCR: 1.0000 | ORAL_CAPSULE | Freq: Two times a day (BID) | ORAL | 0 refills | Status: AC

## 2017-07-06 MED ORDER — OXYCODONE-ACETAMINOPHEN 10-325 MG PO TABS: 1.0000 | ORAL_TABLET | Freq: Three times a day (TID) | ORAL | 0 refills | Status: AC | PRN

## 2017-07-06 MED ORDER — OXYCODONE-ACETAMINOPHEN 10-325 MG PO TABS: 1.0000 | ORAL_TABLET | Freq: Three times a day (TID) | ORAL | 0 refills | Status: DC | PRN

## 2017-07-06 NOTE — Patient Instructions (Signed)
Prescription given for Percocet x2.

## 2017-07-06 NOTE — Progress Notes (Signed)
Nursing Pain Medication Assessment:  Safety precautions to be maintained throughout the outpatient stay will include: orient to surroundings, keep bed in low position, maintain call bell within reach at all times, provide assistance with transfer out of bed and ambulation.  Medication Inspection Compliance: Pill count conducted under aseptic conditions, in front of the patient. Neither the pills nor the bottle was removed from the patient's sight at any time. Once count was completed pills were immediately returned to the patient in their original bottle.  Medication #1: Oxycodone IR Pill/Patch Count: 0 of 90 pills remain Pill/Patch Appearance: Markings consistent with prescribed medication Bottle Appearance: Standard pharmacy container. Clearly labeled. Filled Date: 10 / 08 / 2018 Last Medication intake:  Today  Medication #2: Embeda Pill/Patch Count: 4 of 30 pills remain Pill/Patch Appearance: Markings consistent with prescribed medication Bottle Appearance: Standard pharmacy container. Clearly labeled. Filled Date: 09/ 27 / 2018 Last Medication intake:  Today

## 2017-07-07 NOTE — Addendum Note (Signed)
Addended by: Molli Barrows on: 07/07/2017 02:36 PM   Modules accepted: Level of Service

## 2017-07-07 NOTE — Progress Notes (Signed)
Subjective:  Patient ID: Krista Kennedy, female    DOB: [birth year]  Age: 51 y.o. MRN: [id number]  CC: Hip Pain (left side) and Shoulder Pain (right side)   Procedure: None  HPI Krista Kennedy presents for reevaluation.  She was last seen a few months ago.  Krista Kennedy followed her and I have reviewed her last night.  Krista Kennedy presents with pain similar in quantity quality and distribution as previously documented.  She has failed conservative therapy and has also had some right posterior shoulder pain.  This is a type of pain with range of motion at the right shoulder joint.  It limits her ability to lift her arm and rotate the arm.  She is also having her standard distribution pain complaints as well.  This generally involves her hips shoulders and low back.  She takes her medications for this pain and continues to derive good functional lifestyle improvement with her medications.  Based on her narcotic assessment sheet she tolerates these well and continues to derive functional lifestyle improvement with them.  She has failed conservative therapy and been on these medications for an extended period of time.  No untoward side effects are described with these medicines.  Otherwise she is in her usual state of health at this time.  Outpatient Medications Prior to Visit  Medication Sig Dispense Refill  . Ascorbic Acid (VITAMIN C) 1000 MG tablet Take 1,000 mg by mouth daily.    . betamethasone dipropionate (DIPROLENE) 0.05 % ointment daily.   0  . carvedilol (COREG) 25 MG tablet Take 1 tablet (25 mg total) by mouth 2 (two) times daily with a meal. 180 tablet 3  . Cholecalciferol (VITAMIN D-1000 MAX ST) 1000 UNITS tablet Take by mouth daily.     . clobetasol (TEMOVATE) 0.05 % external solution Apply 1 application topically as needed.     . digoxin (LANOXIN) 0.25 MG tablet Take 1 tablet (0.25 mg total) by mouth daily. 90 tablet 3  . fluocinonide (LIDEX) 0.05 % external solution Apply 1 application  topically daily.   0  . fluticasone (FLONASE) 50 MCG/ACT nasal spray Place 2 sprays into both nostrils daily. 16 g 3  . Ixekizumab (TALTZ) 80 MG/ML SOSY Inject 80 mg into the skin as directed.    Marland Kitchen levothyroxine (SYNTHROID, LEVOTHROID) 125 MCG tablet Take 1 tablet (125 mcg total) by mouth daily. 90 tablet 3  . lisinopril (PRINIVIL,ZESTRIL) 5 MG tablet Take 1 tablet (5 mg total) by mouth daily. 90 tablet 3  . venlafaxine XR (EFFEXOR-XR) 75 MG 24 hr capsule TAKE ONE CAPSULE BY MOUTH ONCE DAILY WITH BREAKFAST 90 capsule 3  . Morphine-Naltrexone (EMBEDA) 20-0.8 MG CPCR Take 1 capsule by mouth 2 (two) times daily. 60 capsule 0  . oxyCODONE-acetaminophen (PERCOCET) 10-325 MG tablet Take 1 tablet by mouth every 8 (eight) hours as needed for pain. 90 tablet 0  . furosemide (LASIX) 40 MG tablet Take 40 mg by mouth daily as needed. Reported on 01/13/2016    . ipratropium (ATROVENT) 0.06 % nasal spray Place 2 sprays into both nostrils 4 (four) times daily. For up to 5-7 days then stop. (Patient not taking: Reported on 07/06/2017) 15 mL 0  . meloxicam (MOBIC) 7.5 MG tablet Take by mouth daily.     . naloxone (NARCAN) nasal spray 4 mg/0.1 mL To reverse excess sedation from narcotics (Patient not taking: Reported on 07/06/2017) 2 kit 1  . TALTZ 80 MG/ML SOAJ      No facility-administered medications  prior to visit.     Review of Systems CNS: No sedation or confusion Cardiac: No angina or palpitations GI: No constipation or abdominal pain  Objective:  BP 126/72 (BP Location: Left Arm, Patient Position: Sitting, Cuff Size: Normal)   Pulse 84   Temp 98.6 F (37 C)   Resp 16   Ht '5\' 2"'$  (1.575 m)   Wt 160 lb (72.6 kg)   SpO2 100%   BMI 29.26 kg/m    BP Readings from Last 3 Encounters:  07/06/17 126/72  06/12/17 (!) 156/90  05/24/17 132/79     Wt Readings from Last 3 Encounters:  07/06/17 160 lb (72.6 kg)  06/12/17 160 lb (72.6 kg)  05/24/17 160 lb (72.6 kg)     Physical Exam Pt is alert  and oriented PERRL EOMI HEART IS RRR no murmur or rub LCTA no wheezing or rhales MUSCULOSKELETAL right shoulder reveals pain with external rotation and internal rotation primarily in the right posterior scapular region.  She has tenderness over the medial aspect of the right scapula on her back.  Her strength in the upper extremities appears to be well preserved.  Labs  Lab Results  Component Value Date   HGBA1C 5.9 (H) 04/11/2017   Lab Results  Component Value Date   LDLCALC 129 (H) 04/11/2017   CREATININE 0.85 04/11/2017    -------------------------------------------------------------------------------------------------------------------- Lab Results  Component Value Date   WBC 6.2 04/11/2017   HGB 13.7 04/11/2017   HCT 42.0 04/11/2017   PLT 217 04/11/2017   GLUCOSE 104 (H) 04/11/2017   CHOL 223 (H) 04/11/2017   TRIG 98 04/11/2017   HDL 74 04/11/2017   LDLCALC 129 (H) 04/11/2017   ALT 13 04/11/2017   AST 17 04/11/2017   NA 139 04/11/2017   K 3.9 04/11/2017   CL 104 04/11/2017   CREATININE 0.85 04/11/2017   BUN 12 04/11/2017   CO2 26 04/11/2017   TSH 1.57 04/11/2017   HGBA1C 5.9 (H) 04/11/2017    --------------------------------------------------------------------------------------------------------------------- Dg Si Joints  Result Date: 06/22/2017 CLINICAL DATA:  Chronic pain without injury. EXAM: BILATERAL SACROILIAC JOINTS - 3+ VIEW COMPARISON:  None. FINDINGS: The sacroiliac joint spaces are maintained and there is no evidence of arthropathy. No other bone abnormalities are seen. IMPRESSION: Negative. Electronically Signed   By: Staci Righter M.D.   On: 06/22/2017 14:19   Dg Shoulder Right  Result Date: 06/22/2017 CLINICAL DATA:  Chronic right shoulder pain EXAM: RIGHT SHOULDER - 2+ VIEW COMPARISON:  None. FINDINGS: The right humeral head is normally situated in the glenohumeral joint appears normal. Mild acromial spurring is present which can result in  impingement. The right [facility] joint is normally aligned. No acute abnormality is seen. IMPRESSION: No acute abnormality. Possible impingement with an acromial spur present. Electronically Signed   By: Ivar Drape M.D.   On: 06/22/2017 14:15   Dg Wrist 2 Views Left  Result Date: 06/22/2017 CLINICAL DATA:  Chronic pain.  No injury. EXAM: LEFT WRIST - 2 VIEW COMPARISON:  No recent. FINDINGS: No acute bony or joint abnormality identified. No evidence of fracture or dislocation. IMPRESSION: No acute abnormality. Electronically Signed   By: Marcello Moores  Register   On: 06/22/2017 14:16   Dg Shoulder Left  Result Date: 06/22/2017 CLINICAL DATA:  Left shoulder pain, no injury EXAM: LEFT SHOULDER - 2+ VIEW COMPARISON:  None. FINDINGS: The left humeral head is in normal position and the left glenohumeral joint space appears normal. The left AC joint is normally  aligned. No significant abnormality is seen. IMPRESSION: Negative. Electronically Signed   By: Ivar Drape M.D.   On: 06/22/2017 14:16   Dg Hip Unilat W Or W/o Pelvis 2-3 Views Left  Result Date: 06/22/2017 CLINICAL DATA:  Chronic bilateral hip pain without known injury. EXAM: DG HIP (WITH OR WITHOUT PELVIS) 2-3V LEFT COMPARISON:  None. FINDINGS: There is no evidence of hip fracture or dislocation. There is no evidence of arthropathy or other focal bone abnormality. IMPRESSION: Normal left hip. Electronically Signed   By: Marijo Conception, M.D.   On: 06/22/2017 14:14   Dg Hip Unilat W Or W/o Pelvis 2-3 Views Right  Result Date: 06/22/2017 CLINICAL DATA:  Chronic pain without injury. EXAM: DG HIP (WITH OR WITHOUT PELVIS) 2-3V RIGHT COMPARISON:  None. FINDINGS: There is no evidence of hip fracture or dislocation. There is no evidence of arthropathy or other focal bone abnormality. BILATERAL tubal ligation clips are incidentally noted. IMPRESSION: Negative. Electronically Signed   By: Staci Righter M.D.   On: 06/22/2017 14:13     Assessment & Plan:   Krista Kennedy  was seen today for hip pain and shoulder pain.  Diagnoses and all orders for this visit:  Chronic pain syndrome -     Discontinue: oxyCODONE-acetaminophen (PERCOCET) 10-325 MG tablet; Take 1 tablet by mouth every 8 (eight) hours as needed for pain. -     oxyCODONE-acetaminophen (PERCOCET) 10-325 MG tablet; Take 1 tablet by mouth every 8 (eight) hours as needed for pain. -     Ambulatory referral to Orthopedic Surgery  Other orders -     Morphine-Naltrexone (EMBEDA) 20-0.8 MG CPCR; Take 1 capsule by mouth 2 (two) times daily.        ----------------------------------------------------------------------------------------------------------------------  Problem List Items Addressed This Visit      Unprioritized   Chronic pain syndrome (Chronic)   Relevant Medications   oxyCODONE-acetaminophen (PERCOCET) 10-325 MG tablet   Other Relevant Orders   Ambulatory referral to Orthopedic Surgery        ----------------------------------------------------------------------------------------------------------------------  1. Chronic pain syndrome We will refill her medications today for her oxycodone for the next 2 months and her Embeda as well.  She is to return to clinic in 2 months.  We have reviewed her narcotic assessment sheet and the practitioner database information for the state of [place] and it is appropriate. - oxyCODONE-acetaminophen (PERCOCET) 10-325 MG tablet; Take 1 tablet by mouth every 8 (eight) hours as needed for pain.  Dispense: 90 tablet; Refill: 0 - Ambulatory referral to Orthopedic Surgery  2.  Right rotator cuff pain.  Going to refer her to orthopedics for evaluation and possible  injection.  I talked her about some stretching strengthening exercises for her right rotator cuff pain.    ----------------------------------------------------------------------------------------------------------------------  I am having Krista Kennedy maintain her furosemide,  vitamin C, Cholecalciferol, meloxicam, betamethasone dipropionate, fluocinonide, Ixekizumab, naloxone, fluticasone, ipratropium, venlafaxine XR, clobetasol, TALTZ, carvedilol, digoxin, lisinopril, levothyroxine, oxyCODONE-acetaminophen, and Morphine-Naltrexone.   Meds ordered this encounter  Medications  . DISCONTD: oxyCODONE-acetaminophen (PERCOCET) 10-325 MG tablet    Sig: Take 1 tablet by mouth every 8 (eight) hours as needed for pain.    Dispense:  90 tablet    Refill:  0    Do not place this medication, or any other prescription from our practice, on "Automatic Refill". Patient may have prescription filled one day early if pharmacy is closed on scheduled refill date. Do not fill until: [id number]  . oxyCODONE-acetaminophen (PERCOCET) 10-325 MG tablet  Sig: Take 1 tablet by mouth every 8 (eight) hours as needed for pain.    Dispense:  90 tablet    Refill:  0    Do not place this medication, or any other prescription from our practice, on "Automatic Refill". Patient may have prescription filled one day early if pharmacy is closed on scheduled refill date. Do not fill until: [id number]  . Morphine-Naltrexone (EMBEDA) 20-0.8 MG CPCR    Sig: Take 1 capsule by mouth 2 (two) times daily.    Dispense:  60 capsule    Refill:  0    May fill 07/25/2017    Order Specific Question:   Supervising Provider    Answer:   Milinda Pointer [phone]   Patient's Medications  New Prescriptions   No medications on file  Previous Medications   ASCORBIC ACID (VITAMIN C) 1000 MG TABLET    Take 1,000 mg by mouth daily.   BETAMETHASONE DIPROPIONATE (DIPROLENE) 0.05 % OINTMENT    daily.    CARVEDILOL (COREG) 25 MG TABLET    Take 1 tablet (25 mg total) by mouth 2 (two) times daily with a meal.   CHOLECALCIFEROL (VITAMIN D-1000 MAX ST) 1000 UNITS TABLET    Take by mouth daily.    CLOBETASOL (TEMOVATE) 0.05 % EXTERNAL SOLUTION    Apply 1 application topically as needed.    DIGOXIN (LANOXIN) 0.25 MG TABLET     Take 1 tablet (0.25 mg total) by mouth daily.   FLUOCINONIDE (LIDEX) 0.05 % EXTERNAL SOLUTION    Apply 1 application topically daily.    FLUTICASONE (FLONASE) 50 MCG/ACT NASAL SPRAY    Place 2 sprays into both nostrils daily.   FUROSEMIDE (LASIX) 40 MG TABLET    Take 40 mg by mouth daily as needed. Reported on 01/13/2016   IPRATROPIUM (ATROVENT) 0.06 % NASAL SPRAY    Place 2 sprays into both nostrils 4 (four) times daily. For up to 5-7 days then stop.   IXEKIZUMAB (TALTZ) 80 MG/ML SOSY    Inject 80 mg into the skin as directed.   LEVOTHYROXINE (SYNTHROID, LEVOTHROID) 125 MCG TABLET    Take 1 tablet (125 mcg total) by mouth daily.   LISINOPRIL (PRINIVIL,ZESTRIL) 5 MG TABLET    Take 1 tablet (5 mg total) by mouth daily.   MELOXICAM (MOBIC) 7.5 MG TABLET    Take by mouth daily.    NALOXONE (NARCAN) NASAL SPRAY 4 MG/0.1 ML    To reverse excess sedation from narcotics   TALTZ 80 MG/ML SOAJ       VENLAFAXINE XR (EFFEXOR-XR) 75 MG 24 HR CAPSULE    TAKE ONE CAPSULE BY MOUTH ONCE DAILY WITH BREAKFAST  Modified Medications   Modified Medication Previous Medication   MORPHINE-NALTREXONE (EMBEDA) 20-0.8 MG CPCR Morphine-Naltrexone (EMBEDA) 20-0.8 MG CPCR      Take 1 capsule by mouth 2 (two) times daily.    Take 1 capsule by mouth 2 (two) times daily.   OXYCODONE-ACETAMINOPHEN (PERCOCET) 10-325 MG TABLET oxyCODONE-acetaminophen (PERCOCET) 10-325 MG tablet      Take 1 tablet by mouth every 8 (eight) hours as needed for pain.    Take 1 tablet by mouth every 8 (eight) hours as needed for pain.  Discontinued Medications   No medications on file   ----------------------------------------------------------------------------------------------------------------------  Follow-up: Return in about 2 months (around 09/05/2017) for evaluation.    Molli Barrows, MD

## 2017-07-26 ENCOUNTER — Telehealth: Payer: Self-pay | Admitting: Nurse Practitioner

## 2017-07-26 ENCOUNTER — Other Ambulatory Visit: Payer: Self-pay | Admitting: Family Medicine

## 2017-07-26 DIAGNOSIS — I5022 Chronic systolic (congestive) heart failure: Secondary | ICD-10-CM

## 2017-07-26 NOTE — Telephone Encounter (Signed)
Pt needs a refill on coreg sent to [facility] in [place].  Her call back number is [phone]

## 2017-09-06 ENCOUNTER — Ambulatory Visit: Payer: Commercial Managed Care - PPO | Attending: Anesthesiology | Admitting: Anesthesiology

## 2017-09-06 ENCOUNTER — Other Ambulatory Visit: Payer: Self-pay

## 2017-09-06 ENCOUNTER — Encounter: Payer: Self-pay | Admitting: Anesthesiology

## 2017-09-06 VITALS — BP 156/89 | HR 95 | Temp 98.0°F | Resp 18 | Ht 70.0 in | Wt 160.0 lb

## 2017-09-06 DIAGNOSIS — L4050 Arthropathic psoriasis, unspecified: Secondary | ICD-10-CM

## 2017-09-06 DIAGNOSIS — M25532 Pain in left wrist: Secondary | ICD-10-CM

## 2017-09-06 DIAGNOSIS — M533 Sacrococcygeal disorders, not elsewhere classified: Secondary | ICD-10-CM | POA: Diagnosis not present

## 2017-09-06 DIAGNOSIS — M25552 Pain in left hip: Secondary | ICD-10-CM | POA: Diagnosis not present

## 2017-09-06 DIAGNOSIS — M5441 Lumbago with sciatica, right side: Secondary | ICD-10-CM | POA: Diagnosis not present

## 2017-09-06 DIAGNOSIS — F1190 Opioid use, unspecified, uncomplicated: Secondary | ICD-10-CM

## 2017-09-06 DIAGNOSIS — M25549 Pain in joints of unspecified hand: Secondary | ICD-10-CM | POA: Diagnosis not present

## 2017-09-06 DIAGNOSIS — G894 Chronic pain syndrome: Secondary | ICD-10-CM

## 2017-09-06 DIAGNOSIS — Z79891 Long term (current) use of opiate analgesic: Secondary | ICD-10-CM | POA: Diagnosis not present

## 2017-09-06 DIAGNOSIS — M5442 Lumbago with sciatica, left side: Secondary | ICD-10-CM | POA: Diagnosis not present

## 2017-09-06 DIAGNOSIS — G8929 Other chronic pain: Secondary | ICD-10-CM

## 2017-09-06 MED ORDER — OXYCODONE-ACETAMINOPHEN 10-325 MG PO TABS: 1.0000 | ORAL_TABLET | Freq: Three times a day (TID) | ORAL | 0 refills | Status: AC | PRN

## 2017-09-06 MED ORDER — MORPHINE-NALTREXONE 20-0.8 MG PO CPCR: 1.0000 | ORAL_CAPSULE | Freq: Two times a day (BID) | ORAL | 0 refills | Status: DC

## 2017-09-06 MED ORDER — MORPHINE-NALTREXONE 20-0.8 MG PO CPCR: 1.0000 | ORAL_CAPSULE | Freq: Two times a day (BID) | ORAL | 0 refills | Status: AC

## 2017-09-06 MED ORDER — OXYCODONE-ACETAMINOPHEN 10-325 MG PO TABS: 1.0000 | ORAL_TABLET | Freq: Three times a day (TID) | ORAL | 0 refills | Status: DC | PRN

## 2017-09-06 NOTE — Progress Notes (Signed)
Nursing Pain Medication Assessment:  Safety precautions to be maintained throughout the outpatient stay will include: orient to surroundings, keep bed in low position, maintain call bell within reach at all times, provide assistance with transfer out of bed and ambulation.  Medication Inspection Compliance: Krista Kennedy did not comply with our request to bring her pills to be counted. She was reminded that bringing the medication bottles, even when empty, is a requirement.  Medication: None brought in. Pill/Patch Count: None available to be counted. Bottle Appearance: No container available. Did not bring bottle(s) to appointment. Filled Date: N/A Last Medication intake:  Today

## 2017-09-06 NOTE — Patient Instructions (Addendum)
Prescriptions given for percocet and Morphine-naltretone with instructions x2

## 2017-09-07 NOTE — Progress Notes (Signed)
Subjective:  Patient ID: Krista Kennedy, female    DOB: [birth year]  Age: 52 y.o. MRN: [id number]  CC: Hip Pain (left)   Procedure: None  HPI Krista Kennedy presents for reevaluation.  She was last seen a few months ago and has been taking her medications as prescribed for her chronic psoriatic arthritis symptoms.  She continues to have diffuse pain in her hands feet knees back of the same quality characteristic and distribution as per her initial presentation.  This occasionally affects one hip more than the other or one shoulder more than the other but generally it continues at its baseline presentation without significant change.  She is taking her medications as prescribed and these seem to continue to help her.  She takes Embeda 1 tablet twice daily and oxycodone 10 mg tablets 3 times daily.  This regimen seems to be working well based on her narcotic assessment sheet.  She continues to maintain that she derives good functional lifestyle improvement with the medications as compared to other more conservative therapies.  Minimal side effects are revealed today during the visit.  Outpatient Medications Prior to Visit  Medication Sig Dispense Refill  . Ascorbic Acid (VITAMIN C) 1000 MG tablet Take 1,000 mg by mouth daily.    . betamethasone dipropionate (DIPROLENE) 0.05 % ointment daily.   0  . carvedilol (COREG) 25 MG tablet Take 1 tablet (25 mg total) by mouth 2 (two) times daily with a meal. 180 tablet 3  . Cholecalciferol (VITAMIN D-1000 MAX ST) 1000 UNITS tablet Take by mouth daily.     . clobetasol (TEMOVATE) 0.05 % external solution Apply 1 application topically as needed.     . digoxin (LANOXIN) 0.25 MG tablet Take 1 tablet (0.25 mg total) by mouth daily. 90 tablet 3  . fluticasone (FLONASE) 50 MCG/ACT nasal spray Place 2 sprays into both nostrils daily. 16 g 3  . Ixekizumab (TALTZ) 80 MG/ML SOSY Inject 80 mg into the skin as directed.    Marland Kitchen levothyroxine (SYNTHROID, LEVOTHROID) 125 MCG  tablet Take 1 tablet (125 mcg total) by mouth daily. 90 tablet 3  . lisinopril (PRINIVIL,ZESTRIL) 5 MG tablet Take 1 tablet (5 mg total) by mouth daily. 90 tablet 3  . naloxone (NARCAN) nasal spray 4 mg/0.1 mL To reverse excess sedation from narcotics 2 kit 1  . venlafaxine XR (EFFEXOR-XR) 75 MG 24 hr capsule TAKE ONE CAPSULE BY MOUTH ONCE DAILY WITH BREAKFAST 90 capsule 3  . carvedilol (COREG) 25 MG tablet TAKE ONE TABLET BY MOUTH TWICE DAILY WITH A MEAL (Patient not taking: Reported on 09/06/2017) 60 tablet 11  . fluocinonide (LIDEX) 0.05 % external solution Apply 1 application topically daily.   0  . furosemide (LASIX) 40 MG tablet Take 40 mg by mouth daily as needed. Reported on 01/13/2016    . ipratropium (ATROVENT) 0.06 % nasal spray Place 2 sprays into both nostrils 4 (four) times daily. For up to 5-7 days then stop. (Patient not taking: Reported on 07/06/2017) 15 mL 0  . meloxicam (MOBIC) 7.5 MG tablet Take by mouth daily.     Marland Kitchen TALTZ 80 MG/ML SOAJ      No facility-administered medications prior to visit.     Review of Systems CNS: No confusion or sedation Cardiac: No angina or palpitations GI: No abdominal pain or constipation   Objective:  BP (!) 156/89   Pulse 95   Temp 98 F (36.7 C)   Resp 18   Ht _0  (1.778  m)   Wt 160 lb (72.6 kg)   SpO2 99%   BMI 22.96 kg/m    BP Readings from Last 3 Encounters:  09/06/17 (!) 156/89  07/06/17 126/72  06/12/17 (!) 156/90     Wt Readings from Last 3 Encounters:  09/06/17 160 lb (72.6 kg)  07/06/17 160 lb (72.6 kg)  06/12/17 160 lb (72.6 kg)     Physical Exam Pt is alert and oriented PERRL EOMI HEART IS RRR no murmur or rub LCTA no wheezing or rales MUSCULOSKELETAL reveals some tenderness in the lumbar paraspinous muscles but no overt trigger points.  She moves about the room with some mild difficulty but seems to be ambulating relatively well and her strength appears to be well preserved.  Her muscle tone and bulk to  the lower extremities is well-maintained.  Labs  Lab Results  Component Value Date   HGBA1C 5.9 (H) 04/11/2017   Lab Results  Component Value Date   LDLCALC 129 (H) 04/11/2017   CREATININE 0.85 04/11/2017    -------------------------------------------------------------------------------------------------------------------- Lab Results  Component Value Date   WBC 6.2 04/11/2017   HGB 13.7 04/11/2017   HCT 42.0 04/11/2017   PLT 217 04/11/2017   GLUCOSE 104 (H) 04/11/2017   CHOL 223 (H) 04/11/2017   TRIG 98 04/11/2017   HDL 74 04/11/2017   LDLCALC 129 (H) 04/11/2017   ALT 13 04/11/2017   AST 17 04/11/2017   NA 139 04/11/2017   K 3.9 04/11/2017   CL 104 04/11/2017   CREATININE 0.85 04/11/2017   BUN 12 04/11/2017   CO2 26 04/11/2017   TSH 1.57 04/11/2017   HGBA1C 5.9 (H) 04/11/2017    --------------------------------------------------------------------------------------------------------------------- Dg Si Joints  Result Date: 06/22/2017 CLINICAL DATA:  Chronic pain without injury. EXAM: BILATERAL SACROILIAC JOINTS - 3+ VIEW COMPARISON:  None. FINDINGS: The sacroiliac joint spaces are maintained and there is no evidence of arthropathy. No other bone abnormalities are seen. IMPRESSION: Negative. Electronically Signed   By: Staci Righter M.D.   On: 06/22/2017 14:19   Dg Shoulder Right  Result Date: 06/22/2017 CLINICAL DATA:  Chronic right shoulder pain EXAM: RIGHT SHOULDER - 2+ VIEW COMPARISON:  None. FINDINGS: The right humeral head is normally situated in the glenohumeral joint appears normal. Mild acromial spurring is present which can result in impingement. The right [facility] joint is normally aligned. No acute abnormality is seen. IMPRESSION: No acute abnormality. Possible impingement with an acromial spur present. Electronically Signed   By: Ivar Drape M.D.   On: 06/22/2017 14:15   Dg Wrist 2 Views Left  Result Date: 06/22/2017 CLINICAL DATA:  Chronic pain.  No injury.  EXAM: LEFT WRIST - 2 VIEW COMPARISON:  No recent. FINDINGS: No acute bony or joint abnormality identified. No evidence of fracture or dislocation. IMPRESSION: No acute abnormality. Electronically Signed   By: Marcello Moores  Register   On: 06/22/2017 14:16   Dg Shoulder Left  Result Date: 06/22/2017 CLINICAL DATA:  Left shoulder pain, no injury EXAM: LEFT SHOULDER - 2+ VIEW COMPARISON:  None. FINDINGS: The left humeral head is in normal position and the left glenohumeral joint space appears normal. The left AC joint is normally aligned. No significant abnormality is seen. IMPRESSION: Negative. Electronically Signed   By: Ivar Drape M.D.   On: 06/22/2017 14:16   Dg Hip Unilat W Or W/o Pelvis 2-3 Views Left  Result Date: 06/22/2017 CLINICAL DATA:  Chronic bilateral hip pain without known injury. EXAM: DG HIP (WITH OR WITHOUT PELVIS)  2-3V LEFT COMPARISON:  None. FINDINGS: There is no evidence of hip fracture or dislocation. There is no evidence of arthropathy or other focal bone abnormality. IMPRESSION: Normal left hip. Electronically Signed   By: Marijo Conception, M.D.   On: 06/22/2017 14:14   Dg Hip Unilat W Or W/o Pelvis 2-3 Views Right  Result Date: 06/22/2017 CLINICAL DATA:  Chronic pain without injury. EXAM: DG HIP (WITH OR WITHOUT PELVIS) 2-3V RIGHT COMPARISON:  None. FINDINGS: There is no evidence of hip fracture or dislocation. There is no evidence of arthropathy or other focal bone abnormality. BILATERAL tubal ligation clips are incidentally noted. IMPRESSION: Negative. Electronically Signed   By: Staci Righter M.D.   On: 06/22/2017 14:13     Assessment & Plan:   Krista Kennedy was seen today for hip pain.  Diagnoses and all orders for this visit:  Chronic pain syndrome  Chronic left hip pain  Chronic bilateral low back pain with bilateral sciatica  Chronic sacroiliac joint pain  Left wrist pain  Psoriasis with arthropathy (HCC)  Psoriatic arthritis (HCC)  Pain in multiple finger  joints  Chronic, continuous use of opioids -     ToxASSURE Select 13 (MW), Urine  Other orders -     Discontinue: Morphine-Naltrexone (EMBEDA) 20-0.8 MG CPCR; Take 1 capsule by mouth 2 (two) times daily. -     Morphine-Naltrexone (EMBEDA) 20-0.8 MG CPCR; Take 1 capsule by mouth 2 (two) times daily. -     Discontinue: oxyCODONE-acetaminophen (PERCOCET) 10-325 MG tablet; Take 1 tablet by mouth every 8 (eight) hours as needed for pain. -     oxyCODONE-acetaminophen (PERCOCET) 10-325 MG tablet; Take 1 tablet by mouth every 8 (eight) hours as needed for pain.        ----------------------------------------------------------------------------------------------------------------------  Problem List Items Addressed This Visit      Unprioritized   Chronic hip pain (Chronic)   Relevant Medications   Morphine-Naltrexone (EMBEDA) 20-0.8 MG CPCR   oxyCODONE-acetaminophen (PERCOCET) 10-325 MG tablet   Chronic low back pain (Chronic)   Relevant Medications   Morphine-Naltrexone (EMBEDA) 20-0.8 MG CPCR   oxyCODONE-acetaminophen (PERCOCET) 10-325 MG tablet   Chronic pain syndrome - Primary (Chronic)   Chronic sacroiliac joint pain (Chronic)   Relevant Medications   Morphine-Naltrexone (EMBEDA) 20-0.8 MG CPCR   oxyCODONE-acetaminophen (PERCOCET) 10-325 MG tablet   Left wrist pain (Chronic)   Pain in multiple finger joints   Psoriasis with arthropathy (HCC)    Other Visit Diagnoses    Psoriatic arthritis ([place])       Chronic, continuous use of opioids       Relevant Orders   ToxASSURE Select 13 (MW), Urine        ----------------------------------------------------------------------------------------------------------------------  1. Chronic pain syndrome We reviewed the [facility] practitioner database information and it is appropriate.  As such we will provide refills for her Embeda dated January 2 and February 1 and her oxycodone 10 mg tablets for January 5 and February 4.  We  talked about the risks and benefits of chronic opioid therapy.  Unfortunately she has failed conservative therapy and this regimen continues to work well for her with minimal side effects.  She is to return to clinic in 2 months.  2. Chronic left hip pain Continue follow-up with her rheumatology physicians and primary care physicians for her baseline medical problems.  3. Chronic bilateral low back pain with bilateral sciatica As above  4. Chronic sacroiliac joint pain   5. Left wrist pain   6.  Psoriasis with arthropathy ([place])   7. Psoriatic arthritis ([place])   8. Pain in multiple finger joints   9. Chronic, continuous use of opioids Repeat UDS was requested today as per schedule - ToxASSURE Select 13 (MW), Urine    ----------------------------------------------------------------------------------------------------------------------  I am having Krista Kennedy maintain her furosemide, vitamin C, Cholecalciferol, meloxicam, betamethasone dipropionate, fluocinonide, Ixekizumab, naloxone, fluticasone, ipratropium, venlafaxine XR, clobetasol, TALTZ, carvedilol, digoxin, lisinopril, levothyroxine, carvedilol, Morphine-Naltrexone, and oxyCODONE-acetaminophen.   Meds ordered this encounter  Medications  . DISCONTD: Morphine-Naltrexone (EMBEDA) 20-0.8 MG CPCR    Sig: Take 1 capsule by mouth 2 (two) times daily.    Dispense:  60 capsule    Refill:  0  . Morphine-Naltrexone (EMBEDA) 20-0.8 MG CPCR    Sig: Take 1 capsule by mouth 2 (two) times daily.    Dispense:  60 capsule    Refill:  0    Do not fill until 2012019  . DISCONTD: oxyCODONE-acetaminophen (PERCOCET) 10-325 MG tablet    Sig: Take 1 tablet by mouth every 8 (eight) hours as needed for pain.    Dispense:  90 tablet    Refill:  0    Do not fill until 2330076  . oxyCODONE-acetaminophen (PERCOCET) 10-325 MG tablet    Sig: Take 1 tablet by mouth every 8 (eight) hours as needed for pain.    Dispense:  90 tablet    Refill:   0    Do not fill until 2042019     Medication List        Accurate as of 09/06/17 11:59 PM. Always use your most recent med list.          betamethasone dipropionate 0.05 % ointment Commonly known as:  DIPROLENE   * carvedilol 25 MG tablet Commonly known as:  COREG Take 1 tablet (25 mg total) by mouth 2 (two) times daily with a meal.   * carvedilol 25 MG tablet Commonly known as:  COREG TAKE ONE TABLET BY MOUTH TWICE DAILY WITH A MEAL   clobetasol 0.05 % external solution Commonly known as:  TEMOVATE   digoxin 0.25 MG tablet Commonly known as:  LANOXIN Take 1 tablet (0.25 mg total) by mouth daily.   fluocinonide 0.05 % external solution Commonly known as:  LIDEX   fluticasone 50 MCG/ACT nasal spray Commonly known as:  FLONASE Place 2 sprays into both nostrils daily.   furosemide 40 MG tablet Commonly known as:  LASIX   ipratropium 0.06 % nasal spray Commonly known as:  ATROVENT Place 2 sprays into both nostrils 4 (four) times daily. For up to 5-7 days then stop.   levothyroxine 125 MCG tablet Commonly known as:  SYNTHROID, LEVOTHROID Take 1 tablet (125 mcg total) by mouth daily.   lisinopril 5 MG tablet Commonly known as:  PRINIVIL,ZESTRIL Take 1 tablet (5 mg total) by mouth daily.   meloxicam 7.5 MG tablet Commonly known as:  MOBIC   Morphine-Naltrexone 20-0.8 MG Cpcr Commonly known as:  EMBEDA Take 1 capsule by mouth 2 (two) times daily.   naloxone 4 MG/0.1ML Liqd nasal spray kit Commonly known as:  NARCAN To reverse excess sedation from narcotics   oxyCODONE-acetaminophen 10-325 MG tablet Commonly known as:  PERCOCET Take 1 tablet by mouth every 8 (eight) hours as needed for pain.   * TALTZ 80 MG/ML Sosy Generic drug:  Ixekizumab   * TALTZ 80 MG/ML Soaj Generic drug:  Ixekizumab   venlafaxine XR 75 MG 24 hr capsule Commonly known as:  EFFEXOR-XR  TAKE ONE CAPSULE BY MOUTH ONCE DAILY WITH BREAKFAST   vitamin C 1000 MG tablet   VITAMIN  D-1000 MAX ST 1000 units tablet Generic drug:  Cholecalciferol      * This list has 4 medication(s) that are the same as other medications prescribed for you. Read the directions carefully, and ask your doctor or other care provider to review them with you.          Where to Get Your Medications    You can get these medications from any pharmacy   Bring a paper prescription for each of these medications  Morphine-Naltrexone 20-0.8 MG Cpcr  oxyCODONE-acetaminophen 10-325 MG tablet    ----------------------------------------------------------------------------------------------------------------------  Follow-up: Return in about 2 months (around 11/04/2017) for evaluation, med refill.    Molli Barrows, MD

## 2017-09-12 LAB — TOXASSURE SELECT 13 (MW), URINE

## 2017-10-13 ENCOUNTER — Other Ambulatory Visit: Payer: Self-pay | Admitting: Family Medicine

## 2017-10-13 ENCOUNTER — Telehealth: Payer: Self-pay | Admitting: Nurse Practitioner

## 2017-10-13 DIAGNOSIS — I10 Essential (primary) hypertension: Secondary | ICD-10-CM

## 2017-10-13 DIAGNOSIS — E079 Disorder of thyroid, unspecified: Secondary | ICD-10-CM

## 2017-10-13 NOTE — Telephone Encounter (Signed)
Refilled meds  Nobie Putnam, DO [facility] 10/13/2017, 1:13 PM

## 2017-10-13 NOTE — Telephone Encounter (Signed)
Pt needs refills on lisinopril and levothyroxine sent to [facility] [phone]

## 2017-11-02 ENCOUNTER — Other Ambulatory Visit: Payer: Self-pay

## 2017-11-02 ENCOUNTER — Ambulatory Visit: Payer: Commercial Managed Care - PPO | Attending: Anesthesiology | Admitting: Anesthesiology

## 2017-11-02 ENCOUNTER — Encounter: Payer: Self-pay | Admitting: Anesthesiology

## 2017-11-02 VITALS — BP 146/89 | HR 92 | Temp 98.0°F | Resp 16 | Ht 70.0 in | Wt 160.0 lb

## 2017-11-02 DIAGNOSIS — M25552 Pain in left hip: Secondary | ICD-10-CM

## 2017-11-02 DIAGNOSIS — L4050 Arthropathic psoriasis, unspecified: Secondary | ICD-10-CM | POA: Diagnosis not present

## 2017-11-02 DIAGNOSIS — G8929 Other chronic pain: Secondary | ICD-10-CM

## 2017-11-02 DIAGNOSIS — M5441 Lumbago with sciatica, right side: Secondary | ICD-10-CM

## 2017-11-02 DIAGNOSIS — G894 Chronic pain syndrome: Secondary | ICD-10-CM | POA: Diagnosis not present

## 2017-11-02 DIAGNOSIS — M25551 Pain in right hip: Secondary | ICD-10-CM | POA: Insufficient documentation

## 2017-11-02 DIAGNOSIS — M5442 Lumbago with sciatica, left side: Secondary | ICD-10-CM | POA: Diagnosis not present

## 2017-11-02 DIAGNOSIS — F1190 Opioid use, unspecified, uncomplicated: Secondary | ICD-10-CM

## 2017-11-02 DIAGNOSIS — Z79891 Long term (current) use of opiate analgesic: Secondary | ICD-10-CM | POA: Diagnosis not present

## 2017-11-02 DIAGNOSIS — Z79899 Other long term (current) drug therapy: Secondary | ICD-10-CM | POA: Diagnosis not present

## 2017-11-02 DIAGNOSIS — M25549 Pain in joints of unspecified hand: Secondary | ICD-10-CM | POA: Diagnosis not present

## 2017-11-02 DIAGNOSIS — M533 Sacrococcygeal disorders, not elsewhere classified: Secondary | ICD-10-CM

## 2017-11-02 DIAGNOSIS — M25532 Pain in left wrist: Secondary | ICD-10-CM | POA: Diagnosis not present

## 2017-11-02 MED ORDER — MORPHINE-NALTREXONE 20-0.8 MG PO CPCR: 1.0000 | ORAL_CAPSULE | Freq: Two times a day (BID) | ORAL | 0 refills | Status: DC

## 2017-11-02 MED ORDER — OXYCODONE-ACETAMINOPHEN 10-325 MG PO TABS: 1.0000 | ORAL_TABLET | Freq: Three times a day (TID) | ORAL | 0 refills | Status: AC | PRN

## 2017-11-02 MED ORDER — OXYCODONE-ACETAMINOPHEN 10-325 MG PO TABS: 1.0000 | ORAL_TABLET | Freq: Three times a day (TID) | ORAL | 0 refills | Status: DC | PRN

## 2017-11-02 NOTE — Progress Notes (Signed)
Nursing Pain Medication Assessment:  Safety precautions to be maintained throughout the outpatient stay will include: orient to surroundings, keep bed in low position, maintain call bell within reach at all times, provide assistance with transfer out of bed and ambulation.  Medication Inspection Compliance: Pill count conducted under aseptic conditions, in front of the patient. Neither the pills nor the bottle was removed from the patient's sight at any time. Once count was completed pills were immediately returned to the patient in their original bottle.  Medication: Embeda Pill/Patch Count: 35 of 60 pills remain Pill/Patch Appearance: Markings consistent with prescribed medication Bottle Appearance: Standard pharmacy container. Clearly labeled. Filled Date: 2 / 1 / 2019 Last Medication intake:  Today

## 2017-11-02 NOTE — Patient Instructions (Signed)
You have been given 2 Rx for EMBEDA to last until 01/04/2018. You have been given 2 Rx for PERCOCET to last until 01/07/2018.____________________________________________________________________________________________  Genicular Nerve Block  What is a genicular nerve block? A genicular nerve block is the injection of a local anesthetic to block the nerves that transmits pain from the knee.  What is the purpose of a facet nerve block? A genicular nerve block is a diagnostic procedure to determine if the pathologic changes (i.e. arthritis, meniscal tears, etc) and inflammation within the knee joint is the source of your knee pain. It also confirms that the knee pain will respond well to the actual treatment procedure. If a genicular nerve block works, it will give you relief for several hours. After that, the pain is expected to return to normal. This test is always performed twice (usually a week or two apart) because two successful tests are required to move onto treatment. If both diagnostic tests are positive, then we schedule a treatment called radiofrequency (RF) ablation. In this procedure, the same nerves are cauterized, which typically leads to pain relief for 4 -18 months. If this process works well for one knee, it can be performed on the other knee if needed.  How is the procedure performed? You will be placed on the procedure table. The injection site is sterilized with either iodine or chlorhexadine. The site to be injected is numbed with a local anesthetic, and a needle is directed to the target area. X-ray guidance is used to ensure proper placement and positioning of the needle. When the needle is properly positioned near the genicular nerve, local anesthetic is injected to numb that nerve. This will be repeated at multiple sites around the knee to block all genicular nerves.  Will the procedure be painful? The injection can be painful and we therefore provide the option of receiving IV  sedation. IV sedation, combined with local anesthetic, can make the injection nearly pain free. It allows you to remain very still during the procedure, which can also make the injection easier, faster, and more successful. If you decide to have IV sedation, you must have a driver to get you home safely afterwards. In addition, you cannot have anything to eat or drink within 8 hours of your appointment (clear liquids are allowed until 3 hours before the procedure). If you take medications for diabetes, these medications may need to be adjusted the morning of the procedure. Your primary care physician can help you with this adjustment.  What are the discharge instructions? If you received IV sedation do not drive or operate machinery for at least 24 hours after the procedure. You may return to work the next day following your procedure. You may resume your normal diet immediately. Do not engage in any strenuous activity for 24 hours. You should, however, engage in moderate activity that typically causes your ususal pain. If the block works, those activities should not be painful for several hours after the injection. Do not take a bath, swim, or use a hot tub for 24 hours (you may take a shower). Call the office if you have any of the following: severe pain afterwards (different than your usual symptoms), redness/swelling/discharge at the injection site(s), fevers/chills, difficulty with bowel or bladder functions.  What are the risks and side effects? The complication rate for this procedure is very low. Whenever a needle enters the skin, bleeding or infection can occur. Some other serious but extremely rare risks include paralysis and death. You may  have an allergic reaction to any of the medications used. If you have a known allergy to any medications, especially local anesthetics, notify our staff before the procedure takes place. You may experience any of the following side effects up to 4 - 6 hours  after the procedure:  Leg muscle weakness or numbness may occur due to the local anesthetic affecting the nerves that control your legs (this is a temporary affect and it is not paralysis). If you have any leg weakness or numbness, walk only with assistance in order to prevent falls and injury. Your leg strength will return slowly and completely.  Dizziness may occur due to a decrease in your blood pressure. If this occurs, remain in a seated or lying position. Gradually sit up, and then stand after at least 10 minutes of sitting.  Mild headaches may occur. Drink fluids and take pain medications if needed. If the headaches persist or become severe, call the office.  Mild discomfort at the injection site can occur. This typically lasts for a few hours but can persist for a couple days. If this occurs, take anti-inflammatories or pain medications, apply ice to the area the day of the procedure. If it persists, apply moist heat in the day(s) following.  The side effects listed above can be normal. They are not dangerous and will resolve on their own. If, however, you experience any of the following, a complication may have occurred and you should either contact your doctor. If he is not readily available, then you should proceed to the closest urgent care center for evaluation:  Severe or progressive pain at the injection site(s)  Arm or leg weakness that progressively worsens or persists for longer than 8 hours  Severe or progressive redness, swelling, or discharge from the injections site(s)  Fevers, chills, nausea, or vomiting  Bowel or bladder dysfunction (i.e. inability to urinate or pass stool or difficulty controlling either)  How long does it take for the procedure to work? You should feel relief from your usual pain within the first hour. Again, this is only expected to last for several hours, at the most. Remember, you may be sore in the middle part of your back from the needles, and  you must distinguish this from your usual pain. ____________________________________________________________________________________________

## 2017-11-06 NOTE — Progress Notes (Signed)
Subjective:  Patient ID: Krista Kennedy, female    DOB: 1966-07-28  Age: 52 y.o. MRN: [id number]  CC: Joint Pain (generalized)   Procedure: None  HPI [name] presents for reevaluation.  She was last seen 2 months ago.  She is continue to have diffuse body pain and primarily low back pain knee pain and hip pain generalized nature.  She is been on long-acting opioids for this for an extended period of time.  Based on her narcotic assessment sheet she continues to do well with her current regimen with good relief derived.  She is able to sleep better and function for longer during the day with her medication management.  No untoward side effects are mentioned today.  The quality characteristic and distribution of her pain of otherwise been unchanged and stable in nature.  Outpatient Medications Prior to Visit  Medication Sig Dispense Refill  . Ascorbic Acid (VITAMIN C) 1000 MG tablet Take 1,000 mg by mouth daily.    . betamethasone dipropionate (DIPROLENE) 0.05 % ointment daily.   0  . carvedilol (COREG) 25 MG tablet Take 1 tablet (25 mg total) by mouth 2 (two) times daily with a meal. 180 tablet 3  . Cholecalciferol (VITAMIN D-1000 MAX ST) 1000 UNITS tablet Take by mouth daily.     . clobetasol (TEMOVATE) 0.05 % external solution Apply 1 application topically as needed.     . digoxin (LANOXIN) 0.25 MG tablet Take 1 tablet (0.25 mg total) by mouth daily. 90 tablet 3  . fluocinonide (LIDEX) 0.05 % external solution Apply 1 application topically daily.   0  . fluticasone (FLONASE) 50 MCG/ACT nasal spray Place 2 sprays into both nostrils daily. 16 g 3  . furosemide (LASIX) 40 MG tablet Take 40 mg by mouth daily as needed. Reported on 01/13/2016    . Ixekizumab (TALTZ) 80 MG/ML SOSY Inject 80 mg into the skin as directed.    Marland Kitchen levothyroxine (SYNTHROID, LEVOTHROID) 125 MCG tablet TAKE ONE TABLET BY MOUTH ONCE DAILY 90 tablet 1  . lisinopril (PRINIVIL,ZESTRIL) 5 MG tablet TAKE ONE TABLET BY  MOUTH ONCE DAILY 90 tablet 1  . naloxone (NARCAN) nasal spray 4 mg/0.1 mL To reverse excess sedation from narcotics 2 kit 1  . venlafaxine XR (EFFEXOR-XR) 75 MG 24 hr capsule TAKE ONE CAPSULE BY MOUTH ONCE DAILY WITH BREAKFAST 90 capsule 3  . carvedilol (COREG) 25 MG tablet TAKE ONE TABLET BY MOUTH TWICE DAILY WITH A MEAL (Patient not taking: Reported on 09/06/2017) 60 tablet 11  . ipratropium (ATROVENT) 0.06 % nasal spray Place 2 sprays into both nostrils 4 (four) times daily. For up to 5-7 days then stop. (Patient not taking: Reported on 07/06/2017) 15 mL 0  . meloxicam (MOBIC) 7.5 MG tablet Take by mouth daily.     Marland Kitchen TALTZ 80 MG/ML SOAJ      No facility-administered medications prior to visit.     Review of Systems CNS: No confusion or sedation Cardiac: No angina or palpitations GI: No abdominal pain or constipation Constitutional: No nausea vomiting fevers or chills  Objective:  BP (!) 146/89   Pulse 92   Temp 98 F (36.7 C) (Oral)   Resp 16   Ht _0  (1.778 m)   Wt 160 lb (72.6 kg)   SpO2 99%   BMI 22.96 kg/m    BP Readings from Last 3 Encounters:  11/02/17 (!) 146/89  09/06/17 (!) 156/89  07/06/17 126/72     Wt Readings from  Last 3 Encounters:  11/02/17 160 lb (72.6 kg)  09/06/17 160 lb (72.6 kg)  07/06/17 160 lb (72.6 kg)     Physical Exam Pt is alert and oriented PERRL EOMI HEART IS RRR no murmur or rub LCTA no wheezing or rales MUSCULOSKELETAL reveals some paraspinous muscle tenderness.  She ambulates with a mildly antalgic gait.  Her muscle tone and bulk to the lower extremities is without change.  Labs  Lab Results  Component Value Date   HGBA1C 5.9 (H) 04/11/2017   Lab Results  Component Value Date   LDLCALC 129 (H) 04/11/2017   CREATININE 0.85 04/11/2017    -------------------------------------------------------------------------------------------------------------------- Lab Results  Component Value Date   WBC 6.2 04/11/2017   HGB 13.7  04/11/2017   HCT 42.0 04/11/2017   PLT 217 04/11/2017   GLUCOSE 104 (H) 04/11/2017   CHOL 223 (H) 04/11/2017   TRIG 98 04/11/2017   HDL 74 04/11/2017   LDLCALC 129 (H) 04/11/2017   ALT 13 04/11/2017   AST 17 04/11/2017   NA 139 04/11/2017   K 3.9 04/11/2017   CL 104 04/11/2017   CREATININE 0.85 04/11/2017   BUN 12 04/11/2017   CO2 26 04/11/2017   TSH 1.57 04/11/2017   HGBA1C 5.9 (H) 04/11/2017    --------------------------------------------------------------------------------------------------------------------- Dg Si Joints  Result Date: 06/22/2017 CLINICAL DATA:  Chronic pain without injury. EXAM: BILATERAL SACROILIAC JOINTS - 3+ VIEW COMPARISON:  None. FINDINGS: The sacroiliac joint spaces are maintained and there is no evidence of arthropathy. No other bone abnormalities are seen. IMPRESSION: Negative. Electronically Signed   By: Staci Righter M.D.   On: 06/22/2017 14:19   Dg Shoulder Right  Result Date: 06/22/2017 CLINICAL DATA:  Chronic right shoulder pain EXAM: RIGHT SHOULDER - 2+ VIEW COMPARISON:  None. FINDINGS: The right humeral head is normally situated in the glenohumeral joint appears normal. Mild acromial spurring is present which can result in impingement. The right [facility] joint is normally aligned. No acute abnormality is seen. IMPRESSION: No acute abnormality. Possible impingement with an acromial spur present. Electronically Signed   By: Ivar Drape M.D.   On: 06/22/2017 14:15   Dg Wrist 2 Views Left  Result Date: 06/22/2017 CLINICAL DATA:  Chronic pain.  No injury. EXAM: LEFT WRIST - 2 VIEW COMPARISON:  No recent. FINDINGS: No acute bony or joint abnormality identified. No evidence of fracture or dislocation. IMPRESSION: No acute abnormality. Electronically Signed   By: Marcello Moores  Register   On: 06/22/2017 14:16   Dg Shoulder Left  Result Date: 06/22/2017 CLINICAL DATA:  Left shoulder pain, no injury EXAM: LEFT SHOULDER - 2+ VIEW COMPARISON:  None. FINDINGS: The  left humeral head is in normal position and the left glenohumeral joint space appears normal. The left AC joint is normally aligned. No significant abnormality is seen. IMPRESSION: Negative. Electronically Signed   By: Ivar Drape M.D.   On: 06/22/2017 14:16   Dg Hip Unilat W Or W/o Pelvis 2-3 Views Left  Result Date: 06/22/2017 CLINICAL DATA:  Chronic bilateral hip pain without known injury. EXAM: DG HIP (WITH OR WITHOUT PELVIS) 2-3V LEFT COMPARISON:  None. FINDINGS: There is no evidence of hip fracture or dislocation. There is no evidence of arthropathy or other focal bone abnormality. IMPRESSION: Normal left hip. Electronically Signed   By: Marijo Conception, M.D.   On: 06/22/2017 14:14   Dg Hip Unilat W Or W/o Pelvis 2-3 Views Right  Result Date: 06/22/2017 CLINICAL DATA:  Chronic pain without injury.  EXAM: DG HIP (WITH OR WITHOUT PELVIS) 2-3V RIGHT COMPARISON:  None. FINDINGS: There is no evidence of hip fracture or dislocation. There is no evidence of arthropathy or other focal bone abnormality. BILATERAL tubal ligation clips are incidentally noted. IMPRESSION: Negative. Electronically Signed   By: Staci Righter M.D.   On: 06/22/2017 14:13     Assessment & Plan:   Sharay was seen today for joint pain.  Diagnoses and all orders for this visit:  Chronic pain syndrome  Chronic left hip pain  Chronic bilateral low back pain with bilateral sciatica  Chronic sacroiliac joint pain  Left wrist pain  Psoriasis with arthropathy (HCC)  Psoriatic arthritis (HCC)  Pain in multiple finger joints  Chronic, continuous use of opioids  Other orders -     Discontinue: oxyCODONE-acetaminophen (PERCOCET) 10-325 MG tablet; Take 1 tablet by mouth every 8 (eight) hours as needed for pain. -     oxyCODONE-acetaminophen (PERCOCET) 10-325 MG tablet; Take 1 tablet by mouth every 8 (eight) hours as needed for pain. -     Discontinue: Morphine-Naltrexone (EMBEDA) 20-0.8 MG CPCR; Take 1 tablet by mouth  2 (two) times daily. -     Morphine-Naltrexone (EMBEDA) 20-0.8 MG CPCR; Take 1 tablet by mouth 2 (two) times daily.        ----------------------------------------------------------------------------------------------------------------------  Problem List Items Addressed This Visit      Unprioritized   Chronic hip pain (Chronic)   Relevant Medications   oxyCODONE-acetaminophen (PERCOCET) 10-325 MG tablet   Morphine-Naltrexone (EMBEDA) 20-0.8 MG CPCR   Chronic low back pain (Chronic)   Relevant Medications   oxyCODONE-acetaminophen (PERCOCET) 10-325 MG tablet   Morphine-Naltrexone (EMBEDA) 20-0.8 MG CPCR   Chronic pain syndrome - Primary (Chronic)   Chronic sacroiliac joint pain (Chronic)   Relevant Medications   oxyCODONE-acetaminophen (PERCOCET) 10-325 MG tablet   Morphine-Naltrexone (EMBEDA) 20-0.8 MG CPCR   Left wrist pain (Chronic)   Pain in multiple finger joints   Psoriasis with arthropathy (HCC)    Other Visit Diagnoses    Psoriatic arthritis (HCC)       Chronic, continuous use of opioids            ----------------------------------------------------------------------------------------------------------------------  1. Chronic pain syndrome We will continue her on her existing regimen.  Refills were given today for her oxycodone and Embeda.  These were for the month of March and Aliyah.  We have also reviewed her [facility] practitioner database information and it is appropriate.  He is to continue follow-up with her primary care physicians for her baseline medical problems  2. Chronic left hip pain   3. Chronic bilateral low back pain with bilateral sciatica   4. Chronic sacroiliac joint pain   5. Left wrist pain   6. Psoriasis with arthropathy [facility]) Continue follow-up with her rheumatology doctors.  7. Psoriatic arthritis ([place])   8. Pain in multiple finger joints   9. Chronic, continuous use of  opioids     ----------------------------------------------------------------------------------------------------------------------  I am having Braylynn Wirz maintain her furosemide, vitamin C, Cholecalciferol, meloxicam, betamethasone dipropionate, fluocinonide, Ixekizumab, naloxone, fluticasone, ipratropium, venlafaxine XR, clobetasol, TALTZ, carvedilol, digoxin, carvedilol, levothyroxine, lisinopril, oxyCODONE-acetaminophen, and Morphine-Naltrexone.   Meds ordered this encounter  Medications  . DISCONTD: oxyCODONE-acetaminophen (PERCOCET) 10-325 MG tablet    Sig: Take 1 tablet by mouth every 8 (eight) hours as needed for pain.    Dispense:  90 tablet    Refill:  0    Do not fill until [id number]  . oxyCODONE-acetaminophen (PERCOCET) 10-325  MG tablet    Sig: Take 1 tablet by mouth every 8 (eight) hours as needed for pain.    Dispense:  90 tablet    Refill:  0    Do not fill until [id number]  . DISCONTD: Morphine-Naltrexone (EMBEDA) 20-0.8 MG CPCR    Sig: Take 1 tablet by mouth 2 (two) times daily.    Dispense:  60 capsule    Refill:  0    Do not fill until [id number]  . Morphine-Naltrexone (EMBEDA) 20-0.8 MG CPCR    Sig: Take 1 tablet by mouth 2 (two) times daily.    Dispense:  60 capsule    Refill:  0    Do not fill until [id number]   Patient's Medications  New Prescriptions   MORPHINE-NALTREXONE (EMBEDA) 20-0.8 MG CPCR    Take 1 tablet by mouth 2 (two) times daily.   OXYCODONE-ACETAMINOPHEN (PERCOCET) 10-325 MG TABLET    Take 1 tablet by mouth every 8 (eight) hours as needed for pain.  Previous Medications   ASCORBIC ACID (VITAMIN C) 1000 MG TABLET    Take 1,000 mg by mouth daily.   BETAMETHASONE DIPROPIONATE (DIPROLENE) 0.05 % OINTMENT    daily.    CARVEDILOL (COREG) 25 MG TABLET    Take 1 tablet (25 mg total) by mouth 2 (two) times daily with a meal.   CARVEDILOL (COREG) 25 MG TABLET    TAKE ONE TABLET BY MOUTH TWICE DAILY WITH A MEAL   CHOLECALCIFEROL (VITAMIN D-1000 MAX ST)  1000 UNITS TABLET    Take by mouth daily.    CLOBETASOL (TEMOVATE) 0.05 % EXTERNAL SOLUTION    Apply 1 application topically as needed.    DIGOXIN (LANOXIN) 0.25 MG TABLET    Take 1 tablet (0.25 mg total) by mouth daily.   FLUOCINONIDE (LIDEX) 0.05 % EXTERNAL SOLUTION    Apply 1 application topically daily.    FLUTICASONE (FLONASE) 50 MCG/ACT NASAL SPRAY    Place 2 sprays into both nostrils daily.   FUROSEMIDE (LASIX) 40 MG TABLET    Take 40 mg by mouth daily as needed. Reported on 01/13/2016   IPRATROPIUM (ATROVENT) 0.06 % NASAL SPRAY    Place 2 sprays into both nostrils 4 (four) times daily. For up to 5-7 days then stop.   IXEKIZUMAB (TALTZ) 80 MG/ML SOSY    Inject 80 mg into the skin as directed.   LEVOTHYROXINE (SYNTHROID, LEVOTHROID) 125 MCG TABLET    TAKE ONE TABLET BY MOUTH ONCE DAILY   LISINOPRIL (PRINIVIL,ZESTRIL) 5 MG TABLET    TAKE ONE TABLET BY MOUTH ONCE DAILY   MELOXICAM (MOBIC) 7.5 MG TABLET    Take by mouth daily.    NALOXONE (NARCAN) NASAL SPRAY 4 MG/0.1 ML    To reverse excess sedation from narcotics   TALTZ 80 MG/ML SOAJ       VENLAFAXINE XR (EFFEXOR-XR) 75 MG 24 HR CAPSULE    TAKE ONE CAPSULE BY MOUTH ONCE DAILY WITH BREAKFAST  Modified Medications   No medications on file  Discontinued Medications   No medications on file   ----------------------------------------------------------------------------------------------------------------------  Follow-up: Return in about 2 months (around 12/31/2017) for evaluation, med refill.    Molli Barrows, MD

## 2017-11-30 ENCOUNTER — Telehealth: Payer: Self-pay | Admitting: Anesthesiology

## 2017-11-30 ENCOUNTER — Telehealth: Payer: Self-pay | Admitting: *Deleted

## 2017-11-30 NOTE — Telephone Encounter (Signed)
Voicemail left with patient that Jenetta Downer has been PA for 30 days and appeal is underway for longterm.

## 2017-11-30 NOTE — Telephone Encounter (Signed)
Patient states she needs prior auth for [employer], Please assist

## 2017-11-30 NOTE — Telephone Encounter (Signed)
Patient advised per voicemail that PA has been sent. Response will probable take 3-4 days.

## 2017-12-28 ENCOUNTER — Other Ambulatory Visit: Payer: Self-pay

## 2017-12-28 ENCOUNTER — Encounter: Payer: Self-pay | Admitting: Anesthesiology

## 2017-12-28 ENCOUNTER — Ambulatory Visit: Payer: Commercial Managed Care - PPO | Attending: Anesthesiology | Admitting: Anesthesiology

## 2017-12-28 VITALS — BP 140/74 | HR 87 | Temp 98.1°F | Resp 16 | Ht 70.0 in | Wt 160.0 lb

## 2017-12-28 DIAGNOSIS — M5441 Lumbago with sciatica, right side: Secondary | ICD-10-CM

## 2017-12-28 DIAGNOSIS — M545 Low back pain, unspecified: Secondary | ICD-10-CM

## 2017-12-28 DIAGNOSIS — G894 Chronic pain syndrome: Secondary | ICD-10-CM | POA: Diagnosis not present

## 2017-12-28 DIAGNOSIS — F1190 Opioid use, unspecified, uncomplicated: Secondary | ICD-10-CM | POA: Diagnosis not present

## 2017-12-28 DIAGNOSIS — G8929 Other chronic pain: Secondary | ICD-10-CM | POA: Diagnosis not present

## 2017-12-28 DIAGNOSIS — L4050 Arthropathic psoriasis, unspecified: Secondary | ICD-10-CM

## 2017-12-28 DIAGNOSIS — M25552 Pain in left hip: Secondary | ICD-10-CM | POA: Diagnosis not present

## 2017-12-28 DIAGNOSIS — M5442 Lumbago with sciatica, left side: Secondary | ICD-10-CM | POA: Diagnosis not present

## 2017-12-28 DIAGNOSIS — M25549 Pain in joints of unspecified hand: Secondary | ICD-10-CM | POA: Diagnosis not present

## 2017-12-28 DIAGNOSIS — M25532 Pain in left wrist: Secondary | ICD-10-CM

## 2017-12-28 DIAGNOSIS — M533 Sacrococcygeal disorders, not elsewhere classified: Secondary | ICD-10-CM

## 2017-12-28 DIAGNOSIS — M25511 Pain in right shoulder: Secondary | ICD-10-CM

## 2017-12-28 MED ORDER — OXYCODONE HCL 10 MG PO TABS: 10.0000 mg | ORAL_TABLET | Freq: Four times a day (QID) | ORAL | 0 refills | Status: DC

## 2017-12-28 MED ORDER — NALOXONE HCL 4 MG/0.1ML NA LIQD: NASAL | 1 refills | Status: AC

## 2017-12-28 NOTE — Progress Notes (Signed)
Nursing Pain Medication Assessment:  Safety precautions to be maintained throughout the outpatient stay will include: orient to surroundings, keep bed in low position, maintain call bell within reach at all times, provide assistance with transfer out of bed and ambulation.  Medication Inspection Compliance: Pill count conducted under aseptic conditions, in front of the patient. Neither the pills nor the bottle was removed from the patient's sight at any time. Once count was completed pills were immediately returned to the patient in their original bottle.  Medication: Embedda Pill/Patch Count: 21 of 60 pills remain Pill/Patch Appearance: Markings consistent with prescribed medication Bottle Appearance: Standard pharmacy container. Clearly labeled. Filled Date: 03/28/ 2019 Last Medication intake:  Today

## 2017-12-29 NOTE — Progress Notes (Signed)
Subjective:  Patient ID: Krista Kennedy, female    DOB: 07-Jun-1966  Age: 52 y.o. MRN: [id number]  CC: Joint Pain (hips, hands, ankles)   Procedure: None  HPI Krista Kennedy presents for reevaluation.  She was last seen 2 months ago and has been doing well in regards to the nature quality characteristic and distribution of her diffuse body pain.  Maintains that the TALTZ continues to help with her pain however it remains severe in nature requiring persistent use of the Embeda and oxycodone.  Based on her narcotic assessment sheet she continues to derive good functional lifestyle improvement with her medications.  She does state that the insurance that she has has created some problems with coverage on the Embeda.  She is requesting a change back to previous dosing secondary to expense.  Otherwise she is tolerating her medications without any difficulty.  Denies any diverting or illicit use with her medications as well.  Outpatient Medications Prior to Visit  Medication Sig Dispense Refill  . Ascorbic Acid (VITAMIN C) 1000 MG tablet Take 1,000 mg by mouth daily.    . betamethasone dipropionate (DIPROLENE) 0.05 % ointment daily.   0  . carvedilol (COREG) 25 MG tablet Take 1 tablet (25 mg total) by mouth 2 (two) times daily with a meal. 180 tablet 3  . Cholecalciferol (VITAMIN D-1000 MAX ST) 1000 UNITS tablet Take by mouth daily.     . clobetasol (TEMOVATE) 0.05 % external solution Apply 1 application topically as needed.     . digoxin (LANOXIN) 0.25 MG tablet Take 1 tablet (0.25 mg total) by mouth daily. 90 tablet 3  . fluocinonide (LIDEX) 0.05 % external solution Apply 1 application topically daily.   0  . fluticasone (FLONASE) 50 MCG/ACT nasal spray Place 2 sprays into both nostrils daily. 16 g 3  . ipratropium (ATROVENT) 0.06 % nasal spray Place 2 sprays into both nostrils 4 (four) times daily. For up to 5-7 days then stop. 15 mL 0  . Ixekizumab (TALTZ) 80 MG/ML SOSY Inject 80 mg into the skin  as directed.    Marland Kitchen levothyroxine (SYNTHROID, LEVOTHROID) 125 MCG tablet TAKE ONE TABLET BY MOUTH ONCE DAILY 90 tablet 1  . lisinopril (PRINIVIL,ZESTRIL) 5 MG tablet TAKE ONE TABLET BY MOUTH ONCE DAILY 90 tablet 1  . Morphine-Naltrexone (EMBEDA) 20-0.8 MG CPCR Take 1 tablet by mouth 2 (two) times daily. 60 capsule 0  . venlafaxine XR (EFFEXOR-XR) 75 MG 24 hr capsule TAKE ONE CAPSULE BY MOUTH ONCE DAILY WITH BREAKFAST 90 capsule 3  . naloxone (NARCAN) nasal spray 4 mg/0.1 mL To reverse excess sedation from narcotics 2 kit 1  . carvedilol (COREG) 25 MG tablet TAKE ONE TABLET BY MOUTH TWICE DAILY WITH A MEAL (Patient not taking: Reported on 09/06/2017) 60 tablet 11  . furosemide (LASIX) 40 MG tablet Take 40 mg by mouth daily as needed. Reported on 01/13/2016    . meloxicam (MOBIC) 7.5 MG tablet Take by mouth daily.     Marland Kitchen TALTZ 80 MG/ML SOAJ      No facility-administered medications prior to visit.     Review of Systems CNS: No confusion or sedation Cardiac: No angina or palpitations GI: No abdominal pain or constipation Constitutional: No nausea vomiting fevers or chills  Objective:  BP 140/74   Pulse 87   Temp 98.1 F (36.7 C) (Oral)   Resp 16   Ht '5\' 10"'$  (1.778 m)   Wt 160 lb (72.6 kg)   SpO2 98%  BMI 22.96 kg/m    BP Readings from Last 3 Encounters:  12/28/17 140/74  11/02/17 (!) 146/89  09/06/17 (!) 156/89     Wt Readings from Last 3 Encounters:  12/28/17 160 lb (72.6 kg)  11/02/17 160 lb (72.6 kg)  09/06/17 160 lb (72.6 kg)     Physical Exam Pt is alert and oriented PERRL EOMI HEART IS RRR no murmur or rub LCTA no wheezing or rales MUSCULOSKELETAL reveals good upper and lower extremity muscle tone and bulk.  She is ambulating well.  Labs  Lab Results  Component Value Date   HGBA1C 5.9 (H) 04/11/2017   Lab Results  Component Value Date   LDLCALC 129 (H) 04/11/2017   CREATININE 0.85 04/11/2017     -------------------------------------------------------------------------------------------------------------------- Lab Results  Component Value Date   WBC 6.2 04/11/2017   HGB 13.7 04/11/2017   HCT 42.0 04/11/2017   PLT 217 04/11/2017   GLUCOSE 104 (H) 04/11/2017   CHOL 223 (H) 04/11/2017   TRIG 98 04/11/2017   HDL 74 04/11/2017   LDLCALC 129 (H) 04/11/2017   ALT 13 04/11/2017   AST 17 04/11/2017   NA 139 04/11/2017   K 3.9 04/11/2017   CL 104 04/11/2017   CREATININE 0.85 04/11/2017   BUN 12 04/11/2017   CO2 26 04/11/2017   TSH 1.57 04/11/2017   HGBA1C 5.9 (H) 04/11/2017    --------------------------------------------------------------------------------------------------------------------- Dg Si Joints  Result Date: 06/22/2017 CLINICAL DATA:  Chronic pain without injury. EXAM: BILATERAL SACROILIAC JOINTS - 3+ VIEW COMPARISON:  None. FINDINGS: The sacroiliac joint spaces are maintained and there is no evidence of arthropathy. No other bone abnormalities are seen. IMPRESSION: Negative. Electronically Signed   By: Staci Righter M.D.   On: 06/22/2017 14:19   Dg Shoulder Right  Result Date: 06/22/2017 CLINICAL DATA:  Chronic right shoulder pain EXAM: RIGHT SHOULDER - 2+ VIEW COMPARISON:  None. FINDINGS: The right humeral head is normally situated in the glenohumeral joint appears normal. Mild acromial spurring is present which can result in impingement. The right [facility] joint is normally aligned. No acute abnormality is seen. IMPRESSION: No acute abnormality. Possible impingement with an acromial spur present. Electronically Signed   By: Ivar Drape M.D.   On: 06/22/2017 14:15   Dg Wrist 2 Views Left  Result Date: 06/22/2017 CLINICAL DATA:  Chronic pain.  No injury. EXAM: LEFT WRIST - 2 VIEW COMPARISON:  No recent. FINDINGS: No acute bony or joint abnormality identified. No evidence of fracture or dislocation. IMPRESSION: No acute abnormality. Electronically Signed   By: Marcello Moores   Register   On: 06/22/2017 14:16   Dg Shoulder Left  Result Date: 06/22/2017 CLINICAL DATA:  Left shoulder pain, no injury EXAM: LEFT SHOULDER - 2+ VIEW COMPARISON:  None. FINDINGS: The left humeral head is in normal position and the left glenohumeral joint space appears normal. The left AC joint is normally aligned. No significant abnormality is seen. IMPRESSION: Negative. Electronically Signed   By: Ivar Drape M.D.   On: 06/22/2017 14:16   Dg Hip Unilat W Or W/o Pelvis 2-3 Views Left  Result Date: 06/22/2017 CLINICAL DATA:  Chronic bilateral hip pain without known injury. EXAM: DG HIP (WITH OR WITHOUT PELVIS) 2-3V LEFT COMPARISON:  None. FINDINGS: There is no evidence of hip fracture or dislocation. There is no evidence of arthropathy or other focal bone abnormality. IMPRESSION: Normal left hip. Electronically Signed   By: Marijo Conception, M.D.   On: 06/22/2017 14:14   Dg Hip  Unilat W Or W/o Pelvis 2-3 Views Right  Result Date: 06/22/2017 CLINICAL DATA:  Chronic pain without injury. EXAM: DG HIP (WITH OR WITHOUT PELVIS) 2-3V RIGHT COMPARISON:  None. FINDINGS: There is no evidence of hip fracture or dislocation. There is no evidence of arthropathy or other focal bone abnormality. BILATERAL tubal ligation clips are incidentally noted. IMPRESSION: Negative. Electronically Signed   By: Staci Righter M.D.   On: 06/22/2017 14:13     Assessment & Plan:   Idabell was seen today for joint pain.  Diagnoses and all orders for this visit:  Chronic pain syndrome  Chronic left hip pain  Chronic bilateral low back pain with bilateral sciatica  Chronic sacroiliac joint pain  Left wrist pain  Psoriasis with arthropathy (HCC)  Psoriatic arthritis (HCC)  Pain in multiple finger joints  Chronic, continuous use of opioids  Chronic right shoulder pain  Chronic bilateral low back pain without sciatica  Other orders -     Discontinue: Oxycodone HCl 10 MG TABS; Take 1 tablet (10 mg total) by  mouth 4 (four) times daily. -     Oxycodone HCl 10 MG TABS; Take 1 tablet (10 mg total) by mouth 4 (four) times daily. -     naloxone (NARCAN) nasal spray 4 mg/0.1 mL; To reverse excess sedation from narcotics        ----------------------------------------------------------------------------------------------------------------------  Problem List Items Addressed This Visit      Unprioritized   Chronic hip pain (Chronic)   Relevant Medications   Oxycodone HCl 10 MG TABS   Chronic low back pain (Chronic)   Relevant Medications   Oxycodone HCl 10 MG TABS   Chronic pain syndrome - Primary (Chronic)   Chronic sacroiliac joint pain (Chronic)   Relevant Medications   Oxycodone HCl 10 MG TABS   Chronic shoulder pain   Relevant Medications   Oxycodone HCl 10 MG TABS   Left wrist pain (Chronic)   Pain in multiple finger joints   Psoriasis with arthropathy (HCC)    Other Visit Diagnoses    Psoriatic arthritis ([place])       Chronic, continuous use of opioids            ----------------------------------------------------------------------------------------------------------------------  1. Chronic pain syndrome Though she was tolerating the Embeda well based on what she is described today the expenses have been prohibitive.  I am going to switch her back to the oxycodone 10 mg tablets for 4 times daily dosing today.  We will give her medication for May 5 in June 4 with return to clinic in 2 months.  If she should have any problems with the change of medication she is requested to contact us of the pain control center.  We have reviewed the [facility] practitioner database information and it is appropriate.  2. Chronic left hip pain   3. Chronic bilateral low back pain with bilateral sciatica   4. Chronic sacroiliac joint pain   5. Left wrist pain   6. Psoriasis with arthropathy ([place]) Tinea follow-up with her primary care physicians and rheumatologist.  7. Psoriatic  arthritis ([place])   8. Pain in multiple finger joints   9. Chronic, continuous use of opioids As above  10. Chronic right shoulder pain   11. Chronic bilateral low back pain without sciatica     ----------------------------------------------------------------------------------------------------------------------  I am having Krista Kennedy maintain her furosemide, vitamin C, Cholecalciferol, meloxicam, betamethasone dipropionate, fluocinonide, Ixekizumab, fluticasone, ipratropium, venlafaxine XR, clobetasol, TALTZ, carvedilol, digoxin, carvedilol, levothyroxine, lisinopril, Morphine-Naltrexone, Oxycodone  HCl, and naloxone.   Meds ordered this encounter  Medications  . DISCONTD: Oxycodone HCl 10 MG TABS    Sig: Take 1 tablet (10 mg total) by mouth 4 (four) times daily.    Dispense:  120 tablet    Refill:  0    Do not fill until [id number]  . Oxycodone HCl 10 MG TABS    Sig: Take 1 tablet (10 mg total) by mouth 4 (four) times daily.    Dispense:  120 tablet    Refill:  0    Do not fill until [id number]  . naloxone (NARCAN) nasal spray 4 mg/0.1 mL    Sig: To reverse excess sedation from narcotics    Dispense:  2 kit    Refill:  1   Patient's Medications  New Prescriptions   OXYCODONE HCL 10 MG TABS    Take 1 tablet (10 mg total) by mouth 4 (four) times daily.  Previous Medications   ASCORBIC ACID (VITAMIN C) 1000 MG TABLET    Take 1,000 mg by mouth daily.   BETAMETHASONE DIPROPIONATE (DIPROLENE) 0.05 % OINTMENT    daily.    CARVEDILOL (COREG) 25 MG TABLET    Take 1 tablet (25 mg total) by mouth 2 (two) times daily with a meal.   CARVEDILOL (COREG) 25 MG TABLET    TAKE ONE TABLET BY MOUTH TWICE DAILY WITH A MEAL   CHOLECALCIFEROL (VITAMIN D-1000 MAX ST) 1000 UNITS TABLET    Take by mouth daily.    CLOBETASOL (TEMOVATE) 0.05 % EXTERNAL SOLUTION    Apply 1 application topically as needed.    DIGOXIN (LANOXIN) 0.25 MG TABLET    Take 1 tablet (0.25 mg total) by mouth daily.    FLUOCINONIDE (LIDEX) 0.05 % EXTERNAL SOLUTION    Apply 1 application topically daily.    FLUTICASONE (FLONASE) 50 MCG/ACT NASAL SPRAY    Place 2 sprays into both nostrils daily.   FUROSEMIDE (LASIX) 40 MG TABLET    Take 40 mg by mouth daily as needed. Reported on 01/13/2016   IPRATROPIUM (ATROVENT) 0.06 % NASAL SPRAY    Place 2 sprays into both nostrils 4 (four) times daily. For up to 5-7 days then stop.   IXEKIZUMAB (TALTZ) 80 MG/ML SOSY    Inject 80 mg into the skin as directed.   LEVOTHYROXINE (SYNTHROID, LEVOTHROID) 125 MCG TABLET    TAKE ONE TABLET BY MOUTH ONCE DAILY   LISINOPRIL (PRINIVIL,ZESTRIL) 5 MG TABLET    TAKE ONE TABLET BY MOUTH ONCE DAILY   MELOXICAM (MOBIC) 7.5 MG TABLET    Take by mouth daily.    MORPHINE-NALTREXONE (EMBEDA) 20-0.8 MG CPCR    Take 1 tablet by mouth 2 (two) times daily.   TALTZ 80 MG/ML SOAJ       VENLAFAXINE XR (EFFEXOR-XR) 75 MG 24 HR CAPSULE    TAKE ONE CAPSULE BY MOUTH ONCE DAILY WITH BREAKFAST  Modified Medications   Modified Medication Previous Medication   NALOXONE (NARCAN) NASAL SPRAY 4 MG/0.1 ML naloxone (NARCAN) nasal spray 4 mg/0.1 mL      To reverse excess sedation from narcotics    To reverse excess sedation from narcotics  Discontinued Medications   No medications on file   ----------------------------------------------------------------------------------------------------------------------  Follow-up: Return in about 2 months (around 02/27/2018) for evaluation, med refill.    Molli Barrows, MD

## 2018-02-15 ENCOUNTER — Other Ambulatory Visit: Payer: Self-pay

## 2018-02-15 ENCOUNTER — Ambulatory Visit: Payer: Commercial Managed Care - PPO | Attending: Anesthesiology | Admitting: Anesthesiology

## 2018-02-15 ENCOUNTER — Encounter: Payer: Self-pay | Admitting: Anesthesiology

## 2018-02-15 VITALS — BP 152/78 | HR 76 | Temp 98.1°F | Resp 18 | Ht 70.0 in | Wt 160.0 lb

## 2018-02-15 DIAGNOSIS — L4050 Arthropathic psoriasis, unspecified: Secondary | ICD-10-CM

## 2018-02-15 DIAGNOSIS — M25512 Pain in left shoulder: Secondary | ICD-10-CM

## 2018-02-15 DIAGNOSIS — M533 Sacrococcygeal disorders, not elsewhere classified: Secondary | ICD-10-CM

## 2018-02-15 DIAGNOSIS — M25532 Pain in left wrist: Secondary | ICD-10-CM

## 2018-02-15 DIAGNOSIS — M25552 Pain in left hip: Secondary | ICD-10-CM

## 2018-02-15 DIAGNOSIS — M5441 Lumbago with sciatica, right side: Secondary | ICD-10-CM | POA: Insufficient documentation

## 2018-02-15 DIAGNOSIS — G8929 Other chronic pain: Secondary | ICD-10-CM

## 2018-02-15 DIAGNOSIS — M25551 Pain in right hip: Secondary | ICD-10-CM | POA: Insufficient documentation

## 2018-02-15 DIAGNOSIS — Z79899 Other long term (current) drug therapy: Secondary | ICD-10-CM | POA: Diagnosis not present

## 2018-02-15 DIAGNOSIS — Z79891 Long term (current) use of opiate analgesic: Secondary | ICD-10-CM | POA: Insufficient documentation

## 2018-02-15 DIAGNOSIS — G894 Chronic pain syndrome: Secondary | ICD-10-CM | POA: Diagnosis not present

## 2018-02-15 DIAGNOSIS — M5442 Lumbago with sciatica, left side: Secondary | ICD-10-CM

## 2018-02-15 DIAGNOSIS — M25511 Pain in right shoulder: Secondary | ICD-10-CM

## 2018-02-15 DIAGNOSIS — F1190 Opioid use, unspecified, uncomplicated: Secondary | ICD-10-CM

## 2018-02-15 MED ORDER — OXYCODONE HCL 10 MG PO TABS: 10.0000 mg | ORAL_TABLET | Freq: Four times a day (QID) | ORAL | 0 refills | Status: DC

## 2018-02-15 MED ORDER — OXYCODONE HCL 10 MG PO TABS
10.0000 mg | ORAL_TABLET | Freq: Four times a day (QID) | ORAL | 0 refills | Status: DC
Start: 2018-02-15 — End: 2018-02-15

## 2018-02-15 NOTE — Progress Notes (Signed)
Nursing Pain Medication Assessment:  Safety precautions to be maintained throughout the outpatient stay will include: orient to surroundings, keep bed in low position, maintain call bell within reach at all times, provide assistance with transfer out of bed and ambulation.  Medication Inspection Compliance: Pill count conducted under aseptic conditions, in front of the patient. Neither the pills nor the bottle was removed from the patient's sight at any time. Once count was completed pills were immediately returned to the patient in their original bottle.  Medication: Oxycodone IR Pill/Patch Count: 89 of 120 pills remain Pill/Patch Appearance: Markings consistent with prescribed medication Bottle Appearance: Standard pharmacy container. Clearly labeled. Filled Date: 06 / 04 / 2019 Last Medication intake:  Today

## 2018-02-17 NOTE — Progress Notes (Signed)
Subjective:  Patient ID: Krista Kennedy, female    DOB: 1966-08-04  Age: 52 y.o. MRN: [id number]  CC: Hip Pain (bilateral)   Procedure: None  HPI Krista Kennedy presents for evaluation.  She was last seen 2 months ago and is been doing well with the recent change to oxycodone.  She is currently taking 1 tablet 4 times a day and this is working relatively well for her diffuse body pain.  Quality characteristic and distribution of her pain remains stable in nature.  Otherwise she reports that she is in her usual state of health.  In review of her narcotic assessment sheet she continues to derive good functional lifestyle improvement with her medications with minimal side effects.  No change in upper or lower extremity strength or function are noted.  She is tolerating her medications well and denies any diverting or illicit use.  Outpatient Medications Prior to Visit  Medication Sig Dispense Refill  . Ascorbic Acid (VITAMIN C) 1000 MG tablet Take 1,000 mg by mouth daily.    . betamethasone dipropionate (DIPROLENE) 0.05 % ointment daily.   0  . carvedilol (COREG) 25 MG tablet Take 1 tablet (25 mg total) by mouth 2 (two) times daily with a meal. 180 tablet 3  . Cholecalciferol (VITAMIN D-1000 MAX ST) 1000 UNITS tablet Take by mouth daily.     . clobetasol (TEMOVATE) 0.05 % external solution Apply 1 application topically as needed.     . digoxin (LANOXIN) 0.25 MG tablet Take 1 tablet (0.25 mg total) by mouth daily. 90 tablet 3  . fluticasone (FLONASE) 50 MCG/ACT nasal spray Place 2 sprays into both nostrils daily. 16 g 3  . furosemide (LASIX) 40 MG tablet Take 40 mg by mouth daily as needed. Reported on 01/13/2016    . ipratropium (ATROVENT) 0.06 % nasal spray Place 2 sprays into both nostrils 4 (four) times daily. For up to 5-7 days then stop. 15 mL 0  . Ixekizumab (TALTZ) 80 MG/ML SOSY Inject 80 mg into the skin as directed.    Marland Kitchen levothyroxine (SYNTHROID, LEVOTHROID) 125 MCG tablet TAKE ONE  TABLET BY MOUTH ONCE DAILY 90 tablet 1  . lisinopril (PRINIVIL,ZESTRIL) 5 MG tablet TAKE ONE TABLET BY MOUTH ONCE DAILY 90 tablet 1  . naloxone (NARCAN) nasal spray 4 mg/0.1 mL To reverse excess sedation from narcotics 2 kit 1  . venlafaxine XR (EFFEXOR-XR) 75 MG 24 hr capsule TAKE ONE CAPSULE BY MOUTH ONCE DAILY WITH BREAKFAST 90 capsule 3  . Oxycodone HCl 10 MG TABS Take 1 tablet (10 mg total) by mouth 4 (four) times daily. 120 tablet 0  . carvedilol (COREG) 25 MG tablet TAKE ONE TABLET BY MOUTH TWICE DAILY WITH A MEAL (Patient not taking: Reported on 02/15/2018) 60 tablet 11  . fluocinonide (LIDEX) 0.05 % external solution Apply 1 application topically daily.   0  . meloxicam (MOBIC) 7.5 MG tablet Take by mouth daily.     . Morphine-Naltrexone (EMBEDA) 20-0.8 MG CPCR Take 1 tablet by mouth 2 (two) times daily. (Patient not taking: Reported on 02/15/2018) 60 capsule 0  . TALTZ 80 MG/ML SOAJ      No facility-administered medications prior to visit.     Review of Systems CNS: No confusion or sedation Cardiac: No angina or palpitations GI: No abdominal pain or constipation Constitutional: No nausea vomiting fevers or chills  Objective:  BP (!) 152/78   Pulse 76   Temp 98.1 F (36.7 C) (Oral)   Resp 18  Ht '5\' 10"'$  (1.778 m)   Wt 160 lb (72.6 kg)   SpO2 99%   BMI 22.96 kg/m    BP Readings from Last 3 Encounters:  02/15/18 (!) 152/78  12/28/17 140/74  11/02/17 (!) 146/89     Wt Readings from Last 3 Encounters:  02/15/18 160 lb (72.6 kg)  12/28/17 160 lb (72.6 kg)  11/02/17 160 lb (72.6 kg)     Physical Exam Pt is alert and oriented PERRL EOMI HEART IS RRR no murmur or rub MUSCULOSKELETAL reveals some paraspinous muscle tenderness.  There are no overt trigger points.  Her muscle tone and bulk to the upper and lower extremities remains at baseline.  Labs  Lab Results  Component Value Date   HGBA1C 5.9 (H) 04/11/2017   Lab Results  Component Value Date   LDLCALC  129 (H) 04/11/2017   CREATININE 0.85 04/11/2017    -------------------------------------------------------------------------------------------------------------------- Lab Results  Component Value Date   WBC 6.2 04/11/2017   HGB 13.7 04/11/2017   HCT 42.0 04/11/2017   PLT 217 04/11/2017   GLUCOSE 104 (H) 04/11/2017   CHOL 223 (H) 04/11/2017   TRIG 98 04/11/2017   HDL 74 04/11/2017   LDLCALC 129 (H) 04/11/2017   ALT 13 04/11/2017   AST 17 04/11/2017   NA 139 04/11/2017   K 3.9 04/11/2017   CL 104 04/11/2017   CREATININE 0.85 04/11/2017   BUN 12 04/11/2017   CO2 26 04/11/2017   TSH 1.57 04/11/2017   HGBA1C 5.9 (H) 04/11/2017    --------------------------------------------------------------------------------------------------------------------- Dg Si Joints  Result Date: 06/22/2017 CLINICAL DATA:  Chronic pain without injury. EXAM: BILATERAL SACROILIAC JOINTS - 3+ VIEW COMPARISON:  None. FINDINGS: The sacroiliac joint spaces are maintained and there is no evidence of arthropathy. No other bone abnormalities are seen. IMPRESSION: Negative. Electronically Signed   By: Staci Righter M.D.   On: 06/22/2017 14:19   Dg Shoulder Right  Result Date: 06/22/2017 CLINICAL DATA:  Chronic right shoulder pain EXAM: RIGHT SHOULDER - 2+ VIEW COMPARISON:  None. FINDINGS: The right humeral head is normally situated in the glenohumeral joint appears normal. Mild acromial spurring is present which can result in impingement. The right [facility] joint is normally aligned. No acute abnormality is seen. IMPRESSION: No acute abnormality. Possible impingement with an acromial spur present. Electronically Signed   By: Ivar Drape M.D.   On: 06/22/2017 14:15   Dg Wrist 2 Views Left  Result Date: 06/22/2017 CLINICAL DATA:  Chronic pain.  No injury. EXAM: LEFT WRIST - 2 VIEW COMPARISON:  No recent. FINDINGS: No acute bony or joint abnormality identified. No evidence of fracture or dislocation. IMPRESSION: No acute  abnormality. Electronically Signed   By: Marcello Moores  Register   On: 06/22/2017 14:16   Dg Shoulder Left  Result Date: 06/22/2017 CLINICAL DATA:  Left shoulder pain, no injury EXAM: LEFT SHOULDER - 2+ VIEW COMPARISON:  None. FINDINGS: The left humeral head is in normal position and the left glenohumeral joint space appears normal. The left AC joint is normally aligned. No significant abnormality is seen. IMPRESSION: Negative. Electronically Signed   By: Ivar Drape M.D.   On: 06/22/2017 14:16   Dg Hip Unilat W Or W/o Pelvis 2-3 Views Left  Result Date: 06/22/2017 CLINICAL DATA:  Chronic bilateral hip pain without known injury. EXAM: DG HIP (WITH OR WITHOUT PELVIS) 2-3V LEFT COMPARISON:  None. FINDINGS: There is no evidence of hip fracture or dislocation. There is no evidence of arthropathy or other focal  bone abnormality. IMPRESSION: Normal left hip. Electronically Signed   By: Marijo Conception, M.D.   On: 06/22/2017 14:14   Dg Hip Unilat W Or W/o Pelvis 2-3 Views Right  Result Date: 06/22/2017 CLINICAL DATA:  Chronic pain without injury. EXAM: DG HIP (WITH OR WITHOUT PELVIS) 2-3V RIGHT COMPARISON:  None. FINDINGS: There is no evidence of hip fracture or dislocation. There is no evidence of arthropathy or other focal bone abnormality. BILATERAL tubal ligation clips are incidentally noted. IMPRESSION: Negative. Electronically Signed   By: Staci Righter M.D.   On: 06/22/2017 14:13     Assessment & Plan:   Loriann was seen today for hip pain.  Diagnoses and all orders for this visit:  Chronic pain syndrome  Chronic left hip pain  Chronic bilateral low back pain with bilateral sciatica  Chronic sacroiliac joint pain  Left wrist pain  Psoriasis with arthropathy (HCC)  Psoriatic arthritis (HCC)  Chronic, continuous use of opioids  Chronic pain of both shoulders  Other orders -     Discontinue: Oxycodone HCl 10 MG TABS; Take 1 tablet (10 mg total) by mouth 4 (four) times daily. -      Discontinue: Oxycodone HCl 10 MG TABS; Take 1 tablet (10 mg total) by mouth 4 (four) times daily. -     Oxycodone HCl 10 MG TABS; Take 1 tablet (10 mg total) by mouth 4 (four) times daily.        ----------------------------------------------------------------------------------------------------------------------  Problem List Items Addressed This Visit      Unprioritized   Chronic hip pain (Chronic)   Relevant Medications   Oxycodone HCl 10 MG TABS   Chronic low back pain (Chronic)   Relevant Medications   Oxycodone HCl 10 MG TABS   Chronic pain syndrome - Primary (Chronic)   Chronic sacroiliac joint pain (Chronic)   Relevant Medications   Oxycodone HCl 10 MG TABS   Chronic shoulder pain   Relevant Medications   Oxycodone HCl 10 MG TABS   Left wrist pain (Chronic)   Psoriasis with arthropathy ([place])    Other Visit Diagnoses    Psoriatic arthritis ([place])       Chronic, continuous use of opioids            ----------------------------------------------------------------------------------------------------------------------  1. Chronic pain syndrome We will continue with her current regimen.  This seems to be well-tolerated and based on her history today she seems to be doing well with this.  Refills will be given for July 4 and August 3 for her oxycodone 10 mg tablets.  She is to return to clinic in 2 months for reevaluation at that time.  2. Chronic left hip pain As above  3. Chronic bilateral low back pain with bilateral sciatica As above  4. Chronic sacroiliac joint pain As above.  Continue with core stretching strengthening exercises as reviewed previously.  5. Left wrist pain As above  6. Psoriasis with arthropathy ([place]) Continue follow-up with her primary care physicians and rheumatologist.  7. Psoriatic arthritis ([place]) Above  8. Chronic, continuous use of opioids Have reviewed the [facility] practitioner database information and it is  appropriate.  9. Chronic pain of both shoulders    ----------------------------------------------------------------------------------------------------------------------  I am having Shanena Weitman maintain her furosemide, vitamin C, Cholecalciferol, meloxicam, betamethasone dipropionate, fluocinonide, Ixekizumab, fluticasone, ipratropium, venlafaxine XR, clobetasol, TALTZ, carvedilol, digoxin, carvedilol, levothyroxine, lisinopril, Morphine-Naltrexone, naloxone, and Oxycodone HCl.   Meds ordered this encounter  Medications  . DISCONTD: Oxycodone HCl 10 MG TABS  Sig: Take 1 tablet (10 mg total) by mouth 4 (four) times daily.    Dispense:  120 tablet    Refill:  0    Do not fill until [id number]  . DISCONTD: Oxycodone HCl 10 MG TABS    Sig: Take 1 tablet (10 mg total) by mouth 4 (four) times daily.    Dispense:  120 tablet    Refill:  0    Do not fill until [id number]  . Oxycodone HCl 10 MG TABS    Sig: Take 1 tablet (10 mg total) by mouth 4 (four) times daily.    Dispense:  120 tablet    Refill:  0    Do not fill until [id number]   Patient's Medications  New Prescriptions   No medications on file  Previous Medications   ASCORBIC ACID (VITAMIN C) 1000 MG TABLET    Take 1,000 mg by mouth daily.   BETAMETHASONE DIPROPIONATE (DIPROLENE) 0.05 % OINTMENT    daily.    CARVEDILOL (COREG) 25 MG TABLET    Take 1 tablet (25 mg total) by mouth 2 (two) times daily with a meal.   CARVEDILOL (COREG) 25 MG TABLET    TAKE ONE TABLET BY MOUTH TWICE DAILY WITH A MEAL   CHOLECALCIFEROL (VITAMIN D-1000 MAX ST) 1000 UNITS TABLET    Take by mouth daily.    CLOBETASOL (TEMOVATE) 0.05 % EXTERNAL SOLUTION    Apply 1 application topically as needed.    DIGOXIN (LANOXIN) 0.25 MG TABLET    Take 1 tablet (0.25 mg total) by mouth daily.   FLUOCINONIDE (LIDEX) 0.05 % EXTERNAL SOLUTION    Apply 1 application topically daily.    FLUTICASONE (FLONASE) 50 MCG/ACT NASAL SPRAY    Place 2 sprays into both nostrils daily.    FUROSEMIDE (LASIX) 40 MG TABLET    Take 40 mg by mouth daily as needed. Reported on 01/13/2016   IPRATROPIUM (ATROVENT) 0.06 % NASAL SPRAY    Place 2 sprays into both nostrils 4 (four) times daily. For up to 5-7 days then stop.   IXEKIZUMAB (TALTZ) 80 MG/ML SOSY    Inject 80 mg into the skin as directed.   LEVOTHYROXINE (SYNTHROID, LEVOTHROID) 125 MCG TABLET    TAKE ONE TABLET BY MOUTH ONCE DAILY   LISINOPRIL (PRINIVIL,ZESTRIL) 5 MG TABLET    TAKE ONE TABLET BY MOUTH ONCE DAILY   MELOXICAM (MOBIC) 7.5 MG TABLET    Take by mouth daily.    MORPHINE-NALTREXONE (EMBEDA) 20-0.8 MG CPCR    Take 1 tablet by mouth 2 (two) times daily.   NALOXONE (NARCAN) NASAL SPRAY 4 MG/0.1 ML    To reverse excess sedation from narcotics   TALTZ 80 MG/ML SOAJ       VENLAFAXINE XR (EFFEXOR-XR) 75 MG 24 HR CAPSULE    TAKE ONE CAPSULE BY MOUTH ONCE DAILY WITH BREAKFAST  Modified Medications   Modified Medication Previous Medication   OXYCODONE HCL 10 MG TABS Oxycodone HCl 10 MG TABS      Take 1 tablet (10 mg total) by mouth 4 (four) times daily.    Take 1 tablet (10 mg total) by mouth 4 (four) times daily.  Discontinued Medications   No medications on file   ----------------------------------------------------------------------------------------------------------------------  Follow-up: Return in about 2 months (around 04/17/2018) for evaluation, med refill.    Molli Barrows, MD

## 2018-04-06 ENCOUNTER — Telehealth: Payer: Self-pay | Admitting: *Deleted

## 2018-04-06 NOTE — Telephone Encounter (Signed)
Called [facility] in [place] and asked if they could fill script 1 day early due to going out of town.  Informed patient that her script would have to last until the original time.

## 2018-04-11 ENCOUNTER — Other Ambulatory Visit: Payer: Self-pay

## 2018-04-11 ENCOUNTER — Encounter: Payer: Self-pay | Admitting: Anesthesiology

## 2018-04-11 ENCOUNTER — Ambulatory Visit: Payer: Commercial Managed Care - PPO | Attending: Anesthesiology | Admitting: Anesthesiology

## 2018-04-11 VITALS — BP 128/78 | HR 82 | Temp 98.1°F | Resp 18 | Ht 70.0 in | Wt 170.0 lb

## 2018-04-11 DIAGNOSIS — M79604 Pain in right leg: Secondary | ICD-10-CM | POA: Insufficient documentation

## 2018-04-11 DIAGNOSIS — L4050 Arthropathic psoriasis, unspecified: Secondary | ICD-10-CM | POA: Diagnosis not present

## 2018-04-11 DIAGNOSIS — M25551 Pain in right hip: Secondary | ICD-10-CM | POA: Insufficient documentation

## 2018-04-11 DIAGNOSIS — M25512 Pain in left shoulder: Secondary | ICD-10-CM | POA: Diagnosis not present

## 2018-04-11 DIAGNOSIS — M5442 Lumbago with sciatica, left side: Secondary | ICD-10-CM

## 2018-04-11 DIAGNOSIS — G8929 Other chronic pain: Secondary | ICD-10-CM

## 2018-04-11 DIAGNOSIS — M25552 Pain in left hip: Secondary | ICD-10-CM | POA: Diagnosis present

## 2018-04-11 DIAGNOSIS — M533 Sacrococcygeal disorders, not elsewhere classified: Secondary | ICD-10-CM

## 2018-04-11 DIAGNOSIS — G894 Chronic pain syndrome: Secondary | ICD-10-CM | POA: Diagnosis not present

## 2018-04-11 DIAGNOSIS — M25532 Pain in left wrist: Secondary | ICD-10-CM

## 2018-04-11 DIAGNOSIS — M79605 Pain in left leg: Secondary | ICD-10-CM | POA: Insufficient documentation

## 2018-04-11 DIAGNOSIS — M5441 Lumbago with sciatica, right side: Secondary | ICD-10-CM | POA: Diagnosis not present

## 2018-04-11 DIAGNOSIS — Z79891 Long term (current) use of opiate analgesic: Secondary | ICD-10-CM | POA: Diagnosis not present

## 2018-04-11 DIAGNOSIS — Z79899 Other long term (current) drug therapy: Secondary | ICD-10-CM | POA: Insufficient documentation

## 2018-04-11 DIAGNOSIS — M25511 Pain in right shoulder: Secondary | ICD-10-CM

## 2018-04-11 DIAGNOSIS — F1190 Opioid use, unspecified, uncomplicated: Secondary | ICD-10-CM

## 2018-04-11 MED ORDER — OXYCODONE HCL 10 MG PO TABS: 10.0000 mg | ORAL_TABLET | Freq: Four times a day (QID) | ORAL | 0 refills | Status: DC

## 2018-04-11 NOTE — Progress Notes (Signed)
Subjective:  Patient ID: Krista Kennedy, female    DOB: 03/26/1966  Age: 52 y.o. MRN: [id number]  CC: Hip Pain (bilateral, left is worse)   Procedure: None  HPI [name] presents for reevaluation.  She was last seen 2 months ago and is been doing well with her current regimen.  She is taking her medications as prescribed and these are continue to help with her chronic diffuse body pain.  Based on her narcotic assessment sheet she continues to derive good functional lifestyle improvement with her medicines without any significant side effects.  No change in the quality characteristic or distribution of her pain is noted.  She has had diminished hand pain recently but continues to have diffuse low back knee hip and lower extremity pain.  Outpatient Medications Prior to Visit  Medication Sig Dispense Refill  . Ascorbic Acid (VITAMIN C) 1000 MG tablet Take 1,000 mg by mouth daily.    . betamethasone dipropionate (DIPROLENE) 0.05 % ointment daily.   0  . carvedilol (COREG) 25 MG tablet Take 1 tablet (25 mg total) by mouth 2 (two) times daily with a meal. 180 tablet 3  . Cholecalciferol (VITAMIN D-1000 MAX ST) 1000 UNITS tablet Take by mouth daily.     . clobetasol (TEMOVATE) 0.05 % external solution Apply 1 application topically as needed.     . digoxin (LANOXIN) 0.25 MG tablet Take 1 tablet (0.25 mg total) by mouth daily. 90 tablet 3  . fluocinonide (LIDEX) 0.05 % external solution Apply 1 application topically daily.   0  . fluticasone (FLONASE) 50 MCG/ACT nasal spray Place 2 sprays into both nostrils daily. 16 g 3  . furosemide (LASIX) 40 MG tablet Take 40 mg by mouth daily as needed. Reported on 01/13/2016    . ipratropium (ATROVENT) 0.06 % nasal spray Place 2 sprays into both nostrils 4 (four) times daily. For up to 5-7 days then stop. 15 mL 0  . Ixekizumab (TALTZ) 80 MG/ML SOSY Inject 80 mg into the skin as directed.    Marland Kitchen levothyroxine (SYNTHROID, LEVOTHROID) 125 MCG tablet TAKE ONE  TABLET BY MOUTH ONCE DAILY 90 tablet 1  . lisinopril (PRINIVIL,ZESTRIL) 5 MG tablet TAKE ONE TABLET BY MOUTH ONCE DAILY 90 tablet 1  . naloxone (NARCAN) nasal spray 4 mg/0.1 mL To reverse excess sedation from narcotics 2 kit 1  . venlafaxine XR (EFFEXOR-XR) 75 MG 24 hr capsule TAKE ONE CAPSULE BY MOUTH ONCE DAILY WITH BREAKFAST 90 capsule 3  . Oxycodone HCl 10 MG TABS Take 1 tablet (10 mg total) by mouth 4 (four) times daily. 120 tablet 0  . carvedilol (COREG) 25 MG tablet TAKE ONE TABLET BY MOUTH TWICE DAILY WITH A MEAL (Patient not taking: Reported on 02/15/2018) 60 tablet 11  . meloxicam (MOBIC) 7.5 MG tablet Take by mouth daily.     . Morphine-Naltrexone (EMBEDA) 20-0.8 MG CPCR Take 1 tablet by mouth 2 (two) times daily. (Patient not taking: Reported on 02/15/2018) 60 capsule 0  . TALTZ 80 MG/ML SOAJ      No facility-administered medications prior to visit.     Review of Systems CNS: No confusion or sedation Cardiac: No angina or palpitations GI: No abdominal pain or constipation Constitutional: No nausea vomiting fevers or chills  Objective:  BP 128/78   Pulse 82   Temp 98.1 F (36.7 C)   Resp 18   Ht 5' 10" (1.778 m)   Wt 170 lb (77.1 kg)   SpO2 99%  BMI 24.39 kg/m    BP Readings from Last 3 Encounters:  04/11/18 128/78  02/15/18 (!) 152/78  12/28/17 140/74     Wt Readings from Last 3 Encounters:  04/11/18 170 lb (77.1 kg)  02/15/18 160 lb (72.6 kg)  12/28/17 160 lb (72.6 kg)     Physical Exam Pt is alert and oriented PERRL EOMI HEART IS RRR no murmur or rub LCTA no wheezing or rales MUSCULOSKELETAL reveals some paraspinous muscle tenderness in the low back no overt trigger points her muscle tone and bulk to the lower extremities is good and she is walking without an antalgic gait.  Labs  Lab Results  Component Value Date   HGBA1C 5.9 (H) 04/11/2017   Lab Results  Component Value Date   LDLCALC 129 (H) 04/11/2017   CREATININE 0.85 04/11/2017     -------------------------------------------------------------------------------------------------------------------- Lab Results  Component Value Date   WBC 6.2 04/11/2017   HGB 13.7 04/11/2017   HCT 42.0 04/11/2017   PLT 217 04/11/2017   GLUCOSE 104 (H) 04/11/2017   CHOL 223 (H) 04/11/2017   TRIG 98 04/11/2017   HDL 74 04/11/2017   LDLCALC 129 (H) 04/11/2017   ALT 13 04/11/2017   AST 17 04/11/2017   NA 139 04/11/2017   K 3.9 04/11/2017   CL 104 04/11/2017   CREATININE 0.85 04/11/2017   BUN 12 04/11/2017   CO2 26 04/11/2017   TSH 1.57 04/11/2017   HGBA1C 5.9 (H) 04/11/2017    --------------------------------------------------------------------------------------------------------------------- Dg Si Joints  Result Date: 06/22/2017 CLINICAL DATA:  Chronic pain without injury. EXAM: BILATERAL SACROILIAC JOINTS - 3+ VIEW COMPARISON:  None. FINDINGS: The sacroiliac joint spaces are maintained and there is no evidence of arthropathy. No other bone abnormalities are seen. IMPRESSION: Negative. Electronically Signed   By: Staci Righter M.D.   On: 06/22/2017 14:19   Dg Shoulder Right  Result Date: 06/22/2017 CLINICAL DATA:  Chronic right shoulder pain EXAM: RIGHT SHOULDER - 2+ VIEW COMPARISON:  None. FINDINGS: The right humeral head is normally situated in the glenohumeral joint appears normal. Mild acromial spurring is present which can result in impingement. The right [facility] joint is normally aligned. No acute abnormality is seen. IMPRESSION: No acute abnormality. Possible impingement with an acromial spur present. Electronically Signed   By: Ivar Drape M.D.   On: 06/22/2017 14:15   Dg Wrist 2 Views Left  Result Date: 06/22/2017 CLINICAL DATA:  Chronic pain.  No injury. EXAM: LEFT WRIST - 2 VIEW COMPARISON:  No recent. FINDINGS: No acute bony or joint abnormality identified. No evidence of fracture or dislocation. IMPRESSION: No acute abnormality. Electronically Signed   By: Marcello Moores   Register   On: 06/22/2017 14:16   Dg Shoulder Left  Result Date: 06/22/2017 CLINICAL DATA:  Left shoulder pain, no injury EXAM: LEFT SHOULDER - 2+ VIEW COMPARISON:  None. FINDINGS: The left humeral head is in normal position and the left glenohumeral joint space appears normal. The left AC joint is normally aligned. No significant abnormality is seen. IMPRESSION: Negative. Electronically Signed   By: Ivar Drape M.D.   On: 06/22/2017 14:16   Dg Hip Unilat W Or W/o Pelvis 2-3 Views Left  Result Date: 06/22/2017 CLINICAL DATA:  Chronic bilateral hip pain without known injury. EXAM: DG HIP (WITH OR WITHOUT PELVIS) 2-3V LEFT COMPARISON:  None. FINDINGS: There is no evidence of hip fracture or dislocation. There is no evidence of arthropathy or other focal bone abnormality. IMPRESSION: Normal left hip. Electronically Signed  By: Marijo Conception, M.D.   On: 06/22/2017 14:14   Dg Hip Unilat W Or W/o Pelvis 2-3 Views Right  Result Date: 06/22/2017 CLINICAL DATA:  Chronic pain without injury. EXAM: DG HIP (WITH OR WITHOUT PELVIS) 2-3V RIGHT COMPARISON:  None. FINDINGS: There is no evidence of hip fracture or dislocation. There is no evidence of arthropathy or other focal bone abnormality. BILATERAL tubal ligation clips are incidentally noted. IMPRESSION: Negative. Electronically Signed   By: Staci Righter M.D.   On: 06/22/2017 14:13     Assessment & Plan:   Shamicka was seen today for hip pain.  Diagnoses and all orders for this visit:  Chronic pain syndrome  Chronic left hip pain  Chronic bilateral low back pain with bilateral sciatica  Chronic sacroiliac joint pain  Left wrist pain  Psoriasis with arthropathy (HCC)  Psoriatic arthritis (HCC)  Chronic, continuous use of opioids  Chronic pain of both shoulders  Other orders -     Discontinue: Oxycodone HCl 10 MG TABS; Take 1 tablet (10 mg total) by mouth 4 (four) times daily. -     Oxycodone HCl 10 MG TABS; Take 1 tablet (10 mg  total) by mouth 4 (four) times daily.        ----------------------------------------------------------------------------------------------------------------------  Problem List Items Addressed This Visit      Unprioritized   Chronic hip pain (Chronic)   Relevant Medications   Oxycodone HCl 10 MG TABS   Chronic low back pain (Chronic)   Relevant Medications   Oxycodone HCl 10 MG TABS   Chronic pain syndrome - Primary (Chronic)   Chronic sacroiliac joint pain (Chronic)   Relevant Medications   Oxycodone HCl 10 MG TABS   Chronic shoulder pain   Relevant Medications   Oxycodone HCl 10 MG TABS   Left wrist pain (Chronic)   Psoriasis with arthropathy ([place])    Other Visit Diagnoses    Psoriatic arthritis ([place])       Chronic, continuous use of opioids            ----------------------------------------------------------------------------------------------------------------------  1. Chronic pain syndrome We will continue with her current regimen.  She has been compliant with this and this is working well for her no side effects from her medication are reported at this time.  2. Chronic left hip pain Continue core stretching strengthening exercises.  3. Chronic bilateral low back pain with bilateral sciatica As above  4. Chronic sacroiliac joint pain As above  5. Left wrist pain Continue follow-up with her room with rheumatologist.  6. Psoriasis with arthropathy ([place]) As above  7. Psoriatic arthritis ([place]) As above  8. Chronic, continuous use of opioids We have reviewed the [facility] practitioner database information and it is appropriate.  Refills will be given for her oxycodone for September 2 and October 2.  This will be for 10 mg 4 times daily.  We will have her return to clinic in 2 months  9. Chronic pain of both shoulders     ----------------------------------------------------------------------------------------------------------------------  I  am having Namya Randon maintain her furosemide, vitamin C, Cholecalciferol, meloxicam, betamethasone dipropionate, fluocinonide, Ixekizumab, fluticasone, ipratropium, venlafaxine XR, clobetasol, TALTZ, carvedilol, digoxin, carvedilol, levothyroxine, lisinopril, Morphine-Naltrexone, naloxone, and Oxycodone HCl.   Meds ordered this encounter  Medications  . DISCONTD: Oxycodone HCl 10 MG TABS    Sig: Take 1 tablet (10 mg total) by mouth 4 (four) times daily.    Dispense:  120 tablet    Refill:  0  Do not fill until [id number]  . Oxycodone HCl 10 MG TABS    Sig: Take 1 tablet (10 mg total) by mouth 4 (four) times daily.    Dispense:  120 tablet    Refill:  0    Do not fill until [id number]   Patient's Medications  New Prescriptions   No medications on file  Previous Medications   ASCORBIC ACID (VITAMIN C) 1000 MG TABLET    Take 1,000 mg by mouth daily.   BETAMETHASONE DIPROPIONATE (DIPROLENE) 0.05 % OINTMENT    daily.    CARVEDILOL (COREG) 25 MG TABLET    Take 1 tablet (25 mg total) by mouth 2 (two) times daily with a meal.   CARVEDILOL (COREG) 25 MG TABLET    TAKE ONE TABLET BY MOUTH TWICE DAILY WITH A MEAL   CHOLECALCIFEROL (VITAMIN D-1000 MAX ST) 1000 UNITS TABLET    Take by mouth daily.    CLOBETASOL (TEMOVATE) 0.05 % EXTERNAL SOLUTION    Apply 1 application topically as needed.    DIGOXIN (LANOXIN) 0.25 MG TABLET    Take 1 tablet (0.25 mg total) by mouth daily.   FLUOCINONIDE (LIDEX) 0.05 % EXTERNAL SOLUTION    Apply 1 application topically daily.    FLUTICASONE (FLONASE) 50 MCG/ACT NASAL SPRAY    Place 2 sprays into both nostrils daily.   FUROSEMIDE (LASIX) 40 MG TABLET    Take 40 mg by mouth daily as needed. Reported on 01/13/2016   IPRATROPIUM (ATROVENT) 0.06 % NASAL SPRAY    Place 2 sprays into both nostrils 4 (four) times daily. For up to 5-7 days then stop.   IXEKIZUMAB (TALTZ) 80 MG/ML SOSY    Inject 80 mg into the skin as directed.   LEVOTHYROXINE (SYNTHROID, LEVOTHROID) 125  MCG TABLET    TAKE ONE TABLET BY MOUTH ONCE DAILY   LISINOPRIL (PRINIVIL,ZESTRIL) 5 MG TABLET    TAKE ONE TABLET BY MOUTH ONCE DAILY   MELOXICAM (MOBIC) 7.5 MG TABLET    Take by mouth daily.    MORPHINE-NALTREXONE (EMBEDA) 20-0.8 MG CPCR    Take 1 tablet by mouth 2 (two) times daily.   NALOXONE (NARCAN) NASAL SPRAY 4 MG/0.1 ML    To reverse excess sedation from narcotics   TALTZ 80 MG/ML SOAJ       VENLAFAXINE XR (EFFEXOR-XR) 75 MG 24 HR CAPSULE    TAKE ONE CAPSULE BY MOUTH ONCE DAILY WITH BREAKFAST  Modified Medications   Modified Medication Previous Medication   OXYCODONE HCL 10 MG TABS Oxycodone HCl 10 MG TABS      Take 1 tablet (10 mg total) by mouth 4 (four) times daily.    Take 1 tablet (10 mg total) by mouth 4 (four) times daily.  Discontinued Medications   No medications on file   ----------------------------------------------------------------------------------------------------------------------  Follow-up: Return in about 2 months (around 06/11/2018) for evaluation, med refill.    Molli Barrows, MD

## 2018-04-11 NOTE — Progress Notes (Signed)
Nursing Pain Medication Assessment:  Safety precautions to be maintained throughout the outpatient stay will include: orient to surroundings, keep bed in low position, maintain call bell within reach at all times, provide assistance with transfer out of bed and ambulation.  Medication Inspection Compliance: Pill count conducted under aseptic conditions, in front of the patient. Neither the pills nor the bottle was removed from the patient's sight at any time. Once count was completed pills were immediately returned to the patient in their original bottle.  Medication: Oxycodone IR Pill/Patch Count: 95 of 120 pills remain Pill/Patch Appearance: Markings consistent with prescribed medication Bottle Appearance: Standard pharmacy container. Clearly labeled. Filled Date: 08/ 02 / 2019 Last Medication intake:  Today

## 2018-04-11 NOTE — Patient Instructions (Signed)
Oxycodone prescription given x 2 on separate sheets of paper.

## 2018-04-14 ENCOUNTER — Other Ambulatory Visit: Payer: Self-pay | Admitting: Family Medicine

## 2018-04-14 DIAGNOSIS — I5022 Chronic systolic (congestive) heart failure: Secondary | ICD-10-CM

## 2018-04-14 DIAGNOSIS — F3341 Major depressive disorder, recurrent, in partial remission: Secondary | ICD-10-CM

## 2018-05-02 ENCOUNTER — Telehealth: Payer: Self-pay | Admitting: Anesthesiology

## 2018-05-02 NOTE — Telephone Encounter (Signed)
OK per Dr. Andree Elk to call in Mobic 7.5mg - one per day. Quantity 30 with 1 refill. [facility] in [place].

## 2018-05-02 NOTE — Telephone Encounter (Signed)
Patient needs script for Mobic sent to pharmacy, she was getting this from rheumatology phys, wants to see if dr A can write for her. Please call patient and let her know

## 2018-05-02 NOTE — Telephone Encounter (Signed)
Will ask Dr. Andree Elk and call patient back. Pharmacy confirmed as [facility]. Mobic dose is 7.5mg / one per day.

## 2018-05-04 ENCOUNTER — Emergency Department
Admission: EM | Admit: 2018-05-04 | Discharge: 2018-05-04 | Disposition: A | Discharge status: Home / Self Care | Payer: Commercial Managed Care - PPO | Attending: Emergency Medicine | Admitting: Emergency Medicine

## 2018-05-04 ENCOUNTER — Encounter: Payer: Self-pay | Admitting: Emergency Medicine

## 2018-05-04 ENCOUNTER — Other Ambulatory Visit: Payer: Self-pay

## 2018-05-04 DIAGNOSIS — E039 Hypothyroidism, unspecified: Secondary | ICD-10-CM | POA: Insufficient documentation

## 2018-05-04 DIAGNOSIS — L4050 Arthropathic psoriasis, unspecified: Secondary | ICD-10-CM | POA: Insufficient documentation

## 2018-05-04 DIAGNOSIS — Z87891 Personal history of nicotine dependence: Secondary | ICD-10-CM | POA: Diagnosis not present

## 2018-05-04 DIAGNOSIS — I110 Hypertensive heart disease with heart failure: Secondary | ICD-10-CM | POA: Insufficient documentation

## 2018-05-04 DIAGNOSIS — I5022 Chronic systolic (congestive) heart failure: Secondary | ICD-10-CM | POA: Insufficient documentation

## 2018-05-04 DIAGNOSIS — Z79899 Other long term (current) drug therapy: Secondary | ICD-10-CM | POA: Diagnosis not present

## 2018-05-04 DIAGNOSIS — M1990 Unspecified osteoarthritis, unspecified site: Secondary | ICD-10-CM

## 2018-05-04 DIAGNOSIS — M25552 Pain in left hip: Secondary | ICD-10-CM | POA: Diagnosis present

## 2018-05-04 HISTORY — DX: Unspecified osteoarthritis, unspecified site: M19.90

## 2018-05-04 MED ORDER — PREDNISONE 10 MG PO TABS: ORAL_TABLET | ORAL | 0 refills | Status: DC

## 2018-05-04 MED ORDER — ORPHENADRINE CITRATE 30 MG/ML IJ SOLN
60.0000 mg | Freq: Two times a day (BID) | INTRAMUSCULAR | Status: DC
  Administered 2018-05-04: 60 mg via INTRAMUSCULAR
  Filled 2018-05-04: qty 2

## 2018-05-04 MED ORDER — KETOROLAC TROMETHAMINE 10 MG PO TABS: 10.0000 mg | ORAL_TABLET | Freq: Four times a day (QID) | ORAL | 0 refills | Status: DC | PRN

## 2018-05-04 MED ORDER — KETOROLAC TROMETHAMINE 30 MG/ML IJ SOLN
30.0000 mg | Freq: Once | INTRAMUSCULAR | Status: AC
  Administered 2018-05-04: 30 mg via INTRAMUSCULAR
  Filled 2018-05-04: qty 1

## 2018-05-04 NOTE — ED Triage Notes (Signed)
C/O left hip pain.  States has psoriatic arthritis but pain has been worse over the past 2-3 days.  Patient is a patient at the pain clinic.   AAOx3.  Skin warm and dry.  Ambulates with easy and steady gait.  NAD

## 2018-05-04 NOTE — ED Provider Notes (Signed)
[facility] Emergency Department Provider Note  ____________________________________________  Time seen: Approximately 12:37 PM  I have reviewed the triage vital signs and the nursing notes.   HISTORY  Chief Complaint Hip Pain    HPI Krista Kennedy is a 52 y.o. female that presents to the emergency department for evaluation of chronic left hip pain worsening for 3 days.  Patient goes to the pain clinic for her psoriatic arthritis.  She takes 10 mg of oxycodone daily.  She called the pain clinic 2 days ago and got a prescription for meloxicam.  Pain has not improved.  She states this pain feels the exact same as her arthritis.  She had x-rays completed less than a year ago.  No trauma.  No nausea, vomiting, abdominal pain, back pain, urinary symptoms.   Past Medical History:  Diagnosis Date  . Arthritis   . Cardiomyopathy ([place])   . CHF (congestive heart failure) ([place])   . Hyperlipidemia   . Hypertension   . Psoriasis   . Thyroid disease     Patient Active Problem List   Diagnosis Date Noted  . Chronic shoulder pain 06/14/2017  . Encounter for long-term (current) use of other medications 05/24/2017  . Left wrist pain 04/11/2017  . Chronic sacroiliac joint pain 04/11/2017  . Hyperlipidemia 03/15/2017  . Elevated hemoglobin A1c 03/15/2017  . Therapeutic opioid induced constipation 02/02/2017  . Chronic pain syndrome 04/07/2016  . Chronic hip pain 04/07/2016  . Pain in multiple finger joints 04/07/2016  . Allergic rhinitis due to pollen 09/08/2015  . Other long term (current) drug therapy 08/13/2015  . Depression 08/06/2015  . Opioid dependence, daily use ([place]) 08/06/2015  . High risk medications (not anticoagulants) long-term use 02/11/2015  . Dysmenorrhea 10/29/2013  . Menorrhagia 10/15/2013  . Fibroid 10/10/2013  . Hyperglycemia 08/22/2013  . Hypertension 04/30/2013  . Psoriasis 04/30/2013  . Chronic systolic heart failure ([place]) 04/30/2013  .  Chronic low back pain 11/02/2011  . GERD (gastroesophageal reflux disease) 07/12/2010  . Hypothyroidism 07/12/2010  . Psoriasis with arthropathy ([place]) 07/12/2010  . Avitaminosis D 07/12/2010  . Abnormal mammogram 07/12/2010  . Breast screening 07/12/2010  . Encounter for routine gynecological examination 07/12/2010  . Insomnia 07/12/2010    Past Surgical History:  Procedure Laterality Date  . ENDOMETRIAL ABLATION  2015  . THYROIDECTOMY    . TONSILLECTOMY    . TUBAL LIGATION      Prior to Admission medications   Medication Sig Start Date End Date Taking? Authorizing Provider  Ascorbic Acid (VITAMIN C) 1000 MG tablet Take 1,000 mg by mouth daily.    [provider]  betamethasone dipropionate (DIPROLENE) 0.05 % ointment daily.  02/24/16   [provider]  carvedilol (COREG) 25 MG tablet Take 1 tablet (25 mg total) by mouth 2 (two) times daily with a meal. 04/12/17   Karamalegos, Devonne Doughty, DO  carvedilol (COREG) 25 MG tablet TAKE ONE TABLET BY MOUTH TWICE DAILY WITH A MEAL Patient not taking: Reported on 02/15/2018 07/26/17   Olin Hauser, DO  Cholecalciferol (VITAMIN D-1000 MAX ST) 1000 UNITS tablet Take by mouth daily.     [provider]  clobetasol (TEMOVATE) 0.05 % external solution Apply 1 application topically as needed.  03/22/17   [provider]  digoxin (LANOXIN) 0.25 MG tablet TAKE 1 TABLET BY MOUTH ONCE DAILY 04/15/18   Parks Ranger, Devonne Doughty, DO  fluocinonide (LIDEX) 0.05 % external solution Apply 1 application topically daily.  02/22/16  [provider]  fluticasone (FLONASE) 50 MCG/ACT nasal spray Place 2 sprays into both nostrils daily. 03/15/17   Karamalegos, Devonne Doughty, DO  furosemide (LASIX) 40 MG tablet Take 40 mg by mouth daily as needed. Reported on 01/13/2016    [provider]  ipratropium (ATROVENT) 0.06 % nasal spray Place 2 sprays into both nostrils 4 (four) times daily. For up to 5-7 days then  stop. 03/15/17   Karamalegos, Alexander J, DO  Ixekizumab (TALTZ) 80 MG/ML SOSY Inject 80 mg into the skin as directed.    [provider]  ketorolac (TORADOL) 10 MG tablet Take 1 tablet (10 mg total) by mouth every 6 (six) hours as needed. 05/04/18   Laban Emperor, PA-C  levothyroxine (SYNTHROID, LEVOTHROID) 125 MCG tablet TAKE ONE TABLET BY MOUTH ONCE DAILY 10/13/17   Parks Ranger, Devonne Doughty, DO  lisinopril (PRINIVIL,ZESTRIL) 5 MG tablet TAKE ONE TABLET BY MOUTH ONCE DAILY 10/13/17   Parks Ranger, Devonne Doughty, DO  meloxicam (MOBIC) 7.5 MG tablet Take by mouth daily.  08/13/15   [provider]  Morphine-Naltrexone (EMBEDA) 20-0.8 MG CPCR Take 1 tablet by mouth 2 (two) times daily. Patient not taking: Reported on 02/15/2018 11/02/17   Molli Barrows, MD  naloxone [facility]) nasal spray 4 mg/0.1 mL To reverse excess sedation from narcotics 12/28/17   Molli Barrows, MD  Oxycodone HCl 10 MG TABS Take 1 tablet (10 mg total) by mouth 4 (four) times daily. 04/11/18   Molli Barrows, MD  predniSONE (DELTASONE) 10 MG tablet Take 6 tablets on day 1, take 5 tablets on day 2, take 4 tablets on day 3, take 3 tablets on day 4, take 2 tablets on day 5, take 1 tablet on day 6 05/04/18   Laban Emperor, PA-C  TALTZ 80 MG/ML SOAJ  03/22/17   [provider]  venlafaxine XR (EFFEXOR-XR) 75 MG 24 hr capsule TAKE 1 CAPSULE BY MOUTH ONCE DAILY WITH  BREAKFAST 04/15/18   Karamalegos, Devonne Doughty, DO    Allergies Sulfa antibiotics and Sulfacetamide sodium  Family History  Problem Relation Age of Onset  . Hypertension Mother   . Hyperlipidemia Mother   . Heart disease Father        CABG   . Hyperlipidemia Father   . Hypertension Father     Social History Social History   Tobacco Use  . Smoking status: Former Smoker    Packs/day: 0.25    Years: 15.00    Pack years: 3.75    Types: Cigarettes  . Smokeless tobacco: Former Network engineer Use Topics  . Alcohol use: No  . Drug use: No      Review of Systems  Cardiovascular: No chest pain. Respiratory: No SOB. Gastrointestinal: No abdominal pain.  No nausea, no vomiting.  Musculoskeletal: Positive for hip pain. Skin: Negative for rash, abrasions, lacerations, ecchymosis. Neurological: Negative for headaches   ____________________________________________   PHYSICAL EXAM:  VITAL SIGNS: ED Triage Vitals [05/04/18 1231]  Enc Vitals Group     BP      Pulse      Resp      Temp      Temp src      SpO2      Weight 169 lb 15.6 oz (77.1 kg)     Height 5\' 10"  (1.778 m)     Head Circumference      Peak Flow      Pain Score 7     Pain Loc  Pain Edu?      Excl. in [place]?      Constitutional: Alert and oriented. Well appearing and in no acute distress. Eyes: Conjunctivae are normal. PERRL. EOMI. Head: Atraumatic. ENT:      Ears:      Nose: No congestion/rhinnorhea.      Mouth/Throat: Mucous membranes are moist.  Neck: No stridor.  Cardiovascular: Normal rate, regular rhythm.  Good peripheral circulation. Respiratory: Normal respiratory effort without tachypnea or retractions. Lungs CTAB. Good air entry to the bases with no decreased or absent breath sounds. Gastrointestinal: Bowel sounds 4 quadrants. Soft and nontender to palpation. No guarding or rigidity. No palpable masses. No distention.  Musculoskeletal: Full range of motion to all extremities. No gross deformities appreciated.  Tenderness to palpation over trochanteric bursa.  Full range of motion of left hip.  Strength equal in lower extremities bilaterally.  Normal gait. Neurologic:  Normal speech and language. No gross focal neurologic deficits are appreciated.  Skin:  Skin is warm, dry and intact. No rash noted. Psychiatric: Mood and affect are normal. Speech and behavior are normal. Patient exhibits appropriate insight and judgement.   ____________________________________________   LABS (all labs ordered are listed, but only abnormal results  are displayed)  Labs Reviewed - No data to display ____________________________________________  EKG  ____________________________________________  RADIOLOGY   No results found.  ____________________________________________   PROCEDURES  Procedure(s) performed:    Procedures    Medications  orphenadrine (NORFLEX) injection 60 mg (60 mg Intramuscular Given 05/04/18 1318)  ketorolac (TORADOL) 30 MG/ML injection 30 mg (30 mg Intramuscular Given 05/04/18 1321)     ____________________________________________   INITIAL IMPRESSION / ASSESSMENT AND PLAN / ED COURSE  Pertinent labs & imaging results that were available during my care of the patient were reviewed by me and considered in my medical decision making (see chart for details).  Review of the [place] CSRS was performed in accordance of the [place] prior to dispensing any controlled drugs.    Patient's diagnosis is consistent with psoriatic arthritis.  Vital signs and exam are reassuring.  IM Toradol and Norflex were given.  Patient will be discharged home with prescriptions for Toradol and prednisone.  Patient is to follow up with primary care and pain management as directed. Patient is given ED precautions to return to the ED for any worsening or new symptoms.     ____________________________________________  FINAL CLINICAL IMPRESSION(S) / ED DIAGNOSES  Final diagnoses:  Arthritis      NEW MEDICATIONS STARTED DURING THIS VISIT:  ED Discharge Orders         Ordered    ketorolac (TORADOL) 10 MG tablet  Every 6 hours PRN     05/04/18 1335    predniSONE (DELTASONE) 10 MG tablet     05/04/18 1335              This chart was dictated using voice recognition software/Dragon. Despite best efforts to proofread, errors can occur which can change the meaning. Any change was purely unintentional.    Laban Emperor, PA-C 05/04/18 [place], [place], MD 05/06/18 1326

## 2018-05-10 ENCOUNTER — Telehealth: Payer: Self-pay | Admitting: Nurse Practitioner

## 2018-05-10 DIAGNOSIS — I5022 Chronic systolic (congestive) heart failure: Secondary | ICD-10-CM

## 2018-05-10 DIAGNOSIS — E079 Disorder of thyroid, unspecified: Secondary | ICD-10-CM

## 2018-05-10 DIAGNOSIS — I10 Essential (primary) hypertension: Secondary | ICD-10-CM

## 2018-05-10 NOTE — Telephone Encounter (Signed)
Patient was seen in 03/2017 for physical and sever phone call just for refill so LMTCB patient needs appointment before further refill. Patient was in ER 05/04/2018 for HIP pain.

## 2018-05-10 NOTE — Telephone Encounter (Signed)
Pt needs refills on carvedilol, levothyroxine, digoxin and lisinopril sent to [facility].  Please note pharmacy change

## 2018-05-11 MED ORDER — LEVOTHYROXINE SODIUM 125 MCG PO TABS: 125.0000 ug | ORAL_TABLET | Freq: Every day | ORAL | 0 refills | Status: DC

## 2018-05-11 MED ORDER — LISINOPRIL 5 MG PO TABS: 5.0000 mg | ORAL_TABLET | Freq: Every day | ORAL | 0 refills | Status: DC

## 2018-05-11 MED ORDER — DIGOXIN 250 MCG PO TABS: 250.0000 ug | ORAL_TABLET | Freq: Every day | ORAL | 0 refills | Status: DC

## 2018-05-11 MED ORDER — CARVEDILOL 25 MG PO TABS: 25.0000 mg | ORAL_TABLET | Freq: Two times a day (BID) | ORAL | 0 refills | Status: DC

## 2018-05-11 NOTE — Telephone Encounter (Signed)
Discussed with office staff and [name], AGPCNP-BC.  Since patient is 2 months overdue for yearly physical / and now requesting different provider, she has been scheduled with [name] on Monday 05/14/18 to establish care and med refills. Not to do physical at that time she will need to re-schedule an annual physical after that visit.  Sent 30 day supply meds 0 refills to requested pharmacy. Last fills were at [facility] in [facility] [place] 05/11/2018, 2:20 PM

## 2018-05-14 ENCOUNTER — Ambulatory Visit: Payer: Commercial Managed Care - PPO | Admitting: Nurse Practitioner

## 2018-06-07 ENCOUNTER — Encounter: Payer: Commercial Managed Care - PPO | Admitting: Anesthesiology

## 2018-06-14 ENCOUNTER — Ambulatory Visit: Payer: Commercial Managed Care - PPO | Admitting: Nurse Practitioner

## 2018-06-15 ENCOUNTER — Ambulatory Visit (INDEPENDENT_AMBULATORY_CARE_PROVIDER_SITE_OTHER): Payer: Commercial Managed Care - PPO | Admitting: Nurse Practitioner

## 2018-06-15 ENCOUNTER — Encounter: Payer: Self-pay | Admitting: Nurse Practitioner

## 2018-06-15 ENCOUNTER — Other Ambulatory Visit: Payer: Self-pay

## 2018-06-15 VITALS — BP 164/78 | HR 90 | Temp 98.3°F | Ht 70.0 in | Wt 177.0 lb

## 2018-06-15 DIAGNOSIS — I10 Essential (primary) hypertension: Secondary | ICD-10-CM

## 2018-06-15 DIAGNOSIS — I5022 Chronic systolic (congestive) heart failure: Secondary | ICD-10-CM

## 2018-06-15 DIAGNOSIS — Z9189 Other specified personal risk factors, not elsewhere classified: Secondary | ICD-10-CM

## 2018-06-15 DIAGNOSIS — E079 Disorder of thyroid, unspecified: Secondary | ICD-10-CM | POA: Diagnosis not present

## 2018-06-15 DIAGNOSIS — F419 Anxiety disorder, unspecified: Secondary | ICD-10-CM

## 2018-06-15 DIAGNOSIS — F3341 Major depressive disorder, recurrent, in partial remission: Secondary | ICD-10-CM | POA: Diagnosis not present

## 2018-06-15 DIAGNOSIS — R0981 Nasal congestion: Secondary | ICD-10-CM

## 2018-06-15 MED ORDER — DIGOXIN 250 MCG PO TABS: 250.0000 ug | ORAL_TABLET | Freq: Every day | ORAL | 0 refills | Status: DC

## 2018-06-15 MED ORDER — LEVOTHYROXINE SODIUM 125 MCG PO TABS: 125.0000 ug | ORAL_TABLET | Freq: Every day | ORAL | 0 refills | Status: DC

## 2018-06-15 MED ORDER — CARVEDILOL 25 MG PO TABS
25.0000 mg | ORAL_TABLET | Freq: Two times a day (BID) | ORAL | 1 refills | Status: DC
Start: 2018-06-15 — End: 2018-07-18

## 2018-06-15 MED ORDER — CARVEDILOL 6.25 MG PO TABS: 6.2500 mg | ORAL_TABLET | Freq: Two times a day (BID) | ORAL | 0 refills | Status: DC

## 2018-06-15 MED ORDER — VENLAFAXINE HCL ER 75 MG PO CP24: ORAL_CAPSULE | ORAL | 1 refills | Status: DC

## 2018-06-15 MED ORDER — LISINOPRIL 5 MG PO TABS: 5.0000 mg | ORAL_TABLET | Freq: Every day | ORAL | 1 refills | Status: DC

## 2018-06-15 NOTE — Patient Instructions (Addendum)
Krista Kennedy,   Thank you for coming in to clinic today.  1. Continue all current blood pressure and heart failure meds without change. - Once you are back on carvedilol, BP goal < 130/80  2. Call clinic if you still have palpitations at bedtime about 1-2 weeks after being back on carvedilol AND if it will be more than 2 weeks to see heartcare Cardiology.  3. Cardiology will call you to schedule.  4. Labs for drug levels (digoxin and thyroid) on Monday. - Other labs with physical  5. ENT referral is placed: they will call you to schedule.  Please schedule a follow-up appointment with [name], AGNP. Return in about 3 weeks (around 07/06/2018) for annual physical.  If you have any other questions or concerns, please feel free to call the clinic or send a message through [place]. You may also schedule an earlier appointment if necessary.  You will receive a survey after today's visit either digitally by e-mail or paper by [employer]. Your experiences and feedback matter to [place].  Please respond so we know how we are doing as we provide care for you.   [name], DNP, AGNP-BC Adult Gerontology Nurse Practitioner [place]

## 2018-06-15 NOTE — Progress Notes (Signed)
Subjective:    Patient ID: Krista Kennedy, female    DOB: 1966/06/11, 52 y.o.   MRN: [id number]  Krista Kennedy is a 52 y.o. female presenting on 06/15/2018 for Hypertension   HPI Hypertension Digoxin, lisinopril, carvedilol.  Patient is taking all for management of HF and ht and is currently tolerating well. - She is not checking BP at home or outside of clinic.     - She is not currently symptomatic. - Pt denies headache, lightheadedness, dizziness, changes in vision, chest tightness/pressure, palpitations, leg swelling, sudden loss of speech or loss of consciousness. - She  reports an exercise routine that includes walking, 2-3 days per week. - Her diet is moderate in salt, moderate in fat, and moderate in carbohydrates.   Heart Failure Has fast heart beats or palpitations at bedtime.  Patient is describing more palpitations.  Only out of carvedilol x 1 day.  Needs refill today. - No shortness of breath, chest pain/pressure, Left sided jaw pain (also has psoriatic arthritis)  Moods States she feels mostly stable.  However, she notes increased irritability with behavioral challenges of her 52 year old.  Feels anxious frequently related to things her son is doing.  Is currently taking Effexor 75 mg once daily and tolerates well.  Does not feel she needs any additional medication at this time.  Depression screen [facility] 2/9 04/11/2018 02/15/2018 12/28/2017 11/02/2017 09/06/2017  Decreased Interest 0 0 0 0 0  Down, Depressed, Hopeless 0 0 0 0 0  PHQ - 2 Score 0 0 0 0 0    Social History   Tobacco Use  . Smoking status: Former Smoker    Packs/day: 0.25    Years: 15.00    Pack years: 3.75    Types: Cigarettes  . Smokeless tobacco: Former Network engineer Use Topics  . Alcohol use: No  . Drug use: No    Review of Systems Per HPI unless specifically indicated above     Objective:    BP (!) 164/78 (BP Location: Right Arm, Patient Position: Sitting, Cuff Size: Normal)   Pulse 90    Temp 98.3 F (36.8 C) (Oral)   Ht 5\' 10"  (1.778 m)   Wt 177 lb (80.3 kg)   BMI 25.40 kg/m   Wt Readings from Last 3 Encounters:  06/15/18 177 lb (80.3 kg)  05/04/18 169 lb 15.6 oz (77.1 kg)  04/11/18 170 lb (77.1 kg)    Physical Exam  Constitutional: She is oriented to person, place, and time. She appears well-developed and well-nourished. No distress.  HENT:  Head: Normocephalic and atraumatic.  Neck: Normal range of motion. Neck supple. Carotid bruit is not present. No thyromegaly present.  Cardiovascular: Normal rate, regular rhythm, S1 normal, S2 normal, normal heart sounds and intact distal pulses.  Pulmonary/Chest: Effort normal and breath sounds normal. No respiratory distress.  Musculoskeletal: She exhibits no edema (pedal).  Neurological: She is alert and oriented to person, place, and time.  Skin: Skin is warm and dry. Capillary refill takes less than 2 seconds.  Psychiatric: She has a normal mood and affect. Her behavior is normal. Judgment and thought content normal.  Vitals reviewed.    Results for orders placed or performed in visit on 09/06/17  ToxASSURE Select 13 (MW), Urine  Result Value Ref Range   Summary FINAL       Assessment & Plan:   Problem List Items Addressed This Visit      Cardiovascular and Mediastinum   Hypertension Uncontrolled hypertension  above BP goal < 130/80 today.  Patient has been out of medicine for 3-4 days and carvedilol x 1 day. Pt is working on lifestyle modifications.  Taking medications tolerating well without side effects. Complicated by HF.  Plan: 1. Continue taking medications without change 2. Obtain labs  3. Encouraged heart healthy diet and increasing exercise to 30 minutes most days of the week. 4. Check BP 1-2 x per week at home, keep log, and bring to clinic at next appointment. 5. Follow up 3 months.     Relevant Medications   digoxin (LANOXIN) 0.25 MG tablet   lisinopril (PRINIVIL,ZESTRIL) 5 MG tablet   carvedilol  (COREG) 25 MG tablet   carvedilol (COREG) 6.25 MG tablet     Other   Depression Controlled.  Patient with situational increase in anxiety.  Continue Effexor at current dose without changes.  Recommend counseling, non-pharm stress management strategies. Follow-up 3 months    Relevant Medications   venlafaxine XR (EFFEXOR-XR) 75 MG 24 hr capsule    Other Visit Diagnoses    Chronic systolic congestive heart failure ([place])    -  Primary Stable.  No recent Cardiology evaluation.  Also no recent dig level drawn.  Labs today.  Refills of medications provided.  Continue meds without missing doses.  Follow-up 3 months and prn.   Relevant Medications   digoxin (LANOXIN) 0.25 MG tablet   lisinopril (PRINIVIL,ZESTRIL) 5 MG tablet   carvedilol (COREG) 25 MG tablet   carvedilol (COREG) 6.25 MG tablet   Other Relevant Orders   Ambulatory referral to Cardiology   Digoxin level   Disease of thyroid gland     Hypothyroidism.  Patient with palpitations so will need labs.  Goal is TSH in normal range, but not at upper or lower limits (1-3.5).  Continue levothyroxine 125 mcg once daily follow-up every 6 months    Relevant Medications   levothyroxine (SYNTHROID, LEVOTHROID) 125 MCG tablet   carvedilol (COREG) 25 MG tablet   carvedilol (COREG) 6.25 MG tablet   Other Relevant Orders   TSH   Nasal congestion     Patient with left nare congestion.  Normal exam, no evidence of obstruction on limited exam, no URI symptoms.  1. Referral ENT 2. May start using Flonase if desired 3. Follow-up prn.   Relevant Orders   Ambulatory referral to ENT   Anxiety     See AP depression.   Relevant Medications   venlafaxine XR (EFFEXOR-XR) 75 MG 24 hr capsule   At high risk for adverse medication event     Check drug levels of digoxin.  No current concerns for supratherapeutic dose, however with palpitations could be subtherapeutic.   Relevant Orders   Digoxin level      Meds ordered this encounter  Medications    . digoxin (LANOXIN) 0.25 MG tablet    Sig: Take 1 tablet (250 mcg total) by mouth daily.    Dispense:  90 tablet    Refill:  0  . levothyroxine (SYNTHROID, LEVOTHROID) 125 MCG tablet    Sig: Take 1 tablet (125 mcg total) by mouth daily.    Dispense:  90 tablet    Refill:  0  . lisinopril (PRINIVIL,ZESTRIL) 5 MG tablet    Sig: Take 1 tablet (5 mg total) by mouth daily.    Dispense:  90 tablet    Refill:  1  . venlafaxine XR (EFFEXOR-XR) 75 MG 24 hr capsule    Sig: TAKE 1 CAPSULE BY MOUTH ONCE  DAILY WITH  BREAKFAST    Dispense:  90 capsule    Refill:  1  . carvedilol (COREG) 25 MG tablet    Sig: Take 1 tablet (25 mg total) by mouth 2 (two) times daily with a meal.    Dispense:  180 tablet    Refill:  1  . carvedilol (COREG) 6.25 MG tablet    Sig: Take 1 tablet (6.25 mg total) by mouth 2 (two) times daily with a meal.    Dispense:  60 tablet    Refill:  0    File as cash pay    Order Specific Question:   Supervising Provider    Answer:   Olin Hauser [[id number]]   Follow up plan: Return in about 3 weeks (around 07/06/2018) for annual physical.  [name], DNP, AGPCNP-BC Adult Gerontology Primary Care Nurse Practitioner [facility] 06/15/2018, 1:56 PM

## 2018-06-22 ENCOUNTER — Telehealth: Payer: Self-pay | Admitting: Family Medicine

## 2018-06-22 ENCOUNTER — Other Ambulatory Visit: Payer: Self-pay | Admitting: Nurse Practitioner

## 2018-06-22 ENCOUNTER — Encounter: Payer: Self-pay | Admitting: Nurse Practitioner

## 2018-06-22 DIAGNOSIS — I10 Essential (primary) hypertension: Secondary | ICD-10-CM

## 2018-06-22 DIAGNOSIS — F419 Anxiety disorder, unspecified: Secondary | ICD-10-CM

## 2018-06-22 DIAGNOSIS — I5022 Chronic systolic (congestive) heart failure: Secondary | ICD-10-CM

## 2018-06-22 DIAGNOSIS — F3341 Major depressive disorder, recurrent, in partial remission: Secondary | ICD-10-CM

## 2018-06-22 DIAGNOSIS — E079 Disorder of thyroid, unspecified: Secondary | ICD-10-CM

## 2018-06-22 MED ORDER — VENLAFAXINE HCL ER 75 MG PO CP24: ORAL_CAPSULE | ORAL | 0 refills | Status: DC

## 2018-06-22 MED ORDER — DIGOXIN 250 MCG PO TABS: 250.0000 ug | ORAL_TABLET | Freq: Every day | ORAL | 0 refills | Status: DC

## 2018-06-22 MED ORDER — LISINOPRIL 5 MG PO TABS: 5.0000 mg | ORAL_TABLET | Freq: Every day | ORAL | 0 refills | Status: DC

## 2018-06-22 MED ORDER — LEVOTHYROXINE SODIUM 125 MCG PO TABS: 125.0000 ug | ORAL_TABLET | Freq: Every day | ORAL | 0 refills | Status: DC

## 2018-06-22 NOTE — Telephone Encounter (Signed)
Rx send for only 30 days patient is aware.

## 2018-06-22 NOTE — Telephone Encounter (Signed)
Pt also need lisinopril, digoxin and synthroid sent to [facility].

## 2018-06-22 NOTE — Telephone Encounter (Signed)
Rx send for only 30 days patient is aware until she receive Rx from mail order.

## 2018-06-22 NOTE — Telephone Encounter (Signed)
Pt needs a 30 supply of effexor sent to [facility].  She has not received mail order medication.  Her call back number is [phone]

## 2018-07-03 ENCOUNTER — Encounter: Payer: Self-pay | Admitting: Anesthesiology

## 2018-07-03 ENCOUNTER — Ambulatory Visit: Payer: Commercial Managed Care - PPO | Attending: Anesthesiology | Admitting: Anesthesiology

## 2018-07-03 ENCOUNTER — Other Ambulatory Visit: Payer: Self-pay

## 2018-07-03 VITALS — BP 172/88 | HR 81 | Temp 98.4°F | Resp 16 | Ht 70.0 in | Wt 160.0 lb

## 2018-07-03 DIAGNOSIS — L4050 Arthropathic psoriasis, unspecified: Secondary | ICD-10-CM | POA: Insufficient documentation

## 2018-07-03 DIAGNOSIS — G8929 Other chronic pain: Secondary | ICD-10-CM

## 2018-07-03 DIAGNOSIS — M5442 Lumbago with sciatica, left side: Secondary | ICD-10-CM | POA: Diagnosis not present

## 2018-07-03 DIAGNOSIS — Z79891 Long term (current) use of opiate analgesic: Secondary | ICD-10-CM | POA: Diagnosis not present

## 2018-07-03 DIAGNOSIS — M25552 Pain in left hip: Secondary | ICD-10-CM | POA: Diagnosis present

## 2018-07-03 DIAGNOSIS — M25511 Pain in right shoulder: Secondary | ICD-10-CM

## 2018-07-03 DIAGNOSIS — Z79899 Other long term (current) drug therapy: Secondary | ICD-10-CM | POA: Diagnosis not present

## 2018-07-03 DIAGNOSIS — M25532 Pain in left wrist: Secondary | ICD-10-CM | POA: Insufficient documentation

## 2018-07-03 DIAGNOSIS — Z79890 Hormone replacement therapy (postmenopausal): Secondary | ICD-10-CM | POA: Insufficient documentation

## 2018-07-03 DIAGNOSIS — F1190 Opioid use, unspecified, uncomplicated: Secondary | ICD-10-CM

## 2018-07-03 DIAGNOSIS — M25551 Pain in right hip: Secondary | ICD-10-CM | POA: Diagnosis present

## 2018-07-03 DIAGNOSIS — M5441 Lumbago with sciatica, right side: Secondary | ICD-10-CM | POA: Insufficient documentation

## 2018-07-03 DIAGNOSIS — M533 Sacrococcygeal disorders, not elsewhere classified: Secondary | ICD-10-CM | POA: Diagnosis not present

## 2018-07-03 DIAGNOSIS — M25512 Pain in left shoulder: Secondary | ICD-10-CM

## 2018-07-03 DIAGNOSIS — G894 Chronic pain syndrome: Secondary | ICD-10-CM | POA: Diagnosis not present

## 2018-07-03 DIAGNOSIS — M25549 Pain in joints of unspecified hand: Secondary | ICD-10-CM

## 2018-07-03 MED ORDER — OXYCODONE HCL 10 MG PO TABS: 10.0000 mg | ORAL_TABLET | Freq: Four times a day (QID) | ORAL | 0 refills | Status: DC

## 2018-07-03 NOTE — Progress Notes (Signed)
Nursing Pain Medication Assessment:  Safety precautions to be maintained throughout the outpatient stay will include: orient to surroundings, keep bed in low position, maintain call bell within reach at all times, provide assistance with transfer out of bed and ambulation.  Medication Inspection Compliance: Pill count conducted under aseptic conditions, in front of the patient. Neither the pills nor the bottle was removed from the patient's sight at any time. Once count was completed pills were immediately returned to the patient in their original bottle.  Medication: Oxycodone IR Pill/Patch Count: 34 of 120 pills remain Pill/Patch Appearance: Markings consistent with prescribed medication Bottle Appearance: Standard pharmacy container. Clearly labeled. Filled Date: 10/ 04 / 2019 Last Medication intake:  Today

## 2018-07-03 NOTE — Progress Notes (Signed)
Subjective:  Patient ID: Krista Kennedy, female    DOB: 06-Jun-1966  Age: 52 y.o. MRN: [id number]  CC: Hip Pain (bilateral)   Procedure: None  HPI Monda Caniglia presents for reevaluation.  She was last seen 2 months ago.  Since her last visit she was seen in the emergency room secondary to some left hip pain.  This was evaluated by the ER physicians.  She was given some Toradol and Norflex for this.  She was also started on prednisone as a Dosepak.  This pain has improved since her last visit.  She reports that she has not seen her rheumatologist recently.  She is seen by dermatology clinic for her other medication management and psoriatic arthritis.  No changes in the quality or severity of the pain are otherwise reported and she is doing well with her medications.  I have reviewed her narcotic assessment sheet and she does continue to derive good functional lifestyle improvement with the medications and minimal side effects other than some occasional mild sedation.  Outpatient Medications Prior to Visit  Medication Sig Dispense Refill  . Ascorbic Acid (VITAMIN C) 1000 MG tablet Take 1,000 mg by mouth daily.    . betamethasone dipropionate (DIPROLENE) 0.05 % ointment daily.   0  . carvedilol (COREG) 25 MG tablet Take 1 tablet (25 mg total) by mouth 2 (two) times daily with a meal. 180 tablet 1  . Cholecalciferol (VITAMIN D-1000 MAX ST) 1000 UNITS tablet Take by mouth daily.     . clobetasol (TEMOVATE) 0.05 % external solution Apply 1 application topically as needed.     . digoxin (LANOXIN) 0.25 MG tablet Take 1 tablet (250 mcg total) by mouth daily. 30 tablet 0  . fluocinonide (LIDEX) 0.05 % external solution Apply 1 application topically daily.   0  . fluticasone (FLONASE) 50 MCG/ACT nasal spray Place 2 sprays into both nostrils daily. 16 g 3  . ipratropium (ATROVENT) 0.06 % nasal spray Place 2 sprays into both nostrils 4 (four) times daily. For up to 5-7 days then stop. 15 mL 0  .  Ixekizumab (TALTZ) 80 MG/ML SOSY Inject 80 mg into the skin as directed.    Marland Kitchen levothyroxine (SYNTHROID, LEVOTHROID) 125 MCG tablet Take 1 tablet (125 mcg total) by mouth daily. 30 tablet 0  . lisinopril (PRINIVIL,ZESTRIL) 5 MG tablet Take 1 tablet (5 mg total) by mouth daily. 30 tablet 0  . meloxicam (MOBIC) 7.5 MG tablet Take by mouth daily.     . naloxone (NARCAN) nasal spray 4 mg/0.1 mL To reverse excess sedation from narcotics 2 kit 1  . venlafaxine XR (EFFEXOR-XR) 75 MG 24 hr capsule TAKE 1 CAPSULE BY MOUTH ONCE DAILY WITH  BREAKFAST 30 capsule 0  . Oxycodone HCl 10 MG TABS Take 1 tablet (10 mg total) by mouth 4 (four) times daily. 120 tablet 0  . carvedilol (COREG) 6.25 MG tablet Take 1 tablet (6.25 mg total) by mouth 2 (two) times daily with a meal. (Patient not taking: Reported on 07/03/2018) 60 tablet 0   No facility-administered medications prior to visit.     Review of Systems CNS: No confusion or sedation Cardiac: No angina or palpitations GI: No abdominal pain or constipation Constitutional: No nausea vomiting fevers or chills  Objective:  BP (!) 172/88   Pulse 81   Temp 98.4 F (36.9 C)   Resp 16   Ht '5\' 10"'$  (1.778 m)   Wt 160 lb (72.6 kg)   SpO2 98%  BMI 22.96 kg/m    BP Readings from Last 3 Encounters:  07/03/18 (!) 172/88  06/15/18 (!) 164/78  05/04/18 (!) 159/73     Wt Readings from Last 3 Encounters:  07/03/18 160 lb (72.6 kg)  06/15/18 177 lb (80.3 kg)  05/04/18 169 lb 15.6 oz (77.1 kg)     Physical Exam Pt is alert and oriented PERRL EOMI HEART IS RRR no murmur or rub LCTA no wheezing or rales MUSCULOSKELETAL is otherwise without change  Labs  Lab Results  Component Value Date   HGBA1C 5.9 (H) 04/11/2017   Lab Results  Component Value Date   LDLCALC 129 (H) 04/11/2017   CREATININE 0.85 04/11/2017    -------------------------------------------------------------------------------------------------------------------- Lab Results   Component Value Date   WBC 6.2 04/11/2017   HGB 13.7 04/11/2017   HCT 42.0 04/11/2017   PLT 217 04/11/2017   GLUCOSE 104 (H) 04/11/2017   CHOL 223 (H) 04/11/2017   TRIG 98 04/11/2017   HDL 74 04/11/2017   LDLCALC 129 (H) 04/11/2017   ALT 13 04/11/2017   AST 17 04/11/2017   NA 139 04/11/2017   K 3.9 04/11/2017   CL 104 04/11/2017   CREATININE 0.85 04/11/2017   BUN 12 04/11/2017   CO2 26 04/11/2017   TSH 1.57 04/11/2017   HGBA1C 5.9 (H) 04/11/2017    --------------------------------------------------------------------------------------------------------------------- No results found.   Assessment & Plan:   Violanda was seen today for hip pain.  Diagnoses and all orders for this visit:  Chronic pain syndrome  Chronic left hip pain  Chronic bilateral low back pain with bilateral sciatica  Chronic sacroiliac joint pain  Left wrist pain  Psoriasis with arthropathy (HCC)  Psoriatic arthritis (HCC)  Chronic, continuous use of opioids  Chronic pain of both shoulders  Pain in multiple finger joints  Other orders -     Discontinue: Oxycodone HCl 10 MG TABS; Take 1 tablet (10 mg total) by mouth 4 (four) times daily. -     Oxycodone HCl 10 MG TABS; Take 1 tablet (10 mg total) by mouth 4 (four) times daily.        ----------------------------------------------------------------------------------------------------------------------  Problem List Items Addressed This Visit      Unprioritized   Chronic hip pain (Chronic)   Relevant Medications   Oxycodone HCl 10 MG TABS   Chronic low back pain (Chronic)   Relevant Medications   Oxycodone HCl 10 MG TABS   Chronic pain syndrome - Primary (Chronic)   Chronic sacroiliac joint pain (Chronic)   Relevant Medications   Oxycodone HCl 10 MG TABS   Chronic shoulder pain   Relevant Medications   Oxycodone HCl 10 MG TABS   Left wrist pain (Chronic)   Pain in multiple finger joints   Psoriasis with arthropathy (HCC)     Other Visit Diagnoses    Psoriatic arthritis ([place])       Relevant Medications   Oxycodone HCl 10 MG TABS   Chronic, continuous use of opioids            ----------------------------------------------------------------------------------------------------------------------  1. Chronic pain syndrome We will continue her on her current regimen at this point.  She continues to derive good functional lifestyle improvement with the medicines I think this is reasonable to continue.  I will refill her medications for November 1 in November 30 first for the oxycodone 10 mg tablets 4 times a day.  We have gone over the risks and benefits of chronic opioid management multiple times in the past and  she understands these.  Unfortunately her pain is quite recalcitrant and severe and without the medication she reports that she cannot function at all.  He feels that they helped significantly and is not having any side effects other than the mild sedation occasionally.  2. Chronic left hip pain I want her to continue follow-up with her primary care physicians I have asked her to follow-up with her rheumatologist Dr. Norval Gable  3. Chronic bilateral low back pain with bilateral sciatica Ultimately she could be a candidate for an epidural steroid if she gets more radicular pain as discussed today however most of her pain remains more or less osteoarthritic in nature at this point.  4. Chronic sacroiliac joint pain   5. Left wrist pain   6. Psoriasis with arthropathy ([place])   7. Psoriatic arthritis ([place])   8. Chronic, continuous use of opioids Reviewed the [facility] practitioner database information and it is appropriate.  9. Chronic pain of both shoulders   10. Pain in multiple finger joints     ----------------------------------------------------------------------------------------------------------------------  I am having Aliany Eguia maintain her vitamin C, Cholecalciferol,  meloxicam, betamethasone dipropionate, fluocinonide, Ixekizumab, fluticasone, ipratropium, clobetasol, naloxone, carvedilol, carvedilol, lisinopril, digoxin, levothyroxine, venlafaxine XR, and Oxycodone HCl.   Meds ordered this encounter  Medications  . DISCONTD: Oxycodone HCl 10 MG TABS    Sig: Take 1 tablet (10 mg total) by mouth 4 (four) times daily.    Dispense:  120 tablet    Refill:  0    Do not fill until [id number]  . Oxycodone HCl 10 MG TABS    Sig: Take 1 tablet (10 mg total) by mouth 4 (four) times daily.    Dispense:  120 tablet    Refill:  0    Do not fill until [id number]   Patient's Medications  New Prescriptions   No medications on file  Previous Medications   ASCORBIC ACID (VITAMIN C) 1000 MG TABLET    Take 1,000 mg by mouth daily.   BETAMETHASONE DIPROPIONATE (DIPROLENE) 0.05 % OINTMENT    daily.    CARVEDILOL (COREG) 25 MG TABLET    Take 1 tablet (25 mg total) by mouth 2 (two) times daily with a meal.   CARVEDILOL (COREG) 6.25 MG TABLET    Take 1 tablet (6.25 mg total) by mouth 2 (two) times daily with a meal.   CHOLECALCIFEROL (VITAMIN D-1000 MAX ST) 1000 UNITS TABLET    Take by mouth daily.    CLOBETASOL (TEMOVATE) 0.05 % EXTERNAL SOLUTION    Apply 1 application topically as needed.    DIGOXIN (LANOXIN) 0.25 MG TABLET    Take 1 tablet (250 mcg total) by mouth daily.   FLUOCINONIDE (LIDEX) 0.05 % EXTERNAL SOLUTION    Apply 1 application topically daily.    FLUTICASONE (FLONASE) 50 MCG/ACT NASAL SPRAY    Place 2 sprays into both nostrils daily.   IPRATROPIUM (ATROVENT) 0.06 % NASAL SPRAY    Place 2 sprays into both nostrils 4 (four) times daily. For up to 5-7 days then stop.   IXEKIZUMAB (TALTZ) 80 MG/ML SOSY    Inject 80 mg into the skin as directed.   LEVOTHYROXINE (SYNTHROID, LEVOTHROID) 125 MCG TABLET    Take 1 tablet (125 mcg total) by mouth daily.   LISINOPRIL (PRINIVIL,ZESTRIL) 5 MG TABLET    Take 1 tablet (5 mg total) by mouth daily.   MELOXICAM (MOBIC) 7.5 MG  TABLET    Take by mouth daily.    NALOXONE (  NARCAN) NASAL SPRAY 4 MG/0.1 ML    To reverse excess sedation from narcotics   VENLAFAXINE XR (EFFEXOR-XR) 75 MG 24 HR CAPSULE    TAKE 1 CAPSULE BY MOUTH ONCE DAILY WITH  BREAKFAST  Modified Medications   Modified Medication Previous Medication   OXYCODONE HCL 10 MG TABS Oxycodone HCl 10 MG TABS      Take 1 tablet (10 mg total) by mouth 4 (four) times daily.    Take 1 tablet (10 mg total) by mouth 4 (four) times daily.  Discontinued Medications   No medications on file   ----------------------------------------------------------------------------------------------------------------------  Follow-up: Return in about 2 months (around 09/02/2018) for evaluation, med refill.    Molli Barrows, MD

## 2018-07-03 NOTE — Patient Instructions (Signed)
You have been given 2 scripts for oxycodone today.  

## 2018-07-13 ENCOUNTER — Encounter: Payer: Commercial Managed Care - PPO | Admitting: Nurse Practitioner

## 2018-07-18 ENCOUNTER — Telehealth: Payer: Self-pay | Admitting: Nurse Practitioner

## 2018-07-18 DIAGNOSIS — I5022 Chronic systolic (congestive) heart failure: Secondary | ICD-10-CM

## 2018-07-18 MED ORDER — CARVEDILOL 25 MG PO TABS: 25.0000 mg | ORAL_TABLET | Freq: Two times a day (BID) | ORAL | 1 refills | Status: DC

## 2018-07-18 NOTE — Telephone Encounter (Signed)
Duplicate medication dosage.  Continued carvedilol 25 mg bid after verification with patient about current dosage.

## 2018-07-18 NOTE — Telephone Encounter (Signed)
Pt needs 30 day refill on coreg sent to [facility].

## 2018-07-20 ENCOUNTER — Other Ambulatory Visit: Payer: Self-pay

## 2018-07-20 ENCOUNTER — Ambulatory Visit (INDEPENDENT_AMBULATORY_CARE_PROVIDER_SITE_OTHER): Payer: Commercial Managed Care - PPO | Admitting: Nurse Practitioner

## 2018-07-20 ENCOUNTER — Other Ambulatory Visit (HOSPITAL_COMMUNITY)
Admission: RE | Admit: 2018-07-20 | Discharge: 2018-07-20 | Disposition: A | Discharge status: Home / Self Care | Payer: Commercial Managed Care - PPO | Source: Ambulatory Visit | Attending: Nurse Practitioner | Admitting: Nurse Practitioner

## 2018-07-20 ENCOUNTER — Encounter: Payer: Self-pay | Admitting: Nurse Practitioner

## 2018-07-20 VITALS — BP 162/74 | HR 88 | Ht 70.0 in | Wt 176.4 lb

## 2018-07-20 DIAGNOSIS — K219 Gastro-esophageal reflux disease without esophagitis: Secondary | ICD-10-CM | POA: Diagnosis not present

## 2018-07-20 DIAGNOSIS — Z1239 Encounter for other screening for malignant neoplasm of breast: Secondary | ICD-10-CM

## 2018-07-20 DIAGNOSIS — Z124 Encounter for screening for malignant neoplasm of cervix: Secondary | ICD-10-CM | POA: Insufficient documentation

## 2018-07-20 DIAGNOSIS — Z1211 Encounter for screening for malignant neoplasm of colon: Secondary | ICD-10-CM

## 2018-07-20 DIAGNOSIS — Z0000 Encounter for general adult medical examination without abnormal findings: Secondary | ICD-10-CM

## 2018-07-20 DIAGNOSIS — F5101 Primary insomnia: Secondary | ICD-10-CM

## 2018-07-20 DIAGNOSIS — Z23 Encounter for immunization: Secondary | ICD-10-CM

## 2018-07-20 MED ORDER — OMEPRAZOLE 20 MG PO CPDR: 20.0000 mg | DELAYED_RELEASE_CAPSULE | Freq: Every day | ORAL | 2 refills | Status: DC

## 2018-07-20 MED ORDER — TRAZODONE HCL 50 MG PO TABS: 25.0000 mg | ORAL_TABLET | Freq: Every evening | ORAL | 3 refills | Status: DC | PRN

## 2018-07-20 NOTE — Patient Instructions (Addendum)
Chapel Catherman,   Thank you for coming in to clinic today.  1. For sleep:  - START trazodone 25-50 mg about 30 minutes before bed for sleep.  Sleep Hygiene Tips  Take medicines only as directed by your health care provider.  Keep regular sleeping and waking hours. Avoid naps.  Keep a sleep diary to help you and your health care provider figure out what could be causing your insomnia. Include:  When you sleep.  When you wake up during the night.  How well you sleep.  How rested you feel the next day.  Any side effects of medicines you are taking.  What you eat and drink.  Make your bedroom a comfortable place where it is easy to fall asleep:  Put up shades or special blackout curtains to block light from outside.  Use a white noise machine to block noise.  Keep the temperature cool.  Exercise regularly as directed by your health care provider. Avoid exercising right before bedtime.  Use relaxation techniques to manage stress. Ask your health care provider to suggest some techniques that may work well for you. These may include:  Breathing exercises.  Routines to release muscle tension.  Visualizing peaceful scenes.  Cut back on alcohol, caffeinated beverages, and cigarettes, especially close to bedtime. These can disrupt your sleep.  Do not overeat or eat spicy foods right before bedtime. This can lead to digestive discomfort that can make it hard for you to sleep.  Limit screen use before bedtime. This includes:  Watching TV.  Using your smartphone, tablet, and computer.  Stick to a routine. This can help you fall asleep faster. Try to do a quiet activity, brush your teeth, and go to bed at the same time each night.  Get out of bed if you are still awake after 15 minutes of trying to sleep. Keep the lights down, but try reading or doing a quiet activity. When you feel sleepy, go back to bed.  Make sure that you drive carefully. Avoid driving if you feel very  sleepy.  Keep all follow-up appointments as directed by your health care provider. This is important.  2. Colon Cancer Screening: - For all adults age 53 and older, routine colon cancer screening is highly recommended. - Early detection of colon cancer is important, because often there are no warning signs or symptoms.  If colon cancer is found early, usually it can be cured. Advanced cancer is hard to treat.  - If you are not interested in Colonoscopy screening (if done and normal you could be cleared for 5 to 10 years until next due), then Cologuard is an excellent alternative for screening test for Colon Cancer. It is highly sensitive for detecting DNA of colon cancer from even the earliest stages. Also, there is NO bowel prep required. - If Cologuard is NEGATIVE, then it is good for 3 years before next due - If Cologuard is POSITIVE, then it is strongly advised to get a Colonoscopy, which allows the GI doctor to locate the source of the cancer or polyp (even very early stage) and treat it by removing it. ------------------------- If you would like to proceed with Cologuard (stool DNA test) - FIRST, call your insurance company and tell them you want to check cost of Cologuard tell them CPT Code [phone] (it may be completely covered with a small or no cost, OR max cost without any coverage is about $600). If you do NOT open the kit, and decide not  to do the test, you will NOT be charged, you should contact the company to return the kit if you decide not to do the test. - The test kit will be delivered to your house in about 1 week. Follow instructions to collect your stool sample.  You may call the company for any help or questions, 24/7 telephone support at [phone].  3. Your mammogram order has been placed.  Call the Scheduling phone number at [phone] to schedule your mammogram at your convenience.  You can choose to go to either location listed below.  Let the scheduler know which  location you prefer.  [place]  [place], [place] [address]   [facility] [address] [place],  [address]   4. Shingrix - Shingles vaccine: 2 doses 2-6 months apart.  http://www.wolf.info/ for more information if you have questions.  - Schedule a CMA vaccine visit at any time when you want to receive this.  Please schedule a follow-up appointment with [name], AGNP. Return in about 1 year (around 07/21/2019) for annual physical.  If you have any other questions or concerns, please feel free to call the clinic or send a message through [place]. You may also schedule an earlier appointment if necessary.  You will receive a survey after today's visit either digitally by e-mail or paper by [employer]. Your experiences and feedback matter to [place].  Please respond so we know how we are doing as we provide care for you.   [name], DNP, AGNP-BC Adult Gerontology Nurse Practitioner [place]

## 2018-07-20 NOTE — Progress Notes (Signed)
Subjective:    Patient ID: Krista Kennedy, female    DOB: 11/03/1965, 52 y.o.   MRN: [id number]  Krista Kennedy is a 52 y.o. female presenting on 07/20/2018 for Annual Exam   HPI Annual Physical Exam Patient has been feeling well.  They have no acute concerns today. Sleeps poorly generally (tosses and turns for hours, but is chronic).  Sleeps 3-3.5 hours per night interrupted.  Makes up for this for daytime sleeping when able.  Takes sleep aids OTC - Benadryl.  HEALTH MAINTENANCE: Weight/BMI: stable Physical activity: regular Diet: regular diet Seatbelt: always Sunscreen: on face regularly PAP: due Mammogram: due Colon Cancer Screen: desires colonsocopy HIV/HEP C: declines Optometry: Regular Dentistry: Regular  VACCINES: Tetanus: 2016 Influenza: due Shingles: due - will defer  Past Medical History:  Diagnosis Date  . Arthritis   . Cardiomyopathy ([place])   . CHF (congestive heart failure) ([place])   . Hyperlipidemia   . Hypertension   . Psoriasis   . Thyroid disease    Past Surgical History:  Procedure Laterality Date  . ENDOMETRIAL ABLATION  2015  . THYROIDECTOMY    . TONSILLECTOMY    . TUBAL LIGATION     Social History   Socioeconomic History  . Marital status: Married    Spouse name: Not on file  . Number of children: Not on file  . Years of education: Not on file  . Highest education level: Not on file  Occupational History  . Not on file  Social Needs  . Financial resource strain: Not on file  . Food insecurity:    Worry: Not on file    Inability: Not on file  . Transportation needs:    Medical: Not on file    Non-medical: Not on file  Tobacco Use  . Smoking status: Former Smoker    Packs/day: 0.25    Years: 15.00    Pack years: 3.75    Types: Cigarettes  . Smokeless tobacco: Former Network engineer and Sexual Activity  . Alcohol use: No  . Drug use: No  . Sexual activity: Yes  Lifestyle  . Physical activity:    Days per week: Not on file    Minutes per session: Not on file  . Stress: Not on file  Relationships  . Social connections:    Talks on phone: Not on file    Gets together: Not on file    Attends religious service: Not on file    Active member of club or organization: Not on file    Attends meetings of clubs or organizations: Not on file    Relationship status: Not on file  . Intimate partner violence:    Fear of current or ex partner: Not on file    Emotionally abused: Not on file    Physically abused: Not on file    Forced sexual activity: Not on file  Other Topics Concern  . Not on file  Social History Narrative  . Not on file   Family History  Problem Relation Age of Onset  . Hypertension Mother   . Hyperlipidemia Mother   . Heart disease Father        CABG   . Hyperlipidemia Father   . Hypertension Father    Current Outpatient Medications on File Prior to Visit  Medication Sig  . Ascorbic Acid (VITAMIN C) 1000 MG tablet Take 1,000 mg by mouth daily.  . betamethasone dipropionate (DIPROLENE) 0.05 % ointment daily.   . carvedilol (COREG)  25 MG tablet Take 1 tablet (25 mg total) by mouth 2 (two) times daily with a meal.  . Cholecalciferol (VITAMIN D-1000 MAX ST) 1000 UNITS tablet Take by mouth daily.   . clobetasol (TEMOVATE) 0.05 % external solution Apply 1 application topically as needed.   . digoxin (LANOXIN) 0.25 MG tablet Take 1 tablet (250 mcg total) by mouth daily.  . fluocinonide (LIDEX) 0.05 % external solution Apply 1 application topically daily.   . fluticasone (FLONASE) 50 MCG/ACT nasal spray Place 2 sprays into both nostrils daily.  Marland Kitchen ipratropium (ATROVENT) 0.06 % nasal spray Place 2 sprays into both nostrils 4 (four) times daily. For up to 5-7 days then stop.  . Ixekizumab (TALTZ) 80 MG/ML SOSY Inject 80 mg into the skin as directed.  Marland Kitchen levothyroxine (SYNTHROID, LEVOTHROID) 125 MCG tablet Take 1 tablet (125 mcg total) by mouth daily.  Marland Kitchen lisinopril (PRINIVIL,ZESTRIL) 5 MG tablet Take 1  tablet (5 mg total) by mouth daily.  . Oxycodone HCl 10 MG TABS Take 1 tablet (10 mg total) by mouth 4 (four) times daily.  Marland Kitchen venlafaxine XR (EFFEXOR-XR) 75 MG 24 hr capsule TAKE 1 CAPSULE BY MOUTH ONCE DAILY WITH  BREAKFAST  . meloxicam (MOBIC) 7.5 MG tablet Take by mouth daily.   . naloxone (NARCAN) nasal spray 4 mg/0.1 mL To reverse excess sedation from narcotics (Patient not taking: Reported on 07/20/2018)   No current facility-administered medications on file prior to visit.     Review of Systems  Constitutional: Negative for chills and fever.  HENT: Negative for congestion and sore throat.   Eyes: Negative for pain.  Respiratory: Negative for cough, shortness of breath and wheezing.   Cardiovascular: Negative for chest pain, palpitations and leg swelling.  Gastrointestinal: Positive for abdominal pain (generalized, chronic - previously aided by PPI). Negative for blood in stool, constipation, diarrhea, nausea and vomiting.  Endocrine: Negative for polydipsia.  Genitourinary: Negative for dysuria, frequency, hematuria and urgency.  Musculoskeletal: Negative for back pain, myalgias and neck pain.  Skin: Positive for rash (psoriasis darkening/discoloration w/o flakiness, on Taltz).  Allergic/Immunologic: Negative for environmental allergies.  Neurological: Negative for dizziness, weakness and headaches.  Hematological: Does not bruise/bleed easily.  Psychiatric/Behavioral: Positive for sleep disturbance. Negative for dysphoric mood and suicidal ideas. The patient is not nervous/anxious.    Per HPI unless specifically indicated above     Objective:    BP (!) 162/74 (BP Location: Right Arm, Patient Position: Sitting, Cuff Size: Normal)   Pulse 88   Ht 5\' 10"  (1.778 m)   Wt 176 lb 6.4 oz (80 kg)   BMI 25.31 kg/m   Wt Readings from Last 3 Encounters:  07/20/18 176 lb 6.4 oz (80 kg)  07/03/18 160 lb (72.6 kg)  06/15/18 177 lb (80.3 kg)    Physical Exam  Constitutional: She is  oriented to person, place, and time. She appears well-developed and well-nourished. No distress.  HENT:  Head: Normocephalic and atraumatic.  Right Ear: External ear normal.  Left Ear: External ear normal.  Nose: Nose normal.  Mouth/Throat: Oropharynx is clear and moist.  Eyes: Pupils are equal, round, and reactive to light. Conjunctivae are normal.  Neck: Normal range of motion. Neck supple. No JVD present. No tracheal deviation present. No thyromegaly present.  Cardiovascular: Normal rate, regular rhythm, normal heart sounds and intact distal pulses. Exam reveals no gallop and no friction rub.  No murmur heard. Pulmonary/Chest: Effort normal and breath sounds normal. No respiratory distress.  Breast -  Normal exam w/ symmetric breasts, no mass, no nipple discharge, no skin changes or tenderness.   Abdominal: Soft. Bowel sounds are normal. She exhibits no distension. There is no hepatosplenomegaly. There is tenderness (generalized lower abdominal tenderness) in the right lower quadrant and left lower quadrant.  Genitourinary:  Genitourinary Comments: Normal external female genitalia without lesions or fusion. Vaginal canal without lesions. Normal appearing cervix without lesions or friability. Physiologic discharge on exam. Bimanual exam without adnexal masses, enlarged uterus, or cervical motion tenderness.  Musculoskeletal: Normal range of motion.  Lymphadenopathy:    She has no cervical adenopathy.  Neurological: She is alert and oriented to person, place, and time. No cranial nerve deficit.  Skin: Skin is warm and dry. Capillary refill takes less than 2 seconds.  Psychiatric: She has a normal mood and affect. Her behavior is normal. Judgment and thought content normal.  Nursing note and vitals reviewed.   Results for orders placed or performed in visit on 09/06/17  ToxASSURE Select 13 (MW), Urine  Result Value Ref Range   Summary FINAL       Assessment & Plan:   Problem List  Items Addressed This Visit      Digestive   GERD (gastroesophageal reflux disease) Likely functional dyspepsia, chronic abdominal pain that was previously relieved by PPI.  Plan: 1. START omeprazole 20 mg once daily. 2. Follow-up 3 months and prn.   Relevant Medications   omeprazole (PRILOSEC) 20 MG capsule     Other   Insomnia Chronic and likely primary insomnia.  Patient with chronic use of benadryl and OTC sleep aids without relief.   Plan: ]1. Start trazodone 25-50 mg once daily about 30 mins before bedtime. 2. Encouraged ongoing good sleep hygiene. 3. Follow-up 3-6 months and prn.   Relevant Medications   traZODone (DESYREL) 50 MG tablet    Other Visit Diagnoses    Needs flu shot    Pt < age 19.  Needs annual influenza vaccine.  Plan: 1. Administer Quad flu vaccine.    Relevant Orders   Flu Vaccine QUAD 6+ mos PF IM (Fluarix Quad PF) (Completed)   Annual physical exam     -  Primary Annual physical exam with new findings of insomnia, Jerrye Bushy (see above).  Well adult with no other acute concerns.  Plan: 1. Obtain health maintenance screenings as above according to age. - Increase physical activity to 30 minutes most days of the week.  - Eat healthy diet high in vegetables and fruits; low in refined carbohydrates. - Encouraged flu, shingles vaccines - Cervical, breast, colon cancer screenings due and orderd. 2. Return 1 year for annual physical.     Relevant Orders   MM DIGITAL SCREENING BILATERAL   Cologuard   Cytology - PAP   COMPLETE METABOLIC PANEL WITH GFR   CBC with Differential/Platelet   Hemoglobin A1c   Lipid panel   TSH   Colon cancer screening       Relevant Orders   Cologuard   Breast cancer screening       Relevant Orders   MM DIGITAL SCREENING BILATERAL   Cervical cancer screening       Relevant Orders   Cytology - PAP      Meds ordered this encounter  Medications  . traZODone (DESYREL) 50 MG tablet    Sig: Take 0.5-1 tablets (25-50 mg  total) by mouth at bedtime as needed for sleep.    Dispense:  30 tablet    Refill:  3  Order Specific Question:   Supervising Provider    Answer:   Olin Hauser [[id number]]    Follow up plan: Return in about 1 year (around 07/21/2019) for annual physical.  [name], DNP, AGPCNP-BC Adult Gerontology Primary Care Nurse Practitioner [facility] 07/20/2018, 9:22 AM

## 2018-07-21 LAB — CBC WITH DIFFERENTIAL/PLATELET
Basophils Absolute: 57 cells/uL (ref 0–200)
Basophils Relative: 0.7 %
Eosinophils Absolute: 138 cells/uL (ref 15–500)
Eosinophils Relative: 1.7 %
HCT: 40.7 % (ref 35.0–45.0)
Hemoglobin: 13.6 g/dL (ref 11.7–15.5)
Lymphs Abs: 2074 cells/uL (ref 850–3900)
MCH: 28.7 pg (ref 27.0–33.0)
MCHC: 33.4 g/dL (ref 32.0–36.0)
MCV: 85.9 fL (ref 80.0–100.0)
MPV: 9.9 fL (ref 7.5–12.5)
Monocytes Relative: 8.3 %
Neutro Abs: 5160 cells/uL (ref 1500–7800)
Neutrophils Relative %: 63.7 %
Platelets: 241 10*3/uL (ref 140–400)
RBC: 4.74 10*6/uL (ref 3.80–5.10)
RDW: 12.5 % (ref 11.0–15.0)
Total Lymphocyte: 25.6 %
WBC mixed population: 672 cells/uL (ref 200–950)
WBC: 8.1 10*3/uL (ref 3.8–10.8)

## 2018-07-21 LAB — LIPID PANEL
Cholesterol: 194 mg/dL (ref <200)
HDL: 41 mg/dL — ABNORMAL LOW (ref >50)
LDL Cholesterol (Calc): 129 mg/dL (calc) — ABNORMAL HIGH
Non-HDL Cholesterol (Calc): 153 mg/dL (calc) — ABNORMAL HIGH (ref <130)
Total CHOL/HDL Ratio: 4.7 (calc) (ref <5.0)
Triglycerides: 128 mg/dL (ref <150)

## 2018-07-21 LAB — TSH: TSH: 0.03 mIU/L — ABNORMAL LOW

## 2018-07-21 LAB — COMPLETE METABOLIC PANEL WITH GFR
AG Ratio: 1.3 (calc) (ref 1.0–2.5)
ALT: 22 U/L (ref 6–29)
AST: 19 U/L (ref 10–35)
Albumin: 4 g/dL (ref 3.6–5.1)
Alkaline phosphatase (APISO): 100 U/L (ref 33–130)
BUN: 13 mg/dL (ref 7–25)
CO2: 28 mmol/L (ref 20–32)
Calcium: 8.7 mg/dL (ref 8.6–10.4)
Chloride: 103 mmol/L (ref 98–110)
Creat: 0.93 mg/dL (ref 0.50–1.05)
GFR, Est African American: 82 mL/min/{1.73_m2} (ref >=60)
GFR, Est Non African American: 71 mL/min/{1.73_m2} (ref >=60)
Globulin: 3.2 g/dL (calc) (ref 1.9–3.7)
Glucose, Bld: 140 mg/dL — ABNORMAL HIGH (ref 65–99)
Potassium: 3.6 mmol/L (ref 3.5–5.3)
Sodium: 140 mmol/L (ref 135–146)
Total Bilirubin: 0.6 mg/dL (ref 0.2–1.2)
Total Protein: 7.2 g/dL (ref 6.1–8.1)

## 2018-07-21 LAB — HEMOGLOBIN A1C
Hgb A1c MFr Bld: 6.7 % of total Hgb — ABNORMAL HIGH (ref <5.7)
Mean Plasma Glucose: 146 (calc)
eAG (mmol/L): 8.1 (calc)

## 2018-07-24 ENCOUNTER — Other Ambulatory Visit: Payer: Self-pay | Admitting: Nurse Practitioner

## 2018-07-24 DIAGNOSIS — E119 Type 2 diabetes mellitus without complications: Secondary | ICD-10-CM

## 2018-07-24 DIAGNOSIS — E034 Atrophy of thyroid (acquired): Secondary | ICD-10-CM

## 2018-07-24 DIAGNOSIS — E079 Disorder of thyroid, unspecified: Secondary | ICD-10-CM

## 2018-07-24 MED ORDER — METFORMIN HCL 500 MG PO TABS: 500.0000 mg | ORAL_TABLET | Freq: Two times a day (BID) | ORAL | 3 refills | Status: DC

## 2018-07-24 MED ORDER — LEVOTHYROXINE SODIUM 100 MCG PO TABS: 100.0000 ug | ORAL_TABLET | Freq: Every day | ORAL | 2 refills | Status: DC

## 2018-07-25 LAB — CYTOLOGY - PAP
Diagnosis: UNDETERMINED — AB
HPV 16/18/45 genotyping: NEGATIVE
HPV: DETECTED — AB

## 2018-08-20 NOTE — Progress Notes (Signed)
Cardiology Office Note  Date:  08/21/2018   ID:  Krista Kennedy, DOB [birth year], MRN [id number]  PCP:  Olin Hauser, DO   Chief Complaint  Patient presents with  . New Patient (Initial Visit)    Chronic systolic congestive heart failure. SOB with exertion and CP when inactive off and on.Medications reviewed verbally.     HPI:  [name] is a 52 year old woman with past medical history of Dilated cardiomyopathy/peripartum in 2002 EF 60% in 2014 Insomnia Former smoker GERD HTN Referral by [name] for consultation of her  CHF, palpitations  Peripartum cardiomyopathy/  Chronic systolic CHF -  Dx 5009 after the birth of her son , EF 25% originally -with associated lower extremity edema weight gain shortness of breath most recently EF 56s  Evaluated by cardiology in 2014 Normal echo in 2014 At that time reported: "progressive shortness breath occurs doing her housework-vacuuming or other light housework.  She's never felt well since her original diagnosis in 2002. She now has trouble doing her normal activities.  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  PSH:    Past Surgical History:  Procedure Laterality Date  . ENDOMETRIAL ABLATION  2015  . THYROIDECTOMY    . TONSILLECTOMY    . TUBAL LIGATION      Current Outpatient Medications  Medication Sig Dispense Refill  . Ascorbic Acid (VITAMIN C) 1000 MG tablet Take  1,000 mg by mouth daily.    . betamethasone dipropionate (DIPROLENE) 0.05 % ointment daily.   0  . carvedilol (COREG) 25 MG tablet Take 1 tablet (25 mg total) by mouth 2 (two) times daily with a meal. 180 tablet 1  . Cholecalciferol (VITAMIN D-1000 MAX ST) 1000 UNITS tablet Take by mouth daily.     . clobetasol (TEMOVATE) 0.05 % external solution Apply 1 application topically as needed.     . fluocinonide (LIDEX) 0.05 % external solution Apply 1 application topically daily.   0  . fluticasone (FLONASE) 50 MCG/ACT nasal spray Place 2 sprays into both nostrils daily. 16 g 3  . ipratropium (ATROVENT) 0.06 % nasal spray Place 2 sprays into both nostrils 4 (four) times daily. For up to 5-7 days then stop. 15 mL 0  . Ixekizumab (TALTZ) 80 MG/ML SOSY Inject 80 mg into the skin as directed.    Marland Kitchen levothyroxine (SYNTHROID, LEVOTHROID) 100 MCG tablet Take 1 tablet (100 mcg total) by mouth daily. 30 tablet 2  . lisinopril (PRINIVIL,ZESTRIL) 20 MG tablet Take 1 tablet (20 mg total) by mouth daily. 90 tablet 3  . metFORMIN (GLUCOPHAGE) 500 MG tablet Take 1 tablet (500 mg total) by mouth 2 (two) times daily with a meal. 180 tablet 3  . naloxone (NARCAN) nasal spray 4 mg/0.1 mL To reverse excess sedation from narcotics 2 kit 1  . omeprazole (PRILOSEC) 20 MG capsule Take 1 capsule (20 mg total) by mouth daily. 30 capsule 2  . Oxycodone HCl 10 MG TABS  Take 1 tablet (10 mg total) by mouth 4 (four) times daily. 120 tablet 0  . venlafaxine XR (EFFEXOR-XR) 75 MG 24 hr capsule TAKE 1 CAPSULE BY MOUTH ONCE DAILY WITH  BREAKFAST 30 capsule 0   No current facility-administered medications for this visit.      Allergies:   Sulfa antibiotics and Sulfacetamide sodium   Social History:  The patient  reports that she has quit smoking. Her smoking use included cigarettes. She has a 3.75 pack-year smoking history. She has quit using smokeless tobacco. She reports that she does not drink alcohol or use drugs.   Family  History:   family history includes Heart disease in her father; Hyperlipidemia in her father and mother; Hypertension in her father and mother.    Review of Systems: Review of Systems  Constitutional: Negative.   Respiratory: Negative.   Cardiovascular: Negative.   Gastrointestinal: Negative.   Musculoskeletal: Negative.   Neurological: Negative.   Psychiatric/Behavioral: Negative.   All other systems reviewed and are negative.    PHYSICAL EXAM: VS:  BP (!) 150/90 (BP Location: Right Arm, Patient Position: Sitting, Cuff Size: Normal)   Pulse 82   Ht '5\' 10"'$  (1.778 m)   Wt 177 lb (80.3 kg)   BMI 25.40 kg/m  , BMI Body mass index is 25.4 kg/m. GEN: Well nourished, well developed, in no acute distress  HEENT: normal  Neck: no JVD, carotid bruits, or masses Cardiac: RRR; no murmurs, rubs, or gallops,no edema  Respiratory:  clear to auscultation bilaterally, normal work of breathing GI: soft, nontender, nondistended, + BS MS: no deformity or atrophy  Skin: warm and dry, no rash Neuro:  Strength and sensation are intact Psych: euthymic mood, full affect    Recent Labs: 07/20/2018: ALT 22; BUN 13; Creat 0.93; Hemoglobin 13.6; Platelets 241; Potassium 3.6; Sodium 140; TSH 0.03    Lipid Panel Lab Results  Component Value Date   CHOL 194 07/20/2018   HDL 41 (L) 07/20/2018   LDLCALC 129 (H) 07/20/2018   TRIG 128 07/20/2018      Wt Readings from Last 3 Encounters:  08/21/18 177 lb (80.3 kg)  07/20/18 176 lb 6.4 oz (80 kg)  07/03/18 160 lb (72.6 kg)       ASSESSMENT AND PLAN:  History of cardiomyopathy - Plan: EKG 12-Lead Remote history of cardiomyopathy 2002 Recovered from postpartum cardiomyopathy, normal ejection fraction several years ago Does not appear to have regressed, appears euvolemic Recommend she hold the digoxin Discussed clinical features to watch for for signs of worsening heart failure such as leg swelling, abdominal bloating, weight  gain  Essential hypertension - Plan: EKG 12-Lead, lisinopril (PRINIVIL,ZESTRIL) 20 MG tablet Reports blood pressure running high at home We will increase lisinopril up to 20 mg daily If blood pressure continues to run high , may need 40 mg daily  Shortness of breath Very mild shortness of breath seems to come and go, otherwise good exercise tolerance Works at [employer] on her feet all day No further testing ordered  Palpitations Likely APCs or PVCs Seem to have resolved Discussed if symptoms get worse could try extra carvedilol or even propranolol 10 to 20 mg as needed Also if symptoms return could order a extended monitor/Zio She will call us if she would like any of these  Disposition:   F/U as needed  Patient was seen in consultation for [name] and will be referred back to her office for ongoing care of the issues detailed above  Total encounter time more than 60 minutes  Greater than 50% was spent in counseling and coordination of care with the patient    Orders Placed This Encounter  Procedures  . EKG 12-Lead     Signed, Esmond Plants, M.D., Ph.D. 08/21/2018  [place], [place]

## 2018-08-21 ENCOUNTER — Encounter: Payer: Self-pay | Admitting: Cardiovascular Disease

## 2018-08-21 ENCOUNTER — Encounter

## 2018-08-21 ENCOUNTER — Ambulatory Visit: Payer: Commercial Managed Care - PPO | Admitting: Cardiovascular Disease

## 2018-08-21 VITALS — BP 150/90 | HR 82 | Ht 70.0 in | Wt 177.0 lb

## 2018-08-21 DIAGNOSIS — Z8679 Personal history of other diseases of the circulatory system: Secondary | ICD-10-CM | POA: Diagnosis not present

## 2018-08-21 DIAGNOSIS — R002 Palpitations: Secondary | ICD-10-CM | POA: Insufficient documentation

## 2018-08-21 DIAGNOSIS — I10 Essential (primary) hypertension: Secondary | ICD-10-CM | POA: Diagnosis not present

## 2018-08-21 DIAGNOSIS — R0602 Shortness of breath: Secondary | ICD-10-CM | POA: Diagnosis not present

## 2018-08-21 MED ORDER — LISINOPRIL 20 MG PO TABS: 20.0000 mg | ORAL_TABLET | Freq: Every day | ORAL | 3 refills | Status: DC

## 2018-08-21 NOTE — Patient Instructions (Signed)
Medication Instructions:   Stop the digoxin  Increase the lisinopril up to 20 mg daily Monitor BP If high, call the office   If palpitations come back, Call the office We would start propranolol 10 to 20 mg as needed 4 to 6 hour pill, B-blocker for speed control  If you need a refill on your cardiac medications before your next appointment, please call your pharmacy.    Lab work: No new labs needed   If you have labs (blood work) drawn today and your tests are completely normal, you will receive your results only by: Marland Kitchen MyChart Message (if you have MyChart) OR . A paper copy in the mail If you have any lab test that is abnormal or we need to change your treatment, we will call you to review the results.   Testing/Procedures: No new testing needed   Follow-Up: At [facility], you and your health needs are our priority.  As part of our continuing mission to provide you with exceptional heart care, we have created designated Provider Care Teams.  These Care Teams include your primary Cardiologist (physician) and Advanced Practice Providers (APPs -  Physician Assistants and Nurse Practitioners) who all work together to provide you with the care you need, when you need it.  . You will need a follow up appointment as needed  . Providers on your designated Care Team:   . Murray Hodgkins, NP . Christell Faith, PA-C . Marrianne Mood, PA-C  Any Other Special Instructions Will Be Listed Below (If Applicable).  For educational health videos Log in to : [email or web address] Or : [email or web address], password : [email or web address]

## 2018-08-30 ENCOUNTER — Ambulatory Visit: Payer: Commercial Managed Care - PPO | Attending: Anesthesiology | Admitting: Anesthesiology

## 2018-08-30 ENCOUNTER — Other Ambulatory Visit: Payer: Self-pay

## 2018-08-30 ENCOUNTER — Encounter: Payer: Self-pay | Admitting: Anesthesiology

## 2018-08-30 VITALS — BP 160/98 | HR 87 | Temp 97.8°F | Resp 18 | Ht 70.0 in | Wt 170.0 lb

## 2018-08-30 DIAGNOSIS — L4050 Arthropathic psoriasis, unspecified: Secondary | ICD-10-CM | POA: Diagnosis not present

## 2018-08-30 DIAGNOSIS — M545 Low back pain: Secondary | ICD-10-CM | POA: Diagnosis not present

## 2018-08-30 DIAGNOSIS — M533 Sacrococcygeal disorders, not elsewhere classified: Secondary | ICD-10-CM | POA: Insufficient documentation

## 2018-08-30 DIAGNOSIS — M25552 Pain in left hip: Secondary | ICD-10-CM | POA: Diagnosis not present

## 2018-08-30 DIAGNOSIS — M25511 Pain in right shoulder: Secondary | ICD-10-CM | POA: Insufficient documentation

## 2018-08-30 DIAGNOSIS — G8929 Other chronic pain: Secondary | ICD-10-CM

## 2018-08-30 DIAGNOSIS — M25542 Pain in joints of left hand: Secondary | ICD-10-CM | POA: Insufficient documentation

## 2018-08-30 DIAGNOSIS — G894 Chronic pain syndrome: Secondary | ICD-10-CM

## 2018-08-30 DIAGNOSIS — F1190 Opioid use, unspecified, uncomplicated: Secondary | ICD-10-CM

## 2018-08-30 DIAGNOSIS — Z79899 Other long term (current) drug therapy: Secondary | ICD-10-CM | POA: Diagnosis not present

## 2018-08-30 DIAGNOSIS — M25541 Pain in joints of right hand: Secondary | ICD-10-CM | POA: Insufficient documentation

## 2018-08-30 DIAGNOSIS — M25512 Pain in left shoulder: Secondary | ICD-10-CM | POA: Diagnosis not present

## 2018-08-30 DIAGNOSIS — Z79891 Long term (current) use of opiate analgesic: Secondary | ICD-10-CM | POA: Diagnosis not present

## 2018-08-30 DIAGNOSIS — M25549 Pain in joints of unspecified hand: Secondary | ICD-10-CM

## 2018-08-30 MED ORDER — OXYCODONE HCL 10 MG PO TABS: 10.0000 mg | ORAL_TABLET | Freq: Four times a day (QID) | ORAL | 0 refills | Status: DC

## 2018-08-30 MED ORDER — OXYCODONE HCL 10 MG PO TABS: 10.0000 mg | ORAL_TABLET | Freq: Four times a day (QID) | ORAL | 0 refills | Status: AC

## 2018-08-30 NOTE — Progress Notes (Signed)
Nursing Pain Medication Assessment:  Safety precautions to be maintained throughout the outpatient stay will include: orient to surroundings, keep bed in low position, maintain call bell within reach at all times, provide assistance with transfer out of bed and ambulation.  Medication Inspection Compliance: Pill count conducted under aseptic conditions, in front of the patient. Neither the pills nor the bottle was removed from the patient's sight at any time. Once count was completed pills were immediately returned to the patient in their original bottle.  Medication: Oxycodone IR Pill/Patch Count: 3 of 120 pills remain Pill/Patch Appearance: Markings consistent with prescribed medication Bottle Appearance: Standard pharmacy container. Clearly labeled. Filled Date: 61 / 02 / 2019 Last Medication intake:  Today

## 2018-08-30 NOTE — Patient Instructions (Addendum)
You were given oxycodone hcl 10 x 2 months to fill on 08/31/18 and 09/30/18.   UDS collected.

## 2018-08-30 NOTE — Progress Notes (Signed)
Subjective:  Patient ID: Krista Kennedy, female    DOB: [birth year]  Age: 52 y.o. MRN: [id number]  CC: Hip Pain (left)   Procedure: None  HPI [name] presents for reevaluation last seen 2 months ago.  Unfortunately she has continued to have the left side hip pain that is been chronic for her at this point.  She was seen in the emergency room for evaluation of this in the not too distant past.  She was given some Toradol and felt that this did not help much and discontinued it.  Furthermore she has been taking her oxycodone 10 mg tablets 4 times a day and occasionally has been taking 1/5 tablet to get additional relief for the hip pain.  Based on her narcotic assessment sheet she is continuing to do well with the medicines and getting good relief for her generalized rheumatologic pain without side effect reported.  However the left hip pain has been challenging and she was taking Goody powder or naproxen to assist with this.  She is questioning whether she can increase her opioid dosing.  No change in lower extremity strength or function bowel bladder function is noted at this time.  She denies any diverting or illicit use.  Outpatient Medications Prior to Visit  Medication Sig Dispense Refill  . Ascorbic Acid (VITAMIN C) 1000 MG tablet Take 1,000 mg by mouth daily.    . betamethasone dipropionate (DIPROLENE) 0.05 % ointment daily.   0  . carvedilol (COREG) 25 MG tablet Take 1 tablet (25 mg total) by mouth 2 (two) times daily with a meal. 180 tablet 1  . Cholecalciferol (VITAMIN D-1000 MAX ST) 1000 UNITS tablet Take by mouth daily.     . clobetasol (TEMOVATE) 0.05 % external solution Apply 1 application topically as needed.     . fluocinonide (LIDEX) 0.05 % external solution Apply 1 application topically daily.   0  . fluticasone (FLONASE) 50 MCG/ACT nasal spray Place 2 sprays into both nostrils daily. 16 g 3  . ipratropium (ATROVENT) 0.06 % nasal spray Place 2 sprays into both nostrils 4  (four) times daily. For up to 5-7 days then stop. 15 mL 0  . Ixekizumab (TALTZ) 80 MG/ML SOSY Inject 80 mg into the skin as directed.    Marland Kitchen levothyroxine (SYNTHROID, LEVOTHROID) 100 MCG tablet Take 1 tablet (100 mcg total) by mouth daily. 30 tablet 2  . lisinopril (PRINIVIL,ZESTRIL) 20 MG tablet Take 1 tablet (20 mg total) by mouth daily. 90 tablet 3  . metFORMIN (GLUCOPHAGE) 500 MG tablet Take 1 tablet (500 mg total) by mouth 2 (two) times daily with a meal. 180 tablet 3  . naloxone (NARCAN) nasal spray 4 mg/0.1 mL To reverse excess sedation from narcotics 2 kit 1  . omeprazole (PRILOSEC) 20 MG capsule Take 1 capsule (20 mg total) by mouth daily. 30 capsule 2  . venlafaxine XR (EFFEXOR-XR) 75 MG 24 hr capsule TAKE 1 CAPSULE BY MOUTH ONCE DAILY WITH  BREAKFAST 30 capsule 0  . Oxycodone HCl 10 MG TABS Take 1 tablet (10 mg total) by mouth 4 (four) times daily. 120 tablet 0   No facility-administered medications prior to visit.     Review of Systems CNS: No confusion or sedation Cardiac: No angina or palpitations GI: No abdominal pain or constipation Constitutional: No nausea vomiting fevers or chills  Objective:  BP (!) 160/98   Pulse 87   Temp 97.8 F (36.6 C)   Resp 18   Ht 5'  10" (1.778 m)   Wt 170 lb (77.1 kg)   SpO2 98%   BMI 24.39 kg/m    BP Readings from Last 3 Encounters:  08/30/18 (!) 160/98  08/21/18 (!) 150/90  07/20/18 (!) 162/74     Wt Readings from Last 3 Encounters:  08/30/18 170 lb (77.1 kg)  08/21/18 177 lb (80.3 kg)  07/20/18 176 lb 6.4 oz (80 kg)     Physical Exam Pt is alert and oriented PERRL EOMI HEART IS RRR no murmur or rub LCTA no wheezing or rales MUSCULOSKELETAL reveals a mildly antalgic gait but she is ambulating well.  Labs  Lab Results  Component Value Date   HGBA1C 6.7 (H) 07/20/2018   HGBA1C 5.9 (H) 04/11/2017   Lab Results  Component Value Date   LDLCALC 129 (H) 07/20/2018   CREATININE 0.93 07/20/2018     -------------------------------------------------------------------------------------------------------------------- Lab Results  Component Value Date   WBC 8.1 07/20/2018   HGB 13.6 07/20/2018   HCT 40.7 07/20/2018   PLT 241 07/20/2018   GLUCOSE 140 (H) 07/20/2018   CHOL 194 07/20/2018   TRIG 128 07/20/2018   HDL 41 (L) 07/20/2018   LDLCALC 129 (H) 07/20/2018   ALT 22 07/20/2018   AST 19 07/20/2018   NA 140 07/20/2018   K 3.6 07/20/2018   CL 103 07/20/2018   CREATININE 0.93 07/20/2018   BUN 13 07/20/2018   CO2 28 07/20/2018   TSH 0.03 (L) 07/20/2018   HGBA1C 6.7 (H) 07/20/2018    --------------------------------------------------------------------------------------------------------------------- No results found.   Assessment & Plan:   Durene was seen today for hip pain.  Diagnoses and all orders for this visit:  Chronic pain syndrome -     ToxASSURE Select 13 (MW), Urine  Chronic left hip pain -     Ambulatory referral to Rheumatology  Chronic sacroiliac joint pain  Psoriatic arthritis ([place])  Chronic, continuous use of opioids -     ToxASSURE Select 13 (MW), Urine  Chronic pain of both shoulders  Pain in multiple finger joints  Chronic bilateral low back pain without sciatica  Other orders -     Discontinue: Oxycodone HCl 10 MG TABS; Take 1 tablet (10 mg total) by mouth 4 (four) times daily. -     Discontinue: Oxycodone HCl 10 MG TABS; Take 1 tablet (10 mg total) by mouth 4 (four) times daily. -     Oxycodone HCl 10 MG TABS; Take 1 tablet (10 mg total) by mouth 4 (four) times daily.        ----------------------------------------------------------------------------------------------------------------------  Problem List Items Addressed This Visit      Unprioritized   Chronic hip pain (Chronic)   Relevant Medications   Oxycodone HCl 10 MG TABS (Start on 09/30/2018)   Other Relevant Orders   Ambulatory referral to Rheumatology   Chronic low  back pain (Chronic)   Relevant Medications   Oxycodone HCl 10 MG TABS (Start on 09/30/2018)   Chronic pain syndrome - Primary (Chronic)   Relevant Orders   ToxASSURE Select 13 (MW), Urine   Chronic sacroiliac joint pain (Chronic)   Relevant Medications   Oxycodone HCl 10 MG TABS (Start on 09/30/2018)   Chronic shoulder pain   Relevant Medications   Oxycodone HCl 10 MG TABS (Start on 09/30/2018)   Pain in multiple finger joints    Other Visit Diagnoses    Psoriatic arthritis ([place])       Relevant Medications   Oxycodone HCl 10 MG TABS (Start on 09/30/2018)  Chronic, continuous use of opioids       Relevant Orders   ToxASSURE Select 13 (MW), Urine        ----------------------------------------------------------------------------------------------------------------------  1. Chronic pain syndrome I think she is doing well with her current medication regimen.  I think it is appropriate to continue this.  We have reviewed the [facility] practitioner database information and it is appropriate as well.  She is 2 days short on her medication for the oxycodone and I will allow an early refill with prescription dated December 27 and September 30, 2018. - ToxASSURE Select 13 (MW), Urine  2. Chronic left hip pain I am referring her back to Dr. Jefm Bryant for evaluation of her left hip pain.  She may also be seen by orthopedics if necessary.  I do not think that it is appropriate to increase her opioid dosing secondary to this pain at this time. - Ambulatory referral to Rheumatology  3. Chronic sacroiliac joint pain As above  4. Psoriatic arthritis ([place]) As above  5. Chronic, continuous use of opioids She is due for a UDS for routine eval. - ToxASSURE Select 13 (MW), Urine  6. Chronic pain of both shoulders   7. Pain in multiple finger joints   8. Chronic bilateral low back pain without  sciatica     ----------------------------------------------------------------------------------------------------------------------  I am having Naturi Dagher maintain her vitamin C, Cholecalciferol, betamethasone dipropionate, fluocinonide, Ixekizumab, fluticasone, ipratropium, clobetasol, naloxone, venlafaxine XR, carvedilol, omeprazole, levothyroxine, metFORMIN, lisinopril, and Oxycodone HCl.   Meds ordered this encounter  Medications  . DISCONTD: Oxycodone HCl 10 MG TABS    Sig: Take 1 tablet (10 mg total) by mouth 4 (four) times daily.    Dispense:  120 tablet    Refill:  0    Do not fill until [id number]... 30 day supply  . DISCONTD: Oxycodone HCl 10 MG TABS    Sig: Take 1 tablet (10 mg total) by mouth 4 (four) times daily.    Dispense:  120 tablet    Refill:  0    Do not fill until [id number]... 30 day supply  . Oxycodone HCl 10 MG TABS    Sig: Take 1 tablet (10 mg total) by mouth 4 (four) times daily.    Dispense:  120 tablet    Refill:  0    Do not fill until [id number]... 30 day supply   Patient's Medications  New Prescriptions   No medications on file  Previous Medications   ASCORBIC ACID (VITAMIN C) 1000 MG TABLET    Take 1,000 mg by mouth daily.   BETAMETHASONE DIPROPIONATE (DIPROLENE) 0.05 % OINTMENT    daily.    CARVEDILOL (COREG) 25 MG TABLET    Take 1 tablet (25 mg total) by mouth 2 (two) times daily with a meal.   CHOLECALCIFEROL (VITAMIN D-1000 MAX ST) 1000 UNITS TABLET    Take by mouth daily.    CLOBETASOL (TEMOVATE) 0.05 % EXTERNAL SOLUTION    Apply 1 application topically as needed.    FLUOCINONIDE (LIDEX) 0.05 % EXTERNAL SOLUTION    Apply 1 application topically daily.    FLUTICASONE (FLONASE) 50 MCG/ACT NASAL SPRAY    Place 2 sprays into both nostrils daily.   IPRATROPIUM (ATROVENT) 0.06 % NASAL SPRAY    Place 2 sprays into both nostrils 4 (four) times daily. For up to 5-7 days then stop.   IXEKIZUMAB (TALTZ) 80 MG/ML SOSY    Inject 80 mg into the skin as  directed.  LEVOTHYROXINE (SYNTHROID, LEVOTHROID) 100 MCG TABLET    Take 1 tablet (100 mcg total) by mouth daily.   LISINOPRIL (PRINIVIL,ZESTRIL) 20 MG TABLET    Take 1 tablet (20 mg total) by mouth daily.   METFORMIN (GLUCOPHAGE) 500 MG TABLET    Take 1 tablet (500 mg total) by mouth 2 (two) times daily with a meal.   NALOXONE (NARCAN) NASAL SPRAY 4 MG/0.1 ML    To reverse excess sedation from narcotics   OMEPRAZOLE (PRILOSEC) 20 MG CAPSULE    Take 1 capsule (20 mg total) by mouth daily.   VENLAFAXINE XR (EFFEXOR-XR) 75 MG 24 HR CAPSULE    TAKE 1 CAPSULE BY MOUTH ONCE DAILY WITH  BREAKFAST  Modified Medications   Modified Medication Previous Medication   OXYCODONE HCL 10 MG TABS Oxycodone HCl 10 MG TABS      Take 1 tablet (10 mg total) by mouth 4 (four) times daily.    Take 1 tablet (10 mg total) by mouth 4 (four) times daily.  Discontinued Medications   No medications on file   ----------------------------------------------------------------------------------------------------------------------  Follow-up: No follow-ups on file.    Molli Barrows, MD

## 2018-09-24 ENCOUNTER — Other Ambulatory Visit: Payer: Self-pay | Admitting: Nurse Practitioner

## 2018-09-24 DIAGNOSIS — I5022 Chronic systolic (congestive) heart failure: Secondary | ICD-10-CM

## 2018-10-23 ENCOUNTER — Encounter: Payer: Self-pay | Admitting: Nurse Practitioner

## 2018-10-23 ENCOUNTER — Ambulatory Visit (INDEPENDENT_AMBULATORY_CARE_PROVIDER_SITE_OTHER): Payer: Commercial Managed Care - PPO | Admitting: Nurse Practitioner

## 2018-10-23 ENCOUNTER — Telehealth: Payer: Self-pay | Admitting: Nurse Practitioner

## 2018-10-23 ENCOUNTER — Other Ambulatory Visit: Payer: Self-pay

## 2018-10-23 VITALS — BP 121/65 | HR 84 | Temp 98.3°F | Ht 70.0 in | Wt 172.8 lb

## 2018-10-23 DIAGNOSIS — G43711 Chronic migraine without aura, intractable, with status migrainosus: Secondary | ICD-10-CM

## 2018-10-23 MED ORDER — ONDANSETRON HCL 4 MG PO TABS: 4.0000 mg | ORAL_TABLET | Freq: Three times a day (TID) | ORAL | 0 refills | Status: DC | PRN

## 2018-10-23 MED ORDER — PROPRANOLOL HCL ER 60 MG PO CP24: 60.0000 mg | ORAL_CAPSULE | Freq: Every day | ORAL | 5 refills | Status: DC

## 2018-10-23 MED ORDER — SUMATRIPTAN 5 MG/ACT NA SOLN: 1.0000 | NASAL | 0 refills | Status: DC | PRN

## 2018-10-23 NOTE — Progress Notes (Signed)
migr

## 2018-10-23 NOTE — Patient Instructions (Addendum)
Krista Kennedy,   Thank you for coming in to clinic today.  1. START sumatriptan 1 spray into nose (one nostril) every 2 hours x 3 doses to help stop a migraine.  2. Ondansetron 4 mg one tablet every 8 hours prn for nausea with migraine.  3. START propranololER 60 mg tablet once daily for migraine prevention.  4. Continue headache diary (log of headache days and intensity).  This will help [place] qualify you for injectable migraine medication therapies in future to prevent migraines (Emgality, Aimovig, Ajovy).  Please schedule a follow-up appointment with [name], AGNP. Return in about 6 weeks (around 12/04/2018) for migraine for propranolol.  If you have any other questions or concerns, please feel free to call the clinic or send a message through [place]. You may also schedule an earlier appointment if necessary.  You will receive a survey after today's visit either digitally by e-mail or paper by [employer]. Your experiences and feedback matter to [place].  Please respond so we know how we are doing as we provide care for you.   [name], DNP, AGNP-BC Adult Gerontology Nurse Practitioner [place]

## 2018-10-23 NOTE — Telephone Encounter (Signed)
Incoming call

## 2018-10-23 NOTE — Telephone Encounter (Signed)
Pt said the muscle relaxer was baclofen.  Her call back number [phone]

## 2018-10-23 NOTE — Progress Notes (Signed)
Subjective:    Patient ID: Krista Kennedy, female    DOB: 08/27/1966, 53 y.o.   MRN: [id number]  Krista Kennedy is a 53 y.o. female presenting on 10/23/2018 for Migraine (intermittent x 35mths vision disturbaance with a floater in the Rt eye, vomiting, sensitivity to light, sensitivity to sound  and jittery.)   HPI Migraine headache Patient has had migraines for years, started in teens.  Has had cyclical pattern off and on.  Last for 3-7 days at a time.  Intensity does reduce over time.    Patient has tried Topamax without any relief,  - Imitrex - helps a little - Fioricet- no recall about this med for abortive therapy - Ketorolac in hospital in past provided only partial relief for short duration. - Patient states last ED visit "a few months ago," but no records are available.  Requesting Percocet - oxycodone not effective per patient. - Patient has not used botox injections ever in past, no recent neurology but has used in past. - Patient has 2 headache per month currently, 10-14 headache days.  Social History   Tobacco Use  . Smoking status: Former Smoker    Packs/day: 0.25    Years: 15.00    Pack years: 3.75    Types: Cigarettes  . Smokeless tobacco: Former Network engineer Use Topics  . Alcohol use: No  . Drug use: No    Review of Systems Per HPI unless specifically indicated above     Objective:    BP 121/65 (BP Location: Right Arm, Patient Position: Sitting, Cuff Size: Normal)   Pulse 84   Temp 98.3 F (36.8 C) (Oral)   Ht 5\' 10"  (1.778 m)   Wt 172 lb 12.8 oz (78.4 kg)   BMI 24.79 kg/m   Wt Readings from Last 3 Encounters:  10/23/18 172 lb 12.8 oz (78.4 kg)  08/30/18 170 lb (77.1 kg)  08/21/18 177 lb (80.3 kg)    Physical Exam Vitals signs reviewed.  Constitutional:      General: She is not in acute distress.    Appearance: She is well-developed.  HENT:     Head: Normocephalic and atraumatic.  Cardiovascular:     Rate and Rhythm: Normal rate and  regular rhythm.     Pulses:          Radial pulses are 2+ on the right side and 2+ on the left side.       Posterior tibial pulses are 1+ on the right side and 1+ on the left side.     Heart sounds: Normal heart sounds, S1 normal and S2 normal.  Pulmonary:     Effort: Pulmonary effort is normal. No respiratory distress.     Breath sounds: Normal breath sounds and air entry.  Musculoskeletal:     Right lower leg: No edema.     Left lower leg: No edema.  Skin:    General: Skin is warm and dry.     Capillary Refill: Capillary refill takes less than 2 seconds.  Neurological:     General: No focal deficit present.     Mental Status: She is alert and oriented to person, place, and time. Mental status is at baseline.     Cranial Nerves: No cranial nerve deficit.     Sensory: No sensory deficit.     Motor: No weakness.     Coordination: Coordination normal.     Gait: Gait normal.     Deep Tendon Reflexes: Reflexes are normal  and symmetric. Reflexes normal.  Psychiatric:        Attention and Perception: Attention normal.        Mood and Affect: Mood and affect normal.        Behavior: Behavior normal. Behavior is cooperative.        Thought Content: Thought content normal.        Judgment: Judgment normal.    Results for orders placed or performed in visit on 07/20/18  COMPLETE METABOLIC PANEL WITH GFR  Result Value Ref Range   Glucose, Bld 140 (H) 65 - 99 mg/dL   BUN 13 7 - 25 mg/dL   Creat 0.93 0.50 - 1.05 mg/dL   GFR, Est Non African American 71 > OR = 60 mL/min/1.13m2   GFR, Est African American 82 > OR = 60 mL/min/1.20m2   BUN/Creatinine Ratio NOT APPLICABLE 6 - 22 (calc)   Sodium 140 135 - 146 mmol/L   Potassium 3.6 3.5 - 5.3 mmol/L   Chloride 103 98 - 110 mmol/L   CO2 28 20 - 32 mmol/L   Calcium 8.7 8.6 - 10.4 mg/dL   Total Protein 7.2 6.1 - 8.1 g/dL   Albumin 4.0 3.6 - 5.1 g/dL   Globulin 3.2 1.9 - 3.7 g/dL (calc)   AG Ratio 1.3 1.0 - 2.5 (calc)   Total Bilirubin 0.6  0.2 - 1.2 mg/dL   Alkaline phosphatase (APISO) 100 33 - 130 U/L   AST 19 10 - 35 U/L   ALT 22 6 - 29 U/L  CBC with Differential/Platelet  Result Value Ref Range   WBC 8.1 3.8 - 10.8 Thousand/uL   RBC 4.74 3.80 - 5.10 Million/uL   Hemoglobin 13.6 11.7 - 15.5 g/dL   HCT 40.7 35.0 - 45.0 %   MCV 85.9 80.0 - 100.0 fL   MCH 28.7 27.0 - 33.0 pg   MCHC 33.4 32.0 - 36.0 g/dL   RDW 12.5 11.0 - 15.0 %   Platelets 241 140 - 400 Thousand/uL   MPV 9.9 7.5 - 12.5 fL   Neutro Abs 5,160 1,500 - 7,800 cells/uL   Lymphs Abs 2,074 850 - 3,900 cells/uL   WBC mixed population 672 200 - 950 cells/uL   Eosinophils Absolute 138 15 - 500 cells/uL   Basophils Absolute 57 0 - 200 cells/uL   Neutrophils Relative % 63.7 %   Total Lymphocyte 25.6 %   Monocytes Relative 8.3 %   Eosinophils Relative 1.7 %   Basophils Relative 0.7 %  Hemoglobin A1c  Result Value Ref Range   Hgb A1c MFr Bld 6.7 (H) <5.7 % of total Hgb   Mean Plasma Glucose 146 (calc)   eAG (mmol/L) 8.1 (calc)  Lipid panel  Result Value Ref Range   Cholesterol 194 <200 mg/dL   HDL 41 (L) >50 mg/dL   Triglycerides 128 <150 mg/dL   LDL Cholesterol (Calc) 129 (H) mg/dL (calc)   Total CHOL/HDL Ratio 4.7 <5.0 (calc)   Non-HDL Cholesterol (Calc) 153 (H) <130 mg/dL (calc)  TSH  Result Value Ref Range   TSH 0.03 (L) mIU/L  Cytology - PAP  Result Value Ref Range   Adequacy (A)     Satisfactory for evaluation  endocervical/transformation zone component PRESENT.   Diagnosis (A)     ATYPICAL SQUAMOUS CELLS OF UNDETERMINED SIGNIFICANCE (ASC-US).   HPV 16/18/45 genotyping NEGATIVE for HPV 16 & 18/45    HPV DETECTED (A)    Material Submitted CervicoVaginal Pap [ThinPrep Imaged] (A)  CYTOLOGY - PAP PAP RESULT       Assessment & Plan:   Problem List Items Addressed This Visit    None    Visit Diagnoses    Intractable chronic migraine without aura and with status migrainosus    -  Primary   Relevant Medications   ondansetron (ZOFRAN) 4 MG  tablet   SUMAtriptan (IMITREX) 5 MG/ACT nasal spray   propranolol ER (INDERAL LA) 60 MG 24 hr capsule    Consistent with persistent migraine HA x 4 days, intractable unknown trigger, but patient notes higher stress - Currently with active HA, well-appearing, no focal neuro deficits, tolerating PO w/o n/v  Plan: 1. Restart abortive therapy with Sumatriptan 5 mg  Nasal spray.  Take 1 PRN (#12, 0 refill due to quantity limit), severe HA, may repeat dose within 2 hr if persistent, no more in 24 hours, in future can titrate dose to 50-100mg  if needed. Counseling on potential side effect / intolerance with chest discomfort acutely after taking sumatriptan 2. Recommend taking Ibuprofen 600-800mg  q 8 hr PRN, and can try Tylenol 1000mg  TID alternatively - patient has had some relief with muscle relaxer in past, but does not know name. 3. Avoid triggers including foods, caffeine. Important to rest. - Discussion on future migraine prophylaxis medications - handout given, review options at next visit, determine need if using sumatriptan frequently 4. Start headache diary, handout given, identify triggers for avoidance, bring to next visit 5. Return criteria given for acute migraine, when to go to office vs ED  Meds ordered this encounter  Medications  . ondansetron (ZOFRAN) 4 MG tablet    Sig: Take 1 tablet (4 mg total) by mouth every 8 (eight) hours as needed for nausea or vomiting.    Dispense:  20 tablet    Refill:  0    Order Specific Question:   Supervising Provider    Answer:   Olin Hauser [[id number]]  . SUMAtriptan (IMITREX) 5 MG/ACT nasal spray    Sig: Place 1 spray (5 mg total) into the nose every 2 (two) hours as needed for migraine.    Dispense:  1 Inhaler    Refill:  0    Order Specific Question:   Supervising Provider    Answer:   Olin Hauser [[id number]]  . propranolol ER (INDERAL LA) 60 MG 24 hr capsule    Sig: Take 1 capsule (60 mg total) by mouth daily.     Dispense:  30 capsule    Refill:  5    Order Specific Question:   Supervising Provider    Answer:   Olin Hauser [[id number]]    Follow up plan: Return in about 6 weeks (around 12/04/2018) for migraine for propranolol.  [name], DNP, AGPCNP-BC Adult Gerontology Primary Care Nurse Practitioner [facility] 10/23/2018, 10:38 AM

## 2018-10-24 MED ORDER — BACLOFEN 10 MG PO TABS: 10.0000 mg | ORAL_TABLET | Freq: Two times a day (BID) | ORAL | 0 refills | Status: DC | PRN

## 2018-10-26 ENCOUNTER — Encounter: Payer: Self-pay | Admitting: Nurse Practitioner

## 2018-11-01 ENCOUNTER — Other Ambulatory Visit: Payer: Self-pay

## 2018-11-01 ENCOUNTER — Encounter: Payer: Self-pay | Admitting: Nurse Practitioner

## 2018-11-01 ENCOUNTER — Ambulatory Visit (INDEPENDENT_AMBULATORY_CARE_PROVIDER_SITE_OTHER): Payer: Commercial Managed Care - PPO | Admitting: Nurse Practitioner

## 2018-11-01 ENCOUNTER — Ambulatory Visit: Payer: Commercial Managed Care - PPO | Attending: Anesthesiology | Admitting: Anesthesiology

## 2018-11-01 ENCOUNTER — Encounter: Payer: Self-pay | Admitting: Anesthesiology

## 2018-11-01 VITALS — BP 170/94 | HR 96 | Temp 98.4°F | Resp 18 | Ht 70.0 in | Wt 172.0 lb

## 2018-11-01 VITALS — BP 161/66 | HR 91 | Temp 98.5°F | Resp 16 | Ht 70.0 in | Wt 171.8 lb

## 2018-11-01 DIAGNOSIS — M25549 Pain in joints of unspecified hand: Secondary | ICD-10-CM | POA: Insufficient documentation

## 2018-11-01 DIAGNOSIS — M25552 Pain in left hip: Secondary | ICD-10-CM | POA: Insufficient documentation

## 2018-11-01 DIAGNOSIS — R300 Dysuria: Secondary | ICD-10-CM | POA: Diagnosis not present

## 2018-11-01 DIAGNOSIS — N3001 Acute cystitis with hematuria: Secondary | ICD-10-CM | POA: Diagnosis not present

## 2018-11-01 DIAGNOSIS — L4050 Arthropathic psoriasis, unspecified: Secondary | ICD-10-CM | POA: Diagnosis present

## 2018-11-01 DIAGNOSIS — M25532 Pain in left wrist: Secondary | ICD-10-CM | POA: Insufficient documentation

## 2018-11-01 DIAGNOSIS — G894 Chronic pain syndrome: Secondary | ICD-10-CM | POA: Diagnosis present

## 2018-11-01 DIAGNOSIS — G8929 Other chronic pain: Secondary | ICD-10-CM | POA: Insufficient documentation

## 2018-11-01 DIAGNOSIS — M545 Low back pain: Secondary | ICD-10-CM | POA: Insufficient documentation

## 2018-11-01 DIAGNOSIS — M25511 Pain in right shoulder: Secondary | ICD-10-CM | POA: Diagnosis present

## 2018-11-01 DIAGNOSIS — F1190 Opioid use, unspecified, uncomplicated: Secondary | ICD-10-CM | POA: Insufficient documentation

## 2018-11-01 DIAGNOSIS — M533 Sacrococcygeal disorders, not elsewhere classified: Secondary | ICD-10-CM | POA: Diagnosis present

## 2018-11-01 DIAGNOSIS — M25512 Pain in left shoulder: Secondary | ICD-10-CM

## 2018-11-01 LAB — POCT URINALYSIS DIPSTICK
Bilirubin, UA: NEGATIVE
Glucose, UA: NEGATIVE
Ketones, UA: NEGATIVE
Nitrite, UA: NEGATIVE
Protein, UA: POSITIVE — AB
Spec Grav, UA: 1.015 (ref 1.010–1.025)
Urobilinogen, UA: 0.2 E.U./dL
pH, UA: 7 (ref 5.0–8.0)

## 2018-11-01 MED ORDER — CEPHALEXIN 500 MG PO CAPS: 500.0000 mg | ORAL_CAPSULE | Freq: Three times a day (TID) | ORAL | 0 refills | Status: AC

## 2018-11-01 MED ORDER — OXYCODONE-ACETAMINOPHEN 10-325 MG PO TABS: 1.0000 | ORAL_TABLET | Freq: Four times a day (QID) | ORAL | 0 refills | Status: AC | PRN

## 2018-11-01 MED ORDER — OXYCODONE-ACETAMINOPHEN 10-325 MG PO TABS: 1.0000 | ORAL_TABLET | Freq: Four times a day (QID) | ORAL | 0 refills | Status: DC | PRN

## 2018-11-01 NOTE — Patient Instructions (Addendum)
Krista Kennedy,   Thank you for coming in to clinic today.  1. UTI today - START keflex 500 mg one tablet three times daily for 5 days.  Please schedule a follow-up appointment with [name], AGNP. Return if symptoms worsen or fail to improve.  If you have any other questions or concerns, please feel free to call the clinic or send a message through [place]. You may also schedule an earlier appointment if necessary.  You will receive a survey after today's visit either digitally by e-mail or paper by [employer]. Your experiences and feedback matter to [place].  Please respond so we know how we are doing as we provide care for you.   [name], DNP, AGNP-BC Adult Gerontology Nurse Practitioner [place]

## 2018-11-01 NOTE — Progress Notes (Signed)
Subjective:    Patient ID: Krista Kennedy, female    DOB: [birth year], 53 y.o.   MRN: [id number]  Krista Kennedy is a 53 y.o. female presenting on 11/01/2018 for Urinary Tract Infection (frequent urination, with pain and back pain)   HPI UTI symptoms Patient presents today with about 7 day duration of symptoms that include backache, Pelvic pressure, Dysuria, Frequency.  She notes no fevers,chills, sweats. - Patient was medicating herself for pain control and notes Headache was so overwhelming she couldn't pay attention to the urinary symptoms.   Headache follow-up: Is having some blurry vision, but no vision loss.  History of glaucoma. Appt with optometry on Monday.   Social History   Tobacco Use  . Smoking status: Former Smoker    Packs/day: 0.25    Years: 15.00    Pack years: 3.75    Types: Cigarettes  . Smokeless tobacco: Former Network engineer Use Topics  . Alcohol use: No  . Drug use: No    Review of Systems Per HPI unless specifically indicated above     Objective:    BP (!) 161/66   Pulse 91   Temp 98.5 F (36.9 C) (Oral)   Resp 16   Ht 5\' 10"  (1.778 m)   Wt 171 lb 12.8 oz (77.9 kg)   SpO2 100%   BMI 24.65 kg/m   Wt Readings from Last 3 Encounters:  11/01/18 171 lb 12.8 oz (77.9 kg)  11/01/18 172 lb (78 kg)  10/23/18 172 lb 12.8 oz (78.4 kg)    Physical Exam Vitals signs reviewed.  Constitutional:      General: She is not in acute distress.    Appearance: She is well-developed.  HENT:     Head: Normocephalic and atraumatic.  Cardiovascular:     Rate and Rhythm: Normal rate and regular rhythm.     Pulses:          Radial pulses are 2+ on the right side and 2+ on the left side.       Posterior tibial pulses are 1+ on the right side and 1+ on the left side.     Heart sounds: Normal heart sounds, S1 normal and S2 normal.  Pulmonary:     Effort: Pulmonary effort is normal. No respiratory distress.     Breath sounds: Normal breath sounds and air entry.   Abdominal:     General: Bowel sounds are normal. There is no distension.     Palpations: Abdomen is soft.     Tenderness: There is abdominal tenderness in the suprapubic area. There is no right CVA tenderness or left CVA tenderness.     Hernia: No hernia is present.  Musculoskeletal:     Right lower leg: No edema.     Left lower leg: No edema.  Skin:    General: Skin is warm and dry.     Capillary Refill: Capillary refill takes less than 2 seconds.  Neurological:     Mental Status: She is alert and oriented to person, place, and time.  Psychiatric:        Attention and Perception: Attention normal.        Mood and Affect: Mood and affect normal.        Behavior: Behavior normal. Behavior is cooperative.    Results for orders placed or performed in visit on 11/01/18  POCT Urinalysis Dipstick  Result Value Ref Range   Color, UA yellow    Clarity, UA clear  Glucose, UA Negative Negative   Bilirubin, UA negative    Ketones, UA negative    Spec Grav, UA 1.015 1.010 - 1.025   Blood, UA small    pH, UA 7.0 5.0 - 8.0   Protein, UA Positive (A) Negative   Urobilinogen, UA 0.2 0.2 or 1.0 E.U./dL   Nitrite, UA negative    Leukocytes, UA Moderate (2+) (A) Negative   Appearance     Odor        Assessment & Plan:   Problem List Items Addressed This Visit    None    Visit Diagnoses    Acute cystitis with hematuria    -  Primary   Relevant Medications   cephALEXin (KEFLEX) 500 MG capsule   Dysuria       Relevant Medications   cephALEXin (KEFLEX) 500 MG capsule   Other Relevant Orders   POCT Urinalysis Dipstick (Completed)     Acute cystitis with hematuria.  Pt symptomatic currently with increased suprapubic pressure x 7 days. Currently without systemic signs or symptoms of infection.   - No current risk of concurrent STI.  Plan: 1. START Keflex 500mg  3 times daily for next 5 days.   2. Provided non-pharm measures for UTI prevention for good hygiene. 3. Drink plenty of  fluids and improve hydration over next 1 week. 4. Provided precautions for severe symptoms requiring ED visit to include no urine in 24-48 hours. 5. Followup 2-5 days as needed for worsening or persistent symptoms.    Meds ordered this encounter  Medications  . cephALEXin (KEFLEX) 500 MG capsule    Sig: Take 1 capsule (500 mg total) by mouth 3 (three) times daily for 5 days.    Dispense:  15 capsule    Refill:  0    Order Specific Question:   Supervising Provider    Answer:   Olin Hauser [[id number]]    Follow up plan: Return if symptoms worsen or fail to improve.  [name], DNP, AGPCNP-BC Adult Gerontology Primary Care Nurse Practitioner [facility] 11/01/2018, 2:34 PM

## 2018-11-01 NOTE — Progress Notes (Signed)
Nursing Pain Medication Assessment:  Safety precautions to be maintained throughout the outpatient stay will include: orient to surroundings, keep bed in low position, maintain call bell within reach at all times, provide assistance with transfer out of bed and ambulation.  Medication Inspection Compliance: Ms. Parkhurst did not comply with our request to bring her pills to be counted. She was reminded that bringing the medication bottles, even when empty, is a requirement.  Medication: None brought in. Pill/Patch Count: None available to be counted. Bottle Appearance: No container available. Did not bring bottle(s) to appointment. Filled Date: N/A Last Medication intake:  Today

## 2018-11-01 NOTE — Patient Instructions (Addendum)
You were given 2 prescriptions for Oxycodone today. Please return next week for pill count and urine drug screen

## 2018-11-02 NOTE — Progress Notes (Signed)
Subjective:  Patient ID: Krista Kennedy, female    DOB: [birth year]  Age: 53 y.o. MRN: [id number]  CC: Hip Pain (left)   Procedure: None  HPI Krista Kennedy presents for reevaluation.  She was last seen 2 months ago and continues to do well with her current regimen.  She is taking her medications as prescribed and these seem to keep her pain under decent control.  Unfortunately she has failed conservative therapy and this regimen seems to be working favorably.  I have reviewed her narcotic assessment sheet and based on this she is continuing to derive good functional lifestyle improvement with no side effects mentioned.  The style quality characteristic and distribution of pain are stable with no recent changes noted.  Outpatient Medications Prior to Visit  Medication Sig Dispense Refill  . Ascorbic Acid (VITAMIN C) 1000 MG tablet Take 1,000 mg by mouth daily.    . baclofen (LIORESAL) 10 MG tablet Take 1 tablet (10 mg total) by mouth 2 (two) times daily as needed (muscle tension 2/2 migraine). 30 each 0  . betamethasone dipropionate (DIPROLENE) 0.05 % ointment daily.   0  . carvedilol (COREG) 25 MG tablet Take 1 tablet (25 mg total) by mouth 2 (two) times daily with a meal. 180 tablet 1  . Cholecalciferol (VITAMIN D-1000 MAX ST) 1000 UNITS tablet Take by mouth daily.     . clobetasol (TEMOVATE) 0.05 % external solution Apply 1 application topically as needed.     . fluocinonide (LIDEX) 0.05 % external solution Apply 1 application topically daily.   0  . fluticasone (FLONASE) 50 MCG/ACT nasal spray Place 2 sprays into both nostrils daily. 16 g 3  . Ixekizumab (TALTZ) 80 MG/ML SOSY Inject 80 mg into the skin as directed.    Marland Kitchen levothyroxine (SYNTHROID, LEVOTHROID) 100 MCG tablet Take 1 tablet (100 mcg total) by mouth daily. 30 tablet 2  . lisinopril (PRINIVIL,ZESTRIL) 20 MG tablet Take 1 tablet (20 mg total) by mouth daily. 90 tablet 3  . metFORMIN (GLUCOPHAGE) 500 MG tablet Take 1 tablet (500  mg total) by mouth 2 (two) times daily with a meal. 180 tablet 3  . naloxone (NARCAN) nasal spray 4 mg/0.1 mL To reverse excess sedation from narcotics 2 kit 1  . SUMAtriptan (IMITREX) 5 MG/ACT nasal spray Place 1 spray (5 mg total) into the nose every 2 (two) hours as needed for migraine. 1 Inhaler 0  . venlafaxine XR (EFFEXOR-XR) 75 MG 24 hr capsule TAKE 1 CAPSULE BY MOUTH ONCE DAILY WITH  BREAKFAST 30 capsule 0  . ipratropium (ATROVENT) 0.06 % nasal spray Place 2 sprays into both nostrils 4 (four) times daily. For up to 5-7 days then stop. (Patient not taking: Reported on 10/23/2018) 15 mL 0  . omeprazole (PRILOSEC) 20 MG capsule Take 1 capsule (20 mg total) by mouth daily. 30 capsule 2  . ondansetron (ZOFRAN) 4 MG tablet Take 1 tablet (4 mg total) by mouth every 8 (eight) hours as needed for nausea or vomiting. 20 tablet 0  . propranolol ER (INDERAL LA) 60 MG 24 hr capsule Take 1 capsule (60 mg total) by mouth daily. (Patient not taking: Reported on 11/01/2018) 30 capsule 5   No facility-administered medications prior to visit.     Review of Systems CNS: No confusion or sedation Cardiac: No angina or palpitations GI: No abdominal pain or constipation Constitutional: No nausea vomiting fevers or chills  Objective:  BP (!) 170/94   Pulse 96   Temp  98.4 F (36.9 C) (Oral)   Resp 18   Ht _0  (1.778 m)   Wt 172 lb (78 kg)   SpO2 100%   BMI 24.68 kg/m    BP Readings from Last 3 Encounters:  11/01/18 (!) 161/66  11/01/18 (!) 170/94  10/23/18 121/65     Wt Readings from Last 3 Encounters:  11/01/18 171 lb 12.8 oz (77.9 kg)  11/01/18 172 lb (78 kg)  10/23/18 172 lb 12.8 oz (78.4 kg)     Physical Exam Pt is alert and oriented PERRL EOMI HEART IS RRR no murmur or rub LCTA no wheezing or rales MUSCULOSKELETAL reveals some paraspinous muscle tenderness in the low back.  She ambulates with a mildly antalgic gait but her lower extremity muscle tone and bulk appears to be well  preserved.  Labs  Lab Results  Component Value Date   HGBA1C 6.7 (H) 07/20/2018   HGBA1C 5.9 (H) 04/11/2017   Lab Results  Component Value Date   LDLCALC 129 (H) 07/20/2018   CREATININE 0.93 07/20/2018    -------------------------------------------------------------------------------------------------------------------- Lab Results  Component Value Date   WBC 8.1 07/20/2018   HGB 13.6 07/20/2018   HCT 40.7 07/20/2018   PLT 241 07/20/2018   GLUCOSE 140 (H) 07/20/2018   CHOL 194 07/20/2018   TRIG 128 07/20/2018   HDL 41 (L) 07/20/2018   LDLCALC 129 (H) 07/20/2018   ALT 22 07/20/2018   AST 19 07/20/2018   NA 140 07/20/2018   K 3.6 07/20/2018   CL 103 07/20/2018   CREATININE 0.93 07/20/2018   BUN 13 07/20/2018   CO2 28 07/20/2018   TSH 0.03 (L) 07/20/2018   HGBA1C 6.7 (H) 07/20/2018    --------------------------------------------------------------------------------------------------------------------- No results found.   Assessment & Plan:   Annlouise was seen today for hip pain.  Diagnoses and all orders for this visit:  Chronic pain syndrome -     ToxASSURE Select 13 (MW), Urine  Chronic left hip pain  Chronic sacroiliac joint pain  Psoriatic arthritis ([place])  Chronic, continuous use of opioids -     ToxASSURE Select 13 (MW), Urine  Chronic pain of both shoulders  Pain in multiple finger joints  Chronic bilateral low back pain without sciatica  Left wrist pain  Psoriasis with arthropathy ([place])  Other orders -     oxyCODONE-acetaminophen (PERCOCET) 10-325 MG tablet; Take 1 tablet by mouth every 6 (six) hours as needed for up to 30 days for pain. -     oxyCODONE-acetaminophen (PERCOCET) 10-325 MG tablet; Take 1 tablet by mouth every 6 (six) hours as needed for up to 30 days for pain.        ----------------------------------------------------------------------------------------------------------------------  Problem List Items Addressed This  Visit      Unprioritized   Chronic hip pain (Chronic)   Relevant Medications   oxyCODONE-acetaminophen (PERCOCET) 10-325 MG tablet   oxyCODONE-acetaminophen (PERCOCET) 10-325 MG tablet (Start on 12/01/2018)   Chronic low back pain (Chronic)   Relevant Medications   oxyCODONE-acetaminophen (PERCOCET) 10-325 MG tablet   oxyCODONE-acetaminophen (PERCOCET) 10-325 MG tablet (Start on 12/01/2018)   Chronic pain syndrome - Primary (Chronic)   Relevant Orders   ToxASSURE Select 13 (MW), Urine   Chronic sacroiliac joint pain (Chronic)   Relevant Medications   oxyCODONE-acetaminophen (PERCOCET) 10-325 MG tablet   oxyCODONE-acetaminophen (PERCOCET) 10-325 MG tablet (Start on 12/01/2018)   Chronic shoulder pain   Relevant Medications   oxyCODONE-acetaminophen (PERCOCET) 10-325 MG tablet   oxyCODONE-acetaminophen (PERCOCET) 10-325 MG tablet (Start  on 12/01/2018)   Left wrist pain (Chronic)   Pain in multiple finger joints   Psoriasis with arthropathy ([place])    Other Visit Diagnoses    Psoriatic arthritis ([place])       Relevant Medications   oxyCODONE-acetaminophen (PERCOCET) 10-325 MG tablet   oxyCODONE-acetaminophen (PERCOCET) 10-325 MG tablet (Start on 12/01/2018)   Chronic, continuous use of opioids       Relevant Orders   ToxASSURE Select 13 (MW), Urine        ----------------------------------------------------------------------------------------------------------------------  1. Chronic pain syndrome We will keep her on her current regimen as this reportedly is working well.  We will give her prescriptions for the next 2 months dated February 27 and March 28.  I have reviewed the [facility] practitioner database information and it is appropriate. - ToxASSURE Select 13 (MW), Urine  2. Chronic left hip pain As above and continue follow-up with orthopedics  3. Chronic sacroiliac joint pain As above  4. Psoriatic arthritis ([place]) As above and continue with her primary care  physician and rheumatologist  5. Chronic, continuous use of opioids As above.  She is scheduled for a UDS - ToxASSURE Select 13 (MW), Urine  6. Chronic pain of both shoulders   7. Pain in multiple finger joints   8. Chronic bilateral low back pain without sciatica   9. Left wrist pain   10. Psoriasis with arthropathy ([place])     ----------------------------------------------------------------------------------------------------------------------  I am having Willy Ernsberger start on oxyCODONE-acetaminophen and oxyCODONE-acetaminophen. I am also having her maintain her vitamin C, Cholecalciferol, betamethasone dipropionate, fluocinonide, Ixekizumab, fluticasone, ipratropium, clobetasol, naloxone, venlafaxine XR, carvedilol, omeprazole, levothyroxine, metFORMIN, lisinopril, ondansetron, SUMAtriptan, propranolol ER, and baclofen.   Meds ordered this encounter  Medications  . oxyCODONE-acetaminophen (PERCOCET) 10-325 MG tablet    Sig: Take 1 tablet by mouth every 6 (six) hours as needed for up to 30 days for pain.    Dispense:  120 tablet    Refill:  0    30 day supply  . oxyCODONE-acetaminophen (PERCOCET) 10-325 MG tablet    Sig: Take 1 tablet by mouth every 6 (six) hours as needed for up to 30 days for pain.    Dispense:  120 tablet    Refill:  0    30 day supply   Patient's Medications  New Prescriptions   CEPHALEXIN (KEFLEX) 500 MG CAPSULE    Take 1 capsule (500 mg total) by mouth 3 (three) times daily for 5 days.   OXYCODONE-ACETAMINOPHEN (PERCOCET) 10-325 MG TABLET    Take 1 tablet by mouth every 6 (six) hours as needed for up to 30 days for pain.   OXYCODONE-ACETAMINOPHEN (PERCOCET) 10-325 MG TABLET    Take 1 tablet by mouth every 6 (six) hours as needed for up to 30 days for pain.  Previous Medications   ASCORBIC ACID (VITAMIN C) 1000 MG TABLET    Take 1,000 mg by mouth daily.   BACLOFEN (LIORESAL) 10 MG TABLET    Take 1 tablet (10 mg total) by mouth 2 (two) times  daily as needed (muscle tension 2/2 migraine).   BETAMETHASONE DIPROPIONATE (DIPROLENE) 0.05 % OINTMENT    daily.    CARVEDILOL (COREG) 25 MG TABLET    Take 1 tablet (25 mg total) by mouth 2 (two) times daily with a meal.   CHOLECALCIFEROL (VITAMIN D-1000 MAX ST) 1000 UNITS TABLET    Take by mouth daily.    CLOBETASOL (TEMOVATE) 0.05 % EXTERNAL SOLUTION    Apply 1  application topically as needed.    FLUOCINONIDE (LIDEX) 0.05 % EXTERNAL SOLUTION    Apply 1 application topically daily.    FLUTICASONE (FLONASE) 50 MCG/ACT NASAL SPRAY    Place 2 sprays into both nostrils daily.   IPRATROPIUM (ATROVENT) 0.06 % NASAL SPRAY    Place 2 sprays into both nostrils 4 (four) times daily. For up to 5-7 days then stop.   IXEKIZUMAB (TALTZ) 80 MG/ML SOSY    Inject 80 mg into the skin as directed.   LEVOTHYROXINE (SYNTHROID, LEVOTHROID) 100 MCG TABLET    Take 1 tablet (100 mcg total) by mouth daily.   LISINOPRIL (PRINIVIL,ZESTRIL) 20 MG TABLET    Take 1 tablet (20 mg total) by mouth daily.   METFORMIN (GLUCOPHAGE) 500 MG TABLET    Take 1 tablet (500 mg total) by mouth 2 (two) times daily with a meal.   NALOXONE (NARCAN) NASAL SPRAY 4 MG/0.1 ML    To reverse excess sedation from narcotics   OMEPRAZOLE (PRILOSEC) 20 MG CAPSULE    Take 1 capsule (20 mg total) by mouth daily.   ONDANSETRON (ZOFRAN) 4 MG TABLET    Take 1 tablet (4 mg total) by mouth every 8 (eight) hours as needed for nausea or vomiting.   PROPRANOLOL ER (INDERAL LA) 60 MG 24 HR CAPSULE    Take 1 capsule (60 mg total) by mouth daily.   SUMATRIPTAN (IMITREX) 5 MG/ACT NASAL SPRAY    Place 1 spray (5 mg total) into the nose every 2 (two) hours as needed for migraine.   VENLAFAXINE XR (EFFEXOR-XR) 75 MG 24 HR CAPSULE    TAKE 1 CAPSULE BY MOUTH ONCE DAILY WITH  BREAKFAST  Modified Medications   No medications on file  Discontinued Medications   No medications on file    ----------------------------------------------------------------------------------------------------------------------  Follow-up: Return in about 2 months (around 12/31/2018) for evaluation, med refill.    Molli Barrows, MD

## 2018-11-03 ENCOUNTER — Encounter: Payer: Self-pay | Admitting: Nurse Practitioner

## 2018-11-19 ENCOUNTER — Telehealth: Payer: Self-pay | Admitting: Nurse Practitioner

## 2018-11-19 DIAGNOSIS — F3341 Major depressive disorder, recurrent, in partial remission: Secondary | ICD-10-CM

## 2018-11-19 DIAGNOSIS — F419 Anxiety disorder, unspecified: Secondary | ICD-10-CM

## 2018-11-19 MED ORDER — VENLAFAXINE HCL ER 75 MG PO CP24: ORAL_CAPSULE | ORAL | 0 refills | Status: DC

## 2018-11-19 NOTE — Telephone Encounter (Signed)
Patient will need a review of medication/anxiety and depression within next 3 months before more refills.  One 90-day fill provided.

## 2018-11-19 NOTE — Telephone Encounter (Signed)
Dr. Raliegh Ip refilled this medication on 06/22/18 for only 30 days.  However, patient is requesting refill.  Please advise if you are of with refill and I can send it for you.

## 2018-11-19 NOTE — Telephone Encounter (Signed)
Pt needs a 90 refill on effexor sent to [facility] [phone]

## 2018-11-20 ENCOUNTER — Other Ambulatory Visit: Payer: Self-pay | Admitting: Family Medicine

## 2018-11-20 ENCOUNTER — Telehealth: Payer: Self-pay | Admitting: Nurse Practitioner

## 2018-11-20 ENCOUNTER — Telehealth: Payer: Self-pay

## 2018-11-20 ENCOUNTER — Other Ambulatory Visit: Payer: Self-pay | Admitting: Nurse Practitioner

## 2018-11-20 DIAGNOSIS — E034 Atrophy of thyroid (acquired): Secondary | ICD-10-CM

## 2018-11-20 DIAGNOSIS — E079 Disorder of thyroid, unspecified: Secondary | ICD-10-CM

## 2018-11-20 NOTE — Telephone Encounter (Signed)
The pt was notified that she need to schedule a visit in the next 3 mths for anxiety. She verified  Understanding.

## 2018-11-20 NOTE — Telephone Encounter (Signed)
The pt was notified and states she will call back to schedule the f/u appt.

## 2018-11-20 NOTE — Telephone Encounter (Signed)
Pt needs refill on synthroid sent to [facility]

## 2018-11-21 MED ORDER — LEVOTHYROXINE SODIUM 100 MCG PO TABS: 100.0000 ug | ORAL_TABLET | Freq: Every day | ORAL | 2 refills | Status: DC

## 2018-11-26 ENCOUNTER — Telehealth: Payer: Self-pay | Admitting: *Deleted

## 2018-11-27 NOTE — Telephone Encounter (Signed)
Patient informed that notes from last visit are being sent to her employer.

## 2018-11-27 NOTE — Telephone Encounter (Signed)
She is waiting for someone to send documentation from [place] to [facility] saying that we gave her a prescription and or a copy of the prescription for her pain meds.  The fax number is [phone]

## 2018-12-18 ENCOUNTER — Encounter: Payer: Self-pay | Admitting: Nurse Practitioner

## 2018-12-18 ENCOUNTER — Other Ambulatory Visit: Payer: Self-pay

## 2018-12-18 ENCOUNTER — Ambulatory Visit (INDEPENDENT_AMBULATORY_CARE_PROVIDER_SITE_OTHER): Payer: Commercial Managed Care - PPO | Admitting: Nurse Practitioner

## 2018-12-18 DIAGNOSIS — A084 Viral intestinal infection, unspecified: Secondary | ICD-10-CM

## 2018-12-18 DIAGNOSIS — J301 Allergic rhinitis due to pollen: Secondary | ICD-10-CM | POA: Diagnosis not present

## 2018-12-18 MED ORDER — ONDANSETRON HCL 4 MG PO TABS: 4.0000 mg | ORAL_TABLET | Freq: Three times a day (TID) | ORAL | 0 refills | Status: DC | PRN

## 2018-12-18 MED ORDER — FLUTICASONE PROPIONATE 50 MCG/ACT NA SUSP: 2.0000 | Freq: Every day | NASAL | 3 refills | Status: AC

## 2018-12-18 NOTE — Progress Notes (Signed)
Telemedicine Encounter: Disclosed to patient at start of encounter that we will provide appropriate telemedicine services.  Patient consents to be treated via phone prior to discussion. - Patient is at her home and is accessed via telephone. - Services are provided by [name] from [facility].  Subjective:   Patient ID: Krista Kennedy, female    DOB: 08/23/1966, 53 y.o.   MRN: [id number]  Krista Kennedy is a 53 y.o. female presenting on 12/18/2018 for Nausea (diarrhea x 1 day. 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 )  HPI Diarrhea Patient had new diarrhea that started last night and is also having nausea.  NO vomiting. - Patient works at assisted living facility - had positive exposures over the weekend.  - Denies fever, chills, sweats. - Patient is still able to eat and drink appropriately.  - diarrhea has improved throughout the day today and is feeling better. - Patient's primary reason for seeking care today is to get a work note stating she may return.    Seasonal allergic rhinitis: Patient also requests refill of flonase today for seasonal allergies.  Has worked well in past to help control symptoms.  Social History   Tobacco Use  . Smoking status: Former Smoker    Packs/day: 0.25    Years: 15.00    Pack years: 3.75    Types: Cigarettes  . Smokeless tobacco: Former Network engineer Use Topics  . Alcohol use: No  . Drug use: No    Review of Systems Per HPI unless specifically indicated above     Objective:    There were no vitals taken for this visit.  Wt Readings from Last 3 Encounters:  11/01/18 171 lb 12.8 oz (77.9 kg)  11/01/18 172 lb (78 kg)  10/23/18 172 lb 12.8 oz (78.4 kg)    Physical Exam Patient remotely monitored.  Verbal communication appropriate.  Cognition normal.   Results for orders placed or performed in visit on 11/01/18  POCT Urinalysis Dipstick  Result Value Ref Range   Color, UA yellow    Clarity, UA clear    Glucose, UA Negative Negative   Bilirubin, UA negative    Ketones, UA negative    Spec Grav, UA 1.015 1.010 - 1.025   Blood, UA small    pH, UA 7.0 5.0 - 8.0   Protein, UA Positive (A) Negative   Urobilinogen, UA 0.2 0.2 or 1.0 E.U./dL   Nitrite, UA negative    Leukocytes, UA Moderate (2+) (A) Negative   Appearance     Odor        Assessment & Plan:   Problem List Items Addressed This Visit      Respiratory   Allergic rhinitis due to pollen Worsening off medications. Refills requested and provided for flonase.  Follow-up prn.   Relevant Medications   fluticasone (FLONASE) 50 MCG/ACT nasal spray    Other Visit Diagnoses    Viral gastroenteritis    -  Primary Likely acute viral gastroenteritis with known exposure at her workplace.  Resolving symptoms at this time today and no work required for 24 hours.  No signs and symptoms consistent with Covid-19 infection.  Plan: 1. May take ondansetron if nausea begins.  Refill provided 2. Continue encouraging adequate oral intake. 3. Work note provided. 4. FOLLOW-UP 3-5 days prn.   Relevant Medications   ondansetron (ZOFRAN) 4 MG tablet      Meds  ordered this encounter  Medications  . ondansetron (ZOFRAN) 4 MG tablet    Sig: Take 1 tablet (4 mg total) by mouth every 8 (eight) hours as needed for nausea or vomiting.    Dispense:  20 tablet    Refill:  0    Order Specific Question:   Supervising Provider    Answer:   Olin Hauser [[id number]]  . fluticasone (FLONASE) 50 MCG/ACT nasal spray    Sig: Place 2 sprays into both nostrils daily.    Dispense:  16 g    Refill:  3    Order Specific Question:   Supervising Provider    Answer:   Olin Hauser [[id number]]    - Time spent in direct consultation with patient via telemedicine about above concerns: 6 minutes  Follow up plan: Follow-up 2-3 days prn if symptoms are not resolving.  [name], DNP, AGPCNP-BC Adult  Gerontology Primary Care Nurse Practitioner [facility] 12/18/2018, 4:03 PM

## 2018-12-25 ENCOUNTER — Other Ambulatory Visit: Payer: Self-pay | Admitting: Nurse Practitioner

## 2018-12-25 DIAGNOSIS — G43711 Chronic migraine without aura, intractable, with status migrainosus: Secondary | ICD-10-CM

## 2018-12-25 MED ORDER — BACLOFEN 10 MG PO TABS: 10.0000 mg | ORAL_TABLET | Freq: Two times a day (BID) | ORAL | 3 refills | Status: DC | PRN

## 2018-12-25 NOTE — Telephone Encounter (Signed)
Pt  called requesting refill on baclofen

## 2018-12-26 ENCOUNTER — Encounter: Payer: Self-pay | Admitting: Anesthesiology

## 2018-12-26 ENCOUNTER — Other Ambulatory Visit: Payer: Self-pay

## 2018-12-26 ENCOUNTER — Ambulatory Visit: Payer: Commercial Managed Care - PPO | Attending: Anesthesiology | Admitting: Anesthesiology

## 2018-12-26 DIAGNOSIS — M545 Low back pain, unspecified: Secondary | ICD-10-CM

## 2018-12-26 DIAGNOSIS — M25549 Pain in joints of unspecified hand: Secondary | ICD-10-CM

## 2018-12-26 DIAGNOSIS — L4050 Arthropathic psoriasis, unspecified: Secondary | ICD-10-CM

## 2018-12-26 DIAGNOSIS — G894 Chronic pain syndrome: Secondary | ICD-10-CM | POA: Diagnosis not present

## 2018-12-26 DIAGNOSIS — M25511 Pain in right shoulder: Secondary | ICD-10-CM

## 2018-12-26 DIAGNOSIS — M25552 Pain in left hip: Secondary | ICD-10-CM | POA: Diagnosis not present

## 2018-12-26 DIAGNOSIS — M25512 Pain in left shoulder: Secondary | ICD-10-CM

## 2018-12-26 DIAGNOSIS — F1190 Opioid use, unspecified, uncomplicated: Secondary | ICD-10-CM

## 2018-12-26 DIAGNOSIS — M25532 Pain in left wrist: Secondary | ICD-10-CM

## 2018-12-26 DIAGNOSIS — M533 Sacrococcygeal disorders, not elsewhere classified: Secondary | ICD-10-CM | POA: Diagnosis not present

## 2018-12-26 DIAGNOSIS — G8929 Other chronic pain: Secondary | ICD-10-CM

## 2018-12-26 MED ORDER — OXYCODONE-ACETAMINOPHEN 10-325 MG PO TABS: 1.0000 | ORAL_TABLET | Freq: Four times a day (QID) | ORAL | 0 refills | Status: DC | PRN

## 2018-12-26 MED ORDER — OXYCODONE HCL 10 MG PO TABS: 10.0000 mg | ORAL_TABLET | Freq: Four times a day (QID) | ORAL | 0 refills | Status: AC

## 2018-12-26 MED ORDER — OXYCODONE HCL 10 MG PO TABS: 10.0000 mg | ORAL_TABLET | Freq: Four times a day (QID) | ORAL | 0 refills | Status: DC

## 2018-12-26 NOTE — Progress Notes (Signed)
Virtual Visit via Telephone Note  I connected with Krista Kennedy on 12/26/18 at  1:45 PM EDT by telephone and verified that I am speaking with the correct person using two identifiers.   I discussed the limitations, risks, security and privacy concerns of performing an evaluation and management service by telephone and the availability of in person appointments. I also discussed with the patient that there may be a patient responsible charge related to this service. The patient expressed understanding and agreed to proceed.   History of Present Illness: I spoke to Krista Kennedy today via conference call regarding her low back pain and diffuse body pain.  She has been doing reasonably well and taking her medications as prescribed.  There was a switch from oxycodone over to [facility] and she feels this was less effective for her.  She like to switch back.  No side effects reported with the medications and based on her previous narcotic assessment sheet she is doing well without difficulty and her medications.  She denies any diverting or illicit use.  No change in the quality characteristic and distribution of the symptoms are noted.  Otherwise she is in her usual state of health.    Observations/Objective:   Assessment and Plan: 1. Chronic pain syndrome   2. Chronic left hip pain   3. Chronic sacroiliac joint pain   4. Psoriatic arthritis ([place])   5. Chronic, continuous use of opioids   6. Chronic pain of both shoulders   7. Pain in multiple finger joints   8. Chronic bilateral low back pain without sciatica   9. Left wrist pain   10. Psoriasis with arthropathy [facility])   Based on the above findings and upon reviewed the [facility] practitioner database information we will give refills of oxycodone 10 mg tablets 4 times a day dated for Shonteria 27 and May 27.  She is been instructed to continue her normal stretching strengthening regimen and follow-up with her primary care physicians.  She is scheduled  for 54-month return to clinic.  Furthermore she is instructed to contact us should she have any troubles with her medications in the meantime.  Follow Up Instructions:    I discussed the assessment and treatment plan with the patient. The patient was provided an opportunity to ask questions and all were answered. The patient agreed with the plan and demonstrated an understanding of the instructions.   The patient was advised to call back or seek an in-person evaluation if the symptoms worsen or if the condition fails to improve as anticipated.  I provided 20 minutes of non-face-to-face time during this encounter.   Molli Barrows, MD

## 2019-01-11 ENCOUNTER — Other Ambulatory Visit: Payer: Self-pay

## 2019-01-11 ENCOUNTER — Encounter: Payer: Self-pay | Admitting: Family Medicine

## 2019-01-11 ENCOUNTER — Telehealth: Payer: Self-pay | Admitting: Nurse Practitioner

## 2019-01-11 ENCOUNTER — Ambulatory Visit (INDEPENDENT_AMBULATORY_CARE_PROVIDER_SITE_OTHER): Payer: Commercial Managed Care - PPO | Admitting: Family Medicine

## 2019-01-11 DIAGNOSIS — L232 Allergic contact dermatitis due to cosmetics: Secondary | ICD-10-CM

## 2019-01-11 MED ORDER — PREDNISONE 10 MG PO TABS: ORAL_TABLET | ORAL | 0 refills | Status: DC

## 2019-01-11 NOTE — Progress Notes (Signed)
Virtual Visit via Telephone The purpose of this virtual visit is to provide medical care while limiting exposure to the novel coronavirus (COVID19) for both patient and office staff.  Consent was obtained for phone visit:  Yes.   Answered questions that patient had about telehealth interaction:  Yes.   I discussed the limitations, risks, security and privacy concerns of performing an evaluation and management service by telephone. I also discussed with the patient that there may be a patient responsible charge related to this service. The patient expressed understanding and agreed to proceed.  Patient Location: Home Provider Location: [facility] (Office)  PCP is [name], AGPCNP-BC - I am currently covering during her maternity leave.   ---------------------------------------------------------------------- Chief Complaint  Patient presents with  . Rash    allergic reaction to hair dye. She recently used a hair spray and developed wheps in her hair scalp, bumps that pus and swollen eyes x 9:00am    S: Reviewed CMA documentation. I have called patient and gathered additional HPI as follows:  Reports that symptoms started acutely this morning 0900 after using hair spray that triggered raised whelps or hives on her scalp, she had similar allergic reaction in past to other products, in past used prednisone with good results. She has some clobetasol topical at home as not tried this yet. Denies any significant pain or drainage or redness, shortness of breath, nausea vomiting, throat swelling  Denies any high risk travel to areas of current concern for COVID19. Denies any known or suspected exposure to person with or possibly with COVID19.  Denies any fevers, chills, sweats, body ache, cough, shortness of breath, sinus pain or pressure, headache, abdominal pain, diarrhea  Past Medical History:  Diagnosis Date  . Arthritis   . Cardiomyopathy ([place])   . CHF (congestive  heart failure) ([place])   . Hyperlipidemia   . Hypertension   . Psoriasis   . Thyroid disease    Social History   Tobacco Use  . Smoking status: Former Smoker    Packs/day: 0.25    Years: 15.00    Pack years: 3.75    Types: Cigarettes  . Smokeless tobacco: Former Network engineer Use Topics  . Alcohol use: No  . Drug use: No    Current Outpatient Medications:  .  Ascorbic Acid (VITAMIN C) 1000 MG tablet, Take 1,000 mg by mouth daily., Disp: , Rfl:  .  baclofen (LIORESAL) 10 MG tablet, Take 1 tablet (10 mg total) by mouth 2 (two) times daily as needed (muscle tension 2/2 migraine)., Disp: 30 each, Rfl: 3 .  betamethasone dipropionate (DIPROLENE) 0.05 % ointment, daily. , Disp: , Rfl: 0 .  carvedilol (COREG) 25 MG tablet, Take 1 tablet (25 mg total) by mouth 2 (two) times daily with a meal., Disp: 180 tablet, Rfl: 1 .  Cholecalciferol (VITAMIN D-1000 MAX ST) 1000 UNITS tablet, Take by mouth daily. , Disp: , Rfl:  .  clobetasol (TEMOVATE) 0.05 % external solution, Apply 1 application topically as needed. , Disp: , Rfl:  .  fluocinonide (LIDEX) 0.05 % external solution, Apply 1 application topically daily. , Disp: , Rfl: 0 .  fluticasone (FLONASE) 50 MCG/ACT nasal spray, Place 2 sprays into both nostrils daily., Disp: 16 g, Rfl: 3 .  ipratropium (ATROVENT) 0.06 % nasal spray, Place 2 sprays into both nostrils 4 (four) times daily. For up to 5-7 days then stop., Disp: 15 mL, Rfl: 0 .  Ixekizumab (TALTZ) 80 MG/ML SOSY, Inject  80 mg into the skin as directed., Disp: , Rfl:  .  levothyroxine (SYNTHROID, LEVOTHROID) 100 MCG tablet, Take 1 tablet (100 mcg total) by mouth daily. NEEDS VISIT with/ Labs, Disp: 30 tablet, Rfl: 2 .  lisinopril (PRINIVIL,ZESTRIL) 20 MG tablet, Take 1 tablet (20 mg total) by mouth daily., Disp: 90 tablet, Rfl: 3 .  metFORMIN (GLUCOPHAGE) 500 MG tablet, Take 1 tablet (500 mg total) by mouth 2 (two) times daily with a meal., Disp: 180 tablet, Rfl: 3 .  naloxone (NARCAN)  nasal spray 4 mg/0.1 mL, To reverse excess sedation from narcotics, Disp: 2 kit, Rfl: 1 .  omeprazole (PRILOSEC) 20 MG capsule, Take 1 capsule (20 mg total) by mouth daily., Disp: 30 capsule, Rfl: 2 .  ondansetron (ZOFRAN) 4 MG tablet, Take 1 tablet (4 mg total) by mouth every 8 (eight) hours as needed for nausea or vomiting., Disp: 20 tablet, Rfl: 0 .  Oxycodone HCl 10 MG TABS, Take 1 tablet (10 mg total) by mouth 4 (four) times daily for 30 days., Disp: 120 tablet, Rfl: 0 .  [START ON 01/25/2019] Oxycodone HCl 10 MG TABS, Take 1 tablet (10 mg total) by mouth 4 (four) times daily for 30 days., Disp: 120 tablet, Rfl: 0 .  SUMAtriptan (IMITREX) 5 MG/ACT nasal spray, Place 1 spray (5 mg total) into the nose every 2 (two) hours as needed for migraine., Disp: 1 Inhaler, Rfl: 0 .  venlafaxine XR (EFFEXOR-XR) 75 MG 24 hr capsule, TAKE 1 CAPSULE BY MOUTH ONCE DAILY WITH  BREAKFAST, Disp: 90 capsule, Rfl: 0 .  predniSONE (DELTASONE) 10 MG tablet, Take 6 tabs with breakfast Day 1, 5 tabs Day 2, 4 tabs Day 3, 3 tabs Day 4, 2 tabs Day 5, 1 tab Day 6., Disp: 21 tablet, Rfl: 0  Depression screen [facility] 2/9 11/01/2018 11/01/2018 08/30/2018  Decreased Interest 0 0 0  Down, Depressed, Hopeless 0 0 0  PHQ - 2 Score 0 0 0  Altered sleeping - - -  Tired, decreased energy - - -  Change in appetite - - -  Feeling bad or failure about yourself  - - -  Trouble concentrating - - -  Moving slowly or fidgety/restless - - -  Suicidal thoughts - - -  PHQ-9 Score - - -  Difficult doing work/chores - - -    No flowsheet data found.  -------------------------------------------------------------------------- O: No physical exam performed due to remote telephone encounter.     -------------------------------------------------------------------------- A&P:  Problem List Items Addressed This Visit    None    Visit Diagnoses    Allergic contact dermatitis due to cosmetics    -  Primary   Relevant Medications    predniSONE (DELTASONE) 10 MG tablet     Clinically with a provoked allergic reaction contact dermatitis on scalp due to hair spray product Similar reactions before to other products  Plan - Treat with prednisone taper over 6 days, 60 to '10mg'$  then off, reviewed precautions May use other topical steroid PRN if need Avoid scratching, monitor for secondary bacterial infection, may use antibiotic ointment Follow-up return criteria given  Meds ordered this encounter  Medications  . predniSONE (DELTASONE) 10 MG tablet    Sig: Take 6 tabs with breakfast Day 1, 5 tabs Day 2, 4 tabs Day 3, 3 tabs Day 4, 2 tabs Day 5, 1 tab Day 6.    Dispense:  21 tablet    Refill:  0    Follow-up: - Return as needed  allergic reaction  Patient verbalizes understanding with the above medical recommendations including the limitation of remote medical advice.  Specific follow-up and call-back criteria were given for patient to follow-up or seek medical care more urgently if needed.   - Time spent in direct consultation with patient on phone: 9 minutes   Nobie Putnam, [facility] 01/11/2019, 2:04 PM

## 2019-01-11 NOTE — Patient Instructions (Addendum)
For allergic scalp reaction  Use prednisone course over 1 week  May use clobetasol as needed as well  Follow-up if not improving  If acute reaction swelling worsening, difficulty breathing, nausea vomiting, throat swelling - seek care at hospital ED  Please schedule a Follow-up Appointment to: Return if symptoms worsen or fail to improve, for allergic reaction.  If you have any other questions or concerns, please feel free to call the office or send a message through [facility]. You may also schedule an earlier appointment if necessary.  Additionally, you may be receiving a survey about your experience at our office within a few days to 1 week by e-mail or mail. We value your feedback.  Nobie Putnam, DO [place]

## 2019-02-13 ENCOUNTER — Other Ambulatory Visit: Payer: Self-pay | Admitting: Nurse Practitioner

## 2019-02-13 DIAGNOSIS — F419 Anxiety disorder, unspecified: Secondary | ICD-10-CM

## 2019-02-13 DIAGNOSIS — F3341 Major depressive disorder, recurrent, in partial remission: Secondary | ICD-10-CM

## 2019-02-19 ENCOUNTER — Encounter: Payer: Self-pay | Admitting: Anesthesiology

## 2019-02-19 ENCOUNTER — Other Ambulatory Visit: Payer: Self-pay

## 2019-02-19 ENCOUNTER — Ambulatory Visit: Payer: Commercial Managed Care - PPO | Attending: Anesthesiology | Admitting: Anesthesiology

## 2019-02-19 DIAGNOSIS — G894 Chronic pain syndrome: Secondary | ICD-10-CM

## 2019-02-19 DIAGNOSIS — F1190 Opioid use, unspecified, uncomplicated: Secondary | ICD-10-CM

## 2019-02-19 DIAGNOSIS — M25512 Pain in left shoulder: Secondary | ICD-10-CM

## 2019-02-19 DIAGNOSIS — M5442 Lumbago with sciatica, left side: Secondary | ICD-10-CM

## 2019-02-19 DIAGNOSIS — M25549 Pain in joints of unspecified hand: Secondary | ICD-10-CM

## 2019-02-19 DIAGNOSIS — M25552 Pain in left hip: Secondary | ICD-10-CM

## 2019-02-19 DIAGNOSIS — M25511 Pain in right shoulder: Secondary | ICD-10-CM

## 2019-02-19 DIAGNOSIS — M533 Sacrococcygeal disorders, not elsewhere classified: Secondary | ICD-10-CM

## 2019-02-19 DIAGNOSIS — L4050 Arthropathic psoriasis, unspecified: Secondary | ICD-10-CM

## 2019-02-19 DIAGNOSIS — M5441 Lumbago with sciatica, right side: Secondary | ICD-10-CM

## 2019-02-19 DIAGNOSIS — L409 Psoriasis, unspecified: Secondary | ICD-10-CM

## 2019-02-19 DIAGNOSIS — G8929 Other chronic pain: Secondary | ICD-10-CM

## 2019-02-19 DIAGNOSIS — M25532 Pain in left wrist: Secondary | ICD-10-CM

## 2019-02-19 MED ORDER — OXYCODONE HCL 10 MG PO TABS: 10.0000 mg | ORAL_TABLET | Freq: Four times a day (QID) | ORAL | 0 refills | Status: AC

## 2019-02-19 NOTE — Progress Notes (Signed)
Virtual Visit via Video Note  I connected with Krista Kennedy on 02/19/19 at  3:15 PM EDT by a video enabled telemedicine application and verified that I am speaking with the correct person using two identifiers.  Location: Patient: home Provider: [facility]   I discussed the limitations of evaluation and management by telemedicine and the availability of in person appointments. The patient expressed understanding and agreed to proceed.  History of Present Illness: I spoke today with Krista Kennedy via video voice call.  She states that the style quality and severity of pain that she is been experiencing and the fingers and multiple joints is stable in nature with no significant changes recently.  She still taking her medications as prescribed and these continue to give her good relief.  She notes that she derives good functional lifestyle provement with the medications and no side effects.  Unfortunately she has failed more conservative therapy.  She reports that these are working well and the style quality of the pain is stable in nature.  No other changes are reported at this point.   Observations/Objective:  Current Outpatient Medications:  .  Ascorbic Acid (VITAMIN C) 1000 MG tablet, Take 1,000 mg by mouth daily., Disp: , Rfl:  .  baclofen (LIORESAL) 10 MG tablet, Take 1 tablet (10 mg total) by mouth 2 (two) times daily as needed (muscle tension 2/2 migraine)., Disp: 30 each, Rfl: 3 .  betamethasone dipropionate (DIPROLENE) 0.05 % ointment, daily. , Disp: , Rfl: 0 .  carvedilol (COREG) 25 MG tablet, Take 1 tablet (25 mg total) by mouth 2 (two) times daily with a meal., Disp: 180 tablet, Rfl: 1 .  Cholecalciferol (VITAMIN D-1000 MAX ST) 1000 UNITS tablet, Take by mouth daily. , Disp: , Rfl:  .  clobetasol (TEMOVATE) 0.05 % external solution, Apply 1 application topically as needed. , Disp: , Rfl:  .  fluocinonide (LIDEX) 0.05 % external solution, Apply 1 application topically daily.  , Disp: , Rfl: 0 .  fluticasone (FLONASE) 50 MCG/ACT nasal spray, Place 2 sprays into both nostrils daily., Disp: 16 g, Rfl: 3 .  ipratropium (ATROVENT) 0.06 % nasal spray, Place 2 sprays into both nostrils 4 (four) times daily. For up to 5-7 days then stop., Disp: 15 mL, Rfl: 0 .  Ixekizumab (TALTZ) 80 MG/ML SOSY, Inject 80 mg into the skin as directed., Disp: , Rfl:  .  levothyroxine (SYNTHROID, LEVOTHROID) 100 MCG tablet, Take 1 tablet (100 mcg total) by mouth daily. NEEDS VISIT with/ Labs, Disp: 30 tablet, Rfl: 2 .  lisinopril (PRINIVIL,ZESTRIL) 20 MG tablet, Take 1 tablet (20 mg total) by mouth daily., Disp: 90 tablet, Rfl: 3 .  metFORMIN (GLUCOPHAGE) 500 MG tablet, Take 1 tablet (500 mg total) by mouth 2 (two) times daily with a meal., Disp: 180 tablet, Rfl: 3 .  naloxone (NARCAN) nasal spray 4 mg/0.1 mL, To reverse excess sedation from narcotics, Disp: 2 kit, Rfl: 1 .  omeprazole (PRILOSEC) 20 MG capsule, Take 1 capsule (20 mg total) by mouth daily., Disp: 30 capsule, Rfl: 2 .  ondansetron (ZOFRAN) 4 MG tablet, Take 1 tablet (4 mg total) by mouth every 8 (eight) hours as needed for nausea or vomiting., Disp: 20 tablet, Rfl: 0 .  [START ON 02/24/2019] Oxycodone HCl 10 MG TABS, Take 1 tablet (10 mg total) by mouth 4 (four) times daily for 30 days., Disp: 120 tablet, Rfl: 0 .  [START ON 03/26/2019] Oxycodone HCl 10 MG TABS, Take 1 tablet (10  mg total) by mouth 4 (four) times daily for 30 days., Disp: 120 tablet, Rfl: 0 .  predniSONE (DELTASONE) 10 MG tablet, Take 6 tabs with breakfast Day 1, 5 tabs Day 2, 4 tabs Day 3, 3 tabs Day 4, 2 tabs Day 5, 1 tab Day 6., Disp: 21 tablet, Rfl: 0 .  SUMAtriptan (IMITREX) 5 MG/ACT nasal spray, Place 1 spray (5 mg total) into the nose every 2 (two) hours as needed for migraine., Disp: 1 Inhaler, Rfl: 0 .  venlafaxine XR (EFFEXOR-XR) 75 MG 24 hr capsule, TAKE 1 CAPSULE BY MOUTH EVERY DAY WITH BREAKFAST, Disp: 90 capsule, Rfl: 0  Assessment and Plan: 1. Psoriasis  with arthropathy ([place])   2. Psoriasis   3. Chronic pain of both shoulders   4. Pain in multiple finger joints   5. Chronic bilateral low back pain with bilateral sciatica   6. Chronic pain syndrome   7. Chronic left hip pain   8. Left wrist pain   9. Chronic sacroiliac joint pain   10. Chronic, continuous use of opioids   Based on our discussion today I am going to refill her medications dated for June 21 and July 21 for the oxycodone 10 mg tablets 4 times daily.  We will have her continue with her routine follow-up with her primary care physicians as well.  She is scheduled for return to clinic in 2 months.   Follow Up Instructions:    I discussed the assessment and treatment plan with the patient. The patient was provided an opportunity to ask questions and all were answered. The patient agreed with the plan and demonstrated an understanding of the instructions.   The patient was advised to call back or seek an in-person evaluation if the symptoms worsen or if the condition fails to improve as anticipated.  I provided 30 minutes of non-face-to-face time during this encounter.   Molli Barrows, MD

## 2019-02-20 ENCOUNTER — Other Ambulatory Visit: Payer: Self-pay | Admitting: Nurse Practitioner

## 2019-02-20 ENCOUNTER — Telehealth: Payer: Self-pay

## 2019-02-20 DIAGNOSIS — G43711 Chronic migraine without aura, intractable, with status migrainosus: Secondary | ICD-10-CM

## 2019-02-20 DIAGNOSIS — F419 Anxiety disorder, unspecified: Secondary | ICD-10-CM

## 2019-02-20 MED ORDER — VENLAFAXINE HCL ER 150 MG PO CP24: 150.0000 mg | ORAL_CAPSULE | Freq: Every day | ORAL | 1 refills | Status: DC

## 2019-02-20 NOTE — Telephone Encounter (Signed)
Attempted to contact the patient. No answer, left a detail message on pt vm to return my call.

## 2019-02-20 NOTE — Telephone Encounter (Signed)
The pt called requesting that you increase her Effexor for a 30 days script to see if that would help with managing her anxiety.

## 2019-02-20 NOTE — Telephone Encounter (Signed)
New rx sent for Venlafaxine (Effexor) 150mg  daily, 24 hr capsule.  Increased from previous 75mg  dose.  She has taken this dose before based on review of past records.  Sent 30 day rx to [place] 02/20/2019, 1:14 PM

## 2019-03-05 ENCOUNTER — Telehealth: Payer: Self-pay

## 2019-03-05 ENCOUNTER — Encounter: Payer: Self-pay | Admitting: Family Medicine

## 2019-03-05 ENCOUNTER — Other Ambulatory Visit: Payer: Self-pay

## 2019-03-05 ENCOUNTER — Ambulatory Visit (INDEPENDENT_AMBULATORY_CARE_PROVIDER_SITE_OTHER): Payer: Commercial Managed Care - PPO | Admitting: Family Medicine

## 2019-03-05 DIAGNOSIS — R300 Dysuria: Secondary | ICD-10-CM | POA: Diagnosis not present

## 2019-03-05 DIAGNOSIS — N3001 Acute cystitis with hematuria: Secondary | ICD-10-CM | POA: Diagnosis not present

## 2019-03-05 LAB — POCT URINALYSIS DIPSTICK
Bilirubin, UA: NEGATIVE
Glucose, UA: NEGATIVE
Ketones, UA: NEGATIVE
Nitrite, UA: POSITIVE
Protein, UA: NEGATIVE
Spec Grav, UA: >=1.03 — AB (ref 1.010–1.025)
Urobilinogen, UA: 0.2 E.U./dL
pH, UA: 5 (ref 5.0–8.0)

## 2019-03-05 MED ORDER — CEPHALEXIN 500 MG PO CAPS: 500.0000 mg | ORAL_CAPSULE | Freq: Three times a day (TID) | ORAL | 0 refills | Status: DC

## 2019-03-05 NOTE — Progress Notes (Signed)
Virtual Visit via Telephone The purpose of this virtual visit is to provide medical care while limiting exposure to the novel coronavirus (COVID19) for both patient and office staff.  Consent was obtained for phone visit:  Yes.   Answered questions that patient had about telehealth interaction:  Yes.   I discussed the limitations, risks, security and privacy concerns of performing an evaluation and management service by telephone. I also discussed with the patient that there may be a patient responsible charge related to this service. The patient expressed understanding and agreed to proceed.  Patient Location: Home Provider Location: [facility] (Office)  PCP is [name], AGPCNP-BC - I am currently covering during her maternity leave.  ---------------------------------------------------------------------- Chief Complaint  Patient presents with  . Urinary Tract Infection    back pain, painful urination, frquency and urgency onset week OTC Azo taken from past 2 days    S: Reviewed CMA documentation. I have called patient and gathered additional HPI as follows:  UTI Reports that symptoms started 1 week ago with dysuria, urinary frequency and urgency, pain. Similar to prior UTI, last in 10/2018 treated with keflex and it resolved. She has typical symptoms in past. Mild UTI takes cranberry and it resolves. - Tried OTC AZO, and Cranberry juice without improvement Denies any fevers, chills, sweats, body ache, cough, shortness of breath, sinus pain or pressure, headache, abdominal pain, diarrhea  Past Medical History:  Diagnosis Date  . Arthritis   . Cardiomyopathy ([place])   . CHF (congestive heart failure) ([place])   . Hyperlipidemia   . Hypertension   . Psoriasis   . Thyroid disease    Social History   Tobacco Use  . Smoking status: Former Smoker    Packs/day: 0.25    Years: 15.00    Pack years: 3.75    Types: Cigarettes  . Smokeless tobacco: Former Chief Strategy Officer Use Topics  . Alcohol use: No  . Drug use: No    Current Outpatient Medications:  .  Ascorbic Acid (VITAMIN C) 1000 MG tablet, Take 1,000 mg by mouth daily., Disp: , Rfl:  .  baclofen (LIORESAL) 10 MG tablet, Take 1 tablet (10 mg total) by mouth 2 (two) times daily as needed (muscle tension 2/2 migraine)., Disp: 30 each, Rfl: 3 .  betamethasone dipropionate (DIPROLENE) 0.05 % ointment, daily. , Disp: , Rfl: 0 .  carvedilol (COREG) 25 MG tablet, Take 1 tablet (25 mg total) by mouth 2 (two) times daily with a meal., Disp: 180 tablet, Rfl: 1 .  Cholecalciferol (VITAMIN D-1000 MAX ST) 1000 UNITS tablet, Take by mouth daily. , Disp: , Rfl:  .  clobetasol (TEMOVATE) 0.05 % external solution, Apply 1 application topically as needed. , Disp: , Rfl:  .  fluocinonide (LIDEX) 0.05 % external solution, Apply 1 application topically daily. , Disp: , Rfl: 0 .  fluticasone (FLONASE) 50 MCG/ACT nasal spray, Place 2 sprays into both nostrils daily., Disp: 16 g, Rfl: 3 .  ipratropium (ATROVENT) 0.06 % nasal spray, Place 2 sprays into both nostrils 4 (four) times daily. For up to 5-7 days then stop. (Patient taking differently: Place 2 sprays into both nostrils as needed. For up to 5-7 days then stop.), Disp: 15 mL, Rfl: 0 .  Ixekizumab (TALTZ) 80 MG/ML SOSY, Inject 80 mg into the skin as directed., Disp: , Rfl:  .  levothyroxine (SYNTHROID, LEVOTHROID) 100 MCG tablet, Take 1 tablet (100 mcg total) by mouth daily. NEEDS VISIT with/ Labs, Disp:  30 tablet, Rfl: 2 .  lisinopril (PRINIVIL,ZESTRIL) 20 MG tablet, Take 1 tablet (20 mg total) by mouth daily., Disp: 90 tablet, Rfl: 3 .  metFORMIN (GLUCOPHAGE) 500 MG tablet, Take 1 tablet (500 mg total) by mouth 2 (two) times daily with a meal., Disp: 180 tablet, Rfl: 3 .  Oxycodone HCl 10 MG TABS, Take 1 tablet (10 mg total) by mouth 4 (four) times daily for 30 days., Disp: 120 tablet, Rfl: 0 .  SUMAtriptan (IMITREX) 5 MG/ACT nasal spray, PLACE 1 SPRAY INTO THE  NOSE EVERY 2 HOURS AS NEEDED FOR MIGRAINE, Disp: 1 Inhaler, Rfl: 1 .  venlafaxine XR (EFFEXOR-XR) 150 MG 24 hr capsule, Take 1 capsule (150 mg total) by mouth daily with breakfast., Disp: 30 capsule, Rfl: 1 .  cephALEXin (KEFLEX) 500 MG capsule, Take 1 capsule (500 mg total) by mouth 3 (three) times daily. For 7 days, Disp: 21 capsule, Rfl: 0 .  naloxone (NARCAN) nasal spray 4 mg/0.1 mL, To reverse excess sedation from narcotics (Patient not taking: Reported on 03/05/2019), Disp: 2 kit, Rfl: 1 .  omeprazole (PRILOSEC) 20 MG capsule, Take 1 capsule (20 mg total) by mouth daily. (Patient not taking: Reported on 03/05/2019), Disp: 30 capsule, Rfl: 2 .  ondansetron (ZOFRAN) 4 MG tablet, Take 1 tablet (4 mg total) by mouth every 8 (eight) hours as needed for nausea or vomiting. (Patient not taking: Reported on 03/05/2019), Disp: 20 tablet, Rfl: 0 .  [START ON 03/26/2019] Oxycodone HCl 10 MG TABS, Take 1 tablet (10 mg total) by mouth 4 (four) times daily for 30 days., Disp: 120 tablet, Rfl: 0 .  predniSONE (DELTASONE) 10 MG tablet, Take 6 tabs with breakfast Day 1, 5 tabs Day 2, 4 tabs Day 3, 3 tabs Day 4, 2 tabs Day 5, 1 tab Day 6., Disp: 21 tablet, Rfl: 0  Depression screen [facility] 2/9 03/05/2019 11/01/2018 11/01/2018  Decreased Interest 1 0 0  Down, Depressed, Hopeless 0 0 0  PHQ - 2 Score 1 0 0  Altered sleeping 0 - -  Tired, decreased energy 1 - -  Change in appetite 1 - -  Feeling bad or failure about yourself  0 - -  Trouble concentrating 1 - -  Moving slowly or fidgety/restless 0 - -  Suicidal thoughts 0 - -  PHQ-9 Score 4 - -  Difficult doing work/chores Not difficult at all - -    No flowsheet data found.  -------------------------------------------------------------------------- O: No physical exam performed due to remote telephone encounter.  Lab results reviewed.  Recent Results (from the past 2160 hour(s))  POCT Urinalysis Dipstick     Status: Abnormal   Collection Time: 03/05/19  2:20  PM  Result Value Ref Range   Color, UA dark yellow    Clarity, UA clear    Glucose, UA Negative Negative   Bilirubin, UA negative    Ketones, UA negative    Spec Grav, UA >=1.030 (A) 1.010 - 1.025   Blood, UA large    pH, UA 5.0 5.0 - 8.0   Protein, UA Negative Negative   Urobilinogen, UA 0.2 0.2 or 1.0 E.U./dL   Nitrite, UA positive    Leukocytes, UA Large (3+) (A) Negative   Appearance     Odor      -------------------------------------------------------------------------- A&P:  Problem List Items Addressed This Visit    None    Visit Diagnoses    Acute cystitis with hematuria    -  Primary   Relevant Medications  cephALEXin (KEFLEX) 500 MG capsule   Other Relevant Orders   Urine Culture   Dysuria       Relevant Orders   POCT Urinalysis Dipstick (Completed)   Urine Culture     Clinically consistent with UTI and confirmed on UA. No recent UTIs or abx courses, last was Keflex in 10/2018 for similar URI. No concern for pyelo today (no systemic symptoms, neg fever, back pain, n/v).  Plan: 1. UA / micro - positive nitrite, large leuks, blood/hgb large 2. Ordered Urine culture 3. Keflex '500mg'$  TID x 7 days 4. Improve PO hydration 5. RTC if no improvement 1 week, red flags given to return sooner   Meds ordered this encounter  Medications  . cephALEXin (KEFLEX) 500 MG capsule    Sig: Take 1 capsule (500 mg total) by mouth 3 (three) times daily. For 7 days    Dispense:  21 capsule    Refill:  0    Follow-up: - Return in 1 week as needed UTI if not improved  Patient verbalizes understanding with the above medical recommendations including the limitation of remote medical advice.  Specific follow-up and call-back criteria were given for patient to follow-up or seek medical care more urgently if needed.   - Time spent in direct consultation with patient on phone: 9 minutes  Nobie Putnam, [facility] 03/05/2019, 4:21 PM

## 2019-03-05 NOTE — Telephone Encounter (Signed)
Pt scheduled for possible UTI.

## 2019-03-05 NOTE — Patient Instructions (Signed)
1. You have a Urinary Tract Infection - this is very common, your symptoms are reassuring and you should get better within 1 week on the antibiotics - Start Keflex 500mg  3 times daily for next 7 days, complete entire course, even if feeling better - We sent urine for a culture, we will call you within next few days if we need to change antibiotics - Please drink plenty of fluids, improve hydration over next 1 week  If symptoms worsening, developing nausea / vomiting, worsening back pain, fevers / chills / sweats, then please return for re-evaluation sooner.   Please schedule a Follow-up Appointment to: No follow-ups on file.  If you have any other questions or concerns, please feel free to call the office or send a message through [place]. You may also schedule an earlier appointment if necessary.  Additionally, you may be receiving a survey about your experience at our office within a few days to 1 week by e-mail or mail. We value your feedback.  Nobie Putnam, DO [place]

## 2019-03-06 ENCOUNTER — Telehealth: Payer: Self-pay

## 2019-03-06 ENCOUNTER — Ambulatory Visit: Payer: Commercial Managed Care - PPO | Attending: Anesthesiology | Admitting: Anesthesiology

## 2019-03-06 DIAGNOSIS — M25511 Pain in right shoulder: Secondary | ICD-10-CM

## 2019-03-06 DIAGNOSIS — M25532 Pain in left wrist: Secondary | ICD-10-CM

## 2019-03-06 DIAGNOSIS — M25512 Pain in left shoulder: Secondary | ICD-10-CM

## 2019-03-06 DIAGNOSIS — L232 Allergic contact dermatitis due to cosmetics: Secondary | ICD-10-CM

## 2019-03-06 DIAGNOSIS — M25549 Pain in joints of unspecified hand: Secondary | ICD-10-CM

## 2019-03-06 DIAGNOSIS — M533 Sacrococcygeal disorders, not elsewhere classified: Secondary | ICD-10-CM

## 2019-03-06 DIAGNOSIS — M5442 Lumbago with sciatica, left side: Secondary | ICD-10-CM

## 2019-03-06 DIAGNOSIS — L409 Psoriasis, unspecified: Secondary | ICD-10-CM | POA: Diagnosis not present

## 2019-03-06 DIAGNOSIS — F1190 Opioid use, unspecified, uncomplicated: Secondary | ICD-10-CM

## 2019-03-06 DIAGNOSIS — M25552 Pain in left hip: Secondary | ICD-10-CM

## 2019-03-06 DIAGNOSIS — G8929 Other chronic pain: Secondary | ICD-10-CM

## 2019-03-06 DIAGNOSIS — G894 Chronic pain syndrome: Secondary | ICD-10-CM

## 2019-03-06 DIAGNOSIS — M5441 Lumbago with sciatica, right side: Secondary | ICD-10-CM

## 2019-03-06 DIAGNOSIS — L4050 Arthropathic psoriasis, unspecified: Secondary | ICD-10-CM | POA: Diagnosis not present

## 2019-03-06 MED ORDER — PREDNISONE 10 MG PO TABS: ORAL_TABLET | ORAL | 0 refills | Status: DC

## 2019-03-06 NOTE — Patient Instructions (Signed)
Rhythm  

## 2019-03-06 NOTE — Telephone Encounter (Signed)
She wants a nurse to call her regarding the pain she is having in her hip. I offered an appointment but she wanted one today and I couldn't get her in today.

## 2019-03-06 NOTE — Telephone Encounter (Signed)
Scheduled patient for today, sent Dr. Andree Elk a message via Epic messenger letting him know.

## 2019-03-06 NOTE — Progress Notes (Signed)
Virtual Visit via Video Note  I connected with Krista Kennedy on 03/06/19 at  2:00 PM EDT by a video enabled telemedicine application and verified that I am speaking with the correct person using two identifiers.  Location: Patient: Home Provider: Pain control center   I discussed the limitations of evaluation and management by telemedicine and the availability of in person appointments. The patient expressed understanding and agreed to proceed.  History of Present Illness: I spoke with Krista Kennedy over the phone today via video voice conferencing.  She has had a lot of new onset left hip pain.  This is relatively new for her and she denies any recent fall or traumatic event.  She has had some irritation in her low back as well occasionally this radiates into the calves.  No weakness is reported and her bowel and bladder function has been stable.  She does have a history of diffuse arthritis and has taken steroid Dosepaks in the past with success.  She is also taking her medication with the Percocet 10 mg tablets 4 times a day and based on our conversation she is getting good relief with this however this pain in the left hip has been considerable.  She is asked if she can go up in her dosing on the Percocet.  She denies any diverting or illicit use.  Otherwise her situation has been stable and she is had no side effects with the opioid medications.    Observations/Objective:  Current Outpatient Medications:  .  Ascorbic Acid (VITAMIN C) 1000 MG tablet, Take 1,000 mg by mouth daily., Disp: , Rfl:  .  baclofen (LIORESAL) 10 MG tablet, Take 1 tablet (10 mg total) by mouth 2 (two) times daily as needed (muscle tension 2/2 migraine)., Disp: 30 each, Rfl: 3 .  betamethasone dipropionate (DIPROLENE) 0.05 % ointment, daily. , Disp: , Rfl: 0 .  carvedilol (COREG) 25 MG tablet, Take 1 tablet (25 mg total) by mouth 2 (two) times daily with a meal., Disp: 180 tablet, Rfl: 1 .  cephALEXin (KEFLEX) 500 MG capsule,  Take 1 capsule (500 mg total) by mouth 3 (three) times daily. For 7 days, Disp: 21 capsule, Rfl: 0 .  Cholecalciferol (VITAMIN D-1000 MAX ST) 1000 UNITS tablet, Take by mouth daily. , Disp: , Rfl:  .  clobetasol (TEMOVATE) 0.05 % external solution, Apply 1 application topically as needed. , Disp: , Rfl:  .  fluocinonide (LIDEX) 0.05 % external solution, Apply 1 application topically daily. , Disp: , Rfl: 0 .  fluticasone (FLONASE) 50 MCG/ACT nasal spray, Place 2 sprays into both nostrils daily., Disp: 16 g, Rfl: 3 .  ipratropium (ATROVENT) 0.06 % nasal spray, Place 2 sprays into both nostrils 4 (four) times daily. For up to 5-7 days then stop. (Patient taking differently: Place 2 sprays into both nostrils as needed. For up to 5-7 days then stop.), Disp: 15 mL, Rfl: 0 .  Ixekizumab (TALTZ) 80 MG/ML SOSY, Inject 80 mg into the skin as directed., Disp: , Rfl:  .  levothyroxine (SYNTHROID, LEVOTHROID) 100 MCG tablet, Take 1 tablet (100 mcg total) by mouth daily. NEEDS VISIT with/ Labs, Disp: 30 tablet, Rfl: 2 .  lisinopril (PRINIVIL,ZESTRIL) 20 MG tablet, Take 1 tablet (20 mg total) by mouth daily., Disp: 90 tablet, Rfl: 3 .  metFORMIN (GLUCOPHAGE) 500 MG tablet, Take 1 tablet (500 mg total) by mouth 2 (two) times daily with a meal., Disp: 180 tablet, Rfl: 3 .  naloxone (NARCAN) nasal spray 4 mg/0.1  mL, To reverse excess sedation from narcotics (Patient not taking: Reported on 03/05/2019), Disp: 2 kit, Rfl: 1 .  omeprazole (PRILOSEC) 20 MG capsule, Take 1 capsule (20 mg total) by mouth daily. (Patient not taking: Reported on 03/05/2019), Disp: 30 capsule, Rfl: 2 .  ondansetron (ZOFRAN) 4 MG tablet, Take 1 tablet (4 mg total) by mouth every 8 (eight) hours as needed for nausea or vomiting. (Patient not taking: Reported on 03/05/2019), Disp: 20 tablet, Rfl: 0 .  Oxycodone HCl 10 MG TABS, Take 1 tablet (10 mg total) by mouth 4 (four) times daily for 30 days., Disp: 120 tablet, Rfl: 0 .  [START ON 03/26/2019]  Oxycodone HCl 10 MG TABS, Take 1 tablet (10 mg total) by mouth 4 (four) times daily for 30 days., Disp: 120 tablet, Rfl: 0 .  predniSONE (DELTASONE) 10 MG tablet, Take 6 tabs with breakfast Day 1 and day 2... 5 tabs Day 3 and 4 , 4 tabs Day 5 and 6...3 tabs Day 7 and 8 ..2 tabs day 9 and 10.... 1 tabs day 11 and 12 and stop, Disp: 42 tablet, Rfl: 0 .  SUMAtriptan (IMITREX) 5 MG/ACT nasal spray, PLACE 1 SPRAY INTO THE NOSE EVERY 2 HOURS AS NEEDED FOR MIGRAINE, Disp: 1 Inhaler, Rfl: 1 .  venlafaxine XR (EFFEXOR-XR) 150 MG 24 hr capsule, Take 1 capsule (150 mg total) by mouth daily with breakfast., Disp: 30 capsule, Rfl: 1  Assessment and Plan: 1. Allergic contact dermatitis due to cosmetics    1. Psoriasis with arthropathy ([place])   2. Allergic contact dermatitis due to cosmetics   3. Psoriasis   4. Chronic pain of both shoulders   5. Pain in multiple finger joints   6. Chronic bilateral low back pain with bilateral sciatica   7. Chronic pain syndrome   8. Chronic left hip pain   9. Left wrist pain   10. Chronic sacroiliac joint pain   11. Chronic, continuous use of opioids   Based on our discussion today and after review of the [facility] practitioner database and one refill her Percocet for the next 2 months.  I am also calling her in a prednisone Dosepak tapering dose as reviewed with her today.  If she is not better in 7 days she is instructed to contact the pain control center for further management and consideration of an epidural steroid injection.  Furthermore she is instructed to continue follow-up with her primary care physicians for her baseline medical care.  Follow Up Instructions:    I discussed the assessment and treatment plan with the patient. The patient was provided an opportunity to ask questions and all were answered. The patient agreed with the plan and demonstrated an understanding of the instructions.   The patient was advised to call back or seek an in-person  evaluation if the symptoms worsen or if the condition fails to improve as anticipated.  I provided 30 minutes of non-face-to-face time during this encounter.   Molli Barrows, MD

## 2019-03-06 NOTE — Telephone Encounter (Signed)
OK to place for a virtual visit with Dr Andree Elk today per Alinda Sierras RN

## 2019-03-07 LAB — URINE CULTURE
MICRO NUMBER:: 621496
SPECIMEN QUALITY:: ADEQUATE

## 2019-03-13 ENCOUNTER — Telehealth: Payer: Self-pay | Admitting: *Deleted

## 2019-03-13 ENCOUNTER — Telehealth: Payer: Self-pay

## 2019-03-13 ENCOUNTER — Other Ambulatory Visit: Payer: Self-pay

## 2019-03-13 ENCOUNTER — Ambulatory Visit: Payer: Commercial Managed Care - PPO | Admitting: Family Medicine

## 2019-03-13 NOTE — Telephone Encounter (Signed)
Pt called wanting a referral for ortho or someone else to address her chronic back pain. The pt back pain is currently managed by pain management. She was last seen on July 1st at [facility] by Dr. Quita Skye. She was treated with  Percocet and  prednisone Dosepak tapering.  She was told if her symptoms are not better in 7 days to contact the pain control center for further management and consideration of an epidural steroid injection. I spoke with Dr. Raliegh Ip and the patient was recommended to follow back up with Pain Management to discuss her concerns. I offered her an appt with her PCP Lauren in 1.5 weeks to address her chronic issue. She declined the appt and stated she will contact Ortho on her own, because her insurance do not require a referral. She patient verbalize understanding, no questions or concerns.

## 2019-03-13 NOTE — Telephone Encounter (Signed)
Spoke with patient.  She states that the pain is worse in her back, left hip, radiating down left leg to calf in the back.  Also complains of pain in the left side of her neck.  States she feels like her back is swollen. States she has been on Prednisone but it is not helping.  Patient states she does not want injections in her spine.  I informed patient that I would pass this information to Dr Andree Elk and would notify her of any new orders.

## 2019-03-20 ENCOUNTER — Other Ambulatory Visit: Payer: Self-pay | Admitting: Nurse Practitioner

## 2019-03-20 ENCOUNTER — Other Ambulatory Visit: Payer: Self-pay | Admitting: Family Medicine

## 2019-03-20 DIAGNOSIS — I5022 Chronic systolic (congestive) heart failure: Secondary | ICD-10-CM

## 2019-03-20 DIAGNOSIS — I10 Essential (primary) hypertension: Secondary | ICD-10-CM

## 2019-03-20 MED ORDER — CARVEDILOL 25 MG PO TABS: 25.0000 mg | ORAL_TABLET | Freq: Two times a day (BID) | ORAL | 0 refills | Status: DC

## 2019-03-20 MED ORDER — LISINOPRIL 20 MG PO TABS: 20.0000 mg | ORAL_TABLET | Freq: Every day | ORAL | 0 refills | Status: DC

## 2019-03-20 NOTE — Telephone Encounter (Signed)
Pt  Called reqeusting refill on  Carvedilol called into  [facility]  [place],  Lisinopril called into [facility] [place]

## 2019-03-25 ENCOUNTER — Telehealth: Payer: Self-pay | Admitting: Anesthesiology

## 2019-03-25 NOTE — Telephone Encounter (Signed)
Pt called stating she is leaving to go out of town today and wants to know if she can fill her rx today instead of tomorrow

## 2019-03-25 NOTE — Telephone Encounter (Signed)
Spoke with patient to let her know that I would approve a 1 day early refill.  Instructed that she would still need to make her medications last until next fill date, 04/25/19, patient verbalizes u/o information.    Called to [place] to give approval.

## 2019-04-05 ENCOUNTER — Other Ambulatory Visit: Payer: Self-pay | Admitting: Nurse Practitioner

## 2019-04-05 ENCOUNTER — Other Ambulatory Visit: Payer: Self-pay | Admitting: Family Medicine

## 2019-04-05 DIAGNOSIS — F419 Anxiety disorder, unspecified: Secondary | ICD-10-CM

## 2019-04-05 DIAGNOSIS — E079 Disorder of thyroid, unspecified: Secondary | ICD-10-CM

## 2019-04-15 ENCOUNTER — Other Ambulatory Visit: Payer: Self-pay | Admitting: Anesthesiology

## 2019-04-15 DIAGNOSIS — L232 Allergic contact dermatitis due to cosmetics: Secondary | ICD-10-CM

## 2019-04-30 ENCOUNTER — Other Ambulatory Visit: Payer: Self-pay | Admitting: Nurse Practitioner

## 2019-04-30 DIAGNOSIS — E079 Disorder of thyroid, unspecified: Secondary | ICD-10-CM

## 2019-04-30 NOTE — Telephone Encounter (Signed)
Pt called requesting refill on synthroid called into  [facility]

## 2019-05-01 ENCOUNTER — Telehealth: Payer: Self-pay

## 2019-05-01 DIAGNOSIS — E079 Disorder of thyroid, unspecified: Secondary | ICD-10-CM

## 2019-05-01 MED ORDER — LEVOTHYROXINE SODIUM 100 MCG PO TABS: ORAL_TABLET | ORAL | 0 refills | Status: DC

## 2019-05-01 NOTE — Telephone Encounter (Signed)
The prescription was sent to the pharmacy.

## 2019-05-03 ENCOUNTER — Telehealth: Payer: Self-pay | Admitting: Nurse Practitioner

## 2019-05-03 NOTE — Telephone Encounter (Signed)
Pt called

## 2019-05-06 ENCOUNTER — Other Ambulatory Visit: Payer: Self-pay | Admitting: Nurse Practitioner

## 2019-05-06 DIAGNOSIS — E079 Disorder of thyroid, unspecified: Secondary | ICD-10-CM

## 2019-05-06 DIAGNOSIS — F419 Anxiety disorder, unspecified: Secondary | ICD-10-CM

## 2019-05-06 NOTE — Telephone Encounter (Signed)
Pt. Called requesting refill  Levothyroxine, effexor-XR 150 mg .  Mail order

## 2019-05-08 MED ORDER — VENLAFAXINE HCL ER 150 MG PO CP24: ORAL_CAPSULE | ORAL | 0 refills | Status: DC

## 2019-05-08 MED ORDER — LEVOTHYROXINE SODIUM 100 MCG PO TABS: ORAL_TABLET | ORAL | 0 refills | Status: DC

## 2019-05-16 ENCOUNTER — Other Ambulatory Visit: Payer: Self-pay

## 2019-05-16 DIAGNOSIS — F419 Anxiety disorder, unspecified: Secondary | ICD-10-CM

## 2019-05-16 MED ORDER — VENLAFAXINE HCL ER 150 MG PO CP24: ORAL_CAPSULE | ORAL | 0 refills | Status: DC

## 2019-05-17 MED ORDER — VENLAFAXINE HCL ER 150 MG PO CP24: ORAL_CAPSULE | ORAL | 0 refills | Status: DC

## 2019-05-17 NOTE — Addendum Note (Signed)
Addended by: Olin Hauser on: 05/17/2019 01:25 PM   Modules accepted: Orders

## 2019-05-20 ENCOUNTER — Telehealth: Payer: Self-pay | Admitting: *Deleted

## 2019-05-29 ENCOUNTER — Other Ambulatory Visit: Payer: Self-pay | Admitting: Nurse Practitioner

## 2019-05-29 DIAGNOSIS — E079 Disorder of thyroid, unspecified: Secondary | ICD-10-CM

## 2019-06-07 ENCOUNTER — Telehealth: Payer: Self-pay | Admitting: Nurse Practitioner

## 2019-06-07 DIAGNOSIS — E079 Disorder of thyroid, unspecified: Secondary | ICD-10-CM

## 2019-06-07 MED ORDER — LEVOTHYROXINE SODIUM 100 MCG PO TABS: ORAL_TABLET | ORAL | 0 refills | Status: DC

## 2019-06-07 NOTE — Telephone Encounter (Signed)
Pt called requesting refill on  Levothyroxine 90 day supply (for Ins to pay)  called into [facility]

## 2019-06-07 NOTE — Telephone Encounter (Signed)
The pt was informed that she needs an appt in order to get anymore refills on her Levothyroxine.  I also informed her that she needs labs. She would like to get her labs drawn on Monday when she comes in the offices.

## 2019-06-07 NOTE — Telephone Encounter (Signed)
90-day sent.  Patient must keep her appt.

## 2019-06-10 ENCOUNTER — Other Ambulatory Visit: Payer: Self-pay

## 2019-06-10 ENCOUNTER — Ambulatory Visit: Payer: Self-pay | Admitting: Nurse Practitioner

## 2019-06-10 ENCOUNTER — Encounter: Payer: Self-pay | Admitting: Anesthesiology

## 2019-06-10 ENCOUNTER — Telehealth: Payer: Self-pay

## 2019-06-10 ENCOUNTER — Ambulatory Visit: Payer: Commercial Managed Care - PPO | Attending: Anesthesiology | Admitting: Anesthesiology

## 2019-06-10 NOTE — Telephone Encounter (Signed)
No refills until labs.  Patient needs labs while on medication, so this is why med was sent.

## 2019-06-10 NOTE — Telephone Encounter (Signed)
The pt N/S her visit on today and called this afternoon to reschedule an appt for tomorrow. She called back about 1 hour later and cancelled the appointment because her Levothyroxine was sent over to her pharmacy.

## 2019-06-11 ENCOUNTER — Ambulatory Visit: Payer: Commercial Managed Care - PPO | Admitting: Nurse Practitioner

## 2019-06-11 NOTE — Progress Notes (Unsigned)
We have been trying to get in touch with Krista Kennedy regarding her care at the pain management center.  We have left 2 messages on her recorder and will await her follow-up prior to releasing her medication renewal.  Dr. Andree Elk

## 2019-06-17 ENCOUNTER — Other Ambulatory Visit: Payer: Self-pay

## 2019-06-17 DIAGNOSIS — Z20822 Contact with and (suspected) exposure to covid-19: Secondary | ICD-10-CM

## 2019-06-18 LAB — NOVEL CORONAVIRUS, NAA: SARS-CoV-2, NAA: NOT DETECTED

## 2019-06-26 ENCOUNTER — Encounter: Payer: Self-pay | Admitting: Nurse Practitioner

## 2019-06-26 ENCOUNTER — Other Ambulatory Visit: Payer: Self-pay

## 2019-06-26 ENCOUNTER — Ambulatory Visit (INDEPENDENT_AMBULATORY_CARE_PROVIDER_SITE_OTHER): Payer: Commercial Managed Care - PPO | Admitting: Nurse Practitioner

## 2019-06-26 DIAGNOSIS — N951 Menopausal and female climacteric states: Secondary | ICD-10-CM

## 2019-06-26 DIAGNOSIS — F1120 Opioid dependence, uncomplicated: Secondary | ICD-10-CM | POA: Diagnosis not present

## 2019-06-26 DIAGNOSIS — F3341 Major depressive disorder, recurrent, in partial remission: Secondary | ICD-10-CM | POA: Diagnosis not present

## 2019-06-26 DIAGNOSIS — F419 Anxiety disorder, unspecified: Secondary | ICD-10-CM

## 2019-06-26 MED ORDER — PROGESTERONE MICRONIZED 100 MG PO CAPS: 100.0000 mg | ORAL_CAPSULE | Freq: Every day | ORAL | 0 refills | Status: DC

## 2019-06-26 MED ORDER — HYDROXYZINE HCL 10 MG PO TABS: 10.0000 mg | ORAL_TABLET | Freq: Three times a day (TID) | ORAL | 0 refills | Status: DC | PRN

## 2019-06-26 MED ORDER — ESTRADIOL 0.025 MG/24HR TD PTWK: 0.0250 mg | MEDICATED_PATCH | TRANSDERMAL | 0 refills | Status: DC

## 2019-06-26 MED ORDER — BUSPIRONE HCL 5 MG PO TABS: 5.0000 mg | ORAL_TABLET | Freq: Two times a day (BID) | ORAL | 1 refills | Status: DC

## 2019-06-26 MED ORDER — VENLAFAXINE HCL ER 150 MG PO CP24: ORAL_CAPSULE | ORAL | 0 refills | Status: DC

## 2019-06-26 NOTE — Progress Notes (Signed)
Telemedicine Encounter: Disclosed to patient at start of encounter that we will provide appropriate telemedicine services.  Patient consents to be treated via phone prior to discussion. - Patient is at her home and is accessed via telephone. - Services are provided by [name] from [facility].  Subjective:    Patient ID: Krista Kennedy, female    DOB: 01-24-1966, 53 y.o.   MRN: [id number]  Krista Kennedy is a 53 y.o. female presenting on 06/26/2019 for Anxiety and Hot Flashes  HPI  Anxiety and Depression Venlafaxine Xr 150 mg once daily. "It is not helping anything right now."  Patient notes it was helping anxiety before current situational stressors.   - Patient is not sleeping, not eating (no appetite, occasional nausea if not eating).  Patient notes possible weight loss, but is not sure and is not having to change clothes sizes right now. - Home stressors.   - Patient is not able to stay at work some days. - " I feel like it is coming apart at the seams."  Cannot calm down, cannot sleep and is exhausted.  Hot flashes - now increased.  Patient states she will be fine then have "flames and sweating suddenly" without predictability.  Patient notes she drenches her clothing, embarrassed by sweating.  This is occurring even on venlafaxine patient is already taking for anxiety.    GAD 7 : Generalized Anxiety Score 06/26/2019  Nervous, Anxious, on Edge 3  Control/stop worrying 3  Worry too much - different things 0  Trouble relaxing 3  Restless 0  Easily annoyed or irritable 0  Afraid - awful might happen 0  Total GAD 7 Score 9  Anxiety Difficulty Very difficult     Depression screen [facility] 2/9 06/26/2019 03/05/2019 11/01/2018 11/01/2018 08/30/2018  Decreased Interest 1 1 0 0 0  Down, Depressed, Hopeless 3 0 0 0 0  PHQ - 2 Score 4 1 0 0 0  Altered sleeping 3 0 - - -  Tired, decreased energy 3 1 - - -  Change in appetite 3 1 - - -  Feeling bad or failure about  yourself  0 0 - - -  Trouble concentrating 1 1 - - -  Moving slowly or fidgety/restless 0 0 - - -  Suicidal thoughts 0 0 - - -  PHQ-9 Score 14 4 - - -  Difficult doing work/chores Somewhat difficult Not difficult at all - - -  Some recent data might be hidden     Social History   Tobacco Use  . Smoking status: Former Smoker    Packs/day: 0.25    Years: 15.00    Pack years: 3.75    Types: Cigarettes  . Smokeless tobacco: Former Network engineer Use Topics  . Alcohol use: No  . Drug use: No    Review of Systems Per HPI unless specifically indicated above     Objective:    There were no vitals taken for this visit.  Wt Readings from Last 3 Encounters:  11/01/18 171 lb 12.8 oz (77.9 kg)  11/01/18 172 lb (78 kg)  10/23/18 172 lb 12.8 oz (78.4 kg)    Physical Exam Patient remotely monitored.  Verbal communication appropriate.  Cognition normal.   Results for orders placed or performed in visit on 06/17/19  Novel Coronavirus, NAA ([facility])   Specimen: Oropharyngeal(OP) collection in vial transport medium   OROPHARYNGEA  TESTING  Result Value Ref Range   SARS-CoV-2, NAA Not Detected Not  Detected      Assessment & Plan:   Problem List Items Addressed This Visit      Other   Opioid dependence, daily use [facility]) Patient currently managed by pain management for opioid use. Stable. Continue follow-up with pain clinic.    Recurrent major depressive disorder, in partial remission (HCC)   Relevant Medications   hydrOXYzine (ATARAX/VISTARIL) 10 MG tablet   busPIRone (BUSPAR) 5 MG tablet   venlafaxine XR (EFFEXOR-XR) 150 MG 24 hr capsule    Other Visit Diagnoses    Anxiety    -  Primary   Relevant Medications   hydrOXYzine (ATARAX/VISTARIL) 10 MG tablet   busPIRone (BUSPAR) 5 MG tablet   venlafaxine XR (EFFEXOR-XR) 150 MG 24 hr capsule   Menopausal vasomotor syndrome       Relevant Medications   progesterone (PROMETRIUM) 100 MG capsule   estradiol (CLIMARA - DOSED IN  MG/24 HR) 0.025 mg/24hr patch      Anxiety and depression Currently uncontrolled on venlafaxine 150 mg daily.  Situational worsening with increased home stress is primary trigger for worsening.  Plan: 1. START hydroxyzine 10-20 mg tid prn anxiety/sleep. 2. Continue venlafaxine XR 150 mg daily 3. START buspirone 5 mg bid (take one tab for first 7 days to reduce side effects). 4. Recommended counseling. Patient declines today. 5. Consider referral to psychiatry in future.  6. Follow-up 6-8 weeks in clinic.   Menopausal Vasomotor Syndrome Currently uncontrolled.  Venlafaxine not helping currently as patient is taking this.    Plan: 1. Start HRT for short course (3-6 months) until anxiety/depression is improving and we can consider alternative therapy. - Reviewed increased cardiovascular event risk. - Will need to transfer to OBGYN care if extended course needed. 2. START estradiol patch 0.025 mg / 24 hours, increase if needed for symptoms 3. START progesterone 100 mg daily  4. Follow-up 2-3 months.    Meds ordered this encounter  Medications  . hydrOXYzine (ATARAX/VISTARIL) 10 MG tablet    Sig: Take 1-2 tablets (10-20 mg total) by mouth 3 (three) times daily as needed for anxiety.    Dispense:  30 tablet    Refill:  0    Order Specific Question:   Supervising Provider    Answer:   Olin Hauser [[id number]]  . busPIRone (BUSPAR) 5 MG tablet    Sig: Take 1 tablet (5 mg total) by mouth 2 (two) times daily. Start for 7 days with one tablet once daily.    Dispense:  60 tablet    Refill:  1    Order Specific Question:   Supervising Provider    Answer:   Olin Hauser [[id number]]  . venlafaxine XR (EFFEXOR-XR) 150 MG 24 hr capsule    Sig: TAKE 1 CAPSULE(150 MG) BY MOUTH DAILY WITH BREAKFAST    Dispense:  90 capsule    Refill:  0    Order Specific Question:   Supervising Provider    Answer:   Olin Hauser [[id number]]  . progesterone (PROMETRIUM) 100 MG capsule     Sig: Take 1 capsule (100 mg total) by mouth daily.    Dispense:  90 capsule    Refill:  0    Order Specific Question:   Supervising Provider    Answer:   Olin Hauser [[id number]]  . estradiol (CLIMARA - DOSED IN MG/24 HR) 0.025 mg/24hr patch    Sig: Place 1 patch (0.025 mg total) onto the skin once a week.    Dispense:  12 patch    Refill:  0    Order Specific Question:   Supervising Provider    Answer:   Olin Hauser [[id number]]   - Time spent in direct consultation with patient via telemedicine about above concerns: 13 minutes  Follow up plan: 2-3 months in clinic for med adjust, consideration of stopping HRT  [name], DNP, AGPCNP-BC Adult Gerontology Primary Care Nurse Practitioner [facility] 06/26/2019, 10:10 AM

## 2019-07-10 ENCOUNTER — Other Ambulatory Visit: Payer: Self-pay | Admitting: Family Medicine

## 2019-07-10 DIAGNOSIS — I10 Essential (primary) hypertension: Secondary | ICD-10-CM

## 2019-08-04 IMAGING — CR DG SHOULDER 2+V*L*
1 series · 3 of 3 positions shown · non-contrast
Comparison: None.

CLINICAL DATA: Left shoulder pain, no injury

EXAM:
LEFT SHOULDER - 2+ VIEW

[Series 1: dg shoulder left · 0.14mm/px · 3 of 3 slices shown]
[im 1/3]
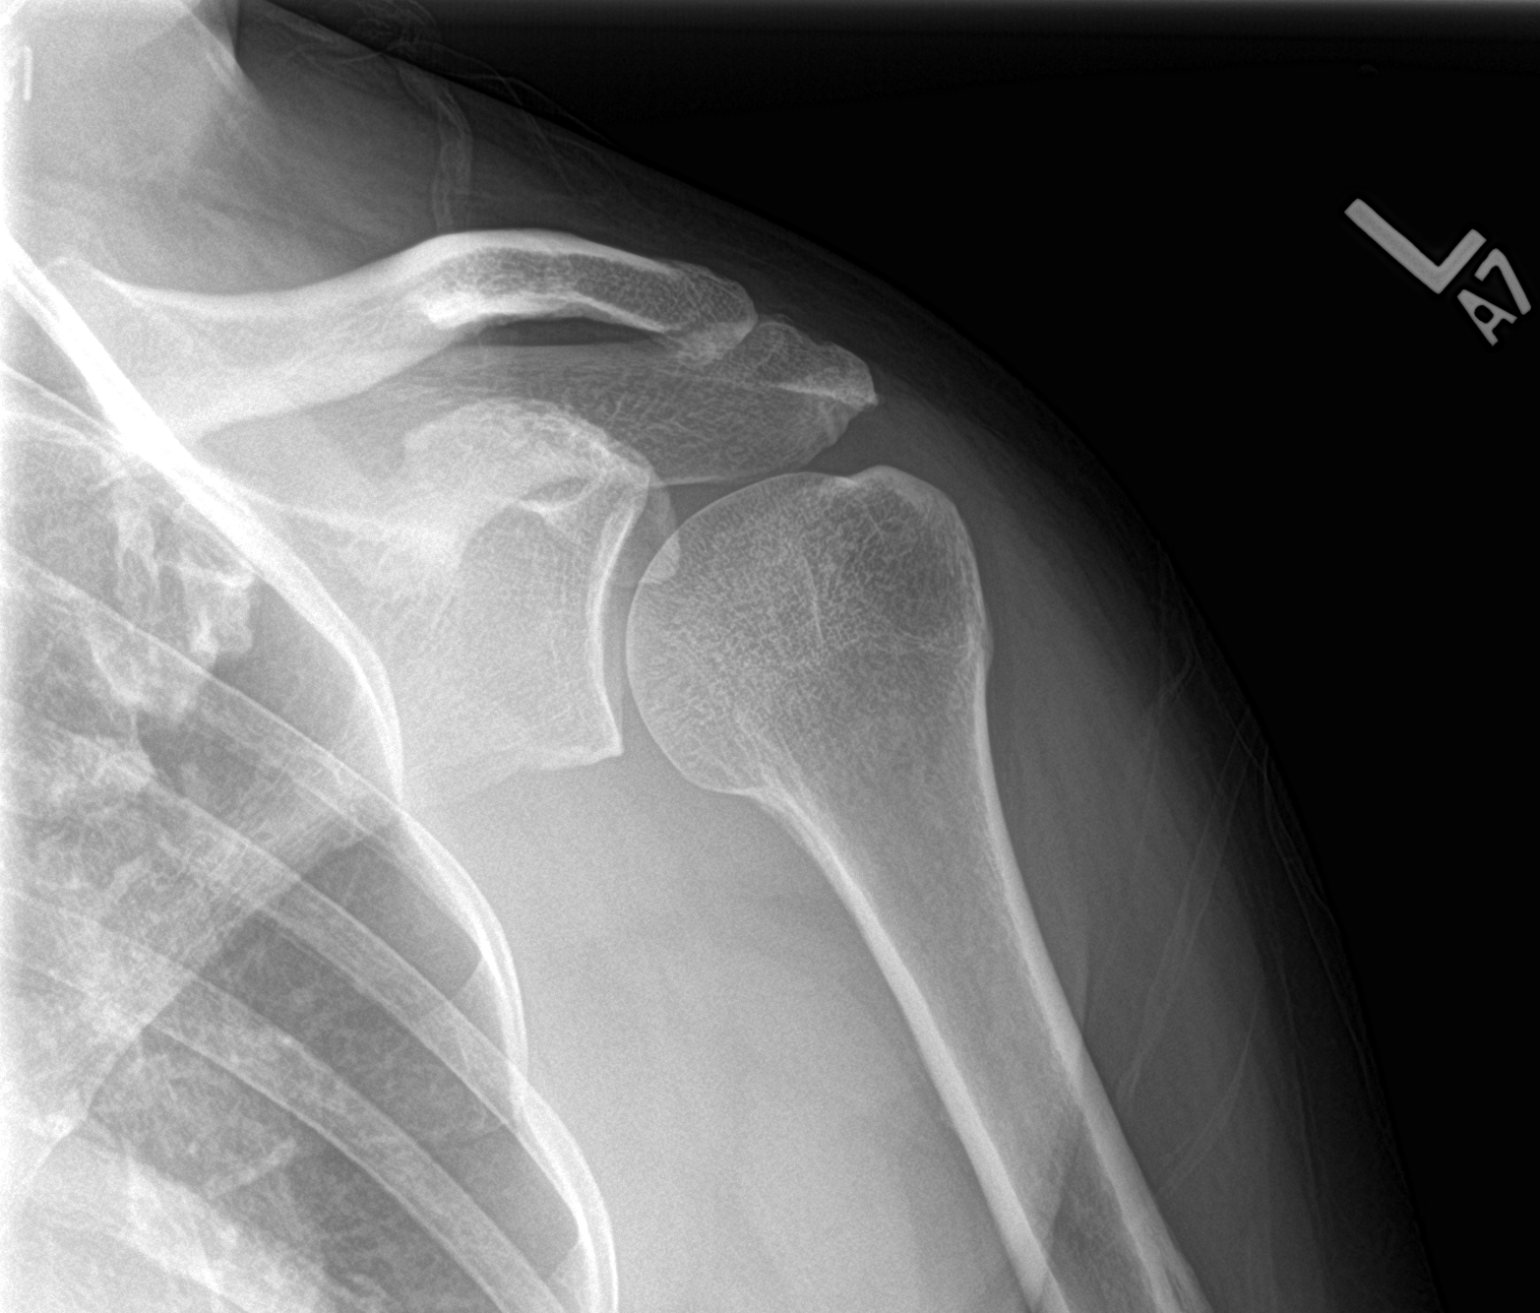
[im 2/3]
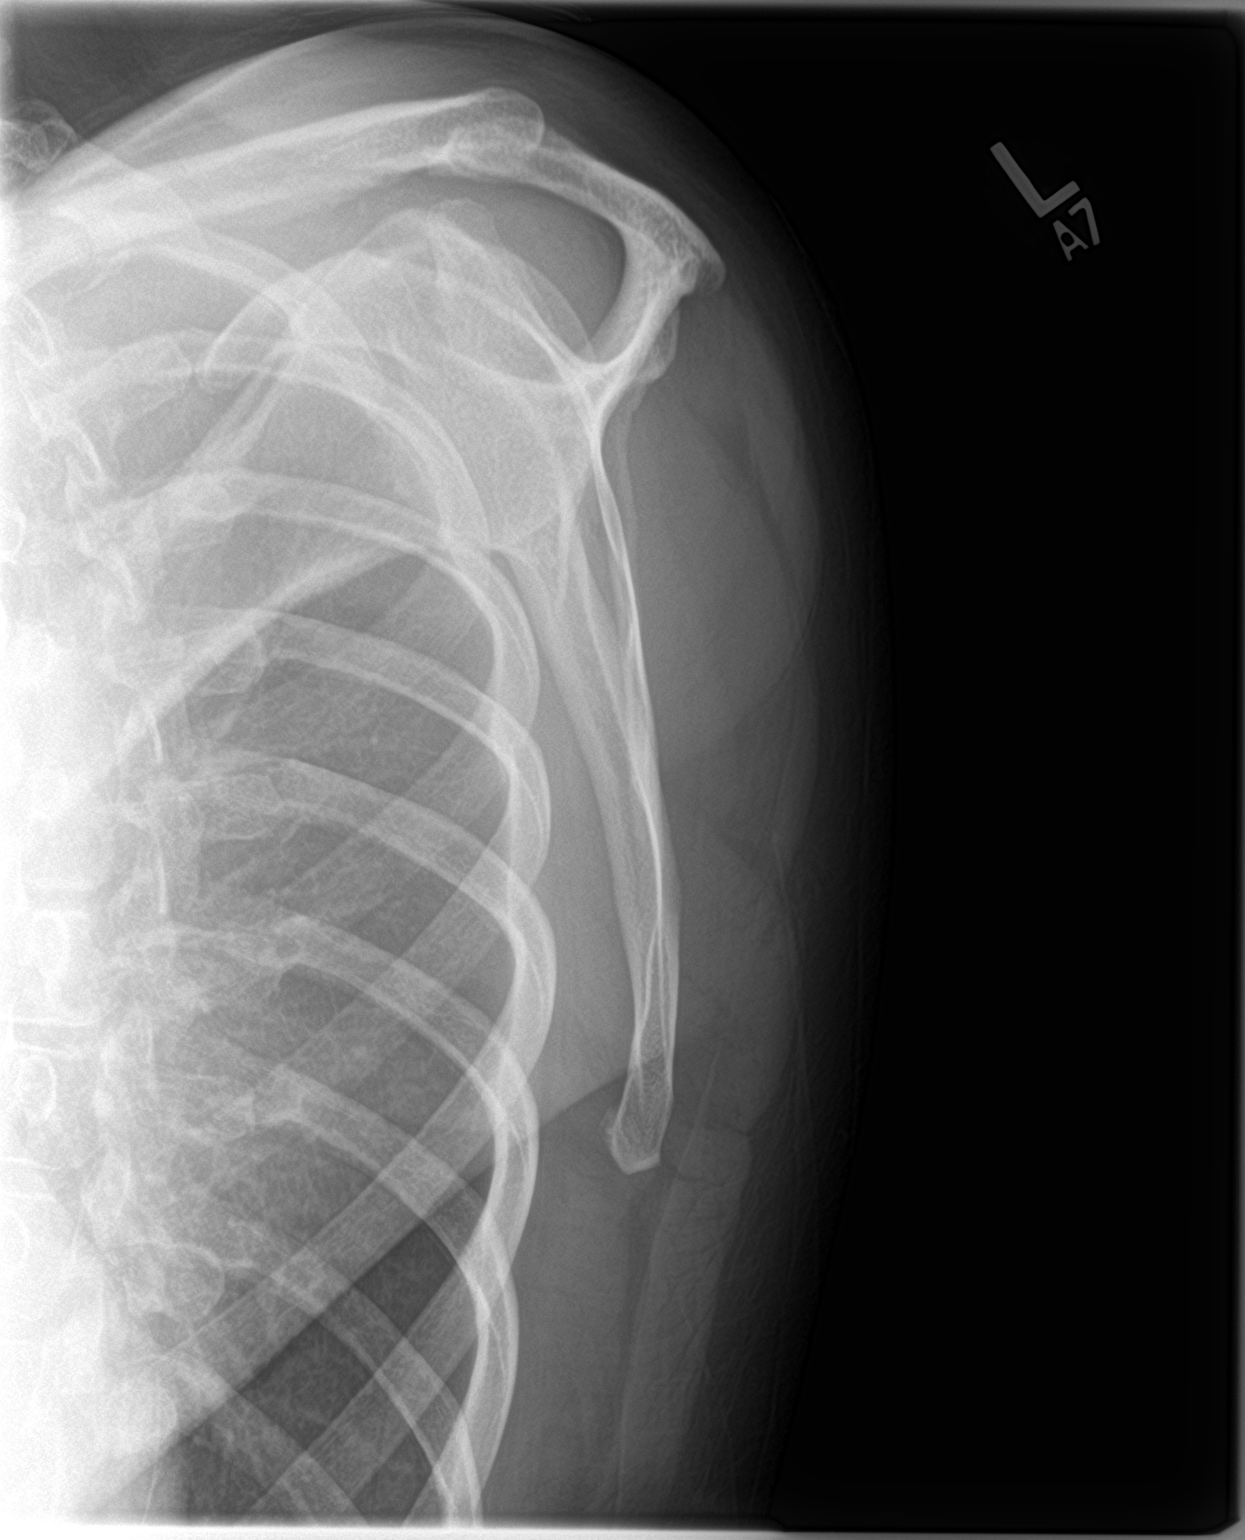
[im 3/3]
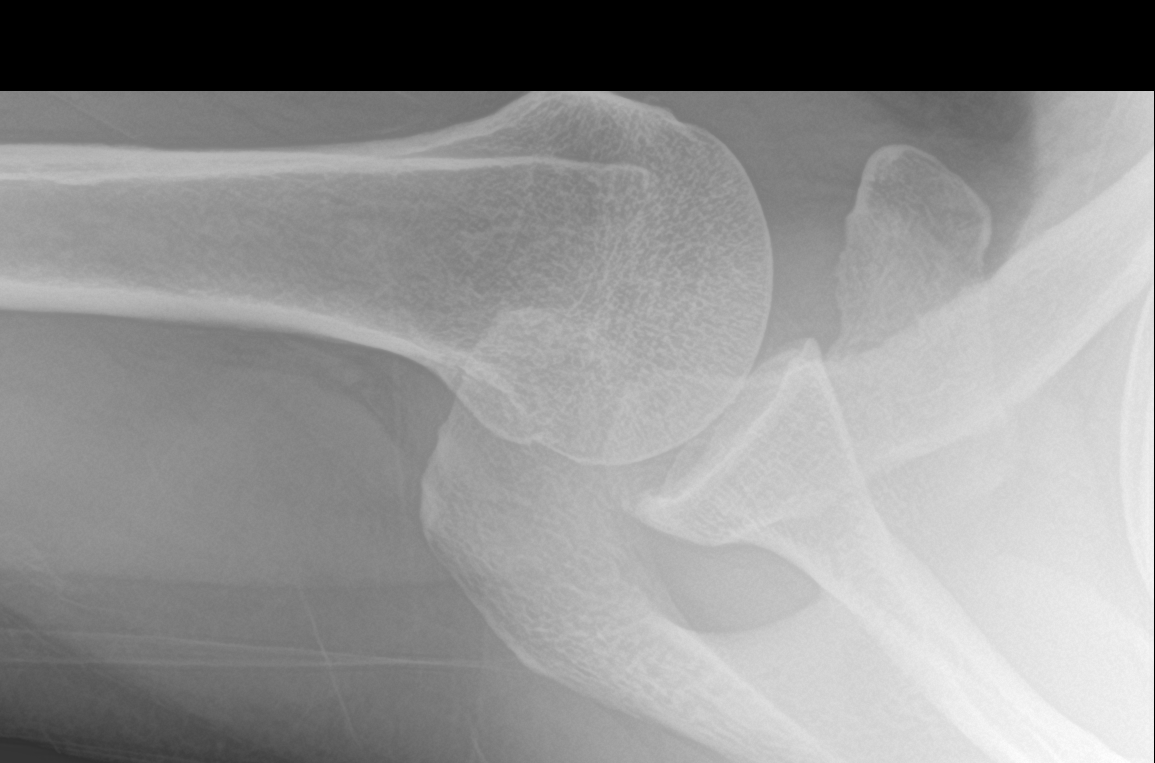

[3 of 3 positions shown; findings below may reference images not displayed]

FINDINGS: The left humeral head is in normal position and the left
glenohumeral joint space appears normal. The left AC joint is
normally aligned. No significant abnormality is seen.
IMPRESSION: Negative.

## 2019-08-06 ENCOUNTER — Telehealth: Payer: Self-pay | Admitting: Anesthesiology

## 2019-08-06 NOTE — Telephone Encounter (Signed)
Thank you, patient is scheduled 08-13-19

## 2019-08-06 NOTE — Telephone Encounter (Signed)
Patient had started seeing a different pain clinic and is not satisfied there. Wants to come back and see Dr. Andree Elk. Please check with him to see if this is ok. She wants to make an appt.

## 2019-08-06 NOTE — Telephone Encounter (Signed)
I talked with Dr Andree Elk and he said that he would see her again. Go ahead and make her an appointment.

## 2019-08-13 ENCOUNTER — Other Ambulatory Visit: Payer: Self-pay

## 2019-08-13 ENCOUNTER — Ambulatory Visit: Payer: Commercial Managed Care - PPO | Attending: Anesthesiology | Admitting: Anesthesiology

## 2019-08-13 ENCOUNTER — Encounter: Payer: Self-pay | Admitting: Anesthesiology

## 2019-08-13 DIAGNOSIS — R29898 Other symptoms and signs involving the musculoskeletal system: Secondary | ICD-10-CM

## 2019-08-13 DIAGNOSIS — M533 Sacrococcygeal disorders, not elsewhere classified: Secondary | ICD-10-CM

## 2019-08-13 DIAGNOSIS — M5441 Lumbago with sciatica, right side: Secondary | ICD-10-CM

## 2019-08-13 DIAGNOSIS — F1190 Opioid use, unspecified, uncomplicated: Secondary | ICD-10-CM

## 2019-08-13 DIAGNOSIS — M5442 Lumbago with sciatica, left side: Secondary | ICD-10-CM

## 2019-08-13 DIAGNOSIS — M25512 Pain in left shoulder: Secondary | ICD-10-CM

## 2019-08-13 DIAGNOSIS — L4050 Arthropathic psoriasis, unspecified: Secondary | ICD-10-CM

## 2019-08-13 DIAGNOSIS — G894 Chronic pain syndrome: Secondary | ICD-10-CM

## 2019-08-13 DIAGNOSIS — M25511 Pain in right shoulder: Secondary | ICD-10-CM

## 2019-08-13 DIAGNOSIS — M25549 Pain in joints of unspecified hand: Secondary | ICD-10-CM | POA: Diagnosis not present

## 2019-08-13 DIAGNOSIS — M25532 Pain in left wrist: Secondary | ICD-10-CM

## 2019-08-13 DIAGNOSIS — G8929 Other chronic pain: Secondary | ICD-10-CM

## 2019-08-13 DIAGNOSIS — M25552 Pain in left hip: Secondary | ICD-10-CM

## 2019-08-13 HISTORY — DX: Other symptoms and signs involving the musculoskeletal system: R29.898

## 2019-08-13 MED ORDER — OXYCODONE-ACETAMINOPHEN 10-325 MG PO TABS: 1.0000 | ORAL_TABLET | Freq: Four times a day (QID) | ORAL | 0 refills | Status: DC | PRN

## 2019-08-13 NOTE — Progress Notes (Signed)
Virtual Visit via Video Note  I connected with Krista Kennedy on 08/13/19 at 11:45 AM EST by a video enabled telemedicine application and verified that I am speaking with the correct person using two identifiers.  Location: Patient: Home Provider: [facility] control center   I discussed the limitations of evaluation and management by telemedicine and the availability of in person appointments. The patient expressed understanding and agreed to proceed.  History of Present Illness: I spoke with Krista Kennedy via video voice conferencing for her appointment today secondary to the Covid crisis.  She was a bit tearful during our evaluation.  She continues to have chronic lower back pain but is having more severe radiation into the bilateral calves but worse on the left side.  She is also reporting some give way weakness on the left side which is transient in nature.  Her bowel and bladder function have been stable.  In the past she has been on Percocet with good success and this is helped keep her psoriatic arthritic symptoms as well as her low back pain under reasonable control.  At this point the pain has become more substantial and she is concerned about the severity and the fact that she cannot get relief and is not sleeping well at night.  Otherwise her psoriatic arthritic symptoms have been stable in nature and generally well controlled with the Percocet 10/325 that she takes 4 times a day.  She has had recent lumbar spine films showing evidence of degenerative disc disease but she has not had an MRI for further evaluation.    Observations/Objective:   Current Outpatient Medications:  .  Ascorbic Acid (VITAMIN C) 1000 MG tablet, Take 1,000 mg by mouth daily., Disp: , Rfl:  .  baclofen (LIORESAL) 10 MG tablet, Take 1 tablet (10 mg total) by mouth 2 (two) times daily as needed (muscle tension 2/2 migraine)., Disp: 30 each, Rfl: 3 .  betamethasone dipropionate (DIPROLENE) 0.05 % ointment, daily. , Disp: ,  Rfl: 0 .  busPIRone (BUSPAR) 5 MG tablet, Take 1 tablet (5 mg total) by mouth 2 (two) times daily. Start for 7 days with one tablet once daily., Disp: 60 tablet, Rfl: 1 .  carvedilol (COREG) 25 MG tablet, Take 1 tablet (25 mg total) by mouth 2 (two) times daily with a meal., Disp: 180 tablet, Rfl: 0 .  Cholecalciferol (VITAMIN D-1000 MAX ST) 1000 UNITS tablet, Take by mouth daily. , Disp: , Rfl:  .  clobetasol (TEMOVATE) 0.05 % external solution, Apply 1 application topically as needed. , Disp: , Rfl:  .  estradiol (CLIMARA - DOSED IN MG/24 HR) 0.025 mg/24hr patch, Place 1 patch (0.025 mg total) onto the skin once a week., Disp: 12 patch, Rfl: 0 .  fluocinonide (LIDEX) 0.05 % external solution, Apply 1 application topically daily. , Disp: , Rfl: 0 .  fluticasone (FLONASE) 50 MCG/ACT nasal spray, Place 2 sprays into both nostrils daily., Disp: 16 g, Rfl: 3 .  hydrOXYzine (ATARAX/VISTARIL) 10 MG tablet, Take 1-2 tablets (10-20 mg total) by mouth 3 (three) times daily as needed for anxiety., Disp: 30 tablet, Rfl: 0 .  ipratropium (ATROVENT) 0.06 % nasal spray, Place 2 sprays into both nostrils 4 (four) times daily. For up to 5-7 days then stop. (Patient taking differently: Place 2 sprays into both nostrils as needed. For up to 5-7 days then stop.), Disp: 15 mL, Rfl: 0 .  Ixekizumab (TALTZ) 80 MG/ML SOSY, Inject 80 mg into the skin as directed., Disp: ,  Rfl:  .  levothyroxine (SYNTHROID) 100 MCG tablet, TAKE 1 TABLET(100 MCG) BY MOUTH DAILY *NEED APPT with LABS*LAST REFILL*, Disp: 90 tablet, Rfl: 0 .  lisinopril (ZESTRIL) 20 MG tablet, Take 1 tablet (20 mg total) by mouth daily., Disp: 90 tablet, Rfl: 0 .  metFORMIN (GLUCOPHAGE) 500 MG tablet, Take 1 tablet (500 mg total) by mouth 2 (two) times daily with a meal., Disp: 180 tablet, Rfl: 3 .  naloxone (NARCAN) nasal spray 4 mg/0.1 mL, To reverse excess sedation from narcotics, Disp: 2 kit, Rfl: 1 .  omeprazole (PRILOSEC) 20 MG capsule, Take 1 capsule (20  mg total) by mouth daily. (Patient not taking: Reported on 06/26/2019), Disp: 30 capsule, Rfl: 2 .  ondansetron (ZOFRAN) 4 MG tablet, Take 1 tablet (4 mg total) by mouth every 8 (eight) hours as needed for nausea or vomiting. (Patient not taking: Reported on 03/05/2019), Disp: 20 tablet, Rfl: 0 .  oxyCODONE-acetaminophen (PERCOCET) 10-325 MG tablet, Take 1 tablet by mouth every 6 (six) hours as needed for pain., Disp: 120 tablet, Rfl: 0 .  progesterone (PROMETRIUM) 100 MG capsule, Take 1 capsule (100 mg total) by mouth daily., Disp: 90 capsule, Rfl: 0 .  SUMAtriptan (IMITREX) 5 MG/ACT nasal spray, PLACE 1 SPRAY INTO THE NOSE EVERY 2 HOURS AS NEEDED FOR MIGRAINE, Disp: 1 Inhaler, Rfl: 1 .  venlafaxine XR (EFFEXOR-XR) 150 MG 24 hr capsule, TAKE 1 CAPSULE(150 MG) BY MOUTH DAILY WITH BREAKFAST, Disp: 90 capsule, Rfl: 0 Assessment and Plan:  1. Psoriasis with arthropathy ([place])   2. Chronic pain of both shoulders   3. Pain in multiple finger joints   4. Chronic bilateral low back pain with bilateral sciatica   5. Chronic pain syndrome   6. Chronic left hip pain   7. Left wrist pain   8. Chronic sacroiliac joint pain   9. Chronic, continuous use of opioids   10. Psoriatic arthritis ([place])   11. Transient weakness of left leg   Based on our discussion today and upon review of the [facility] practitioner database information going to refill her medicine for Percocet 10/325 for 1 tablet 4 times a day.  She has been seeing Dr. Damita Dunnings recently and would like to transfer back to our care.  I will assist with that.  Furthermore I am going to set her up for an epidural steroid injection to see if this interventional therapy can be of benefit for her.  We gone over the risks and benefits of the procedure with her in full detail today and she desires to do this at the earliest date.  I want her to continue with her stretching strengthening exercises and her other medications for management of her other pain  symptoms and we will schedule her epidural and return to clinic within the next 2 to 3 weeks.  Secondary to the left transient leg weakness and persistent low back pain I am sending her for an MRI for further evaluation of her low back pathology. Follow Up Instructions:    I discussed the assessment and treatment plan with the patient. The patient was provided an opportunity to ask questions and all were answered. The patient agreed with the plan and demonstrated an understanding of the instructions.   The patient was advised to call back or seek an in-person evaluation if the symptoms worsen or if the condition fails to improve as anticipated.  I provided 30 minutes of non-face-to-face time during this encounter.   Molli Barrows, MD

## 2019-08-14 ENCOUNTER — Telehealth: Payer: Self-pay | Admitting: *Deleted

## 2019-08-26 ENCOUNTER — Other Ambulatory Visit: Payer: Self-pay | Admitting: Nurse Practitioner

## 2019-08-26 ENCOUNTER — Ambulatory Visit: Admission: RE | Admit: 2019-08-26 | Payer: Commercial Managed Care - PPO | Source: Ambulatory Visit

## 2019-08-26 DIAGNOSIS — I10 Essential (primary) hypertension: Secondary | ICD-10-CM

## 2019-08-26 MED ORDER — LISINOPRIL 20 MG PO TABS: 20.0000 mg | ORAL_TABLET | Freq: Every day | ORAL | 0 refills | Status: DC

## 2019-08-26 NOTE — Telephone Encounter (Signed)
Pt called requesting refill on lisinopril  90 day  Supply called into [facility]. Pt  Call back  # is  [phone]

## 2019-08-28 ENCOUNTER — Ambulatory Visit: Payer: Commercial Managed Care - PPO | Admitting: Anesthesiology

## 2019-09-10 ENCOUNTER — Telehealth: Payer: Self-pay | Admitting: Cardiovascular Disease

## 2019-09-10 NOTE — Telephone Encounter (Signed)
Spoke with patient regarding her blood pressures and she states that she is not going to work today or tomorrow until she sees provider here int he office. She is taking carvedilol 25 mg twice a day and lisinopril 20 mg once daily. She states that she is not on any other medications for this. Confirmed appointment for Thursday and reviewed signs and symptoms to monitor for that would require immediate evaluation in ED. She verbalized understanding of our conversation, agreement with appointment and no other questions at this time.

## 2019-09-10 NOTE — Telephone Encounter (Signed)
Pt c/o BP issue: STAT if pt c/o blurred vision, one-sided weakness or slurred speech  1. What are your last 5 BP readings? 190/118       181/101   2. Are you having any other symptoms (ex. Dizziness, headache, blurred vision, passed out)? SOB  3. What is your BP issue? htn wants asap appt.  Scheduled 1/7 with ryan 11

## 2019-09-11 NOTE — Progress Notes (Signed)
Cardiology Office Note    Date:  09/12/2019   ID:  [name], DOB Jan 31, 1966, MRN [id number]  PCP:  Mikey College, NP (Inactive)  Cardiologist:  Ida Rogue, MD  Electrophysiologist:  None   Chief Complaint: Elevated BP  History of Present Illness:   Krista Kennedy is a 54 y.o. female with history of peripartum cardiomyopathy in 2002 with prior EF of 25% improved to 55% in 2014, HTN, hypothyroidism, prior tobacco use, chronic pain syndrome, and GERD who presents for evaluation of elevated BP.   She has a history of peripartum cardiomyopathy in 2002 with associated lower extremity swelling and SOB. Notes indicate it took some time for her EF to improve. Most recent echo from 05/2013 showed 55%, no RWMA, Gr1DD, RVSF normal, PASP normal. She has previously noted she has not felt well since her original diagnosis in 2002. She was seen by Dr. Rockey Situ in 08/2018 for evaluation of her history of cardiomyopathy and was overall doing well, noting some palpitations. BP was 150/90. She was advised to increase lisinopril to 20 mg daily. With regards to her palpitations, there was mention of an extra Coreg or prn propranolol.   She contacted out office on 09/10/2019 noting BP readings of 181/101 and 190/118. She was taking Coreg 25 mg bid and lisinopril 20 mg daily. In this setting, she was scheduled for today.   Since the above BP readings, she has continued to note elevations in her blood pressure ranging in the 0000000 to 123456 systolic.  She has been compliant with carvedilol and lisinopril and denies any recent increased stressors or changes in her diet.  She did note some increased shortness of breath when her blood pressures were in the [id number] to A999333 systolic which has subsequently improved with slight improvement in her blood pressure.  She has noted BP in the 0000000 systolic first thing in the morning after resting all night.  She denies any snoring or apneic episodes.  No lower extremity  swelling, abdominal distention, orthopnea, PND, or early satiety.  No chest pain.  Her weight has remained stable.   Labs independently reviewed: 07/2018 - TSH low at 0.03, TC 194, TG 128, HDL 41, LDL 129, A1c 6.7, HGB 13.6, PLT 241, K+ 3.6, BUN 13, SCr 0.93, albumin 4.0, AST/ALT normal  Past Medical History:  Diagnosis Date   Arthritis    Cardiomyopathy ([place])    CHF (congestive heart failure) (HCC)    Hyperlipidemia    Hypertension    Psoriasis    Thyroid disease    Transient weakness of left leg 08/13/2019    Past Surgical History:  Procedure Laterality Date   ENDOMETRIAL ABLATION  2015   THYROIDECTOMY     TONSILLECTOMY     TUBAL LIGATION      Current Medications: Current Meds  Medication Sig   Ascorbic Acid (VITAMIN C) 1000 MG tablet Take 1,000 mg by mouth daily.   betamethasone dipropionate (DIPROLENE) 0.05 % ointment daily.    carvedilol (COREG) 25 MG tablet Take 1 tablet (25 mg total) by mouth 2 (two) times daily with a meal.   Cholecalciferol (VITAMIN D-1000 MAX ST) 1000 UNITS tablet Take by mouth daily.    clobetasol (TEMOVATE) 0.05 % external solution Apply 1 application topically as needed.    estradiol (CLIMARA - DOSED IN MG/24 HR) 0.025 mg/24hr patch Place 1 patch (0.025 mg total) onto the skin once a week.   fluocinonide (LIDEX) 0.05 % external solution Apply 1 application topically daily.  fluticasone (FLONASE) 50 MCG/ACT nasal spray Place 2 sprays into both nostrils daily.   ipratropium (ATROVENT) 0.06 % nasal spray Place 2 sprays into both nostrils 4 (four) times daily. For up to 5-7 days then stop. (Patient taking differently: Place 2 sprays into both nostrils as needed. For up to 5-7 days then stop.)   Ixekizumab (TALTZ) 80 MG/ML SOSY Inject 80 mg into the skin as directed.   levothyroxine (SYNTHROID) 100 MCG tablet TAKE 1 TABLET(100 MCG) BY MOUTH DAILY *NEED APPT with LABS*LAST REFILL*   lisinopril (ZESTRIL) 20 MG tablet Take 1  tablet (20 mg total) by mouth daily.   metFORMIN (GLUCOPHAGE) 500 MG tablet Take 1 tablet (500 mg total) by mouth 2 (two) times daily with a meal.   naloxone (NARCAN) nasal spray 4 mg/0.1 mL To reverse excess sedation from narcotics   omeprazole (PRILOSEC) 20 MG capsule Take 1 capsule (20 mg total) by mouth daily.   ondansetron (ZOFRAN) 4 MG tablet Take 1 tablet (4 mg total) by mouth every 8 (eight) hours as needed for nausea or vomiting.   oxyCODONE-acetaminophen (PERCOCET) 10-325 MG tablet Take 1 tablet by mouth every 6 (six) hours as needed for pain.   progesterone (PROMETRIUM) 100 MG capsule Take 1 capsule (100 mg total) by mouth daily.   venlafaxine XR (EFFEXOR-XR) 150 MG 24 hr capsule TAKE 1 CAPSULE(150 MG) BY MOUTH DAILY WITH BREAKFAST    Allergies:   Other, Sulfa antibiotics, and Sulfacetamide sodium   Social History   Socioeconomic History   Marital status: Married    Spouse name: Not on file   Number of children: Not on file   Years of education: Not on file   Highest education level: Not on file  Occupational History   Not on file  Tobacco Use   Smoking status: Former Smoker    Packs/day: 0.25    Years: 15.00    Pack years: 3.75    Types: Cigarettes   Smokeless tobacco: Former Network engineer and Sexual Activity   Alcohol use: No   Drug use: No   Sexual activity: Yes  Other Topics Concern   Not on file  Social History Narrative   Not on file   Social Determinants of Health   Financial Resource Strain:    Difficulty of Paying Living Expenses: Not on file  Food Insecurity:    Worried About Charity fundraiser in the Last Year: Not on file   [employer] of Food in the Last Year: Not on file  Transportation Needs:    Lack of Transportation (Medical): Not on file   Lack of Transportation (Non-Medical): Not on file  Physical Activity:    Days of Exercise per Week: Not on file   Minutes of Exercise per Session: Not on file  Stress:     Feeling of Stress : Not on file  Social Connections:    Frequency of Communication with Friends and Family: Not on file   Frequency of Social Gatherings with Friends and Family: Not on file   Attends Religious Services: Not on file   Active Member of Clubs or Organizations: Not on file   Attends Archivist Meetings: Not on file   Marital Status: Not on file     Family History:  The patient's family history includes Heart disease in her father; Hyperlipidemia in her father and mother; Hypertension in her father and mother.  ROS:   Review of Systems  Constitutional: Positive for malaise/fatigue. Negative for  chills, diaphoresis, fever and weight loss.  HENT: Negative for congestion.   Eyes: Negative for discharge and redness.  Respiratory: Positive for shortness of breath. Negative for cough, sputum production and wheezing.   Cardiovascular: Negative for chest pain, palpitations, orthopnea, claudication, leg swelling and PND.  Gastrointestinal: Negative for abdominal pain, heartburn, nausea and vomiting.  Musculoskeletal: Negative for falls and myalgias.  Skin: Negative for rash.  Neurological: Negative for dizziness, tingling, tremors, sensory change, speech change, focal weakness, loss of consciousness and weakness.  Endo/Heme/Allergies: Does not bruise/bleed easily.  Psychiatric/Behavioral: Negative for substance abuse. The patient is not nervous/anxious.   All other systems reviewed and are negative.    EKGs/Labs/Other Studies Reviewed:    Studies reviewed were summarized above. The additional studies were reviewed today: As above.   EKG:  EKG is ordered today.  The EKG ordered today demonstrates NSR, 84 bpm, right atrial enlargement, LVH, no acute ST-T change  Recent Labs: No results found for requested labs within last 8760 hours.  Recent Lipid Panel    Component Value Date/Time   CHOL 194 07/20/2018 0959   TRIG 128 07/20/2018 0959   HDL 41 (L)  07/20/2018 0959   CHOLHDL 4.7 07/20/2018 0959   VLDL 20 04/11/2017 1103   LDLCALC 129 (H) 07/20/2018 0959    PHYSICAL EXAM:    VS:  BP (!) 144/80 (BP Location: Left Arm, Patient Position: Sitting, Cuff Size: Normal)    Pulse 84    Ht 5\' 10"  (1.778 m)    Wt 169 lb 4 oz (76.8 kg)    SpO2 98%    BMI 24.28 kg/m   BMI: Body mass index is 24.28 kg/m.  Physical Exam  Constitutional: She is oriented to person, place, and time. She appears well-developed and well-nourished.  HENT:  Head: Normocephalic and atraumatic.  Eyes: Right eye exhibits no discharge. Left eye exhibits no discharge.  Neck: No JVD present.  Cardiovascular: Normal rate, regular rhythm, S1 normal, S2 normal and normal heart sounds. Exam reveals no distant heart sounds, no friction rub, no midsystolic click and no opening snap.  No murmur heard. Pulses:      Posterior tibial pulses are 2+ on the right side and 2+ on the left side.  Pulmonary/Chest: Effort normal and breath sounds normal. No respiratory distress. She has no decreased breath sounds. She has no wheezes. She has no rales. She exhibits no tenderness.  Abdominal: Soft. She exhibits no distension. There is no abdominal tenderness.  Musculoskeletal:        General: No edema.     Cervical back: Normal range of motion.  Neurological: She is alert and oriented to person, place, and time.  Skin: Skin is warm and dry. No cyanosis. Nails show no clubbing.  Psychiatric: She has a normal mood and affect. Her speech is normal and behavior is normal. Judgment and thought content normal.    Wt Readings from Last 3 Encounters:  09/12/19 169 lb 4 oz (76.8 kg)  11/01/18 171 lb 12.8 oz (77.9 kg)  11/01/18 172 lb (78 kg)     ASSESSMENT & PLAN:   1. History of peripartum cardiomyopathy/dyspnea: She appears euvolemic and well compensated.  She did note some dyspnea with significantly elevated blood pressure.  With this we will update an echo to evaluate for any structural  abnormalities.  Continue carvedilol and lisinopril with the addition of spironolactone as outlined below.  2. Hypertension: Blood pressure remains suboptimally controlled with reading of 144/80 in the office  today.  I would like to see her blood pressure consistently less than 0000000 systolic prior to her resuming work.  Out of work note was supplied today.  Add spironolactone 25 mg daily.  Check CMP, CBC, and TSH today.  She will come back to the [place] 1 week after starting spironolactone for recheck BMP.  She denies any symptoms concerning for sleep disordered breathing.  She does not drink alcohol.  Continue current dose carvedilol and lisinopril.  In follow-up, could consider titration of lisinopril to 40 mg daily.  3. Hypothyroidism: Most recent TSH abnormal as outlined above.  Check TSH.  Follow-up with PCP as directed.  Remains on replacement therapy.  Disposition: F/u with Dr. Rockey Situ or an APP in 1 month.   Medication Adjustments/Labs and Tests Ordered: Current medicines are reviewed at length with the patient today.  Concerns regarding medicines are outlined above. Medication changes, Labs and Tests ordered today are summarized above and listed in the Patient Instructions accessible in Encounters.   Signed, Christell Faith, PA-C 09/12/2019 11:19 AM     [place] [address] [place], [place] [address] [phone]

## 2019-09-12 ENCOUNTER — Other Ambulatory Visit: Payer: Self-pay

## 2019-09-12 ENCOUNTER — Ambulatory Visit (INDEPENDENT_AMBULATORY_CARE_PROVIDER_SITE_OTHER): Payer: Commercial Managed Care - PPO | Admitting: Physician Assistant

## 2019-09-12 ENCOUNTER — Ambulatory Visit: Payer: Commercial Managed Care - PPO | Attending: Anesthesiology | Admitting: Anesthesiology

## 2019-09-12 ENCOUNTER — Encounter: Payer: Self-pay | Admitting: Physician Assistant

## 2019-09-12 ENCOUNTER — Encounter: Payer: Self-pay | Admitting: Anesthesiology

## 2019-09-12 VITALS — BP 144/80 | HR 84 | Ht 70.0 in | Wt 169.2 lb

## 2019-09-12 DIAGNOSIS — M533 Sacrococcygeal disorders, not elsewhere classified: Secondary | ICD-10-CM

## 2019-09-12 DIAGNOSIS — Z8679 Personal history of other diseases of the circulatory system: Secondary | ICD-10-CM

## 2019-09-12 DIAGNOSIS — R0602 Shortness of breath: Secondary | ICD-10-CM

## 2019-09-12 DIAGNOSIS — L4050 Arthropathic psoriasis, unspecified: Secondary | ICD-10-CM | POA: Diagnosis not present

## 2019-09-12 DIAGNOSIS — E039 Hypothyroidism, unspecified: Secondary | ICD-10-CM

## 2019-09-12 DIAGNOSIS — M5442 Lumbago with sciatica, left side: Secondary | ICD-10-CM

## 2019-09-12 DIAGNOSIS — I10 Essential (primary) hypertension: Secondary | ICD-10-CM

## 2019-09-12 DIAGNOSIS — M25511 Pain in right shoulder: Secondary | ICD-10-CM | POA: Diagnosis not present

## 2019-09-12 DIAGNOSIS — M25512 Pain in left shoulder: Secondary | ICD-10-CM

## 2019-09-12 DIAGNOSIS — G8929 Other chronic pain: Secondary | ICD-10-CM

## 2019-09-12 DIAGNOSIS — M25532 Pain in left wrist: Secondary | ICD-10-CM

## 2019-09-12 DIAGNOSIS — G894 Chronic pain syndrome: Secondary | ICD-10-CM

## 2019-09-12 DIAGNOSIS — M25552 Pain in left hip: Secondary | ICD-10-CM

## 2019-09-12 DIAGNOSIS — M25549 Pain in joints of unspecified hand: Secondary | ICD-10-CM | POA: Diagnosis not present

## 2019-09-12 DIAGNOSIS — F1190 Opioid use, unspecified, uncomplicated: Secondary | ICD-10-CM

## 2019-09-12 DIAGNOSIS — M5441 Lumbago with sciatica, right side: Secondary | ICD-10-CM

## 2019-09-12 MED ORDER — OXYCODONE-ACETAMINOPHEN 10-325 MG PO TABS: 1.0000 | ORAL_TABLET | Freq: Four times a day (QID) | ORAL | 0 refills | Status: AC | PRN

## 2019-09-12 MED ORDER — OXYCODONE-ACETAMINOPHEN 10-325 MG PO TABS: 1.0000 | ORAL_TABLET | Freq: Four times a day (QID) | ORAL | 0 refills | Status: DC | PRN

## 2019-09-12 MED ORDER — SPIRONOLACTONE 25 MG PO TABS: 25.0000 mg | ORAL_TABLET | Freq: Every day | ORAL | 3 refills | Status: DC

## 2019-09-12 NOTE — Progress Notes (Signed)
Virtual Visit via Telephone Note  I connected with Krista Kennedy on 09/12/19 at 12:15 PM EST by telephone and verified that I am speaking with the correct person using two identifiers.  Location: Patient: Home Provider: [facility] control center   I discussed the limitations, risks, security and privacy concerns of performing an evaluation and management service by telephone and the availability of in person appointments. I also discussed with the patient that there may be a patient responsible charge related to this service. The patient expressed understanding and agreed to proceed.   History of Present Illness: I spoke with Krista Kennedy via telephone.  She was unable to do the video portion of the virtual conference.  She reports that she has had more cramping in the low back and more low back pain.  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  She reports that the medications continue to give her good relief she is taking them as prescribed and has no side effects.  Unfortunately more conservative therapy has been ineffective for her.  She is denying any change in lower extremity strength or function or side effects with the medications at this time.  The quality characteristic and distribution of her low back pain has been stable in nature.    Observations/Objective:  Current Outpatient Medications:  .  Ascorbic Acid (VITAMIN C) 1000 MG tablet, Take 1,000 mg by mouth daily., Disp: , Rfl:  .  baclofen (LIORESAL) 10 MG tablet, Take 1 tablet (10 mg total) by mouth 2 (two) times daily as needed (muscle tension 2/2 migraine)., Disp: 30 each, Rfl: 3 .  betamethasone dipropionate (DIPROLENE) 0.05 % ointment, daily. , Disp: , Rfl: 0 .  carvedilol (COREG) 25 MG tablet, Take 1 tablet (25 mg total) by mouth 2 (two) times daily with a  meal., Disp: 180 tablet, Rfl: 0 .  Cholecalciferol (VITAMIN D-1000 MAX ST) 1000 UNITS tablet, Take by mouth daily. , Disp: , Rfl:  .  clobetasol (TEMOVATE) 0.05 % external solution, Apply 1 application topically as needed. , Disp: , Rfl:  .  estradiol (CLIMARA - DOSED IN MG/24 HR) 0.025 mg/24hr patch, Place 1 patch (0.025 mg total) onto the skin once a week., Disp: 12 patch, Rfl: 0 .  fluocinonide (LIDEX) 0.05 % external solution, Apply 1 application topically daily. , Disp: , Rfl: 0 .  fluticasone (FLONASE) 50 MCG/ACT nasal spray, Place 2 sprays into both nostrils daily., Disp: 16 g, Rfl: 3 .  ipratropium (ATROVENT) 0.06 % nasal spray, Place 2 sprays into both nostrils 4 (four) times daily. For up to 5-7 days then stop. (Patient taking differently: Place 2 sprays into both nostrils as needed. For up to 5-7 days then stop.), Disp: 15 mL, Rfl: 0 .  Ixekizumab (TALTZ) 80 MG/ML SOSY, Inject 80 mg into the skin as directed., Disp: , Rfl:  .  levothyroxine (SYNTHROID) 100 MCG tablet, TAKE 1 TABLET(100 MCG) BY MOUTH DAILY *NEED APPT with LABS*LAST REFILL*, Disp: 90 tablet, Rfl: 0 .  lisinopril (ZESTRIL) 20 MG tablet, Take 1 tablet (20 mg total) by mouth daily., Disp: 90 tablet, Rfl: 0 .  metFORMIN (GLUCOPHAGE) 500 MG tablet, Take 1 tablet (500 mg total) by mouth 2 (two) times daily with a meal., Disp: 180 tablet, Rfl: 3 .  naloxone (NARCAN)  nasal spray 4 mg/0.1 mL, To reverse excess sedation from narcotics, Disp: 2 kit, Rfl: 1 .  omeprazole (PRILOSEC) 20 MG capsule, Take 1 capsule (20 mg total) by mouth daily., Disp: 30 capsule, Rfl: 2 .  ondansetron (ZOFRAN) 4 MG tablet, Take 1 tablet (4 mg total) by mouth every 8 (eight) hours as needed for nausea or vomiting., Disp: 20 tablet, Rfl: 0 .  oxyCODONE-acetaminophen (PERCOCET) 10-325 MG tablet, Take 1 tablet by mouth every 6 (six) hours as needed for pain., Disp: 120 tablet, Rfl: 0 .  [START ON 10/12/2019] oxyCODONE-acetaminophen (PERCOCET) 10-325 MG tablet,  Take 1 tablet by mouth every 6 (six) hours as needed for pain., Disp: 120 tablet, Rfl: 0 .  progesterone (PROMETRIUM) 100 MG capsule, Take 1 capsule (100 mg total) by mouth daily., Disp: 90 capsule, Rfl: 0 .  spironolactone (ALDACTONE) 25 MG tablet, Take 1 tablet (25 mg total) by mouth daily., Disp: 90 tablet, Rfl: 3 .  SUMAtriptan (IMITREX) 5 MG/ACT nasal spray, PLACE 1 SPRAY INTO THE NOSE EVERY 2 HOURS AS NEEDED FOR MIGRAINE (Patient not taking: Reported on 09/12/2019), Disp: 1 Inhaler, Rfl: 1 .  venlafaxine XR (EFFEXOR-XR) 150 MG 24 hr capsule, TAKE 1 CAPSULE(150 MG) BY MOUTH DAILY WITH BREAKFAST, Disp: 90 capsule, Rfl: 0  Assessment and Plan: 1. Psoriasis with arthropathy ([place])   2. Chronic pain of both shoulders   3. Pain in multiple finger joints   4. Chronic bilateral low back pain with bilateral sciatica   5. Chronic pain syndrome   6. Chronic left hip pain   7. Left wrist pain   8. Chronic sacroiliac joint pain   9. Chronic, continuous use of opioids   Based on our discussion today and upon review of the [place] practitioner database information going to refill her medications for January 7 and February 6.  I want her to continue with her stretching strengthening exercises as prescribed and previously discussed.  I have encouraged her to get out and continue ambulation with walking and exercise management.  I want her to continue follow-up with her primary care physicians for her baseline medical cares and her rheumatologic problems.  She is instructed to contact us should she have any problems with her medication management or pain control over the next 2 months with return to clinic in 2 months. .Follow Up Instructions:    I discussed the assessment and treatment plan with the patient. The patient was provided an opportunity to ask questions and all were answered. The patient agreed with the plan and demonstrated an understanding of the instructions.   The patient was advised to  call back or seek an in-person evaluation if the symptoms worsen or if the condition fails to improve as anticipated.  I provided 30 minutes of non-face-to-face time during this encounter.   Molli Barrows, MD

## 2019-09-12 NOTE — Patient Instructions (Signed)
Medication Instructions:  1- START Spironolactone Take 1 tablet (25 mg total) by mouth daily. *If you need a refill on your cardiac medications before your next appointment, please call your pharmacy*  Lab Work: 1- Your physician recommends that you return for lab work today at [facility]. (CMET, CBC, TSH) No appt is needed. Hours are M-F 7AM- 6 PM.  2- Your physician recommends that you return for lab work in: [facility] ([facility]) No appt is needed. Hours are M-F 7AM- 6 PM.  If you have labs (blood work) drawn today and your tests are completely normal, you will receive your results only by: Marland Kitchen MyChart Message (if you have MyChart) OR . A paper copy in the mail If you have any lab test that is abnormal or we need to change your treatment, we will call you to review the results.  Testing/Procedures: 1- Echo  Please return to [facility] on ______________ at _______________ AM/PM for an Echocardiogram. Your physician has requested that you have an echocardiogram. Echocardiography is a painless test that uses sound waves to create images of your heart. It provides your doctor with information about the size and shape of your heart and how well your heart's chambers and valves are working. This procedure takes approximately one hour. There are no restrictions for this procedure. Please note; depending on visual quality an IV may need to be placed.    Follow-Up: At [facility], you and your health needs are our priority.  As part of our continuing mission to provide you with exceptional heart care, we have created designated Provider Care Teams.  These Care Teams include your primary Cardiologist (physician) and Advanced Practice Providers (APPs -  Physician Assistants and Nurse Practitioners) who all work together to provide you with the care you need, when you need it.  Your next appointment:   1 month(s)  The format for your next appointment:   In  Person  Provider:    You may see Ida Rogue, MD or Christell Faith, PA-C.

## 2019-10-01 ENCOUNTER — Other Ambulatory Visit: Payer: Commercial Managed Care - PPO

## 2019-10-05 ENCOUNTER — Other Ambulatory Visit: Payer: Self-pay | Admitting: Nurse Practitioner

## 2019-10-05 DIAGNOSIS — F5101 Primary insomnia: Secondary | ICD-10-CM

## 2019-10-07 ENCOUNTER — Other Ambulatory Visit: Payer: Self-pay | Admitting: Nurse Practitioner

## 2019-10-07 DIAGNOSIS — N951 Menopausal and female climacteric states: Secondary | ICD-10-CM

## 2019-10-08 ENCOUNTER — Telehealth: Payer: Self-pay | Admitting: Cardiovascular Disease

## 2019-10-08 NOTE — Telephone Encounter (Signed)
-----   Message from Horton Finer sent at 10/08/2019 11:51 AM EST ----- Patient canceled echo due to work related reasons but is scheduled to F/U with Dr. Rockey Situ on Monday 2/8. Can you please try to reschedule? Thank you!

## 2019-10-08 NOTE — Telephone Encounter (Signed)
LVM for patient to call back and reschedule both appointments

## 2019-10-09 ENCOUNTER — Other Ambulatory Visit: Payer: Self-pay | Admitting: Family Medicine

## 2019-10-09 ENCOUNTER — Other Ambulatory Visit: Payer: Self-pay

## 2019-10-09 ENCOUNTER — Encounter: Payer: Self-pay | Admitting: Family Medicine

## 2019-10-09 ENCOUNTER — Ambulatory Visit (INDEPENDENT_AMBULATORY_CARE_PROVIDER_SITE_OTHER): Payer: Commercial Managed Care - PPO | Admitting: Family Medicine

## 2019-10-09 DIAGNOSIS — R300 Dysuria: Secondary | ICD-10-CM | POA: Diagnosis not present

## 2019-10-09 DIAGNOSIS — E079 Disorder of thyroid, unspecified: Secondary | ICD-10-CM

## 2019-10-09 DIAGNOSIS — N3001 Acute cystitis with hematuria: Secondary | ICD-10-CM

## 2019-10-09 LAB — POCT URINALYSIS DIPSTICK
Bilirubin, UA: NEGATIVE
Glucose, UA: NEGATIVE
Ketones, UA: NEGATIVE
Nitrite, UA: POSITIVE
Odor: NEGATIVE
Protein, UA: NEGATIVE
Spec Grav, UA: >=1.03 — AB (ref 1.010–1.025)
Urobilinogen, UA: 0.2 E.U./dL
pH, UA: 5 (ref 5.0–8.0)

## 2019-10-09 MED ORDER — LEVOTHYROXINE SODIUM 100 MCG PO TABS: 100.0000 ug | ORAL_TABLET | Freq: Every day | ORAL | 0 refills | Status: DC

## 2019-10-09 MED ORDER — CEPHALEXIN 500 MG PO CAPS: 500.0000 mg | ORAL_CAPSULE | Freq: Three times a day (TID) | ORAL | 0 refills | Status: DC

## 2019-10-09 NOTE — Telephone Encounter (Signed)
-----   Message from Horton Finer sent at 10/08/2019 11:51 AM EST ----- Patient canceled echo due to work related reasons but is scheduled to F/U with Dr. Rockey Situ on Monday 2/8. Can you please try to reschedule? Thank you!

## 2019-10-09 NOTE — Progress Notes (Signed)
Virtual Visit via Telephone The purpose of this virtual visit is to provide medical care while limiting exposure to the novel coronavirus (COVID19) for both patient and office staff.  Consent was obtained for phone visit:  Yes.   Answered questions that patient had about telehealth interaction:  Yes.   I discussed the limitations, risks, security and privacy concerns of performing an evaluation and management service by telephone. I also discussed with the patient that there may be a patient responsible charge related to this service. The patient expressed understanding and agreed to proceed.  Patient Location: Home Provider Location: [facility])  ---------------------------------------------------------------------- Chief Complaint  Patient presents with  . Dysuria    low back pain, low abdominal pain and pressure x 4-5 days    S: Reviewed CMA documentation. I have called patient and gathered additional HPI as follows:  DYSURIA / Pelvic Pressure Reports that symptoms started 4-5 days ago with dysuria, urinary frequency and urgency, pain. Similar to prior UTI, last in 02/2019 treated with keflex and it resolved. She has typical symptoms in past. Usually resolve w/ OTC remedy and med cranberry AZO - She is unable to go to work today due to pain and symptoms and urinary symptoms Denies any fevers, chills, sweats, body ache, cough, shortness of breath, sinus pain or pressure, headache, abdominal pain, diarrhea   Denies any high risk travel to areas of current concern for COVID19. Denies any known or suspected exposure to person with or possibly with COVID19.  Denies any fevers, chills, sweats, body ache, cough, shortness of breath, sinus pain or pressure, headache, abdominal pain, diarrhea  Past Medical History:  Diagnosis Date  . Arthritis   . Cardiomyopathy ([place])   . CHF (congestive heart failure) ([place])   . Hyperlipidemia   . Hypertension   . Psoriasis   .  Thyroid disease   . Transient weakness of left leg 08/13/2019   Social History   Tobacco Use  . Smoking status: Former Smoker    Packs/day: 0.25    Years: 15.00    Pack years: 3.75    Types: Cigarettes  . Smokeless tobacco: Former Network engineer Use Topics  . Alcohol use: No  . Drug use: No    Current Outpatient Medications:  .  Ascorbic Acid (VITAMIN C) 1000 MG tablet, Take 1,000 mg by mouth daily., Disp: , Rfl:  .  baclofen (LIORESAL) 10 MG tablet, Take 1 tablet (10 mg total) by mouth 2 (two) times daily as needed (muscle tension 2/2 migraine)., Disp: 30 each, Rfl: 3 .  betamethasone dipropionate (DIPROLENE) 0.05 % ointment, daily. , Disp: , Rfl: 0 .  carvedilol (COREG) 25 MG tablet, Take 1 tablet (25 mg total) by mouth 2 (two) times daily with a meal., Disp: 180 tablet, Rfl: 0 .  Cholecalciferol (VITAMIN D-1000 MAX ST) 1000 UNITS tablet, Take by mouth daily. , Disp: , Rfl:  .  clobetasol (TEMOVATE) 0.05 % external solution, Apply 1 application topically as needed. , Disp: , Rfl:  .  estradiol (CLIMARA - DOSED IN MG/24 HR) 0.025 mg/24hr patch, Place 1 patch (0.025 mg total) onto the skin once a week., Disp: 12 patch, Rfl: 0 .  fluocinonide (LIDEX) 0.05 % external solution, Apply 1 application topically daily. , Disp: , Rfl: 0 .  fluticasone (FLONASE) 50 MCG/ACT nasal spray, Place 2 sprays into both nostrils daily., Disp: 16 g, Rfl: 3 .  ipratropium (ATROVENT) 0.06 % nasal spray, Place 2 sprays into both nostrils  4 (four) times daily. For up to 5-7 days then stop. (Patient taking differently: Place 2 sprays into both nostrils as needed. For up to 5-7 days then stop.), Disp: 15 mL, Rfl: 0 .  Ixekizumab (TALTZ) 80 MG/ML SOSY, Inject 80 mg into the skin as directed., Disp: , Rfl:  .  lisinopril (ZESTRIL) 20 MG tablet, Take 1 tablet (20 mg total) by mouth daily., Disp: 90 tablet, Rfl: 0 .  metFORMIN (GLUCOPHAGE) 500 MG tablet, Take 1 tablet (500 mg total) by mouth 2 (two) times daily with  a meal., Disp: 180 tablet, Rfl: 3 .  naloxone (NARCAN) nasal spray 4 mg/0.1 mL, To reverse excess sedation from narcotics, Disp: 2 kit, Rfl: 1 .  [START ON 10/12/2019] oxyCODONE-acetaminophen (PERCOCET) 10-325 MG tablet, Take 1 tablet by mouth every 6 (six) hours as needed for pain., Disp: 120 tablet, Rfl: 0 .  progesterone (PROMETRIUM) 100 MG capsule, TAKE 1 CAPSULE(100 MG) BY MOUTH DAILY, Disp: 90 capsule, Rfl: 0 .  spironolactone (ALDACTONE) 25 MG tablet, Take 1 tablet (25 mg total) by mouth daily., Disp: 90 tablet, Rfl: 3 .  SUMAtriptan (IMITREX) 5 MG/ACT nasal spray, PLACE 1 SPRAY INTO THE NOSE EVERY 2 HOURS AS NEEDED FOR MIGRAINE, Disp: 1 Inhaler, Rfl: 1 .  venlafaxine XR (EFFEXOR-XR) 150 MG 24 hr capsule, TAKE 1 CAPSULE(150 MG) BY MOUTH DAILY WITH BREAKFAST, Disp: 90 capsule, Rfl: 0 .  cephALEXin (KEFLEX) 500 MG capsule, Take 1 capsule (500 mg total) by mouth 3 (three) times daily. For 7 days, Disp: 21 capsule, Rfl: 0 .  levothyroxine (SYNTHROID) 100 MCG tablet, Take 1 tablet (100 mcg total) by mouth daily before breakfast., Disp: 90 tablet, Rfl: 0 .  omeprazole (PRILOSEC) 20 MG capsule, Take 1 capsule (20 mg total) by mouth daily. (Patient not taking: Reported on 10/09/2019), Disp: 30 capsule, Rfl: 2 .  ondansetron (ZOFRAN) 4 MG tablet, Take 1 tablet (4 mg total) by mouth every 8 (eight) hours as needed for nausea or vomiting. (Patient not taking: Reported on 10/09/2019), Disp: 20 tablet, Rfl: 0 .  oxyCODONE-acetaminophen (PERCOCET) 10-325 MG tablet, Take 1 tablet by mouth every 6 (six) hours as needed for pain. (Patient not taking: Reported on 10/09/2019), Disp: 120 tablet, Rfl: 0  Depression screen [facility] 2/9 10/09/2019 06/26/2019 03/05/2019  Decreased Interest 0 1 1  Down, Depressed, Hopeless 0 3 0  PHQ - 2 Score 0 4 1  Altered sleeping 0 3 0  Tired, decreased energy 0 3 1  Change in appetite 0 3 1  Feeling bad or failure about yourself  0 0 0  Trouble concentrating 0 1 1  Moving slowly or  fidgety/restless 0 0 0  Suicidal thoughts 0 0 0  PHQ-9 Score 0 14 4  Difficult doing work/chores Not difficult at all Somewhat difficult Not difficult at all  Some recent data might be hidden    GAD 7 : Generalized Anxiety Score 06/26/2019  Nervous, Anxious, on Edge 3  Control/stop worrying 3  Worry too much - different things 0  Trouble relaxing 3  Restless 0  Easily annoyed or irritable 0  Afraid - awful might happen 0  Total GAD 7 Score 9  Anxiety Difficulty Very difficult    -------------------------------------------------------------------------- O: No physical exam performed due to remote telephone encounter.  Lab results reviewed.  Recent Results (from the past 2160 hour(s))  POCT Urinalysis Dipstick     Status: Abnormal   Collection Time: 10/09/19  2:25 PM  Result Value Ref Range  Color, UA dark orange    Clarity, UA cloudy    Glucose, UA Negative Negative   Bilirubin, UA negative    Ketones, UA negative    Spec Grav, UA >=1.030 (A) 1.010 - 1.025   Blood, UA moderate    pH, UA 5.0 5.0 - 8.0   Protein, UA Negative Negative   Urobilinogen, UA 0.2 0.2 or 1.0 E.U./dL   Nitrite, UA positive    Leukocytes, UA Moderate (2+) (A) Negative   Appearance cloudy    Odor negative     -------------------------------------------------------------------------- A&P:  Problem List Items Addressed This Visit    None    Visit Diagnoses    Acute cystitis with hematuria    -  Primary   Relevant Medications   cephALEXin (KEFLEX) 500 MG capsule   Dysuria       Relevant Orders   POCT Urinalysis Dipstick (Completed)   Urine Culture     Clinically consistent with UTI and confirmed on UA. No recent UTIs or abx courses, last was Keflex in 02/2019 for similar URI. No concern for pyelo today (no systemic symptoms, neg fever, n/v).  Plan: 1. UA / micro - positive nitrite, mod  leuks, blood/hgb mod 2. Ordered Urine culture 3. Keflex '500mg'$  TID x 7 days 4. Improve PO  hydration 5. RTC if no improvement 1 week, red flags given to return sooner  Meds ordered this encounter  Medications  . cephALEXin (KEFLEX) 500 MG capsule    Sig: Take 1 capsule (500 mg total) by mouth 3 (three) times daily. For 7 days    Dispense:  21 capsule    Refill:  0    Follow-up: - Return in 1 week if not improved  Patient verbalizes understanding with the above medical recommendations including the limitation of remote medical advice.  Specific follow-up and call-back criteria were given for patient to follow-up or seek medical care more urgently if needed.   - Time spent in direct consultation with patient on phone: 6 minutes  Nobie Putnam, [facility] 10/09/2019, 2:38 PM

## 2019-10-09 NOTE — Patient Instructions (Addendum)
You have a Urinary Tract Infection - this is very common, your symptoms are reassuring and you should get better within 1 week on the antibiotics - Start Keflex 500mg  3 times daily for next 7 days, complete entire course, even if feeling better - We sent urine for a culture, we will call you within next few days if we need to change antibiotics - Please drink plenty of fluids, improve hydration over next 1 week  If symptoms worsening, developing nausea / vomiting, worsening back pain, fevers / chills / sweats, then please return for re-evaluation sooner.  Please schedule a Follow-up Appointment to: Return in about 1 week (around 10/16/2019), or if symptoms worsen or fail to improve, for UTI.  If you have any other questions or concerns, please feel free to call the office or send a message through [place]. You may also schedule an earlier appointment if necessary.  Additionally, you may be receiving a survey about your experience at our office within a few days to 1 week by e-mail or mail. We value your feedback.  Nobie Putnam, DO [place]

## 2019-10-09 NOTE — Telephone Encounter (Signed)
lmov to reschedule   °

## 2019-10-12 LAB — URINE CULTURE
MICRO NUMBER:: 10111775
SPECIMEN QUALITY:: ADEQUATE

## 2019-10-12 NOTE — Progress Notes (Deleted)
Cardiology Office Note  Date:  10/12/2019   ID:  Alilah Werden, DOB 08/19/1966, MRN [id number]  PCP:  Mikey College, NP (Inactive)   No chief complaint on file.   HPI:  [name] is a 54 year old woman with past medical history of Dilated cardiomyopathy/peripartum in 2002 EF 60% in 2014 Insomnia Former smoker GERD HTN Referral by [name] for consultation of her  CHF, palpitations  Peripartum cardiomyopathy/  Chronic systolic CHF -  Dx 0272 after the birth of her son , EF 25% originally -with associated lower extremity edema weight gain shortness of breath most recently EF 102s  Evaluated by cardiology in 2014 Normal echo in 2014 At that time reported: "progressive shortness breath occurs doing her housework-vacuuming or other light housework.  She's never felt well since her original diagnosis in 2002. She now has trouble doing her normal activities.  "  She contacted out office on 09/10/2019 noting BP readings of 181/101 and 190/118. She was taking Coreg 25 mg bid and lisinopril 20 mg daily. In this setting, she was scheduled for today.   Since the above BP readings, she has continued to note elevations in her blood pressure ranging in the 536U to 440H systolic.  She has been compliant with carvedilol and lisinopril and denies any recent increased stressors or changes in her diet.  She did note some increased shortness of breath when her blood pressures were in the 474Q to 595G systolic which has subsequently improved with slight improvement in her blood pressure.  She has noted BP in the 387F systolic first thing in the morning after resting all night.  She denies any snoring or apneic episodes.  No lower extremity swelling, abdominal distention, orthopnea, PND, or early satiety.  No chest pain.  Her weight has remained stable.     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  PSH:    Past Surgical History:  Procedure Laterality Date  . ENDOMETRIAL ABLATION  2015  . THYROIDECTOMY    . TONSILLECTOMY    . TUBAL LIGATION      Current Outpatient Medications  Medication Sig Dispense Refill  . Ascorbic Acid (VITAMIN C) 1000 MG tablet Take 1,000 mg by mouth daily.    . baclofen (LIORESAL) 10 MG tablet Take 1 tablet (10 mg total) by mouth 2 (two) times daily as needed (muscle tension 2/2 migraine). 30 each 3  . betamethasone dipropionate (DIPROLENE) 0.05 % ointment daily.   0  . carvedilol (COREG) 25 MG tablet Take 1 tablet (25 mg total) by mouth 2 (two) times daily with a meal. 180 tablet 0  . cephALEXin (KEFLEX) 500 MG capsule Take 1 capsule (500 mg total) by mouth 3 (three) times daily. For 7 days 21 capsule 0  . Cholecalciferol (VITAMIN D-1000 MAX ST) 1000 UNITS tablet Take by mouth daily.     . clobetasol (TEMOVATE) 0.05 % external solution Apply 1 application topically as needed.     Marland Kitchen estradiol (CLIMARA -  DOSED IN MG/24 HR) 0.025 mg/24hr patch Place 1 patch (0.025 mg total) onto the skin once a week. 12 patch 0  . fluocinonide (LIDEX) 0.05 % external solution Apply 1 application topically daily.   0  . fluticasone (FLONASE) 50 MCG/ACT nasal spray Place 2 sprays into both nostrils daily. 16 g 3  . ipratropium (ATROVENT) 0.06 % nasal spray Place 2 sprays into both nostrils 4 (four) times daily. For up to 5-7 days then stop. (Patient taking differently: Place 2 sprays into both nostrils as needed. For up to 5-7 days then stop.) 15 mL  0  . Ixekizumab (TALTZ) 80 MG/ML SOSY Inject 80 mg into the skin as directed.    Marland Kitchen levothyroxine (SYNTHROID) 100 MCG tablet Take 1 tablet (100 mcg total) by mouth daily before breakfast. 90 tablet 0  . lisinopril (ZESTRIL) 20 MG tablet Take 1 tablet (20 mg total) by mouth daily. 90 tablet 0  . metFORMIN (GLUCOPHAGE) 500 MG tablet Take 1 tablet (500 mg total) by mouth 2 (two) times daily with a meal. 180 tablet 3  . naloxone (NARCAN) nasal spray 4 mg/0.1 mL To reverse excess sedation from narcotics 2 kit 1  . omeprazole (PRILOSEC) 20 MG capsule Take 1 capsule (20 mg total) by mouth daily. (Patient not taking: Reported on 10/09/2019) 30 capsule 2  . ondansetron (ZOFRAN) 4 MG tablet Take 1 tablet (4 mg total) by mouth every 8 (eight) hours as needed for nausea or vomiting. (Patient not taking: Reported on 10/09/2019) 20 tablet 0  . oxyCODONE-acetaminophen (PERCOCET) 10-325 MG tablet Take 1 tablet by mouth every 6 (six) hours as needed for pain. (Patient not taking: Reported on 10/09/2019) 120 tablet 0  . oxyCODONE-acetaminophen (PERCOCET) 10-325 MG tablet Take 1 tablet by mouth every 6 (six) hours as needed for pain. 120 tablet 0  . progesterone (PROMETRIUM) 100 MG capsule TAKE 1 CAPSULE(100 MG) BY MOUTH DAILY 90 capsule 0  . spironolactone (ALDACTONE) 25 MG tablet Take 1 tablet (25 mg total) by mouth daily. 90 tablet 3  . SUMAtriptan (IMITREX) 5 MG/ACT nasal spray PLACE 1 SPRAY INTO THE NOSE EVERY 2 HOURS AS NEEDED FOR MIGRAINE 1 Inhaler 1  . venlafaxine XR (EFFEXOR-XR) 150 MG 24 hr capsule TAKE 1 CAPSULE(150 MG) BY MOUTH DAILY WITH BREAKFAST 90 capsule 0   No current facility-administered medications for this visit.     Allergies:   Other, Sulfa antibiotics, and Sulfacetamide sodium   Social History:  The patient  reports that she has quit smoking. Her smoking use included cigarettes. She has a 3.75 pack-year smoking history. She has quit using smokeless tobacco. She reports that she does not drink  alcohol or use drugs.   Family History:   family history includes Heart disease in her father; Hyperlipidemia in her father and mother; Hypertension in her father and mother.    Review of Systems: Review of Systems  Constitutional: Negative.   Respiratory: Negative.   Cardiovascular: Negative.   Gastrointestinal: Negative.   Musculoskeletal: Negative.   Neurological: Negative.   Psychiatric/Behavioral: Negative.   All other systems reviewed and are negative.    PHYSICAL EXAM: VS:  There were no vitals taken for this visit. , BMI There is no height or weight on file to calculate BMI. GEN: Well nourished, well developed, in no acute distress  HEENT: normal  Neck: no JVD, carotid bruits, or masses Cardiac: RRR; no murmurs, rubs, or gallops,no edema  Respiratory:  clear to auscultation bilaterally, normal work of breathing  GI: soft, nontender, nondistended, + BS MS: no deformity or atrophy  Skin: warm and dry, no rash Neuro:  Strength and sensation are intact Psych: euthymic mood, full affect    Recent Labs: No results found for requested labs within last 8760 hours.    Lipid Panel Lab Results  Component Value Date   CHOL 194 07/20/2018   HDL 41 (L) 07/20/2018   LDLCALC 129 (H) 07/20/2018   TRIG 128 07/20/2018      Wt Readings from Last 3 Encounters:  09/12/19 169 lb 4 oz (76.8 kg)  11/01/18 171 lb 12.8 oz (77.9 kg)  11/01/18 172 lb (78 kg)       ASSESSMENT AND PLAN:  History of cardiomyopathy - Plan: EKG 12-Lead Remote history of cardiomyopathy 2002 Recovered from postpartum cardiomyopathy, normal ejection fraction several years ago Does not appear to have regressed, appears euvolemic Recommend she hold the digoxin Discussed clinical features to watch for for signs of worsening heart failure such as leg swelling, abdominal bloating, weight gain  Essential hypertension - Plan: EKG 12-Lead, lisinopril (PRINIVIL,ZESTRIL) 20 MG tablet Reports blood pressure  running high at home We will increase lisinopril up to 20 mg daily If blood pressure continues to run high , may need 40 mg daily  Shortness of breath Very mild shortness of breath seems to come and go, otherwise good exercise tolerance Works at [employer] on her feet all day No further testing ordered  Palpitations Likely APCs or PVCs Seem to have resolved Discussed if symptoms get worse could try extra carvedilol or even propranolol 10 to 20 mg as needed Also if symptoms return could order a extended monitor/Zio She will call us if she would like any of these  Disposition:   F/U as needed  Patient was seen in consultation for [name] and will be referred back to her office for ongoing care of the issues detailed above   Total encounter time more than 60 minutes  Greater than 50% was spent in counseling and coordination of care with the patient    No orders of the defined types were placed in this encounter.    Signed, Esmond Plants, M.D., Ph.D. 10/12/2019  [place], [place]

## 2019-10-14 ENCOUNTER — Ambulatory Visit: Payer: Commercial Managed Care - PPO | Admitting: Cardiovascular Disease

## 2019-10-15 ENCOUNTER — Encounter: Payer: Self-pay | Admitting: Cardiovascular Disease

## 2019-10-18 NOTE — Telephone Encounter (Signed)
Attempted to schedule. Patient will call back another time when she has her schedule.

## 2019-10-24 ENCOUNTER — Telehealth: Payer: Self-pay | Admitting: Family Medicine

## 2019-10-24 DIAGNOSIS — N3001 Acute cystitis with hematuria: Secondary | ICD-10-CM

## 2019-10-24 MED ORDER — CIPROFLOXACIN HCL 500 MG PO TABS: 500.0000 mg | ORAL_TABLET | Freq: Two times a day (BID) | ORAL | 0 refills | Status: DC

## 2019-10-24 NOTE — Telephone Encounter (Signed)
No appointment needed.  I can send new rx antibiotic - I would switch to Cipro for her. Based on urine culture.  Will send now to pharmacy.  Please let her know to be cautious with high impact activity, on this class of medication there is slight increased risk of a muscle tendon injury.  Nobie Putnam, DO [facility] 10/24/2019, 3:08 PM

## 2019-10-24 NOTE — Telephone Encounter (Signed)
Patient informed. 

## 2019-10-24 NOTE — Telephone Encounter (Signed)
Patient was seen on 02/03 for UTI was Rx keflex her Sxs improved after ABX  but now it is coming back onset yesterday, she was asking does she need to schedule another apt or can be Rx another round of ABX ?

## 2019-11-07 ENCOUNTER — Other Ambulatory Visit: Payer: Self-pay

## 2019-11-07 ENCOUNTER — Encounter: Payer: Self-pay | Admitting: Anesthesiology

## 2019-11-07 ENCOUNTER — Ambulatory Visit: Payer: Commercial Managed Care - PPO | Attending: Anesthesiology | Admitting: Anesthesiology

## 2019-11-07 DIAGNOSIS — M25549 Pain in joints of unspecified hand: Secondary | ICD-10-CM | POA: Diagnosis not present

## 2019-11-07 DIAGNOSIS — L4050 Arthropathic psoriasis, unspecified: Secondary | ICD-10-CM | POA: Diagnosis not present

## 2019-11-07 DIAGNOSIS — G8929 Other chronic pain: Secondary | ICD-10-CM

## 2019-11-07 DIAGNOSIS — M5442 Lumbago with sciatica, left side: Secondary | ICD-10-CM | POA: Diagnosis not present

## 2019-11-07 DIAGNOSIS — M25512 Pain in left shoulder: Secondary | ICD-10-CM

## 2019-11-07 DIAGNOSIS — F1190 Opioid use, unspecified, uncomplicated: Secondary | ICD-10-CM

## 2019-11-07 DIAGNOSIS — G894 Chronic pain syndrome: Secondary | ICD-10-CM

## 2019-11-07 DIAGNOSIS — M25552 Pain in left hip: Secondary | ICD-10-CM

## 2019-11-07 DIAGNOSIS — M25511 Pain in right shoulder: Secondary | ICD-10-CM

## 2019-11-07 DIAGNOSIS — M5441 Lumbago with sciatica, right side: Secondary | ICD-10-CM

## 2019-11-07 MED ORDER — OXYCODONE-ACETAMINOPHEN 10-325 MG PO TABS: 1.0000 | ORAL_TABLET | Freq: Four times a day (QID) | ORAL | 0 refills | Status: DC | PRN

## 2019-11-07 MED ORDER — OXYCODONE-ACETAMINOPHEN 10-325 MG PO TABS: 1.0000 | ORAL_TABLET | Freq: Four times a day (QID) | ORAL | 0 refills | Status: AC | PRN

## 2019-11-08 ENCOUNTER — Encounter: Payer: Self-pay | Admitting: Anesthesiology

## 2019-11-08 ENCOUNTER — Other Ambulatory Visit: Payer: Self-pay

## 2019-11-08 ENCOUNTER — Ambulatory Visit (INDEPENDENT_AMBULATORY_CARE_PROVIDER_SITE_OTHER): Payer: Commercial Managed Care - PPO

## 2019-11-08 DIAGNOSIS — R0602 Shortness of breath: Secondary | ICD-10-CM

## 2019-11-08 NOTE — Progress Notes (Signed)
Virtual Visit via Telephone Note  I connected with Krista Kennedy on 11/08/19 at  2:00 PM EST by telephone and verified that I am speaking with the correct person using two identifiers.  Location: Patient: Home Provider: Pain control center   I discussed the limitations, risks, security and privacy concerns of performing an evaluation and management service by telephone and the availability of in person appointments. I also discussed with the patient that there may be a patient responsible charge related to this service. The patient expressed understanding and agreed to proceed.   History of Present Illness: I spoke with Krista Kennedy via telephone.  She was unable to do the video portion of the virtual conference.  Since our last meeting she states that she has been doing reasonably well.  She occasionally has some exacerbation of the low back pain but this is manageable.  She still having some hip and shoulder pain comparable to what she is had in the past.  Unfortunately she has required chronic opioid management and this is worked well for her.  No changes in the quality characteristic or distribution of her pain have been noted recently.  Based on our discussion today she continues to derive good functional lifestyle improvement with her medications and has been stable otherwise.    Observations/Objective:  Current Outpatient Medications:  .  Ascorbic Acid (VITAMIN C) 1000 MG tablet, Take 1,000 mg by mouth daily., Disp: , Rfl:  .  baclofen (LIORESAL) 10 MG tablet, Take 1 tablet (10 mg total) by mouth 2 (two) times daily as needed (muscle tension 2/2 migraine)., Disp: 30 each, Rfl: 3 .  betamethasone dipropionate (DIPROLENE) 0.05 % ointment, daily. , Disp: , Rfl: 0 .  carvedilol (COREG) 25 MG tablet, Take 1 tablet (25 mg total) by mouth 2 (two) times daily with a meal., Disp: 180 tablet, Rfl: 0 .  Cholecalciferol (VITAMIN D-1000 MAX ST) 1000 UNITS tablet, Take by mouth daily. , Disp: , Rfl:  .   ciprofloxacin (CIPRO) 500 MG tablet, Take 1 tablet (500 mg total) by mouth 2 (two) times daily. For 7 days, Disp: 14 tablet, Rfl: 0 .  clobetasol (TEMOVATE) 0.05 % external solution, Apply 1 application topically as needed. , Disp: , Rfl:  .  estradiol (CLIMARA - DOSED IN MG/24 HR) 0.025 mg/24hr patch, Place 1 patch (0.025 mg total) onto the skin once a week., Disp: 12 patch, Rfl: 0 .  fluocinonide (LIDEX) 0.05 % external solution, Apply 1 application topically daily. , Disp: , Rfl: 0 .  fluticasone (FLONASE) 50 MCG/ACT nasal spray, Place 2 sprays into both nostrils daily., Disp: 16 g, Rfl: 3 .  ipratropium (ATROVENT) 0.06 % nasal spray, Place 2 sprays into both nostrils 4 (four) times daily. For up to 5-7 days then stop. (Patient taking differently: Place 2 sprays into both nostrils as needed. For up to 5-7 days then stop.), Disp: 15 mL, Rfl: 0 .  Ixekizumab (TALTZ) 80 MG/ML SOSY, Inject 80 mg into the skin as directed., Disp: , Rfl:  .  levothyroxine (SYNTHROID) 100 MCG tablet, Take 1 tablet (100 mcg total) by mouth daily before breakfast., Disp: 90 tablet, Rfl: 0 .  lisinopril (ZESTRIL) 20 MG tablet, Take 1 tablet (20 mg total) by mouth daily., Disp: 90 tablet, Rfl: 0 .  metFORMIN (GLUCOPHAGE) 500 MG tablet, Take 1 tablet (500 mg total) by mouth 2 (two) times daily with a meal., Disp: 180 tablet, Rfl: 3 .  naloxone (NARCAN) nasal spray 4 mg/0.1 mL, To reverse  excess sedation from narcotics, Disp: 2 kit, Rfl: 1 .  omeprazole (PRILOSEC) 20 MG capsule, Take 1 capsule (20 mg total) by mouth daily. (Patient not taking: Reported on 10/09/2019), Disp: 30 capsule, Rfl: 2 .  ondansetron (ZOFRAN) 4 MG tablet, Take 1 tablet (4 mg total) by mouth every 8 (eight) hours as needed for nausea or vomiting. (Patient not taking: Reported on 10/09/2019), Disp: 20 tablet, Rfl: 0 .  oxyCODONE-acetaminophen (PERCOCET) 10-325 MG tablet, Take 1 tablet by mouth every 6 (six) hours as needed for pain., Disp: 120 tablet, Rfl: 0 .   [START ON 12/10/2019] oxyCODONE-acetaminophen (PERCOCET) 10-325 MG tablet, Take 1 tablet by mouth every 6 (six) hours as needed for pain., Disp: 120 tablet, Rfl: 0 .  progesterone (PROMETRIUM) 100 MG capsule, TAKE 1 CAPSULE(100 MG) BY MOUTH DAILY, Disp: 90 capsule, Rfl: 0 .  spironolactone (ALDACTONE) 25 MG tablet, Take 1 tablet (25 mg total) by mouth daily., Disp: 90 tablet, Rfl: 3 .  SUMAtriptan (IMITREX) 5 MG/ACT nasal spray, PLACE 1 SPRAY INTO THE NOSE EVERY 2 HOURS AS NEEDED FOR MIGRAINE, Disp: 1 Inhaler, Rfl: 1 .  venlafaxine XR (EFFEXOR-XR) 150 MG 24 hr capsule, TAKE 1 CAPSULE(150 MG) BY MOUTH DAILY WITH BREAKFAST, Disp: 90 capsule, Rfl: 0  Assessment and Plan: 1. Psoriasis with arthropathy ([place])   2. Chronic pain of both shoulders   3. Pain in multiple finger joints   4. Chronic bilateral low back pain with bilateral sciatica   5. Chronic pain syndrome   6. Chronic, continuous use of opioids   7. Chronic left hip pain   Based on discussion today I think is appropriate to refill her medications.  I have reviewed the [facility] practitioner database information.  Refills will be dated for March 4 and Lare[place] 6.  I am scheduling her for return to clinic in 2 months.  I want her to continue with her back stretching strengthening exercises and continue follow-up with her primary care physicians for baseline medical care.  Follow Up Instructions:    I discussed the assessment and treatment plan with the patient. The patient was provided an opportunity to ask questions and all were answered. The patient agreed with the plan and demonstrated an understanding of the instructions.   The patient was advised to call back or seek an in-person evaluation if the symptoms worsen or if the condition fails to improve as anticipated.  I provided 30 minutes of non-face-to-face time during this encounter.   Molli Barrows, MD

## 2019-11-11 ENCOUNTER — Telehealth: Payer: Self-pay

## 2019-11-11 NOTE — Telephone Encounter (Signed)
Patient returning call.

## 2019-11-11 NOTE — Telephone Encounter (Signed)
Attempted to call patient. LMTCB 11/11/2019

## 2019-11-11 NOTE — Telephone Encounter (Signed)
-----   Message from Rise Mu, PA-C sent at 11/10/2019  4:31 PM EST ----- Echo showed a slightly reduced pump function of 40-45% (normal 55-60%), mildly thickened heart, slightly stiffened heart, normal pressure in the right side of the heart, and mildly leaky mitral and tricuspid valves.   Compared to prior echo from 2014, pump function is slightly lower, stiffened heart is unchanged, and mildly leaky heart valves are stable.   Recommendations:-She was started on spironolactone at her last visit with recommendation to have labs that day and again in 1 week. I do not see that she ever completed any of these labs. Please advised the patient to come to the [facility] this week for the previously recommended labs.  -Optimal BP control, she was recently started on spironolactone in 09/2019 -I cannot further address her hypertensive medications without the previously recommended follow up labs -It was recommended she follow up with Dr. Rockey Situ 1 month after visit in 09/2019. I do not see that she has an appointment scheduled. Please get her scheduled to see Dr. Rockey Situ for follow up of her cardiomyopathy to discuss if further testing is needed as well as to follow up on her BP.

## 2019-11-12 NOTE — Telephone Encounter (Signed)
Incoming call returned by patient. Reviewed results from Echo and POC.   Pt verbalized understanding and appt was made for next Monday with Dr. Rockey Situ.   Pt is coming on Thursday for labs.   Advised pt to call for any further questions or concerns.

## 2019-11-17 NOTE — Progress Notes (Signed)
Cardiology Office Note  Date:  11/18/2019   ID:  Krista Kennedy, DOB 08/28/1966, MRN [id number]  PCP:  Mikey College, NP (Inactive)   Chief Complaint  Patient presents with  . Other    Follow up post ECHO. Meds reviewed verbally with patient.     HPI:  [name] is a 54 year old woman with past medical history of Dilated cardiomyopathy/peripartum in 2002 EF 60% in 2014 Insomnia Former smoker GERD HTN Who presents for follow-up of her cardiomyopathy, systolic CHF, palpitations  Prior history reviewed Peripartum cardiomyopathy/  Chronic systolic CHF -  Dx 2353 after the birth of her son , EF 25% originally -with associated lower extremity edema weight gain shortness of breath most recently EF 60s  Most recent notes reviewed from January 2021 She contacted out office on 09/10/2019 noting BP readings of 181/101 and 190/118. She was taking Coreg 25 mg bid and lisinopril 20 mg daily.   Started on spironolactone  Echocardiogram ordered Details reviewed, images pulled up and reviewed 1. Left ventricular ejection fraction, by estimation, is 40 to 45%. The  left ventricle has mildly decreased function. The left ventricle  demonstrates global hypokinesis. There is mild left ventricular  hypertrophy. Left ventricular diastolic parameters  are consistent with Grade I diastolic dysfunction (impaired relaxation).  2. Right ventricular systolic function is normal. The right ventricular  size is normal. There is normal pulmonary artery systolic pressure.   Comparison(s): EF 50-55%.  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  PSH:    Past Surgical History:  Procedure Laterality Date  . ENDOMETRIAL ABLATION  2015  . THYROIDECTOMY    . TONSILLECTOMY    . TUBAL LIGATION      Current Outpatient Medications  Medication Sig Dispense Refill  . Ascorbic Acid (VITAMIN C) 1000 MG tablet Take 1,000 mg by mouth daily.    . baclofen (LIORESAL) 10 MG tablet Take 1 tablet (10 mg total) by mouth 2 (two) times daily as needed (muscle tension 2/2 migraine). 30 each 3  . betamethasone dipropionate (DIPROLENE) 0.05 % ointment daily.   0  . carvedilol (COREG) 25 MG tablet Take 1 tablet (25 mg total) by mouth 2 (two) times daily with a meal. 180 tablet 0  . Cholecalciferol (VITAMIN D-1000 MAX ST) 1000 UNITS tablet Take by mouth daily.     . clobetasol (TEMOVATE) 0.05 % external solution Apply 1 application topically as needed.     Marland Kitchen estradiol (CLIMARA - DOSED IN MG/24 HR) 0.025 mg/24hr patch Place 1 patch (0.025 mg total) onto the skin once a week. 12 patch 0  . fluocinonide (LIDEX) 0.05 % external solution Apply 1 application topically daily.   0  . fluticasone (FLONASE) 50 MCG/ACT nasal spray Place 2 sprays into both nostrils daily. 16 g 3  . ipratropium (ATROVENT) 0.06 % nasal spray Place 2 sprays into both nostrils 4 (four) times daily. For up to 5-7 days then stop. (Patient taking differently: Place 2 sprays into both nostrils as needed. For up to 5-7 days then stop.) 15 mL 0  . Ixekizumab (TALTZ) 80 MG/ML SOSY Inject 80 mg  into the skin as directed.    Marland Kitchen levothyroxine (SYNTHROID) 100 MCG tablet Take 1 tablet (100 mcg total) by mouth daily before breakfast. 90 tablet 0  . lisinopril (ZESTRIL) 20 MG tablet Take 1 tablet (20 mg total) by mouth daily. 90 tablet 0  . metFORMIN (GLUCOPHAGE) 500 MG tablet Take 1 tablet (500 mg total) by mouth 2 (two) times daily with a meal. 180 tablet 3  . naloxone (NARCAN) nasal spray 4 mg/0.1 mL To reverse excess sedation from narcotics 2 kit 1  . ondansetron  (ZOFRAN) 4 MG tablet Take 1 tablet (4 mg total) by mouth every 8 (eight) hours as needed for nausea or vomiting. 20 tablet 0  . oxyCODONE-acetaminophen (PERCOCET) 10-325 MG tablet Take 1 tablet by mouth every 6 (six) hours as needed for pain. 120 tablet 0  . [START ON 12/10/2019] oxyCODONE-acetaminophen (PERCOCET) 10-325 MG tablet Take 1 tablet by mouth every 6 (six) hours as needed for pain. 120 tablet 0  . progesterone (PROMETRIUM) 100 MG capsule TAKE 1 CAPSULE(100 MG) BY MOUTH DAILY 90 capsule 0  . spironolactone (ALDACTONE) 25 MG tablet Take 1 tablet (25 mg total) by mouth daily. 90 tablet 3  . SUMAtriptan (IMITREX) 5 MG/ACT nasal spray PLACE 1 SPRAY INTO THE NOSE EVERY 2 HOURS AS NEEDED FOR MIGRAINE 1 Inhaler 1  . venlafaxine XR (EFFEXOR-XR) 150 MG 24 hr capsule TAKE 1 CAPSULE(150 MG) BY MOUTH DAILY WITH BREAKFAST 90 capsule 0   No current facility-administered medications for this visit.     Allergies:   Other, Sulfa antibiotics, and Sulfacetamide sodium   Social History:  The patient  reports that she has quit smoking. Her smoking use included cigarettes. She has a 3.75 pack-year smoking history. She has quit using smokeless tobacco. She reports that she does not drink alcohol or use drugs.   Family History:   family history includes Heart disease in her father; Hyperlipidemia in her father and mother; Hypertension in her father and mother.    Review of Systems: Review of Systems  Constitutional: Negative.   HENT: Negative.   Respiratory: Negative.   Cardiovascular: Negative.   Gastrointestinal: Negative.   Musculoskeletal: Negative.   Neurological: Negative.   Psychiatric/Behavioral: Negative.   All other systems reviewed and are negative.    PHYSICAL EXAM: VS:  BP 110/60 (BP Location: Left Arm, Patient Position: Sitting, Cuff Size: Normal)   Pulse 88   Ht '5\' 10"'$  (1.778 m)   Wt 173 lb (78.5 kg)   SpO2 98%   BMI 24.82 kg/m  , BMI Body mass index is 24.82  kg/m. Constitutional:  oriented to person, place, and time. No distress.  HENT:  Head: Grossly normal Eyes:  no discharge. No scleral icterus.  Neck: No JVD, no carotid bruits  Cardiovascular: Regular rate and rhythm, no murmurs appreciated Pulmonary/Chest: Clear to auscultation bilaterally, no wheezes or rails Abdominal: Soft.  no distension.  no tenderness.  Musculoskeletal: Normal range of motion Neurological:  normal muscle tone. Coordination normal. No atrophy Skin: Skin warm and dry Psychiatric: normal affect, pleasantm3   Recent Labs: No results found for requested labs within last 8760 hours.    Lipid Panel Lab Results  Component Value Date   CHOL 194 07/20/2018   HDL 41 (L) 07/20/2018   LDLCALC 129 (H) 07/20/2018   TRIG 128 07/20/2018      Wt Readings from Last 3 Encounters:  11/18/19 173 lb (78.5 kg)  09/12/19 169 lb 4 oz (76.8 kg)  11/01/18  171 lb 12.8 oz (77.9 kg)       ASSESSMENT AND PLAN:  History of cardiomyopathy - Plan: EKG 12-Lead Remote history of cardiomyopathy 2002 Recovered from postpartum cardiomyopathy,  Recent ejection fraction mildly depressed ejection fraction estimated 45% Images pulled up and reviewed Stressed importance of blood pressure control -Blood pressure well controlled on today's visit, no medication changes made Continue lisinopril, Coreg, spironolactone We have ordered a BMP -These numbers came back this evening, reviewed, glucose level 118 otherwise normal study  Essential hypertension - Blood pressure well controlled on today's visit even on my recheck No changes made  Shortness of breath Very mild shortness of breath seems to come and go, otherwise good exercise tolerance Works at [employer] on her feet all day No further testing ordered  Palpitations Stable symptoms  Disposition:   F/U 12 months   Total encounter time more than 25 minutes  Greater than 50% was spent in counseling and coordination of care  with the patient     Orders Placed This Encounter  Procedures  . EKG 12-Lead     Signed, Esmond Plants, M.D., Ph.D. 11/18/2019  [place], [place]

## 2019-11-18 ENCOUNTER — Other Ambulatory Visit: Payer: Self-pay

## 2019-11-18 ENCOUNTER — Encounter: Payer: Self-pay | Admitting: Cardiovascular Disease

## 2019-11-18 ENCOUNTER — Other Ambulatory Visit
Admission: RE | Admit: 2019-11-18 | Discharge: 2019-11-18 | Disposition: A | Discharge status: Home / Self Care | Payer: Commercial Managed Care - PPO | Source: Ambulatory Visit | Attending: Cardiovascular Disease | Admitting: Cardiovascular Disease

## 2019-11-18 ENCOUNTER — Ambulatory Visit (INDEPENDENT_AMBULATORY_CARE_PROVIDER_SITE_OTHER): Payer: Commercial Managed Care - PPO | Admitting: Cardiovascular Disease

## 2019-11-18 VITALS — BP 110/60 | HR 88 | Ht 70.0 in | Wt 173.0 lb

## 2019-11-18 DIAGNOSIS — R0602 Shortness of breath: Secondary | ICD-10-CM

## 2019-11-18 DIAGNOSIS — I10 Essential (primary) hypertension: Secondary | ICD-10-CM

## 2019-11-18 DIAGNOSIS — R002 Palpitations: Secondary | ICD-10-CM

## 2019-11-18 DIAGNOSIS — Z8679 Personal history of other diseases of the circulatory system: Secondary | ICD-10-CM | POA: Diagnosis present

## 2019-11-18 DIAGNOSIS — E039 Hypothyroidism, unspecified: Secondary | ICD-10-CM

## 2019-11-18 DIAGNOSIS — E782 Mixed hyperlipidemia: Secondary | ICD-10-CM

## 2019-11-18 LAB — BASIC METABOLIC PANEL
Anion gap: 10 (ref 5–15)
BUN: 18 mg/dL (ref 6–20)
CO2: 32 mmol/L (ref 22–32)
Calcium: 9.6 mg/dL (ref 8.9–10.3)
Chloride: 101 mmol/L (ref 98–111)
Creatinine, Ser: 0.88 mg/dL (ref 0.44–1.00)
GFR calc Af Amer: >60 mL/min (ref >60)
GFR calc non Af Amer: >60 mL/min (ref >60)
Glucose, Bld: 118 mg/dL — ABNORMAL HIGH (ref 70–99)
Potassium: 3.6 mmol/L (ref 3.5–5.1)
Sodium: 143 mmol/L (ref 135–145)

## 2019-11-18 NOTE — Patient Instructions (Addendum)
Monitor blood pressure   Medication Instructions:  No changes  If you need a refill on your cardiac medications before your next appointment, please call your pharmacy.    Lab work: [employer] [facility] [employer] [facility]   If you have labs (blood work) drawn today and your tests are completely normal, you will receive your results only by: Marland Kitchen MyChart Message (if you have MyChart) OR . A paper copy in the mail If you have any lab test that is abnormal or we need to change your treatment, we will call you to review the results.   Testing/Procedures: No new testing needed   Follow-Up: At [facility], you and your health needs are our priority.  As part of our continuing mission to provide you with exceptional heart care, we have created designated Provider Care Teams.  These Care Teams include your primary Cardiologist (physician) and Advanced Practice Providers (APPs -  Physician Assistants and Nurse Practitioners) who all work together to provide you with the care you need, when you need it.  . You will need a follow up appointment in 12 months   . Providers on your designated Care Team:   . Murray Hodgkins, NP . Christell Faith, PA-C . Marrianne Mood, PA-C  Any Other Special Instructions Will Be Listed Below (If Applicable).  For educational health videos Log in to : [email or web address] Or : [email or web address], password : [email or web address]   How to Take Your Blood Pressure You can take your blood pressure at home with a machine. You may need to check your blood pressure at home:  To check if you have high blood pressure (hypertension).  To check your blood pressure over time.  To make sure your blood pressure medicine is working. Supplies needed: You will need a blood pressure machine, or monitor. You can buy one at a drugstore or online. When choosing one:  Choose one with an arm cuff.  Choose one that wraps around your upper arm. Only one finger should fit between your arm and the cuff.  Do  not choose one that measures your blood pressure from your wrist or finger. Your doctor can suggest a monitor. How to prepare Avoid these things for 30 minutes before checking your blood pressure:  Drinking caffeine.  Drinking alcohol.  Eating.  Smoking.  Exercising. Five minutes before checking your blood pressure:  Pee.  Sit in a dining chair. Avoid sitting in a soft couch or armchair.  Be quiet. Do not talk. How to take your blood pressure Follow the instructions that came with your machine. If you have a digital blood pressure monitor, these may be the instructions: 1. Sit up straight. 2. Place your feet on the floor. Do not cross your ankles or legs. 3. Rest your left arm at the level of your heart. You may rest it on a table, desk, or chair. 4. Pull up your shirt sleeve. 5. Wrap the blood pressure cuff around the upper part of your left arm. The cuff should be 1 inch (2.5 cm) above your elbow. It is best to wrap the cuff around bare skin. 6. Fit the cuff snugly around your arm. You should be able to place only one finger between the cuff and your arm. 7. Put the cord inside the groove of your elbow. 8. Press the power button. 9. Sit quietly while the cuff fills with air and loses air. 10. Write down the numbers on the screen. 11. Wait 2-3 minutes and then repeat steps 1-10. What  do the numbers mean? Two numbers make up your blood pressure. The first number is called systolic pressure. The second is called diastolic pressure. An example of a blood pressure reading is "120 over 80" (or 120/80). If you are an adult and do not have a medical condition, use this guide to find out if your blood pressure is normal: Normal  First number: below 120.  Second number: below 80. Elevated  First number: 120-129.  Second number: below 80. Hypertension stage 1  First number: 130-139.  Second number: 80-89. Hypertension stage 2  First number: 140 or above.  Second number:  55 or above. Your blood pressure is above normal even if only the top or bottom number is above normal. Follow these instructions at home:  Check your blood pressure as often as your doctor tells you to.  Take your monitor to your next doctor's appointment. Your doctor will: ? Make sure you are using it correctly. ? Make sure it is working right.  Make sure you understand what your blood pressure numbers should be.  Tell your doctor if your medicines are causing side effects. Contact a doctor if:  Your blood pressure keeps being high. Get help right away if:  Your first blood pressure number is higher than 180.  Your second blood pressure number is higher than 120. This information is not intended to replace advice given to you by your health care provider. Make sure you discuss any questions you have with your health care provider. Document Revised: 08/04/2017 Document Reviewed: 01/29/2016 Elsevier Patient Education  2020 [employer].

## 2019-11-24 ENCOUNTER — Other Ambulatory Visit: Payer: Self-pay | Admitting: Family Medicine

## 2019-11-24 DIAGNOSIS — I10 Essential (primary) hypertension: Secondary | ICD-10-CM

## 2019-11-27 ENCOUNTER — Other Ambulatory Visit: Payer: Self-pay

## 2019-11-27 ENCOUNTER — Other Ambulatory Visit: Payer: Self-pay | Admitting: Family Medicine

## 2019-11-27 DIAGNOSIS — N951 Menopausal and female climacteric states: Secondary | ICD-10-CM

## 2019-11-27 DIAGNOSIS — E079 Disorder of thyroid, unspecified: Secondary | ICD-10-CM

## 2019-11-27 DIAGNOSIS — I5022 Chronic systolic (congestive) heart failure: Secondary | ICD-10-CM

## 2019-11-27 DIAGNOSIS — A084 Viral intestinal infection, unspecified: Secondary | ICD-10-CM

## 2019-12-01 ENCOUNTER — Other Ambulatory Visit: Payer: Self-pay | Admitting: Family Medicine

## 2019-12-01 DIAGNOSIS — I5022 Chronic systolic (congestive) heart failure: Secondary | ICD-10-CM

## 2019-12-03 NOTE — Telephone Encounter (Signed)
Needs an appointment for additional refills to be sent in.  Hasn't been seen in clinic in over 1 year, all others have been telemedicine visits.

## 2019-12-18 NOTE — Telephone Encounter (Signed)
error 

## 2019-12-19 ENCOUNTER — Encounter: Payer: Commercial Managed Care - PPO | Admitting: Family Medicine

## 2019-12-23 ENCOUNTER — Encounter: Payer: Commercial Managed Care - PPO | Admitting: Family Medicine

## 2019-12-31 ENCOUNTER — Other Ambulatory Visit: Payer: Self-pay | Admitting: Family Medicine

## 2019-12-31 DIAGNOSIS — G43711 Chronic migraine without aura, intractable, with status migrainosus: Secondary | ICD-10-CM

## 2019-12-31 NOTE — Telephone Encounter (Signed)
Requested medications are due for refill today?  Yes  - this refill cannot be delegated.    Requested medications are on active medication list?  Yes  Last Refill:   12/25/2018   # 30 with 3 refills  Future visit scheduled? No   Notes to Clinic:  This medication refill cannot be delegated.

## 2020-01-03 ENCOUNTER — Telehealth: Payer: Self-pay | Admitting: Family Medicine

## 2020-01-03 DIAGNOSIS — I5022 Chronic systolic (congestive) heart failure: Secondary | ICD-10-CM

## 2020-01-03 NOTE — Telephone Encounter (Signed)
Requested  medications are  due for refill today yes  Requested medications are on the active medication list yes  Last refill 3/24  Future visit scheduled no  Notes to clinic failed protocol due to no office visit addressing HF, Has seen Dr Felipa Emory.

## 2020-01-07 ENCOUNTER — Other Ambulatory Visit: Payer: Self-pay

## 2020-01-07 ENCOUNTER — Ambulatory Visit: Payer: Commercial Managed Care - PPO | Attending: Anesthesiology | Admitting: Anesthesiology

## 2020-01-07 ENCOUNTER — Encounter: Payer: Self-pay | Admitting: Anesthesiology

## 2020-01-07 DIAGNOSIS — M25511 Pain in right shoulder: Secondary | ICD-10-CM | POA: Diagnosis not present

## 2020-01-07 DIAGNOSIS — L4050 Arthropathic psoriasis, unspecified: Secondary | ICD-10-CM | POA: Diagnosis not present

## 2020-01-07 DIAGNOSIS — M533 Sacrococcygeal disorders, not elsewhere classified: Secondary | ICD-10-CM

## 2020-01-07 DIAGNOSIS — M25552 Pain in left hip: Secondary | ICD-10-CM

## 2020-01-07 DIAGNOSIS — M25549 Pain in joints of unspecified hand: Secondary | ICD-10-CM

## 2020-01-07 DIAGNOSIS — M5442 Lumbago with sciatica, left side: Secondary | ICD-10-CM | POA: Diagnosis not present

## 2020-01-07 DIAGNOSIS — M25512 Pain in left shoulder: Secondary | ICD-10-CM

## 2020-01-07 DIAGNOSIS — G894 Chronic pain syndrome: Secondary | ICD-10-CM

## 2020-01-07 DIAGNOSIS — M25532 Pain in left wrist: Secondary | ICD-10-CM

## 2020-01-07 DIAGNOSIS — F1190 Opioid use, unspecified, uncomplicated: Secondary | ICD-10-CM

## 2020-01-07 DIAGNOSIS — G8929 Other chronic pain: Secondary | ICD-10-CM

## 2020-01-07 DIAGNOSIS — M5441 Lumbago with sciatica, right side: Secondary | ICD-10-CM

## 2020-01-07 MED ORDER — OXYCODONE-ACETAMINOPHEN 10-325 MG PO TABS: 1.0000 | ORAL_TABLET | Freq: Four times a day (QID) | ORAL | 0 refills | Status: AC | PRN

## 2020-01-07 NOTE — Progress Notes (Signed)
Virtual Visit via Telephone Note  I connected with Krista Kennedy on 01/07/20 at  1:45 PM EDT by telephone and verified that I am speaking with the correct person using two identifiers.  Location: Patient: Home Provider: Pain control center   I discussed the limitations, risks, security and privacy concerns of performing an evaluation and management service by telephone and the availability of in person appointments. I also discussed with the patient that there may be a patient responsible charge related to this service. The patient expressed understanding and agreed to proceed.   History of Present Illness: I spoke with Krista Kennedy today via telephone for her virtual appointment as she was unable to do the video portion.  She reports that her pain in the low back and diffuse body pain is similar to what she has been experiencing.  No significant changes are reported today.  She is taking her medications as prescribed and these continue to work well for her with no side effects reported.  She derives good functional benefit from the medications and has failed more conservative therapy.  Overall she feels that her pain is being well managed with this medicine regimen and she reports no untoward side effects.    Observations/Objective:  Current Outpatient Medications:  .  Ascorbic Acid (VITAMIN C) 1000 MG tablet, Take 1,000 mg by mouth daily., Disp: , Rfl:  .  baclofen (LIORESAL) 10 MG tablet, Take 1 tablet (10 mg total) by mouth 2 (two) times daily as needed (muscle tension 2/2 migraine)., Disp: 30 each, Rfl: 3 .  betamethasone dipropionate (DIPROLENE) 0.05 % ointment, daily. , Disp: , Rfl: 0 .  busPIRone (BUSPAR) 5 MG tablet, , Disp: , Rfl:  .  carvedilol (COREG) 25 MG tablet, TAKE 1 TABLET BY MOUTH TWICE DAILY WITH MEALS, Disp: 60 tablet, Rfl: 0 .  Cholecalciferol (VITAMIN D-1000 MAX ST) 1000 UNITS tablet, Take by mouth daily. , Disp: , Rfl:  .  clobetasol (TEMOVATE) 0.05 % external  solution, Apply 1 application topically as needed. , Disp: , Rfl:  .  estradiol (CLIMARA - DOSED IN MG/24 HR) 0.025 mg/24hr patch, Place 1 patch (0.025 mg total) onto the skin once a week., Disp: 12 patch, Rfl: 0 .  fluocinonide (LIDEX) 0.05 % external solution, Apply 1 application topically daily. , Disp: , Rfl: 0 .  fluticasone (FLONASE) 50 MCG/ACT nasal spray, Place 2 sprays into both nostrils daily., Disp: 16 g, Rfl: 3 .  ipratropium (ATROVENT) 0.06 % nasal spray, Place 2 sprays into both nostrils 4 (four) times daily. For up to 5-7 days then stop. (Patient taking differently: Place 2 sprays into both nostrils as needed. For up to 5-7 days then stop.), Disp: 15 mL, Rfl: 0 .  Ixekizumab (TALTZ) 80 MG/ML SOSY, Inject 80 mg into the skin as directed., Disp: , Rfl:  .  levothyroxine (SYNTHROID) 100 MCG tablet, Take 1 tablet (100 mcg total) by mouth daily before breakfast., Disp: 90 tablet, Rfl: 0 .  lisinopril (ZESTRIL) 20 MG tablet, TAKE 1 TABLET(20 MG) BY MOUTH DAILY, Disp: 90 tablet, Rfl: 0 .  metFORMIN (GLUCOPHAGE) 500 MG tablet, Take 1 tablet (500 mg total) by mouth 2 (two) times daily with a meal., Disp: 180 tablet, Rfl: 3 .  naloxone (NARCAN) nasal spray 4 mg/0.1 mL, To reverse excess sedation from narcotics, Disp: 2 kit, Rfl: 1 .  ondansetron (ZOFRAN) 4 MG tablet, Take 1 tablet (4 mg total) by mouth every 8 (eight) hours as needed for nausea or vomiting.,  Disp: 20 tablet, Rfl: 0 .  [START ON 01/08/2020] oxyCODONE-acetaminophen (PERCOCET) 10-325 MG tablet, Take 1 tablet by mouth every 6 (six) hours as needed for pain., Disp: 120 tablet, Rfl: 0 .  [START ON 02/07/2020] oxyCODONE-acetaminophen (PERCOCET) 10-325 MG tablet, Take 1 tablet by mouth every 6 (six) hours as needed for pain., Disp: 120 tablet, Rfl: 0 .  progesterone (PROMETRIUM) 100 MG capsule, TAKE 1 CAPSULE(100 MG) BY MOUTH DAILY, Disp: 90 capsule, Rfl: 0 .  spironolactone (ALDACTONE) 25 MG tablet, Take 1 tablet (25 mg total) by mouth  daily., Disp: 90 tablet, Rfl: 3 .  SUMAtriptan (IMITREX) 5 MG/ACT nasal spray, PLACE 1 SPRAY INTO THE NOSE EVERY 2 HOURS AS NEEDED FOR MIGRAINE, Disp: 1 Inhaler, Rfl: 1 .  TALTZ 80 MG/ML SOAJ, , Disp: , Rfl:  .  venlafaxine XR (EFFEXOR-XR) 150 MG 24 hr capsule, TAKE 1 CAPSULE(150 MG) BY MOUTH DAILY WITH BREAKFAST, Disp: 90 capsule, Rfl: 0  Assessment and Plan:  1. Psoriasis with arthropathy ([place])   2. Chronic pain of both shoulders   3. Pain in multiple finger joints   4. Chronic bilateral low back pain with bilateral sciatica   5. Chronic pain syndrome   6. Chronic, continuous use of opioids   7. Chronic left hip pain   8. Left wrist pain   9. Chronic sacroiliac joint pain   Based on our discussion today and upon review of the [place] practitioner database information going to refill her medications for the next 2 months for dating on May 5 in June 4.  I have requested that she present for evaluation and urine drug screen for routine monitoring.  Continue with stretching strengthening exercises as prescribed and follow-up with her primary care physicians for baseline medical care.   Follow Up Instructions:    I discussed the assessment and treatment plan with the patient. The patient was provided an opportunity to ask questions and all were answered. The patient agreed with the plan and demonstrated an understanding of the instructions.   The patient was advised to call back or seek an in-person evaluation if the symptoms worsen or if the condition fails to improve as anticipated.  I provided 25 minutes of non-face-to-face time during this encounter.   Molli Barrows, MD

## 2020-01-20 ENCOUNTER — Other Ambulatory Visit: Payer: Self-pay | Admitting: Family Medicine

## 2020-01-20 DIAGNOSIS — E079 Disorder of thyroid, unspecified: Secondary | ICD-10-CM

## 2020-01-24 ENCOUNTER — Encounter: Payer: Self-pay | Admitting: Family Medicine

## 2020-02-12 ENCOUNTER — Other Ambulatory Visit: Payer: Self-pay | Admitting: Family Medicine

## 2020-02-12 NOTE — Telephone Encounter (Signed)
Pt stated she has a refill at [facility] for  venlafaxine XR (EFFEXOR-XR) 150 MG 24 hr capsule But it needs to be a 90 day supply for them to fill it without insurance/ Pt states she would like it sent today due to being out of medication/ please advise

## 2020-02-13 ENCOUNTER — Telehealth: Payer: Self-pay

## 2020-02-13 NOTE — Telephone Encounter (Signed)
I left a detail message on the patient vm that we are not able to fill her prescription she requested.

## 2020-02-13 NOTE — Telephone Encounter (Signed)
Patient has been discharged from our practice and can only be seen for urgent needs.  Medication refills will not be refilled.

## 2020-02-13 NOTE — Telephone Encounter (Signed)
I called the patient and left on her personal voicemail that due to her being discharged from the practice we will be available only for urgent care for 30 days from the date on this letter. We have to cancel her physical appt scheduled for tomorrow because of this.

## 2020-02-14 ENCOUNTER — Encounter: Payer: Self-pay | Admitting: Family Medicine

## 2020-02-19 ENCOUNTER — Ambulatory Visit: Payer: Commercial Managed Care - PPO | Admitting: Family Medicine

## 2020-02-20 ENCOUNTER — Ambulatory Visit: Payer: Commercial Managed Care - PPO | Admitting: Family Medicine

## 2020-02-26 ENCOUNTER — Other Ambulatory Visit: Payer: Self-pay

## 2020-02-26 ENCOUNTER — Telehealth: Payer: Self-pay | Admitting: Cardiovascular Disease

## 2020-02-26 DIAGNOSIS — I5022 Chronic systolic (congestive) heart failure: Secondary | ICD-10-CM

## 2020-02-26 MED ORDER — CARVEDILOL 25 MG PO TABS: 25.0000 mg | ORAL_TABLET | Freq: Two times a day (BID) | ORAL | 0 refills | Status: DC

## 2020-02-26 NOTE — Telephone Encounter (Signed)
carvedilol (COREG) 25 MG tablet 180 tablet 0 02/26/2020    Sig - Route: Take 1 tablet (25 mg total) by mouth 2 (two) times daily with a meal. - Oral   Sent to pharmacy as: carvedilol (COREG) 25 MG tablet   E-Prescribing Status: Receipt confirmed by pharmacy (02/26/2020 11:36 AM EDT)   Associated Diagnoses  Chronic systolic congestive heart failure [facility])     Pharmacy  [place] [place] [address] AT [place]

## 2020-02-26 NOTE — Telephone Encounter (Signed)
*  STAT* If patient is at the pharmacy, call can be transferred to refill team.   1. Which medications need to be refilled? (please list name of each medication and dose if known) carvedilol 25 bid  2. Which pharmacy/location (including street and city if local pharmacy) is medication to be sent to? [facility] in [place]  3. Do they need a 30 day or 90 day supply? [place]

## 2020-03-01 ENCOUNTER — Other Ambulatory Visit: Payer: Self-pay | Admitting: Family Medicine

## 2020-03-01 DIAGNOSIS — F419 Anxiety disorder, unspecified: Secondary | ICD-10-CM

## 2020-03-01 DIAGNOSIS — F3341 Major depressive disorder, recurrent, in partial remission: Secondary | ICD-10-CM

## 2020-03-03 ENCOUNTER — Other Ambulatory Visit: Payer: Self-pay | Admitting: Family Medicine

## 2020-03-03 DIAGNOSIS — I10 Essential (primary) hypertension: Secondary | ICD-10-CM

## 2020-03-06 ENCOUNTER — Emergency Department: Payer: Commercial Managed Care - PPO

## 2020-03-06 ENCOUNTER — Other Ambulatory Visit: Payer: Self-pay

## 2020-03-06 ENCOUNTER — Emergency Department
Admission: EM | Admit: 2020-03-06 | Discharge: 2020-03-06 | Disposition: A | Discharge status: Home / Self Care | Payer: Commercial Managed Care - PPO | Attending: Emergency Medicine | Admitting: Emergency Medicine

## 2020-03-06 DIAGNOSIS — R0602 Shortness of breath: Secondary | ICD-10-CM | POA: Insufficient documentation

## 2020-03-06 DIAGNOSIS — Z5321 Procedure and treatment not carried out due to patient leaving prior to being seen by health care provider: Secondary | ICD-10-CM | POA: Insufficient documentation

## 2020-03-06 DIAGNOSIS — R079 Chest pain, unspecified: Secondary | ICD-10-CM | POA: Diagnosis present

## 2020-03-06 LAB — BASIC METABOLIC PANEL
Anion gap: 11 (ref 5–15)
BUN: 16 mg/dL (ref 6–20)
CO2: 28 mmol/L (ref 22–32)
Calcium: 9.7 mg/dL (ref 8.9–10.3)
Chloride: 102 mmol/L (ref 98–111)
Creatinine, Ser: 0.92 mg/dL (ref 0.44–1.00)
GFR calc Af Amer: >60 mL/min (ref >60)
GFR calc non Af Amer: >60 mL/min (ref >60)
Glucose, Bld: 140 mg/dL — ABNORMAL HIGH (ref 70–99)
Potassium: 4.3 mmol/L (ref 3.5–5.1)
Sodium: 141 mmol/L (ref 135–145)

## 2020-03-06 LAB — CBC
HCT: 39.1 % (ref 36.0–46.0)
Hemoglobin: 13.3 g/dL (ref 12.0–15.0)
MCH: 30 pg (ref 26.0–34.0)
MCHC: 34 g/dL (ref 30.0–36.0)
MCV: 88.3 fL (ref 80.0–100.0)
Platelets: 250 10*3/uL (ref 150–400)
RBC: 4.43 MIL/uL (ref 3.87–5.11)
RDW: 12.2 % (ref 11.5–15.5)
WBC: 8.7 10*3/uL (ref 4.0–10.5)
nRBC: 0 % (ref 0.0–0.2)

## 2020-03-06 LAB — TROPONIN I (HIGH SENSITIVITY): Troponin I (High Sensitivity): 5 ng/L (ref <18)

## 2020-03-06 MED ORDER — SODIUM CHLORIDE 0.9% FLUSH: 3.0000 mL | Freq: Once | INTRAVENOUS | Status: DC

## 2020-03-06 NOTE — ED Triage Notes (Signed)
C?O chest pain and SOB.  Onset this morning.  AAOx3.  Skin warm and dry. NAD

## 2020-03-06 NOTE — ED Notes (Signed)
Called pt several times, walked around waiting room and outside no answer

## 2020-03-06 NOTE — ED Notes (Signed)
Patient is outside  

## 2020-03-06 NOTE — ED Notes (Signed)
Called to update pt vital signs, no answer. Pt not visualized in waiting area

## 2020-03-06 NOTE — ED Notes (Signed)
Pt not visualized outside

## 2020-03-10 ENCOUNTER — Ambulatory Visit: Payer: Commercial Managed Care - PPO | Attending: Anesthesiology | Admitting: Anesthesiology

## 2020-03-10 ENCOUNTER — Encounter: Payer: Self-pay | Admitting: Anesthesiology

## 2020-03-10 ENCOUNTER — Other Ambulatory Visit: Payer: Self-pay

## 2020-03-10 DIAGNOSIS — G8929 Other chronic pain: Secondary | ICD-10-CM

## 2020-03-10 DIAGNOSIS — M25511 Pain in right shoulder: Secondary | ICD-10-CM | POA: Diagnosis not present

## 2020-03-10 DIAGNOSIS — F1190 Opioid use, unspecified, uncomplicated: Secondary | ICD-10-CM

## 2020-03-10 DIAGNOSIS — M25512 Pain in left shoulder: Secondary | ICD-10-CM

## 2020-03-10 DIAGNOSIS — M25549 Pain in joints of unspecified hand: Secondary | ICD-10-CM

## 2020-03-10 DIAGNOSIS — L4050 Arthropathic psoriasis, unspecified: Secondary | ICD-10-CM

## 2020-03-10 DIAGNOSIS — G894 Chronic pain syndrome: Secondary | ICD-10-CM

## 2020-03-10 DIAGNOSIS — M5442 Lumbago with sciatica, left side: Secondary | ICD-10-CM

## 2020-03-10 DIAGNOSIS — M25532 Pain in left wrist: Secondary | ICD-10-CM

## 2020-03-10 DIAGNOSIS — M533 Sacrococcygeal disorders, not elsewhere classified: Secondary | ICD-10-CM

## 2020-03-10 DIAGNOSIS — M25552 Pain in left hip: Secondary | ICD-10-CM

## 2020-03-10 DIAGNOSIS — M5441 Lumbago with sciatica, right side: Secondary | ICD-10-CM

## 2020-03-10 MED ORDER — OXYCODONE-ACETAMINOPHEN 10-325 MG PO TABS: 1.0000 | ORAL_TABLET | Freq: Four times a day (QID) | ORAL | 0 refills | Status: DC | PRN

## 2020-03-10 NOTE — Progress Notes (Signed)
Virtual Visit via Video Note  I connected with Krista Kennedy on 03/10/20 at  2:00 PM EDT by a video enabled telemedicine application and verified that I am speaking with the correct person using two identifiers.  Location: Patient: Home Provider: Pain control center   I discussed the limitations of evaluation and management by telemedicine and the availability of in person appointments. The patient expressed understanding and agreed to proceed.  History of Present Illness: I spoke with Krista Kennedy via video conferencing for her virtual appointment.  She reports that her diffuse body pain and low back pain has been stable.  She is taking her medications as prescribed and these continue to work well for her.  No significant changes are noted in the quality characteristic or distribution of her pain.  She denies any side effects with the medications and reports that they are continue to give her good relief.  Unfortunately she is currently going through a divorce with her husband which is further complicating her overall pain management and home life.  She mentions that she is managing this reasonably well.    Observations/Objective:  Current Outpatient Medications:  .  Ascorbic Acid (VITAMIN C) 1000 MG tablet, Take 1,000 mg by mouth daily., Disp: , Rfl:  .  baclofen (LIORESAL) 10 MG tablet, Take 1 tablet (10 mg total) by mouth 2 (two) times daily as needed (muscle tension 2/2 migraine)., Disp: 30 each, Rfl: 3 .  betamethasone dipropionate (DIPROLENE) 0.05 % ointment, daily. , Disp: , Rfl: 0 .  busPIRone (BUSPAR) 5 MG tablet, , Disp: , Rfl:  .  carvedilol (COREG) 25 MG tablet, Take 1 tablet (25 mg total) by mouth 2 (two) times daily with a meal., Disp: 180 tablet, Rfl: 0 .  Cholecalciferol (VITAMIN D-1000 MAX ST) 1000 UNITS tablet, Take by mouth daily. , Disp: , Rfl:  .  clobetasol (TEMOVATE) 0.05 % external solution, Apply 1 application topically as needed. , Disp: , Rfl:  .  estradiol (CLIMARA -  DOSED IN MG/24 HR) 0.025 mg/24hr patch, Place 1 patch (0.025 mg total) onto the skin once a week., Disp: 12 patch, Rfl: 0 .  fluocinonide (LIDEX) 0.05 % external solution, Apply 1 application topically daily. , Disp: , Rfl: 0 .  fluticasone (FLONASE) 50 MCG/ACT nasal spray, Place 2 sprays into both nostrils daily., Disp: 16 g, Rfl: 3 .  ipratropium (ATROVENT) 0.06 % nasal spray, Place 2 sprays into both nostrils 4 (four) times daily. For up to 5-7 days then stop. (Patient taking differently: Place 2 sprays into both nostrils as needed. For up to 5-7 days then stop.), Disp: 15 mL, Rfl: 0 .  Ixekizumab (TALTZ) 80 MG/ML SOSY, Inject 80 mg into the skin as directed., Disp: , Rfl:  .  levothyroxine (SYNTHROID) 100 MCG tablet, Take 1 tablet (100 mcg total) by mouth daily before breakfast., Disp: 90 tablet, Rfl: 0 .  lisinopril (ZESTRIL) 20 MG tablet, TAKE 1 TABLET(20 MG) BY MOUTH DAILY, Disp: 90 tablet, Rfl: 0 .  metFORMIN (GLUCOPHAGE) 500 MG tablet, Take 1 tablet (500 mg total) by mouth 2 (two) times daily with a meal., Disp: 180 tablet, Rfl: 3 .  naloxone (NARCAN) nasal spray 4 mg/0.1 mL, To reverse excess sedation from narcotics, Disp: 2 kit, Rfl: 1 .  ondansetron (ZOFRAN) 4 MG tablet, Take 1 tablet (4 mg total) by mouth every 8 (eight) hours as needed for nausea or vomiting., Disp: 20 tablet, Rfl: 0 .  oxyCODONE-acetaminophen (PERCOCET) 10-325 MG tablet, Take 1 tablet  by mouth every 6 (six) hours as needed for pain., Disp: 120 tablet, Rfl: 0 .  progesterone (PROMETRIUM) 100 MG capsule, TAKE 1 CAPSULE(100 MG) BY MOUTH DAILY, Disp: 90 capsule, Rfl: 0 .  spironolactone (ALDACTONE) 25 MG tablet, Take 1 tablet (25 mg total) by mouth daily., Disp: 90 tablet, Rfl: 3 .  SUMAtriptan (IMITREX) 5 MG/ACT nasal spray, PLACE 1 SPRAY INTO THE NOSE EVERY 2 HOURS AS NEEDED FOR MIGRAINE, Disp: 1 Inhaler, Rfl: 1 .  TALTZ 80 MG/ML SOAJ, , Disp: , Rfl:  .  venlafaxine XR (EFFEXOR-XR) 150 MG 24 hr capsule, TAKE 1 CAPSULE(150  MG) BY MOUTH DAILY WITH BREAKFAST, Disp: 90 capsule, Rfl: 0 Assessment and Plan: 1. Psoriasis with arthropathy ([place])   2. Chronic pain of both shoulders   3. Pain in multiple finger joints   4. Chronic bilateral low back pain with bilateral sciatica   5. Chronic pain syndrome   6. Chronic, continuous use of opioids   7. Chronic left hip pain   8. Left wrist pain   9. Chronic sacroiliac joint pain   10. Psoriatic arthritis ([place])   Based on our discussion today and upon review of the [facility] practitioner database information going to refill her medications for the next month.  I will schedule her for return to clinic for 1 month reevaluation.  This will be earlier than her standard 2 months.  In the meantime I have requested that she present for a urine drug screen for routine monitoring.  I want her to continue efforts at weight loss stretching strengthening as previously reviewed.  Continue follow-up with her primary care physicians.  I did talk to her about some coping mechanisms with the added stress and some potential resources if she should have the need for additional assistance.  Follow Up Instructions:    I discussed the assessment and treatment plan with the patient. The patient was provided an opportunity to ask questions and all were answered. The patient agreed with the plan and demonstrated an understanding of the instructions.   The patient was advised to call back or seek an in-person evaluation if the symptoms worsen or if the condition fails to improve as anticipated.  I provided 25 minutes of non-face-to-face time during this encounter.   Molli Barrows, MD

## 2020-03-27 ENCOUNTER — Other Ambulatory Visit: Payer: Self-pay | Admitting: Nurse Practitioner

## 2020-03-27 DIAGNOSIS — E079 Disorder of thyroid, unspecified: Secondary | ICD-10-CM

## 2020-03-29 ENCOUNTER — Other Ambulatory Visit: Payer: Self-pay | Admitting: Family Medicine

## 2020-03-29 DIAGNOSIS — I10 Essential (primary) hypertension: Secondary | ICD-10-CM

## 2020-03-30 ENCOUNTER — Telehealth: Payer: Self-pay | Admitting: Anesthesiology

## 2020-03-30 NOTE — Telephone Encounter (Signed)
Left voicemail stating that I was returning her message from over the weekend and to return our call if she still needed [place].

## 2020-04-02 ENCOUNTER — Other Ambulatory Visit: Payer: Self-pay

## 2020-04-02 ENCOUNTER — Encounter: Payer: Self-pay | Admitting: Family Medicine

## 2020-04-02 ENCOUNTER — Ambulatory Visit: Payer: Commercial Managed Care - PPO | Admitting: Family Medicine

## 2020-04-02 VITALS — BP 124/80 | HR 68 | Ht 70.0 in | Wt 167.0 lb

## 2020-04-02 DIAGNOSIS — Z7689 Persons encountering health services in other specified circumstances: Secondary | ICD-10-CM | POA: Diagnosis not present

## 2020-04-02 DIAGNOSIS — R110 Nausea: Secondary | ICD-10-CM | POA: Diagnosis not present

## 2020-04-02 DIAGNOSIS — Z8679 Personal history of other diseases of the circulatory system: Secondary | ICD-10-CM

## 2020-04-02 DIAGNOSIS — Z8639 Personal history of other endocrine, nutritional and metabolic disease: Secondary | ICD-10-CM

## 2020-04-02 DIAGNOSIS — Z8742 Personal history of other diseases of the female genital tract: Secondary | ICD-10-CM

## 2020-04-02 DIAGNOSIS — N951 Menopausal and female climacteric states: Secondary | ICD-10-CM | POA: Diagnosis not present

## 2020-04-02 DIAGNOSIS — Z8669 Personal history of other diseases of the nervous system and sense organs: Secondary | ICD-10-CM

## 2020-04-02 DIAGNOSIS — Z1211 Encounter for screening for malignant neoplasm of colon: Secondary | ICD-10-CM

## 2020-04-02 DIAGNOSIS — Z8659 Personal history of other mental and behavioral disorders: Secondary | ICD-10-CM

## 2020-04-02 MED ORDER — PANTOPRAZOLE SODIUM 40 MG PO TBEC: 40.0000 mg | DELAYED_RELEASE_TABLET | Freq: Every day | ORAL | 3 refills | Status: DC

## 2020-04-02 NOTE — Progress Notes (Addendum)
Date:  04/02/2020   Name:  Krista Kennedy   DOB:  [birth year]   MRN:  [id number]   Chief Complaint: Establish Care  Patient is a 54 year old female who presents for an establish care exam. The patient reports the following problems: vasomotor changes. Health maintenance has been reviewed surveillence AS CUS/positive HPV.   Lab Results  Component Value Date   CREATININE 0.92 03/06/2020   BUN 16 03/06/2020   NA 141 03/06/2020   K 4.3 03/06/2020   CL 102 03/06/2020   CO2 28 03/06/2020   Lab Results  Component Value Date   CHOL 194 07/20/2018   HDL 41 (L) 07/20/2018   LDLCALC 129 (H) 07/20/2018   TRIG 128 07/20/2018   CHOLHDL 4.7 07/20/2018   Lab Results  Component Value Date   TSH 0.03 (L) 07/20/2018   Lab Results  Component Value Date   HGBA1C 6.7 (H) 07/20/2018   Lab Results  Component Value Date   WBC 8.7 03/06/2020   HGB 13.3 03/06/2020   HCT 39.1 03/06/2020   MCV 88.3 03/06/2020   PLT 250 03/06/2020   Lab Results  Component Value Date   ALT 22 07/20/2018   AST 19 07/20/2018   ALKPHOS 71 04/11/2017   BILITOT 0.6 07/20/2018     Review of Systems  Constitutional: Negative.  Negative for chills, fatigue, fever and unexpected weight change.  HENT: Negative for congestion, ear discharge, ear pain, rhinorrhea, sinus pressure, sneezing and sore throat.   Eyes: Negative for photophobia, pain, discharge, redness and itching.  Respiratory: Negative for cough, shortness of breath, wheezing and stridor.   Cardiovascular: Negative for chest pain, palpitations and leg swelling.  Gastrointestinal: Negative for abdominal pain, blood in stool, constipation, diarrhea, nausea and vomiting.  Endocrine: Negative for cold intolerance, heat intolerance, polydipsia, polyphagia and polyuria.  Genitourinary: Negative for dysuria, flank pain, frequency, hematuria, menstrual problem, pelvic pain, urgency, vaginal bleeding and vaginal discharge.  Musculoskeletal: Negative for  arthralgias, back pain and myalgias.  Skin: Negative for rash.  Allergic/Immunologic: Negative for environmental allergies and food allergies.  Neurological: Negative for dizziness, weakness, light-headedness, numbness and headaches.  Hematological: Negative for adenopathy. Does not bruise/bleed easily.  Psychiatric/Behavioral: Negative for dysphoric mood. The patient is not nervous/anxious.     Patient Active Problem List   Diagnosis Date Noted  . Transient weakness of left leg 08/13/2019  . Recurrent major depressive disorder, in partial remission ([place]) 06/26/2019  . Chronic, continuous use of opioids 02/19/2019  . History of cardiomyopathy 08/21/2018  . Shortness of breath 08/21/2018  . Palpitations 08/21/2018  . Chronic shoulder pain 06/14/2017  . Encounter for long-term (current) use of other medications 05/24/2017  . Left wrist pain 04/11/2017  . Chronic sacroiliac joint pain 04/11/2017  . Hyperlipidemia 03/15/2017  . Elevated hemoglobin A1c 03/15/2017  . Therapeutic opioid induced constipation 02/02/2017  . Chronic pain syndrome 04/07/2016  . Chronic hip pain 04/07/2016  . Pain in multiple finger joints 04/07/2016  . Allergic rhinitis due to pollen 09/08/2015  . Other long term (current) drug therapy 08/13/2015  . Depression 08/06/2015  . Opioid dependence, daily use ([place]) 08/06/2015  . High risk medications (not anticoagulants) long-term use 02/11/2015  . Dysmenorrhea 10/29/2013  . Congestive heart failure ([place]) 10/29/2013  . Menorrhagia 10/15/2013  . Fibroid 10/10/2013  . Hyperglycemia 08/22/2013  . Hypertension 04/30/2013  . Psoriasis 04/30/2013  . Chronic low back pain 11/02/2011  . GERD (gastroesophageal reflux disease) 07/12/2010  . Hypothyroidism 07/12/2010  .  Psoriasis with arthropathy ([place]) 07/12/2010  . Avitaminosis D 07/12/2010  . Abnormal mammogram 07/12/2010  . Breast screening 07/12/2010  . Encounter for routine gynecological examination 07/12/2010    . Insomnia 07/12/2010  . Vitamin D deficiency 07/12/2010    Allergies  Allergen Reactions  . Other     Hair dye   . Sulfa Antibiotics Hives    Rash   . Sulfacetamide Sodium     Rash    Past Surgical History:  Procedure Laterality Date  . ENDOMETRIAL ABLATION  2015  . THYROIDECTOMY    . TONSILLECTOMY    . TUBAL LIGATION      Social History   Tobacco Use  . Smoking status: Former Smoker    Packs/day: 0.25    Years: 15.00    Pack years: 3.75    Types: Cigarettes  . Smokeless tobacco: Former Network engineer  . Vaping Use: Every day  . Substances: Flavoring  Substance Use Topics  . Alcohol use: Yes    Comment: social  . Drug use: No     Medication list has been reviewed and updated.  Current Meds  Medication Sig  . Ascorbic Acid (VITAMIN C) 1000 MG tablet Take 1,000 mg by mouth daily.  . carvedilol (COREG) 25 MG tablet Take 1 tablet (25 mg total) by mouth 2 (two) times daily with a meal.  . Cholecalciferol (VITAMIN D-1000 MAX ST) 1000 UNITS tablet Take by mouth daily.   . clobetasol (TEMOVATE) 0.05 % external solution Apply 1 application topically as needed.   . fluticasone (FLONASE) 50 MCG/ACT nasal spray Place 2 sprays into both nostrils daily.  . hydrOXYzine (ATARAX/VISTARIL) 10 MG tablet   . Ixekizumab (TALTZ) 80 MG/ML SOSY Inject 80 mg into the skin as directed. [facility]  . levothyroxine (SYNTHROID) 100 MCG tablet Take 1 tablet (100 mcg total) by mouth daily before breakfast.  . lisinopril (ZESTRIL) 20 MG tablet TAKE 1 TABLET(20 MG) BY MOUTH DAILY  . Multiple Vitamin (MULTI VITAMIN PO) Take 1 tablet by mouth daily.  . naloxone (NARCAN) nasal spray 4 mg/0.1 mL To reverse excess sedation from narcotics  . ondansetron (ZOFRAN) 4 MG tablet Take 1 tablet (4 mg total) by mouth every 8 (eight) hours as needed for nausea or vomiting.  Marland Kitchen oxyCODONE-acetaminophen (PERCOCET) 10-325 MG tablet Take 1 tablet by mouth every 6 (six) hours as needed for pain. (Patient  taking differently: Take 1 tablet by mouth every 6 (six) hours as needed for pain. Dr. [facility])  . progesterone (PROMETRIUM) 100 MG capsule TAKE 1 CAPSULE(100 MG) BY MOUTH DAILY  . spironolactone (ALDACTONE) 25 MG tablet Take 1 tablet (25 mg total) by mouth daily.  . SUMAtriptan (IMITREX) 5 MG/ACT nasal spray PLACE 1 SPRAY INTO THE NOSE EVERY 2 HOURS AS NEEDED FOR MIGRAINE    PHQ 2/9 Scores 04/02/2020 10/09/2019 06/26/2019 03/05/2019  PHQ - 2 Score 2 0 4 1  PHQ- 9 Score 7 0 14 4  Exception Documentation - - - -    GAD 7 : Generalized Anxiety Score 04/02/2020 06/26/2019  Nervous, Anxious, on Edge 1 3  Control/stop worrying 1 3  Worry too much - different things 1 0  Trouble relaxing 1 3  Restless 0 0  Easily annoyed or irritable 1 0  Afraid - awful might happen 0 0  Total GAD 7 Score 5 9  Anxiety Difficulty Somewhat difficult Very difficult    BP Readings from Last 3 Encounters:  04/02/20 124/80  11/18/19 110/60  09/12/19 (!) 144/80    Physical Exam Vitals and nursing note reviewed.  Constitutional:      Appearance: She is well-developed.  HENT:     Head: Normocephalic.     Right Ear: Tympanic membrane, ear canal and external ear normal. There is no impacted cerumen.     Left Ear: Tympanic membrane, ear canal and external ear normal. There is no impacted cerumen.     Nose: Nose normal.     Mouth/Throat:     Lips: Pink.     Mouth: Mucous membranes are moist.     Palate: No mass and lesions.     Pharynx: Oropharynx is clear.  Eyes:     General: Lids are everted, no foreign bodies appreciated. No scleral icterus.       Left eye: No foreign body or hordeolum.     Conjunctiva/sclera: Conjunctivae normal.     Right eye: Right conjunctiva is not injected.     Left eye: Left conjunctiva is not injected.     Pupils: Pupils are equal, round, and reactive to light.  Neck:     Thyroid: No thyromegaly.     Vascular: No JVD.     Trachea: No tracheal deviation.    Cardiovascular:     Rate and Rhythm: Normal rate and regular rhythm.     Heart sounds: S1 normal and S2 normal. No murmur heard.  No systolic murmur is present.  No diastolic murmur is present.  No friction rub. No gallop. No S3 or S4 sounds.   Pulmonary:     Effort: Pulmonary effort is normal. No respiratory distress.     Breath sounds: Normal breath sounds. No decreased breath sounds, wheezing, rhonchi or rales.  Abdominal:     General: Bowel sounds are normal.     Palpations: Abdomen is soft. There is no mass.     Tenderness: There is no abdominal tenderness. There is no guarding or rebound.  Musculoskeletal:        General: No tenderness. Normal range of motion.     Cervical back: Normal range of motion and neck supple.  Lymphadenopathy:     Cervical: No cervical adenopathy.  Skin:    General: Skin is warm.     Findings: No rash.  Neurological:     Mental Status: She is alert and oriented to person, place, and time.     Cranial Nerves: No cranial nerve deficit.  Psychiatric:        Mood and Affect: Mood is not anxious or depressed.     Wt Readings from Last 3 Encounters:  04/02/20 167 lb (75.8 kg)  11/18/19 173 lb (78.5 kg)  09/12/19 169 lb 4 oz (76.8 kg)    BP 124/80   Pulse 68   Ht 5\' 10"  (1.778 m)   Wt 167 lb (75.8 kg)   BMI 23.96 kg/m   Assessment and Plan: 1. Establishing care with new doctor, encounter for Patient establishing care with new physician.  Previous was a patient of [place] for which he now needs to find a new physician because of difficulty making appointments.  Patient's chart was reviewed for previous encounters, medications, recent labs, and care everywhere.  2. History of abnormal cervical Pap smear Patient with history of abnormal Pap smear ASCUS/uterine fibroids/ablation/HPV/and vasomotor symptoms.  Patient also has not progressed with having a mammogram in years.  Patient will be referred to gynecology for surveillance of abnormal  Pap smear with history of  positive HPV and possibility of colposcopy was discussed with patient.  It was also discussed with patient that we are unable to continue her HRT which is initially prescribed for a short-term basis for vasomotor to counter her anxiety and depression.  Patient was explained that HRT with estradiol and progesterone puts her at risk for thrombogenic concerns and that this would not be a long-term decision and alternative therapy was likely to be considered at her age and continuance of vaping.  Patient was referred to GYN for evaluation of these concerns. - Ambulatory referral to Gynecology  3. Menopausal vasomotor syndrome As noted above patient was seen by Rocco Serene for vasomotor symptoms.  HRT was started as a short-term answer since she had anxiety and depression and that venlafaxine buspirone and hydroxyzine were initiated see below.  Alternative therapy was to be considered in 2 to 3 months for which apparently she did not return for her recheck appointment.  Patient has been out of her estradiol patches and progesterone for some time and I explained to her that this would be a GYN decision whether this would be continued given her age and continuance of vaping. - Ambulatory referral to Gynecology  4. Chronic nausea Patient has a history of chronic nausea for which she has taken Zofran in the past and she would like to switch to Phenergan.  I explained to her that I would like to investigate the why of her nausea.  Initially I would like to put her on a PPI for reduction of acid in the face of possible gastritis and/or PUD.  Pantoprazole 40 mg once a day was initiated and referral to GYN was such to include colonoscopy as well - pantoprazole (PROTONIX) 40 MG tablet; Take 1 tablet (40 mg total) by mouth daily.  Dispense: 30 tablet; Refill: 3 - Ambulatory referral to Gastroenterology  5. Colon cancer screening Discussed with patient about colon cancer surveillance and  referral was made to GYN for colonoscopy. - Ambulatory referral to Gastroenterology  6. History of hypothyroidism Patient with history of hypothyroid for which she is on thyroid supplementation.  It was explained to patient that we would likely be able to continue this unless she preferred to be on [place] or patient develops a history of thyroid nodule.  7. History of cardiomyopathy Patient with history of cardiomyopathy with chronic systolic congestive heart failure as well as hypertension.  Patient is followed by Dr. Zella Richer and is prescribed Coreg.  She will continue to be followed by Dr. Rockey Situ for her cardiac concerns.  8. History of migraine headaches Patient with history of migraine headaches for which my nurse discussed that we will likely be able to continue her Imitrex nasal spray on a as needed basis.  9. History of anxiety Patient with history of anxiety for which she was put on buspirone as well as hydroxyzine at night.  Patient did not return for further evaluation and continuance of therapy  10. History of depression She with a history of depression at one time was on venlafaxine ER.  Patient has not continued this and this will be discussed when she returns for medication refills.  11. History of hyperglycemia On evaluation of the chart it was noted that patient had a history of hyperglycemia with an A1c of 6.7.  She was started on Metformin but she claimed that she was unable to take this because intolerability to the medication.  This will need to be rechecked as well and most likely so will  lipid concerns.

## 2020-04-03 ENCOUNTER — Telehealth: Payer: Self-pay

## 2020-04-03 ENCOUNTER — Other Ambulatory Visit: Payer: Self-pay | Admitting: Family Medicine

## 2020-04-03 ENCOUNTER — Telehealth: Payer: Self-pay | Admitting: Family Medicine

## 2020-04-03 ENCOUNTER — Other Ambulatory Visit: Payer: Self-pay | Admitting: Nurse Practitioner

## 2020-04-03 DIAGNOSIS — I10 Essential (primary) hypertension: Secondary | ICD-10-CM

## 2020-04-03 DIAGNOSIS — E079 Disorder of thyroid, unspecified: Secondary | ICD-10-CM

## 2020-04-03 MED ORDER — LISINOPRIL 20 MG PO TABS: ORAL_TABLET | ORAL | 1 refills | Status: DC

## 2020-04-03 NOTE — Telephone Encounter (Signed)
*  STAT* If patient is at the pharmacy, call can be transferred to refill team.   1. Which medications need to be refilled? (please list name of each medication and dose if known) LIsinopril  2. Which pharmacy/location (including street and city if local pharmacy) is medication to be sent to? [place]   3. Do they need a 30 day or 90 day supply?

## 2020-04-03 NOTE — Telephone Encounter (Signed)
Please advise if ok to refill Lisinopril 20 MG QD last filled by PCP.

## 2020-04-03 NOTE — Telephone Encounter (Signed)
levothyroxine (SYNTHROID) 100 MCG tablet  lisinopril (ZESTRIL) 20 MG tablet    Patient is requesting 90 day supply.  Patient is out of this medication.   Pharmacy:  [facility] [place], [place] [address] AT [place] Phone:  [phone]  Fax:  [phone]

## 2020-04-06 ENCOUNTER — Other Ambulatory Visit: Payer: Self-pay

## 2020-04-06 ENCOUNTER — Telehealth: Payer: Self-pay

## 2020-04-06 DIAGNOSIS — E079 Disorder of thyroid, unspecified: Secondary | ICD-10-CM

## 2020-04-06 MED ORDER — LEVOTHYROXINE SODIUM 100 MCG PO TABS: 100.0000 ug | ORAL_TABLET | Freq: Every day | ORAL | 0 refills | Status: DC

## 2020-04-06 NOTE — Telephone Encounter (Signed)
Pt following up on request for   levothyroxine (SYNTHROID) 100 MCG tablet  Dr Ronnald Ramp has never filled this Rx.  Pt needs 90 day to  [place], [place] [address] AT [place] Phone:  [phone]  Fax:  [phone]     Pt is out of medication, needs asap.

## 2020-04-06 NOTE — Telephone Encounter (Signed)
This is a Dr. Rockey Situ patient. Lisinopril has already been refilled on 04/03/20. No further action needed.

## 2020-04-06 NOTE — Telephone Encounter (Signed)
Called patient and informed Dr Ronnald Ramp sent in 90 days with 0 refills of her levothyroxine. Explained she needs to call back and schedule a 3 month follow up before we can refill her medications. Gave our office number on the VM for patient to call back and schedule.  CM

## 2020-04-06 NOTE — Telephone Encounter (Deleted)
error 

## 2020-04-07 NOTE — Telephone Encounter (Signed)
Levothyroxine sent for 90 days. Left pt VM that she needs to schedule a follow up with Dr Ronnald Ramp before she can get another RF.  CM

## 2020-04-08 ENCOUNTER — Ambulatory Visit: Payer: Commercial Managed Care - PPO | Attending: Anesthesiology | Admitting: Anesthesiology

## 2020-04-08 ENCOUNTER — Other Ambulatory Visit: Payer: Self-pay

## 2020-04-08 DIAGNOSIS — F1190 Opioid use, unspecified, uncomplicated: Secondary | ICD-10-CM

## 2020-04-08 DIAGNOSIS — M533 Sacrococcygeal disorders, not elsewhere classified: Secondary | ICD-10-CM

## 2020-04-08 DIAGNOSIS — G894 Chronic pain syndrome: Secondary | ICD-10-CM

## 2020-04-08 DIAGNOSIS — G8929 Other chronic pain: Secondary | ICD-10-CM

## 2020-04-08 DIAGNOSIS — M25512 Pain in left shoulder: Secondary | ICD-10-CM

## 2020-04-08 DIAGNOSIS — M25549 Pain in joints of unspecified hand: Secondary | ICD-10-CM

## 2020-04-08 DIAGNOSIS — M5442 Lumbago with sciatica, left side: Secondary | ICD-10-CM

## 2020-04-08 DIAGNOSIS — M25511 Pain in right shoulder: Secondary | ICD-10-CM

## 2020-04-08 DIAGNOSIS — M5441 Lumbago with sciatica, right side: Secondary | ICD-10-CM

## 2020-04-08 DIAGNOSIS — L4050 Arthropathic psoriasis, unspecified: Secondary | ICD-10-CM | POA: Diagnosis not present

## 2020-04-08 DIAGNOSIS — M25552 Pain in left hip: Secondary | ICD-10-CM

## 2020-04-08 DIAGNOSIS — M25532 Pain in left wrist: Secondary | ICD-10-CM

## 2020-04-08 MED ORDER — OXYCODONE-ACETAMINOPHEN 10-325 MG PO TABS: 1.0000 | ORAL_TABLET | Freq: Four times a day (QID) | ORAL | 0 refills | Status: DC | PRN

## 2020-04-08 NOTE — Progress Notes (Signed)
.Virtual Visit via Video Note  I connected with Krista Kennedy on 04/08/20 at  1:45 PM EDT by a video enabled telemedicine application and verified that I am speaking with the correct person using two identifiers.  Location: Patient: Home Provider: [facility] control center   I discussed the limitations of evaluation and management by telemedicine and the availability of in person appointments. The patient expressed understanding and agreed to proceed.  History of Present Illness: I spoke with Krista Kennedy via video today for her virtual appointment.  She reports that she has had continued low back pain that has been limiting for her ability to continue with her current work status.  Furthermore she is suffering from Covid-like symptoms and has a son who has been diagnosed.  She has a pending Covid test she reports.  She presented today for urine drug screen which she is overdue for and this was deferred secondary to her Covid status.  Nonetheless she continues to suffer from chronic diffuse body pain similar to what she has experienced in the past.  She is been taking her Percocet as prescribed.  This is been chronic in nature for many years and she has done well with this historically.  No recent problems with medication are noted and she continues to take it as prescribed with no evidence of any illicit or diverting use.  She is due for refill today which she reports.  The quality characteristic distribution of of the pain remain unchanged and stable in nature.  She is requesting assistance with a possible FTE and some assistance with her low back with possible physical therapy.  Otherwise she is in her usual state of health with no new changes in lower extremity strength or function or bowel or bladder function.    Observations/Objective:  Current Outpatient Medications:  .  Ascorbic Acid (VITAMIN C) 1000 MG tablet, Take 1,000 mg by mouth daily., Disp: , Rfl:  .  carvedilol (COREG) 25 MG tablet, Take  1 tablet (25 mg total) by mouth 2 (two) times daily with a meal., Disp: 180 tablet, Rfl: 0 .  Cholecalciferol (VITAMIN D-1000 MAX ST) 1000 UNITS tablet, Take by mouth daily. , Disp: , Rfl:  .  clobetasol (TEMOVATE) 0.05 % external solution, Apply 1 application topically as needed. , Disp: , Rfl:  .  estradiol (CLIMARA - DOSED IN MG/24 HR) 0.025 mg/24hr patch, Place 1 patch (0.025 mg total) onto the skin once a week. (Patient not taking: Reported on 04/02/2020), Disp: 12 patch, Rfl: 0 .  fluticasone (FLONASE) 50 MCG/ACT nasal spray, Place 2 sprays into both nostrils daily., Disp: 16 g, Rfl: 3 .  hydrOXYzine (ATARAX/VISTARIL) 10 MG tablet, , Disp: , Rfl:  .  ipratropium (ATROVENT) 0.06 % nasal spray, Place 2 sprays into both nostrils 4 (four) times daily. For up to 5-7 days then stop. (Patient not taking: Reported on 04/02/2020), Disp: 15 mL, Rfl: 0 .  Ixekizumab (TALTZ) 80 MG/ML SOSY, Inject 80 mg into the skin as directed. [facility], Disp: , Rfl:  .  levothyroxine (SYNTHROID) 100 MCG tablet, Take 1 tablet (100 mcg total) by mouth daily before breakfast., Disp: 90 tablet, Rfl: 0 .  lisinopril (ZESTRIL) 20 MG tablet, TAKE 1 TABLET(20 MG) BY MOUTH DAILY, Disp: 90 tablet, Rfl: 1 .  metFORMIN (GLUCOPHAGE) 500 MG tablet, Take 1 tablet (500 mg total) by mouth 2 (two) times daily with a meal. (Patient not taking: Reported on 04/02/2020), Disp: 180 tablet, Rfl: 3 .  Multiple Vitamin (  MULTI VITAMIN PO), Take 1 tablet by mouth daily., Disp: , Rfl:  .  naloxone (NARCAN) nasal spray 4 mg/0.1 mL, To reverse excess sedation from narcotics, Disp: 2 kit, Rfl: 1 .  ondansetron (ZOFRAN) 4 MG tablet, Take 1 tablet (4 mg total) by mouth every 8 (eight) hours as needed for nausea or vomiting., Disp: 20 tablet, Rfl: 0 .  oxyCODONE-acetaminophen (PERCOCET) 10-325 MG tablet, Take 1 tablet by mouth every 6 (six) hours as needed for pain., Disp: 120 tablet, Rfl: 0 .  pantoprazole (PROTONIX) 40 MG tablet, Take 1 tablet (40 mg  total) by mouth daily., Disp: 30 tablet, Rfl: 3 .  progesterone (PROMETRIUM) 100 MG capsule, TAKE 1 CAPSULE(100 MG) BY MOUTH DAILY, Disp: 90 capsule, Rfl: 0 .  spironolactone (ALDACTONE) 25 MG tablet, Take 1 tablet (25 mg total) by mouth daily., Disp: 90 tablet, Rfl: 3 .  SUMAtriptan (IMITREX) 5 MG/ACT nasal spray, PLACE 1 SPRAY INTO THE NOSE EVERY 2 HOURS AS NEEDED FOR MIGRAINE, Disp: 1 Inhaler, Rfl: 1 .  venlafaxine XR (EFFEXOR-XR) 150 MG 24 hr capsule, TAKE 1 CAPSULE(150 MG) BY MOUTH DAILY WITH BREAKFAST (Patient not taking: Reported on 04/02/2020), Disp: 90 capsule, Rfl: 0  Assessment and Plan: 1. Psoriasis with arthropathy ([place])   2. Chronic pain of both shoulders   3. Pain in multiple finger joints   4. Chronic bilateral low back pain with bilateral sciatica   5. Chronic pain syndrome   6. Chronic, continuous use of opioids   7. Chronic left hip pain   8. Left wrist pain   9. Chronic sacroiliac joint pain   Based on our discussion today I think is appropriate to refill her medications but I have forewarned her that she will need a urine drug screen here within the next 2 to 3 weeks.  Via video today she obviously has Covid-like symptoms and may defer for 10 days or so but needs to present for the urine screen.  She is aware of this.  She also is aware that she may risk possible detox symptoms if she is unable to get the urine screen and runs out of medication.  She also understands the risks and benefits of chronic opioid management and maintenance therapy as we have gone over this multiple times in the past.  In the meantime I am sending her over to physical therapy for evaluation and treatment for low back pain much of which appears to be muscular spasming.  Also she is in need of a functional capacity evaluation.  We are requesting assistance from physical therapy or she may need to be seen by orthopedics for this evaluation.  I encouraged her to continue follow-up with her primary care  physicians for baseline medical care with return to clinic in approximately 2 months. Follow Up Instructions:    I discussed the assessment and treatment plan with the patient. The patient was provided an opportunity to ask questions and all were answered. The patient agreed with the plan and demonstrated an understanding of the instructions.   The patient was advised to call back or seek an in-person evaluation if the symptoms worsen or if the condition fails to improve as anticipated.  I provided 30 minutes of non-face-to-face time during this encounter.   Molli Barrows, MD

## 2020-04-13 ENCOUNTER — Telehealth: Payer: Self-pay | Admitting: Anesthesiology

## 2020-04-13 NOTE — Telephone Encounter (Signed)
Patient called stating she does not want to continue with Physical Therapy as they do not take xrays or anything, she wants to go to orthopedic doctor. Says Does Dr. Andree Elk recommend anyone in particular she will call them herself instead of having to wait on them to call.

## 2020-04-14 NOTE — Telephone Encounter (Signed)
Informed patient that I will ask Dr. Andree Elk for orthopedic referral tomorrow.

## 2020-04-15 NOTE — Telephone Encounter (Signed)
Attempted to call patient to ask what she needs PT for. Message left.

## 2020-05-05 ENCOUNTER — Telehealth: Payer: Self-pay | Admitting: *Deleted

## 2020-05-05 NOTE — Telephone Encounter (Signed)
Schedule for an appointment as soon as available.

## 2020-05-06 ENCOUNTER — Other Ambulatory Visit (HOSPITAL_COMMUNITY)
Admission: RE | Admit: 2020-05-06 | Discharge: 2020-05-06 | Disposition: A | Discharge status: Home / Self Care | Payer: Commercial Managed Care - PPO | Source: Ambulatory Visit | Attending: Advanced Practice Midwife | Admitting: Advanced Practice Midwife

## 2020-05-06 ENCOUNTER — Ambulatory Visit (INDEPENDENT_AMBULATORY_CARE_PROVIDER_SITE_OTHER): Payer: Commercial Managed Care - PPO | Admitting: Advanced Practice Midwife

## 2020-05-06 ENCOUNTER — Encounter: Payer: Self-pay | Admitting: Advanced Practice Midwife

## 2020-05-06 ENCOUNTER — Other Ambulatory Visit: Payer: Self-pay

## 2020-05-06 VITALS — BP 161/81 | HR 114 | Ht 70.0 in | Wt 165.0 lb

## 2020-05-06 DIAGNOSIS — N951 Menopausal and female climacteric states: Secondary | ICD-10-CM

## 2020-05-06 DIAGNOSIS — Z124 Encounter for screening for malignant neoplasm of cervix: Secondary | ICD-10-CM | POA: Diagnosis not present

## 2020-05-06 DIAGNOSIS — Z01419 Encounter for gynecological examination (general) (routine) without abnormal findings: Secondary | ICD-10-CM

## 2020-05-06 DIAGNOSIS — Z1239 Encounter for other screening for malignant neoplasm of breast: Secondary | ICD-10-CM | POA: Diagnosis not present

## 2020-05-06 DIAGNOSIS — Z0000 Encounter for general adult medical examination without abnormal findings: Secondary | ICD-10-CM

## 2020-05-06 DIAGNOSIS — L089 Local infection of the skin and subcutaneous tissue, unspecified: Secondary | ICD-10-CM

## 2020-05-06 LAB — TOXASSURE SELECT 13 (MW), URINE

## 2020-05-06 MED ORDER — ESTRADIOL 0.025 MG/24HR TD PTWK: 0.0250 mg | MEDICATED_PATCH | TRANSDERMAL | 11 refills | Status: DC

## 2020-05-06 MED ORDER — DOXYCYCLINE HYCLATE 100 MG PO TABS: 100.0000 mg | ORAL_TABLET | Freq: Two times a day (BID) | ORAL | 0 refills | Status: DC
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
## 2020-05-07 LAB — COMPREHENSIVE METABOLIC PANEL
ALT: 16 IU/L (ref 0–32)
AST: 24 IU/L (ref 0–40)
Albumin/Globulin Ratio: 1.4 (ref 1.2–2.2)
Albumin: 4.6 g/dL (ref 3.8–4.9)
Alkaline Phosphatase: 105 IU/L (ref 48–121)
BUN/Creatinine Ratio: 13 (ref 9–23)
BUN: 13 mg/dL (ref 6–24)
Bilirubin Total: 1.3 mg/dL — ABNORMAL HIGH (ref 0.0–1.2)
CO2: 25 mmol/L (ref 20–29)
Calcium: 10.2 mg/dL (ref 8.7–10.2)
Chloride: 104 mmol/L (ref 96–106)
Creatinine, Ser: 1.01 mg/dL — ABNORMAL HIGH (ref 0.57–1.00)
GFR calc Af Amer: 73 mL/min/{1.73_m2} (ref >59)
GFR calc non Af Amer: 64 mL/min/{1.73_m2} (ref >59)
Globulin, Total: 3.2 g/dL (ref 1.5–4.5)
Glucose: 146 mg/dL — ABNORMAL HIGH (ref 65–99)
Potassium: 4.1 mmol/L (ref 3.5–5.2)
Sodium: 143 mmol/L (ref 134–144)
Total Protein: 7.8 g/dL (ref 6.0–8.5)

## 2020-05-07 LAB — LIPID PANEL WITH LDL/HDL RATIO
Cholesterol, Total: 200 mg/dL — ABNORMAL HIGH (ref 100–199)
HDL: 60 mg/dL (ref >39)
LDL Chol Calc (NIH): 118 mg/dL — ABNORMAL HIGH (ref 0–99)
LDL/HDL Ratio: 2 ratio (ref 0.0–3.2)
Triglycerides: 122 mg/dL (ref 0–149)
VLDL Cholesterol Cal: 22 mg/dL (ref 5–40)

## 2020-05-07 LAB — HGB A1C W/O EAG: Hgb A1c MFr Bld: 6.4 % — ABNORMAL HIGH (ref 4.8–5.6)

## 2020-05-07 LAB — TSH: TSH: 0.13 u[IU]/mL — ABNORMAL LOW (ref 0.450–4.500)

## 2020-05-07 LAB — CBC WITH DIFFERENTIAL/PLATELET
Basophils Absolute: 0 10*3/uL (ref 0.0–0.2)
Basos: 0 %
EOS (ABSOLUTE): 0 10*3/uL (ref 0.0–0.4)
Eos: 0 %
Hematocrit: 40.6 % (ref 34.0–46.6)
Hemoglobin: 13.3 g/dL (ref 11.1–15.9)
Immature Grans (Abs): 0 10*3/uL (ref 0.0–0.1)
Immature Granulocytes: 0 %
Lymphocytes Absolute: 1.2 10*3/uL (ref 0.7–3.1)
Lymphs: 14 %
MCH: 29.7 pg (ref 26.6–33.0)
MCHC: 32.8 g/dL (ref 31.5–35.7)
MCV: 91 fL (ref 79–97)
Monocytes Absolute: 0.5 10*3/uL (ref 0.1–0.9)
Monocytes: 6 %
Neutrophils Absolute: 7.4 10*3/uL — ABNORMAL HIGH (ref 1.4–7.0)
Neutrophils: 80 %
Platelets: 259 10*3/uL (ref 150–450)
RBC: 4.48 x10E6/uL (ref 3.77–5.28)
RDW: 12 % (ref 11.7–15.4)
WBC: 9.2 10*3/uL (ref 3.4–10.8)

## 2020-05-07 NOTE — Telephone Encounter (Signed)
Patient notified that she was scheduled a virtual appointment with Dr Andree Elk in the morning.

## 2020-05-08 ENCOUNTER — Ambulatory Visit: Payer: Commercial Managed Care - PPO | Attending: Anesthesiology | Admitting: Anesthesiology

## 2020-05-08 ENCOUNTER — Encounter: Payer: Self-pay | Admitting: Anesthesiology

## 2020-05-08 ENCOUNTER — Other Ambulatory Visit: Payer: Self-pay

## 2020-05-08 DIAGNOSIS — L4050 Arthropathic psoriasis, unspecified: Secondary | ICD-10-CM | POA: Diagnosis not present

## 2020-05-08 DIAGNOSIS — M25549 Pain in joints of unspecified hand: Secondary | ICD-10-CM

## 2020-05-08 DIAGNOSIS — F1190 Opioid use, unspecified, uncomplicated: Secondary | ICD-10-CM

## 2020-05-08 DIAGNOSIS — M25511 Pain in right shoulder: Secondary | ICD-10-CM | POA: Diagnosis not present

## 2020-05-08 DIAGNOSIS — M5441 Lumbago with sciatica, right side: Secondary | ICD-10-CM

## 2020-05-08 DIAGNOSIS — M5442 Lumbago with sciatica, left side: Secondary | ICD-10-CM

## 2020-05-08 DIAGNOSIS — M25512 Pain in left shoulder: Secondary | ICD-10-CM

## 2020-05-08 DIAGNOSIS — G894 Chronic pain syndrome: Secondary | ICD-10-CM

## 2020-05-08 DIAGNOSIS — G8929 Other chronic pain: Secondary | ICD-10-CM

## 2020-05-08 DIAGNOSIS — M25552 Pain in left hip: Secondary | ICD-10-CM

## 2020-05-08 DIAGNOSIS — M25532 Pain in left wrist: Secondary | ICD-10-CM

## 2020-05-08 MED ORDER — OXYCODONE-ACETAMINOPHEN 10-325 MG PO TABS: 1.0000 | ORAL_TABLET | Freq: Four times a day (QID) | ORAL | 0 refills | Status: DC | PRN

## 2020-05-08 MED ORDER — OXYCODONE-ACETAMINOPHEN 10-325 MG PO TABS: 1.0000 | ORAL_TABLET | Freq: Four times a day (QID) | ORAL | 0 refills | Status: AC | PRN

## 2020-05-08 MED ORDER — ESTRADIOL 0.025 MG/24HR TD PTWK: 0.0250 mg | MEDICATED_PATCH | TRANSDERMAL | 0 refills | Status: AC

## 2020-05-08 NOTE — Progress Notes (Signed)
Virtual Visit via Telephone Note  I connected with Krista Kennedy on 05/08/20 at  9:00 AM EDT by telephone and verified that I am speaking with the correct person using two identifiers.  Location: Patient: Home Provider: Pain control center   I discussed the limitations, risks, security and privacy concerns of performing an evaluation and management service by telephone and the availability of in person appointments. I also discussed with the patient that there may be a patient responsible charge related to this service. The patient expressed understanding and agreed to proceed.   History of Present Illness: I spoke with Krista Kennedy today via telephone as we were unable to connect for the video portion of the virtual conference.  After identification she reports that she has been doing reasonably well with her medication management and overall pain control.  She continues to suffer from diffuse arthritic pain of the same quality characteristic and distribution as before.  She is taking her medications as prescribed and these continue to give her good relief.  Despite medication management she still has periods of some pain but overall is markedly better on opioid management.  She takes the medications as prescribed and has been compliant she reports.  She has no side effects with the medicines.  She is due for follow-up with her rheumatologist soon for further evaluation and consideration of other Biologics that might be of benefit for her.  She continues to stay active working on her stretching strengthening activities.  No other changes in the quality of her pain are reported and she continues to derive good functional benefit with her current opioid management protocol.    Observations   Current Outpatient Medications:  .  Ascorbic Acid (VITAMIN C) 1000 MG tablet, Take 1,000 mg by mouth daily., Disp: , Rfl:  .  carvedilol (COREG) 25 MG tablet, Take 1 tablet (25 mg total) by mouth 2 (two) times daily  with a meal., Disp: 180 tablet, Rfl: 0 .  Cholecalciferol (VITAMIN D-1000 MAX ST) 1000 UNITS tablet, Take by mouth daily. , Disp: , Rfl:  .  clobetasol (TEMOVATE) 0.05 % external solution, Apply 1 application topically as needed. , Disp: , Rfl:  .  doxycycline (VIBRA-TABS) 100 MG tablet, Take 1 tablet (100 mg total) by mouth 2 (two) times daily., Disp: 20 tablet, Rfl: 0 .  estradiol (CLIMARA - DOSED IN MG/24 HR) 0.025 mg/24hr patch, Place 1 patch (0.025 mg total) onto the skin once a week., Disp: 4 patch, Rfl: 11 .  fluticasone (FLONASE) 50 MCG/ACT nasal spray, Place 2 sprays into both nostrils daily., Disp: 16 g, Rfl: 3 .  hydrOXYzine (ATARAX/VISTARIL) 10 MG tablet, , Disp: , Rfl:  .  ipratropium (ATROVENT) 0.06 % nasal spray, Place 2 sprays into both nostrils 4 (four) times daily. For up to 5-7 days then stop., Disp: 15 mL, Rfl: 0 .  Ixekizumab (TALTZ) 80 MG/ML SOSY, Inject 80 mg into the skin as directed. [facility], Disp: , Rfl:  .  levothyroxine (SYNTHROID) 100 MCG tablet, Take 1 tablet (100 mcg total) by mouth daily before breakfast., Disp: 90 tablet, Rfl: 0 .  lisinopril (ZESTRIL) 20 MG tablet, TAKE 1 TABLET(20 MG) BY MOUTH DAILY, Disp: 90 tablet, Rfl: 1 .  metFORMIN (GLUCOPHAGE) 500 MG tablet, Take 1 tablet (500 mg total) by mouth 2 (two) times daily with a meal., Disp: 180 tablet, Rfl: 3 .  Multiple Vitamin (MULTI VITAMIN PO), Take 1 tablet by mouth daily., Disp: , Rfl:  .  naloxone (NARCAN)  nasal spray 4 mg/0.1 mL, To reverse excess sedation from narcotics, Disp: 2 kit, Rfl: 1 .  ondansetron (ZOFRAN) 4 MG tablet, Take 1 tablet (4 mg total) by mouth every 8 (eight) hours as needed for nausea or vomiting., Disp: 20 tablet, Rfl: 0 .  oxyCODONE-acetaminophen (PERCOCET) 10-325 MG tablet, Take 1 tablet by mouth every 6 (six) hours as needed for pain., Disp: 120 tablet, Rfl: 0 .  [START ON 06/07/2020] oxyCODONE-acetaminophen (PERCOCET) 10-325 MG tablet, Take 1 tablet by mouth every 6 (six) hours  as needed for pain., Disp: 120 tablet, Rfl: 0 .  pantoprazole (PROTONIX) 40 MG tablet, Take 1 tablet (40 mg total) by mouth daily., Disp: 30 tablet, Rfl: 3 .  progesterone (PROMETRIUM) 100 MG capsule, TAKE 1 CAPSULE(100 MG) BY MOUTH DAILY, Disp: 90 capsule, Rfl: 0 .  spironolactone (ALDACTONE) 25 MG tablet, Take 1 tablet (25 mg total) by mouth daily., Disp: 90 tablet, Rfl: 3 .  SUMAtriptan (IMITREX) 5 MG/ACT nasal spray, PLACE 1 SPRAY INTO THE NOSE EVERY 2 HOURS AS NEEDED FOR MIGRAINE, Disp: 1 Inhaler, Rfl: 1 .  venlafaxine XR (EFFEXOR-XR) 150 MG 24 hr capsule, TAKE 1 CAPSULE(150 MG) BY MOUTH DAILY WITH BREAKFAST, Disp: 90 capsule, Rfl: 0 Assessment and Plan: 1. Psoriasis with arthropathy ([place])   2. Chronic pain of both shoulders   3. Pain in multiple finger joints   4. Chronic bilateral low back pain with bilateral sciatica   5. Chronic pain syndrome   6. Chronic, continuous use of opioids   7. Chronic left hip pain   8. Left wrist pain   9. Psoriatic arthritis [facility])   Upon review of the [facility] and upon discussion with the patient we will refill her medications for the next 2 months.  This be dated for September 3 on October 3.  I have her scheduled for 24-monthreturn to clinic.  I have reviewed her urine screen from recent and it is appropriate.  I have encouraged her to continue follow-up with her rheumatologist for further recommendations and management.  No other changes are made in her pain management protocol today. Follow Up Instructions:    I discussed the assessment and treatment plan with the patient. The patient was provided an opportunity to ask questions and all were answered. The patient agreed with the plan and demonstrated an understanding of the instructions.   The patient was advised to call back or seek an in-person evaluation if the symptoms worsen or if the condition fails to improve as anticipated.  I provided 30 minutes of non-face-to-face  time during this encounter.   JMolli Barrows MD

## 2020-05-13 ENCOUNTER — Telehealth: Payer: Self-pay

## 2020-05-13 ENCOUNTER — Ambulatory Visit: Payer: Commercial Managed Care - PPO | Admitting: Family Medicine

## 2020-05-13 NOTE — Telephone Encounter (Signed)
I called the pt to let her know the importance of her coming to appt with Dr Ronnald Ramp for thyroid. She did not answer and the vm is asking me for a mailbox key to leave a message? I do not know anything about that. Unable to leave message.

## 2020-05-13 NOTE — Telephone Encounter (Signed)
I spoke to pt concerning the need to come in for thyroid and diabetes. She is coming in on the 10th. She seemed to be upset and reluctant to come in, but after I explained things to her she said she would be here on the 10th. She did ask if she would have to come again. I told her that if there is a change made on medicines, we ask the pt to come back in 6 weeks for a recheck.

## 2020-05-15 ENCOUNTER — Ambulatory Visit: Payer: Commercial Managed Care - PPO | Admitting: Family Medicine

## 2020-05-15 LAB — CYTOLOGY - PAP
Comment: NEGATIVE
Comment: NEGATIVE
Comment: NEGATIVE
Diagnosis: UNDETERMINED — AB
HPV 16: NEGATIVE
HPV 18 / 45: NEGATIVE
High risk HPV: POSITIVE — AB

## 2020-05-19 ENCOUNTER — Telehealth: Payer: Commercial Managed Care - PPO | Admitting: Anesthesiology

## 2020-06-01 ENCOUNTER — Other Ambulatory Visit: Payer: Self-pay

## 2020-06-01 DIAGNOSIS — I10 Essential (primary) hypertension: Secondary | ICD-10-CM

## 2020-06-01 DIAGNOSIS — I5022 Chronic systolic (congestive) heart failure: Secondary | ICD-10-CM

## 2020-06-01 MED ORDER — CARVEDILOL 25 MG PO TABS: 25.0000 mg | ORAL_TABLET | Freq: Two times a day (BID) | ORAL | 0 refills | Status: DC

## 2020-06-01 MED ORDER — LISINOPRIL 20 MG PO TABS: ORAL_TABLET | ORAL | 0 refills | Status: DC

## 2020-06-01 MED ORDER — SPIRONOLACTONE 25 MG PO TABS: 25.0000 mg | ORAL_TABLET | Freq: Every day | ORAL | 0 refills | Status: DC

## 2020-06-01 NOTE — Telephone Encounter (Signed)
*  STAT* If patient is at the pharmacy, call can be transferred to refill team.   1. Which medications need to be refilled? (please list name of each medication and dose if known) Spironolactone, Lisinopril, Carvedilol   2. Which pharmacy/location (including street and city if local pharmacy) is medication to be sent to?[facility] [place]  3. Do they need a 30 day or 90 day supply? [place]

## 2020-06-08 ENCOUNTER — Ambulatory Visit (INDEPENDENT_AMBULATORY_CARE_PROVIDER_SITE_OTHER): Payer: Commercial Managed Care - PPO | Admitting: Gastroenterology

## 2020-06-08 ENCOUNTER — Other Ambulatory Visit: Payer: Self-pay

## 2020-06-08 ENCOUNTER — Encounter: Payer: Self-pay | Admitting: Gastroenterology

## 2020-06-08 VITALS — BP 144/78 | HR 91 | Temp 97.6°F | Ht 70.0 in | Wt 167.4 lb

## 2020-06-08 DIAGNOSIS — R112 Nausea with vomiting, unspecified: Secondary | ICD-10-CM

## 2020-06-08 DIAGNOSIS — R1112 Projectile vomiting: Secondary | ICD-10-CM

## 2020-06-08 DIAGNOSIS — R1084 Generalized abdominal pain: Secondary | ICD-10-CM

## 2020-06-08 DIAGNOSIS — Z1211 Encounter for screening for malignant neoplasm of colon: Secondary | ICD-10-CM

## 2020-06-08 MED ORDER — NA SULFATE-K SULFATE-MG SULF 17.5-3.13-1.6 GM/177ML PO SOLN: ORAL | 0 refills | Status: DC

## 2020-06-08 MED ORDER — PROMETHAZINE HCL 12.5 MG PO TABS: 12.5000 mg | ORAL_TABLET | Freq: Four times a day (QID) | ORAL | 0 refills | Status: DC | PRN

## 2020-06-09 NOTE — Progress Notes (Signed)
Krista Kennedy [address]  [place], [place] [address]  Main: [phone]  Fax: [phone]   Gastroenterology Consultation  Referring Provider:     Juline Patch, MD Primary Care Physician:  Juline Patch, MD Reason for Consultation:     Nausea        HPI:    Chief Complaint  Patient presents with  . Chronic nausea    Patient stated that she has nausea at times.    Krista Kennedy is a 54 y.o. y/o female referred for consultation & management  by Dr. Juline Patch, MD.  Patient reports chronic history of nausea, relieved with promethazine, but not with Zofran or Phenergan, therefore not taking those.  No prior EGD or colonoscopy.  Has been on PPI but continues to have symptoms.  Denies dysphagia or NSAID use.  No family history of colon cancer.  Reports decreased appetite due to her symptoms.  Is currently being treated for abnormal TSH.  Past Medical History:  Diagnosis Date  . Arthritis   . Cardiomyopathy ([place])   . CHF (congestive heart failure) ([place])   . Hyperlipidemia   . Hypertension   . Psoriasis   . Thyroid disease   . Transient weakness of left leg 08/13/2019    Past Surgical History:  Procedure Laterality Date  . ENDOMETRIAL ABLATION  2015  . THYROIDECTOMY    . TONSILLECTOMY    . TUBAL LIGATION      Prior to Admission medications   Medication Sig Start Date End Date Taking? Authorizing Provider  Ascorbic Acid (VITAMIN C) 1000 MG tablet Take 1,000 mg by mouth daily.   Yes [provider]  carvedilol (COREG) 25 MG tablet Take 1 tablet (25 mg total) by mouth 2 (two) times daily with a meal. 06/01/20  Yes Gollan, Kathlene November, MD  Cholecalciferol (VITAMIN D-1000 MAX ST) 1000 UNITS tablet Take by mouth daily.    Yes [provider]  clobetasol (TEMOVATE) 0.05 % external solution Apply 1 application topically as needed.  03/22/17  Yes [provider]  estradiol (CLIMARA - DOSED IN MG/24 HR) 0.025 mg/24hr patch  Place 1 patch (0.025 mg total) onto the skin once a week. 05/08/20  Yes Rod Can, CNM  fluticasone (FLONASE) 50 MCG/ACT nasal spray Place 2 sprays into both nostrils daily. 12/18/18  Yes Mikey College, NP  ipratropium (ATROVENT) 0.06 % nasal spray Place 2 sprays into both nostrils 4 (four) times daily. For up to 5-7 days then stop. 03/15/17  Yes Karamalegos, Alexander J, DO  Ixekizumab (TALTZ) 80 MG/ML SOSY Inject 80 mg into the skin as directed. [facility]   Yes [provider]  levothyroxine (SYNTHROID) 100 MCG tablet Take 1 tablet (100 mcg total) by mouth daily before breakfast. 04/06/20  Yes Glean Hess, MD  lisinopril (ZESTRIL) 20 MG tablet TAKE 1 TABLET(20 MG) BY MOUTH DAILY 06/01/20  Yes Gollan, Kathlene November, MD  Multiple Vitamin (MULTI VITAMIN PO) Take 1 tablet by mouth daily.   Yes [provider]  ondansetron (ZOFRAN) 4 MG tablet Take 1 tablet (4 mg total) by mouth every 8 (eight) hours as needed for nausea or vomiting. 12/18/18  Yes Mikey College, NP  oxyCODONE-acetaminophen (PERCOCET) 10-325 MG tablet Take 1 tablet by mouth every 6 (six) hours as needed for pain. 06/07/20 07/07/20 Yes Molli Barrows, MD  pantoprazole (PROTONIX) 40 MG tablet Take 1 tablet (40 mg total) by mouth daily. 04/02/20  Yes  Juline Patch, MD  progesterone (PROMETRIUM) 100 MG capsule TAKE 1 CAPSULE(100 MG) BY MOUTH DAILY 10/07/19  Yes Karamalegos, Devonne Doughty, DO  spironolactone (ALDACTONE) 25 MG tablet Take 1 tablet (25 mg total) by mouth daily. 06/01/20 08/30/20 Yes Gollan, Kathlene November, MD  Na Sulfate-K Sulfate-Mg Sulf 17.5-3.13-1.6 GM/177ML SOLN At 5 PM the day before procedure take 1 bottle and 5 hours before procedure take 1 bottle. 06/08/20   Virgel Manifold, MD  naloxone [facility]) nasal spray 4 mg/0.1 mL To reverse excess sedation from narcotics Patient not taking: Reported on 06/08/2020 12/28/17   Molli Barrows, MD  promethazine (PHENERGAN) 12.5 MG tablet Take 1 tablet (12.5  mg total) by mouth every 6 (six) hours as needed for up to 30 doses for nausea or vomiting. 06/08/20   Virgel Manifold, MD    Family History  Problem Relation Age of Onset  . Hypertension Mother   . Heart disease Father        CABG   . Hyperlipidemia Father   . Hypertension Father   . Diabetes Father      Social History   Tobacco Use  . Smoking status: Former Smoker    Packs/day: 0.25    Years: 15.00    Pack years: 3.75    Types: Cigarettes  . Smokeless tobacco: Former Network engineer  . Vaping Use: Every day  . Substances: Flavoring  Substance Use Topics  . Alcohol use: Yes    Comment: social  . Drug use: No    Allergies as of 06/08/2020 - Review Complete 06/08/2020  Allergen Reaction Noted  . Other  01/11/2019  . Sulfa antibiotics Hives 04/30/2013  . Sulfacetamide sodium  08/06/2015    Review of Systems:    All systems reviewed and negative except where noted in HPI.   Physical Exam:  BP (!) 144/78   Pulse 91   Temp 97.6 F (36.4 C) (Oral)   Ht 5\' 10"  (1.778 m)   Wt 167 lb 6.4 oz (75.9 kg)   BMI 24.02 kg/m  No LMP recorded. Patient has had an ablation. Psych:  Alert and cooperative. Normal mood and affect. General:   Alert,  Well-developed, well-nourished, pleasant and cooperative in NAD Head:  Normocephalic and atraumatic. Eyes:  Sclera clear, no icterus.   Conjunctiva pink. Ears:  Normal auditory acuity. Nose:  No deformity, discharge, or lesions. Mouth:  No deformity or lesions,oropharynx pink & moist. Neck:  Supple; no masses or thyromegaly. Abdomen:  Normal bowel sounds.  No bruits.  Soft, non-tender and non-distended without masses, hepatosplenomegaly or hernias noted.  No guarding or rebound tenderness.    Msk:  Symmetrical without gross deformities. Good, equal movement & strength bilaterally. Pulses:  Normal pulses noted. Extremities:  No clubbing or edema.  No cyanosis. Neurologic:  Alert and oriented x3;  grossly normal  neurologically. Skin:  Intact without significant lesions or rashes. No jaundice. Lymph Nodes:  No significant cervical adenopathy. Psych:  Alert and cooperative. Normal mood and affect.   Labs: CBC    Component Value Date/Time   WBC 9.2 05/06/2020 1130   WBC 8.7 03/06/2020 1542   RBC 4.48 05/06/2020 1130   RBC 4.43 03/06/2020 1542   HGB 13.3 05/06/2020 1130   HCT 40.6 05/06/2020 1130   PLT 259 05/06/2020 1130   MCV 91 05/06/2020 1130   MCV 87 09/09/2012 1413   MCH 29.7 05/06/2020 1130   MCH 30.0 03/06/2020 1542   MCHC 32.8 05/06/2020  1130   MCHC 34.0 03/06/2020 1542   RDW 12.0 05/06/2020 1130   RDW 12.2 09/09/2012 1413   LYMPHSABS 1.2 05/06/2020 1130   MONOABS 434 04/11/2017 1103   EOSABS 0.0 05/06/2020 1130   BASOSABS 0.0 05/06/2020 1130   CMP     Component Value Date/Time   NA 143 05/06/2020 1130   NA 144 09/09/2012 1413   K 4.1 05/06/2020 1130   K 3.5 09/09/2012 1413   CL 104 05/06/2020 1130   CL 107 09/09/2012 1413   CO2 25 05/06/2020 1130   CO2 29 09/09/2012 1413   GLUCOSE 146 (H) 05/06/2020 1130   GLUCOSE 140 (H) 03/06/2020 1542   GLUCOSE 94 09/09/2012 1413   BUN 13 05/06/2020 1130   BUN 7 09/09/2012 1413   CREATININE 1.01 (H) 05/06/2020 1130   CREATININE 0.93 07/20/2018 0959   CALCIUM 10.2 05/06/2020 1130   CALCIUM 8.7 09/09/2012 1413   PROT 7.8 05/06/2020 1130   ALBUMIN 4.6 05/06/2020 1130   AST 24 05/06/2020 1130   ALT 16 05/06/2020 1130   ALKPHOS 105 05/06/2020 1130   BILITOT 1.3 (H) 05/06/2020 1130   GFRNONAA 64 05/06/2020 1130   GFRNONAA 71 07/20/2018 0959   GFRAA 73 05/06/2020 1130   GFRAA 82 07/20/2018 0959    Imaging Studies: No results found.  Assessment and Plan:   Krista Kennedy is a 54 y.o. y/o female has been referred for chronic nausea, decreased appetite  Patient has never had a screening colonoscopy and is due for 1  Patient is continuing to have nausea despite PPI treatment and lab work being otherwise  reassuring  Patient agreeable to upper endoscopy to rule out any underlying lesions, especially since symptoms continue despite antiemetics  I have discussed alternative options, risks & benefits,  which include, but are not limited to, bleeding, infection, perforation,respiratory complication & drug reaction.  The patient agrees with this plan & written consent will be obtained.       Dr Krista Kennedy  Speech recognition software was used to dictate the above note.

## 2020-06-18 ENCOUNTER — Telehealth: Payer: Self-pay

## 2020-06-18 NOTE — Telephone Encounter (Signed)
Copied from [id number] [phone] [id number]. Topic: General - Other >> Jun 17, 2020  4:18 PM Celene Kras wrote: Reason for CRM: Pt called stating that she needs to have an appt. It was explained to pt that she has been dismissed and she states that since she dismissed hersefl she would like to see if Dr. Ronnald Ramp will accept her back. Please advise.

## 2020-06-18 NOTE — Telephone Encounter (Signed)
Please call pt and tell her that Dr Ronnald Ramp does not accept patient's back once they have been dismissed or left the practice.

## 2020-06-22 ENCOUNTER — Other Ambulatory Visit: Payer: Self-pay

## 2020-06-22 ENCOUNTER — Encounter: Payer: Self-pay | Admitting: Gastroenterology

## 2020-06-25 ENCOUNTER — Other Ambulatory Visit: Admission: RE | Admit: 2020-06-25 | Payer: Commercial Managed Care - PPO | Source: Ambulatory Visit

## 2020-06-26 ENCOUNTER — Other Ambulatory Visit
Admission: RE | Admit: 2020-06-26 | Discharge: 2020-06-26 | Disposition: A | Discharge status: Home / Self Care | Payer: Commercial Managed Care - PPO | Source: Ambulatory Visit | Attending: Gastroenterology | Admitting: Gastroenterology

## 2020-06-26 ENCOUNTER — Other Ambulatory Visit: Payer: Self-pay

## 2020-06-26 DIAGNOSIS — Z20822 Contact with and (suspected) exposure to covid-19: Secondary | ICD-10-CM | POA: Diagnosis not present

## 2020-06-26 DIAGNOSIS — Z01812 Encounter for preprocedural laboratory examination: Secondary | ICD-10-CM | POA: Insufficient documentation

## 2020-06-26 NOTE — Anesthesia Preprocedure Evaluation (Addendum)
Anesthesia Evaluation  Patient identified by MRN, date of birth, ID band Patient awake    Reviewed: Allergy & Precautions, NPO status , Patient's Chart, lab work & pertinent test results, reviewed documented beta blocker date and time   History of Anesthesia Complications Negative for: history of anesthetic complications  Airway Mallampati: III  TM Distance: >3 FB Neck ROM: Full    Dental   Pulmonary former smoker,    breath sounds clear to auscultation       Cardiovascular hypertension, (-) angina+CHF (Dilated cardiomyopathy/peripartum in 2002)  (-) DOE  Rhythm:Regular Rate:Normal   HLD  TTE (11/2019): LVEF 40-45%, global hypokinesis, mild LVH, Grade I diastolic dysfunction Normal RV function No significant valvular disease   Neuro/Psych PSYCHIATRIC DISORDERS Depression  Chronic opioid use 2/2 back pain  Neuromuscular disease (Transient weakness of L leg)    GI/Hepatic GERD  Controlled,  Endo/Other  Hypothyroidism   Renal/GU      Musculoskeletal  (+) Arthritis  (Psoriatic),   Abdominal   Peds  Hematology   Anesthesia Other Findings Psoriasis  Reproductive/Obstetrics                            Anesthesia Physical Anesthesia Plan  ASA: III  Anesthesia Plan: General   Post-op Pain Management:    Induction: Intravenous  PONV Risk Score and Plan: 3 and Propofol infusion, TIVA and Treatment may vary due to age or medical condition  Airway Management Planned: Natural Airway and Nasal Cannula  Additional Equipment:   Intra-op Plan:   Post-operative Plan:   Informed Consent: I have reviewed the patients History and Physical, chart, labs and discussed the procedure including the risks, benefits and alternatives for the proposed anesthesia with the patient or authorized representative who has indicated his/her understanding and acceptance.       Plan Discussed with: CRNA and  Anesthesiologist  Anesthesia Plan Comments:         Anesthesia Quick Evaluation

## 2020-06-27 LAB — SARS CORONAVIRUS 2 (TAT 6-24 HRS): SARS Coronavirus 2: NEGATIVE

## 2020-06-29 ENCOUNTER — Other Ambulatory Visit: Payer: Self-pay | Admitting: Internal Medicine

## 2020-06-29 DIAGNOSIS — E079 Disorder of thyroid, unspecified: Secondary | ICD-10-CM

## 2020-06-29 NOTE — Discharge Instructions (Signed)
General Anesthesia, Adult, Care After This sheet gives you information about how to care for yourself after your procedure. Your health care provider may also give you more specific instructions. If you have problems or questions, contact your health care provider. What can I expect after the procedure? After the procedure, the following side effects are common:  Pain or discomfort at the IV site.  Nausea.  Vomiting.  Sore throat.  Trouble concentrating.  Feeling cold or chills.  Weak or tired.  Sleepiness and fatigue.  Soreness and body aches. These side effects can affect parts of the body that were not involved in surgery. Follow these instructions at home:  For at least 24 hours after the procedure:  Have a responsible adult stay with you. It is important to have someone help care for you until you are awake and alert.  Rest as needed.  Do not: ? Participate in activities in which you could fall or become injured. ? Drive. ? Use heavy machinery. ? Drink alcohol. ? Take sleeping pills or medicines that cause drowsiness. ? Make important decisions or sign legal documents. ? Take care of children on your own. Eating and drinking  Follow any instructions from your health care provider about eating or drinking restrictions.  When you feel hungry, start by eating small amounts of foods that are soft and easy to digest (bland), such as toast. Gradually return to your regular diet.  Drink enough fluid to keep your urine pale yellow.  If you vomit, rehydrate by drinking water, juice, or clear broth. General instructions  If you have sleep apnea, surgery and certain medicines can increase your risk for breathing problems. Follow instructions from your health care provider about wearing your sleep device: ? Anytime you are sleeping, including during daytime naps. ? While taking prescription pain medicines, sleeping medicines, or medicines that make you drowsy.  Return to  your normal activities as told by your health care provider. Ask your health care provider what activities are safe for you.  Take over-the-counter and prescription medicines only as told by your health care provider.  If you smoke, do not smoke without supervision.  Keep all follow-up visits as told by your health care provider. This is important. Contact a health care provider if:  You have nausea or vomiting that does not get better with medicine.  You cannot eat or drink without vomiting.  You have pain that does not get better with medicine.  You are unable to pass urine.  You develop a skin rash.  You have a fever.  You have redness around your IV site that gets worse. Get help right away if:  You have difficulty breathing.  You have chest pain.  You have blood in your urine or stool, or you vomit blood. Summary  After the procedure, it is common to have a sore throat or nausea. It is also common to feel tired.  Have a responsible adult stay with you for the first 24 hours after general anesthesia. It is important to have someone help care for you until you are awake and alert.  When you feel hungry, start by eating small amounts of foods that are soft and easy to digest (bland), such as toast. Gradually return to your regular diet.  Drink enough fluid to keep your urine pale yellow.  Return to your normal activities as told by your health care provider. Ask your health care provider what activities are safe for you. This information is not   intended to replace advice given to you by your health care provider. Make sure you discuss any questions you have with your health care provider. Document Revised: 08/25/2017 Document Reviewed: 04/07/2017 Elsevier Patient Education  2020 [employer] Inc[employer]  

## 2020-06-30 ENCOUNTER — Ambulatory Visit: Payer: Commercial Managed Care - PPO | Admitting: Anesthesiology

## 2020-06-30 ENCOUNTER — Ambulatory Visit
Admission: RE | Disposition: A | Discharge status: Home / Self Care | Payer: Self-pay | Source: Home / Self Care | Attending: Gastroenterology

## 2020-06-30 ENCOUNTER — Ambulatory Visit
Admission: RE | Admit: 2020-06-30 | Discharge: 2020-06-30 | Disposition: A | Discharge status: Home / Self Care | Payer: Commercial Managed Care - PPO | Attending: Gastroenterology | Admitting: Gastroenterology

## 2020-06-30 ENCOUNTER — Telehealth: Payer: Self-pay | Admitting: Anesthesiology

## 2020-06-30 ENCOUNTER — Other Ambulatory Visit: Payer: Self-pay

## 2020-06-30 DIAGNOSIS — Z882 Allergy status to sulfonamides status: Secondary | ICD-10-CM | POA: Insufficient documentation

## 2020-06-30 DIAGNOSIS — K2970 Gastritis, unspecified, without bleeding: Secondary | ICD-10-CM | POA: Diagnosis not present

## 2020-06-30 DIAGNOSIS — R12 Heartburn: Secondary | ICD-10-CM | POA: Diagnosis present

## 2020-06-30 DIAGNOSIS — D490 Neoplasm of unspecified behavior of digestive system: Secondary | ICD-10-CM | POA: Insufficient documentation

## 2020-06-30 DIAGNOSIS — R112 Nausea with vomiting, unspecified: Secondary | ICD-10-CM | POA: Diagnosis not present

## 2020-06-30 DIAGNOSIS — K319 Disease of stomach and duodenum, unspecified: Secondary | ICD-10-CM | POA: Diagnosis not present

## 2020-06-30 DIAGNOSIS — Z87891 Personal history of nicotine dependence: Secondary | ICD-10-CM | POA: Insufficient documentation

## 2020-06-30 HISTORY — PX: COLONOSCOPY WITH PROPOFOL: SHX5780

## 2020-06-30 HISTORY — DX: Presence of spectacles and contact lenses: Z97.3

## 2020-06-30 HISTORY — PX: ESOPHAGOGASTRODUODENOSCOPY (EGD) WITH PROPOFOL: SHX5813

## 2020-06-30 SURGERY — COLONOSCOPY WITH PROPOFOL
Anesthesia: General | Site: Mouth

## 2020-06-30 MED ORDER — PROPOFOL 10 MG/ML IV BOLUS
INTRAVENOUS | Status: DC | PRN
  Administered 2020-06-30: 20 mg via INTRAVENOUS
  Administered 2020-06-30: 50 mg via INTRAVENOUS
  Administered 2020-06-30 (×2): 30 mg via INTRAVENOUS
  Administered 2020-06-30: 100 mg via INTRAVENOUS

## 2020-06-30 MED ORDER — ONDANSETRON HCL 4 MG/2ML IJ SOLN: 4.0000 mg | Freq: Once | INTRAMUSCULAR | Status: DC | PRN

## 2020-06-30 MED ORDER — GLYCOPYRROLATE 0.2 MG/ML IJ SOLN
INTRAMUSCULAR | Status: DC | PRN
  Administered 2020-06-30: .1 mg via INTRAVENOUS

## 2020-06-30 MED ORDER — LIDOCAINE HCL (CARDIAC) PF 100 MG/5ML IV SOSY
PREFILLED_SYRINGE | INTRAVENOUS | Status: DC | PRN
  Administered 2020-06-30: 30 mg via INTRAVENOUS

## 2020-06-30 MED ORDER — ACETAMINOPHEN 10 MG/ML IV SOLN: 1000.0000 mg | Freq: Once | INTRAVENOUS | Status: DC | PRN

## 2020-06-30 MED ORDER — STERILE WATER FOR IRRIGATION IR SOLN: Status: DC | PRN

## 2020-06-30 MED ORDER — LACTATED RINGERS IV SOLN: INTRAVENOUS | Status: DC

## 2020-06-30 SURGICAL SUPPLY — 8 items
BLOCK BITE 60FR ADLT L/F GRN (MISCELLANEOUS) ×4 IMPLANT
FORCEPS BIOP RAD 4 LRG CAP 4 (CUTTING FORCEPS) ×4 IMPLANT
GOWN CVR UNV OPN BCK APRN NK (MISCELLANEOUS) ×6 IMPLANT
GOWN ISOL THUMB LOOP REG UNIV (MISCELLANEOUS) ×8
KIT PRC NS LF DISP ENDO (KITS) ×3 IMPLANT
KIT PROCEDURE OLYMPUS (KITS) ×4
MANIFOLD NEPTUNE II (INSTRUMENTS) ×4 IMPLANT
WATER STERILE IRR 250ML POUR (IV SOLUTION) ×4 IMPLANT

## 2020-06-30 NOTE — Anesthesia Procedure Notes (Signed)
Date/Time: 06/30/2020 9:46 AM Performed by: Cameron Ali, CRNA Pre-anesthesia Checklist: Patient identified, Emergency Drugs available, Suction available, Timeout performed and Patient being monitored Patient Re-evaluated:Patient Re-evaluated prior to induction Oxygen Delivery Method: Nasal cannula Placement Confirmation: positive ETCO2

## 2020-06-30 NOTE — Telephone Encounter (Signed)
Patient called today for med refill appt. Dr. Andree Elk is gone until November 8th. I explained again to patient that she needs to call for appt. When she fills 2nd script. She will be out of meds on 07-07-20. She has appt. Thru MyChart on 07-29-20. Can someone please call Dr. Andree Elk and see if he will send one month refill until patient can have appt. On 11-24. Please call patient and let her know.

## 2020-06-30 NOTE — Transfer of Care (Signed)
Immediate Anesthesia Transfer of Care Note  Patient: Krista Kennedy  Procedure(s) Performed: COLONOSCOPY WITH PROPOFOL (N/A ) ESOPHAGOGASTRODUODENOSCOPY (EGD) WITH BIOPSY (N/A Mouth) CANCELLED PROCEDURE  Patient Location: PACU  Anesthesia Type: General  Level of Consciousness: awake, alert  and patient cooperative  Airway and Oxygen Therapy: Patient Spontanous Breathing and Patient connected to supplemental oxygen  Post-op Assessment: Post-op Vital signs reviewed, Patient's Cardiovascular Status Stable, Respiratory Function Stable, Patent Airway and No signs of Nausea or vomiting  Post-op Vital Signs: Reviewed and stable  Complications: No complications documented.

## 2020-06-30 NOTE — H&P (Addendum)
Krista Antigua, MD [address], [place], [place], [place], [address] [address], [place], [place], [place], [address] Phone: [phone]  Fax: [phone]  Primary Care Physician:  Patient, No Pcp Per   Pre-Procedure History & Physical: HPI:  Krista Kennedy is a 54 y.o. female is here for  EGD.   Past Medical History:  Diagnosis Date  . Arthritis    Psoriatic  . Cardiomyopathy ([place])   . CHF (congestive heart failure) ([place])   . Hyperlipidemia   . Hypertension   . Psoriasis   . Thyroid disease   . Transient weakness of left leg 08/13/2019  . Wears contact lenses     Past Surgical History:  Procedure Laterality Date  . ENDOMETRIAL ABLATION  2015  . THYROIDECTOMY    . TONSILLECTOMY    . TUBAL LIGATION      Prior to Admission medications   Medication Sig Start Date End Date Taking? Authorizing Provider  Ascorbic Acid (VITAMIN C) 1000 MG tablet Take 1,000 mg by mouth daily.   Yes [provider]  carvedilol (COREG) 25 MG tablet Take 1 tablet (25 mg total) by mouth 2 (two) times daily with a meal. 06/01/20  Yes Gollan, Kathlene November, MD  Cholecalciferol (VITAMIN D-1000 MAX ST) 1000 UNITS tablet Take by mouth daily.    Yes [provider]  clobetasol (TEMOVATE) 0.05 % external solution Apply 1 application topically as needed.  03/22/17  Yes [provider]  estradiol (CLIMARA - DOSED IN MG/24 HR) 0.025 mg/24hr patch Place 1 patch (0.025 mg total) onto the skin once a week. 05/08/20  Yes Rod Can, CNM  fluticasone (FLONASE) 50 MCG/ACT nasal spray Place 2 sprays into both nostrils daily. 12/18/18  Yes Mikey College, NP  Ixekizumab (TALTZ) 80 MG/ML SOSY Inject 80 mg into the skin as directed. [facility]   Yes [provider]  levothyroxine (SYNTHROID) 100 MCG tablet Take 1 tablet (100 mcg total) by mouth daily before breakfast. 04/06/20  Yes Glean Hess, MD  lisinopril (ZESTRIL) 20 MG tablet TAKE 1 TABLET(20 MG) BY MOUTH  DAILY 06/01/20  Yes Gollan, Kathlene November, MD  Multiple Vitamin (MULTI VITAMIN PO) Take 1 tablet by mouth daily.   Yes [provider]  naloxone [facility]) nasal spray 4 mg/0.1 mL To reverse excess sedation from narcotics 12/28/17  Yes Molli Barrows, MD  oxyCODONE-acetaminophen (PERCOCET) 10-325 MG tablet Take 1 tablet by mouth every 6 (six) hours as needed for pain. 06/07/20 07/07/20 Yes Molli Barrows, MD  promethazine (PHENERGAN) 12.5 MG tablet Take 1 tablet (12.5 mg total) by mouth every 6 (six) hours as needed for up to 30 doses for nausea or vomiting. 06/08/20  Yes Virgel Manifold, MD  spironolactone (ALDACTONE) 25 MG tablet Take 1 tablet (25 mg total) by mouth daily. 06/01/20 08/30/20 Yes Gollan, Kathlene November, MD  Na Sulfate-K Sulfate-Mg Sulf 17.5-3.13-1.6 GM/177ML SOLN At 5 PM the day before procedure take 1 bottle and 5 hours before procedure take 1 bottle. 06/08/20   Virgel Manifold, MD  pantoprazole (PROTONIX) 40 MG tablet Take 1 tablet (40 mg total) by mouth daily. Patient not taking: Reported on 06/22/2020 04/02/20   Juline Patch, MD  progesterone (PROMETRIUM) 100 MG capsule TAKE 1 CAPSULE(100 MG) BY MOUTH DAILY Patient not taking: TAKE 1 CAPSULE(100 MG) BY MOUTH DAILY 10/07/19   Olin Hauser, DO    Allergies as of 06/08/2020 - Review Complete 06/08/2020  Allergen Reaction Noted  . Other  01/11/2019  . Sulfa antibiotics Hives 04/30/2013  . Sulfacetamide sodium  08/06/2015    Family History  Problem Relation Age of Onset  . Hypertension Mother   . Heart disease Father        CABG   . Hyperlipidemia Father   . Hypertension Father   . Diabetes Father     Social History   Socioeconomic History  . Marital status: Married    Spouse name: Not on file  . Number of children: Not on file  . Years of education: Not on file  . Highest education level: Not on file  Occupational History  . Not on file  Tobacco Use  . Smoking status: Former Smoker    Packs/day:  0.25    Years: 15.00    Pack years: 3.75    Types: Cigarettes    Quit date: 2012    Years since quitting: 9.8  . Smokeless tobacco: Former Network engineer  . Vaping Use: Former  . Substances: Flavoring  Substance and Sexual Activity  . Alcohol use: Not Currently    Comment: social  . Drug use: No  . Sexual activity: Yes    Birth control/protection: None  Other Topics Concern  . Not on file  Social History Narrative  . Not on file   Social Determinants of Health   Financial Resource Strain:   . Difficulty of Paying Living Expenses: Not on file  Food Insecurity:   . Worried About Charity fundraiser in the Last Year: Not on file  . Ran Out of Food in the Last Year: Not on file  Transportation Needs:   . Lack of Transportation (Medical): Not on file  . Lack of Transportation (Non-Medical): Not on file  Physical Activity:   . Days of Exercise per Week: Not on file  . Minutes of Exercise per Session: Not on file  Stress:   . Feeling of Stress : Not on file  Social Connections:   . Frequency of Communication with Friends and Family: Not on file  . Frequency of Social Gatherings with Friends and Family: Not on file  . Attends Religious Services: Not on file  . Active Member of Clubs or Organizations: Not on file  . Attends Archivist Meetings: Not on file  . Marital Status: Not on file  Intimate Partner Violence:   . Fear of Current or Ex-Partner: Not on file  . Emotionally Abused: Not on file  . Physically Abused: Not on file  . Sexually Abused: Not on file    Review of Systems: See HPI, otherwise negative ROS  Physical Exam: BP 129/74   Pulse 87   Temp (!) 97.5 F (36.4 C) (Temporal)   Ht 5\' 10"  (1.778 m)   Wt 74.8 kg   SpO2 100%   BMI 23.68 kg/m  General:   Alert,  pleasant and cooperative in NAD Head:  Normocephalic and atraumatic. Neck:  Supple; no masses or thyromegaly. Lungs:  Clear throughout to auscultation, normal respiratory effort.      Heart:  +S1, +S2, Regular rate and rhythm, No edema. Abdomen:  Soft, nontender and nondistended. Normal bowel sounds, without guarding, and without rebound.   Neurologic:  Alert and  oriented x4;  grossly normal neurologically.  Impression/Plan: Krista Kennedy is here for a an EGD for Acid Reflux, nausea.  Colonoscopy was cancelled as pt only drank half the prep and reports output to consist of stool this morning. This would lead to a  less than ideal prep and not make for an effective screening exam. After discussing risks and benefits in this setting pt would like to reschedule colonoscopy  Risks, benefits, limitations, and alternatives regarding the procedures have been reviewed with the patient.  Questions have been answered.  All parties agreeable.   Virgel Manifold, MD  06/30/2020, 9:32 AM

## 2020-06-30 NOTE — Op Note (Signed)
[facility] Patient Name: Krista Kennedy Procedure Date: 06/30/2020 9:41 AM MRN: [id number] Account #: [id number] Date of Birth: [birth year] Admit Type: Outpatient Age: 54 Room: [facility] OR ROOM 01 Gender: Female Note Status: Finalized Procedure:             Upper GI endoscopy Indications:           Heartburn, Nausea with vomiting Providers:             Bensen Chadderdon B. Bonna Gains MD, MD Referring MD:          Minna Merritts, MD (Referring MD) Medicines:             Monitored Anesthesia Care Complications:         No immediate complications. Procedure:             Pre-Anesthesia Assessment:                        - Prior to the procedure, a History and Physical was                         performed, and patient medications, allergies and                         sensitivities were reviewed. The patient's tolerance                         of previous anesthesia was reviewed.                        - The risks and benefits of the procedure and the                         sedation options and risks were discussed with the                         patient. All questions were answered and informed                         consent was obtained.                        - Patient identification and proposed procedure were                         verified prior to the procedure by the physician, the                         nurse, the anesthesiologist, the anesthetist and the                         technician. The procedure was verified in the                         procedure room.                        - ASA Grade Assessment: II - A patient with mild  systemic disease.                        After obtaining informed consent, the endoscope was                         passed under direct vision. Throughout the procedure,                         the patient's blood pressure, pulse, and oxygen                         saturations were monitored  continuously. The was                         introduced through the mouth, and advanced to the                         second part of duodenum. The upper GI endoscopy was                         accomplished with ease. The patient tolerated the                         procedure well. Findings:      The examined esophagus was normal.      Patchy mildly erythematous mucosa without bleeding was found in the       gastric antrum. Biopsies were taken with a cold forceps for histology.       Biopsies were obtained in the gastric body, at the incisura and in the       gastric antrum with cold forceps for histology.      A single 15 mm submucosal papule (nodule) was found in the stomach.      The exam of the stomach was otherwise normal.      The duodenal bulb, second portion of the duodenum and examined duodenum       were normal. Impression:            - Normal esophagus.                        - Erythematous mucosa in the antrum. Biopsied.                        - A single submucosal papule (nodule) found in the                         stomach.                        - Normal duodenal bulb, second portion of the duodenum                         and examined duodenum.                        - Biopsies were obtained in the gastric body, at the                         incisura and in the gastric antrum.  Recommendation:        - Referral to Dr. Rush Landmark for EUS of gastric lesion.                        - Await pathology results.                        - Discharge patient to home (with escort).                        - Advance diet as tolerated.                        - Continue present medications.                        - Patient has a contact number available for                         emergencies. The signs and symptoms of potential                         delayed complications were discussed with the patient.                         Return to normal activities tomorrow. Written                          discharge instructions were provided to the patient.                        - Discharge patient to home (with escort).                        - The findings and recommendations were discussed with                         the patient.                        - The findings and recommendations were discussed with                         the patient's family. Procedure Code(s):     --- Professional ---                        [phone], Esophagogastroduodenoscopy, flexible,                         transoral; with biopsy, single or multiple Diagnosis Code(s):     --- Professional ---                        K31.89, Other diseases of stomach and duodenum                        R12, Heartburn                        R11.2, Nausea with vomiting, unspecified CPT copyright 2019 American Medical Association. All rights reserved. The codes documented in  this report are preliminary and upon coder review may  be revised to meet current compliance requirements.  Vonda Antigua, MD Margretta Sidle B. Bonna Gains MD, MD 06/30/2020 10:05:07 AM This report has been signed electronically. Number of Addenda: 0 Note Initiated On: 06/30/2020 9:41 AM Estimated Blood Loss:  Estimated blood loss: none.      [facility]

## 2020-06-30 NOTE — Anesthesia Postprocedure Evaluation (Signed)
Anesthesia Post Note  Patient: Krista Kennedy  Procedure(s) Performed: COLONOSCOPY WITH PROPOFOL (N/A ) ESOPHAGOGASTRODUODENOSCOPY (EGD) WITH BIOPSY (N/A Mouth) CANCELLED PROCEDURE     Patient location during evaluation: PACU Anesthesia Type: General Level of consciousness: awake and alert Pain management: pain level controlled Vital Signs Assessment: post-procedure vital signs reviewed and stable Respiratory status: spontaneous breathing, nonlabored ventilation, respiratory function stable and patient connected to nasal cannula oxygen Cardiovascular status: blood pressure returned to baseline and stable Postop Assessment: no apparent nausea or vomiting Anesthetic complications: no   No complications documented.  Ashwath Lasch A  Amany Rando

## 2020-07-01 ENCOUNTER — Other Ambulatory Visit: Payer: Self-pay

## 2020-07-01 ENCOUNTER — Other Ambulatory Visit: Payer: Self-pay | Admitting: Gastroenterology

## 2020-07-01 ENCOUNTER — Telehealth: Payer: Self-pay

## 2020-07-01 DIAGNOSIS — R110 Nausea: Secondary | ICD-10-CM

## 2020-07-01 DIAGNOSIS — Z1211 Encounter for screening for malignant neoplasm of colon: Secondary | ICD-10-CM

## 2020-07-01 DIAGNOSIS — K319 Disease of stomach and duodenum, unspecified: Secondary | ICD-10-CM

## 2020-07-01 LAB — SURGICAL PATHOLOGY

## 2020-07-01 MED ORDER — CLENPIQ 10-3.5-12 MG-GM -GM/160ML PO SOLN: ORAL | 0 refills | Status: AC

## 2020-07-01 MED ORDER — PANTOPRAZOLE SODIUM 40 MG PO TBEC: 40.0000 mg | DELAYED_RELEASE_TABLET | Freq: Two times a day (BID) | ORAL | 0 refills | Status: DC

## 2020-07-01 NOTE — Telephone Encounter (Signed)
Please put patient on either Dr Dossie Arbour or Dr Holley Raring for medication refill.

## 2020-07-01 NOTE — Telephone Encounter (Signed)
-----   Message from Virgel Manifold, MD sent at 06/30/2020 10:06 AM EDT ----- Reschedule colonoscopy for screening as pt did not finish prep. Try different prep. Clenpiq if she used something else as it is lower volume and might be better for her nausea. Also refer to dr. Rush Landmark for EUS for gastric lesion (see egd report)

## 2020-07-01 NOTE — Telephone Encounter (Signed)
Called patient to reschedule her colonoscopy and she agreed on getting done on 07/20/2020 at [facility]. I gave patient instructions and that she needed to go to have her COVID-19 test done on Thursday 07/16/2020 at the [facility]. I also informed patient that I would be sending her instructions through MyChart as well. Patient agreed and had no questions. Clenpiq will be sent to patient per Dr. Bonna Gains.

## 2020-07-02 ENCOUNTER — Ambulatory Visit
Payer: Commercial Managed Care - PPO | Attending: Student in an Organized Health Care Education/Training Program | Admitting: Student in an Organized Health Care Education/Training Program

## 2020-07-02 ENCOUNTER — Encounter: Payer: Self-pay | Admitting: Gastroenterology

## 2020-07-02 ENCOUNTER — Other Ambulatory Visit: Payer: Self-pay

## 2020-07-02 VITALS — BP 138/89 | HR 95 | Temp 98.2°F | Ht 70.0 in | Wt 165.0 lb

## 2020-07-02 DIAGNOSIS — G894 Chronic pain syndrome: Secondary | ICD-10-CM | POA: Diagnosis present

## 2020-07-02 DIAGNOSIS — M25512 Pain in left shoulder: Secondary | ICD-10-CM | POA: Diagnosis present

## 2020-07-02 DIAGNOSIS — M5441 Lumbago with sciatica, right side: Secondary | ICD-10-CM | POA: Diagnosis present

## 2020-07-02 DIAGNOSIS — M25532 Pain in left wrist: Secondary | ICD-10-CM | POA: Diagnosis present

## 2020-07-02 DIAGNOSIS — F1190 Opioid use, unspecified, uncomplicated: Secondary | ICD-10-CM | POA: Insufficient documentation

## 2020-07-02 DIAGNOSIS — M533 Sacrococcygeal disorders, not elsewhere classified: Secondary | ICD-10-CM | POA: Insufficient documentation

## 2020-07-02 DIAGNOSIS — M25552 Pain in left hip: Secondary | ICD-10-CM | POA: Diagnosis present

## 2020-07-02 DIAGNOSIS — L4050 Arthropathic psoriasis, unspecified: Secondary | ICD-10-CM | POA: Diagnosis present

## 2020-07-02 DIAGNOSIS — G8929 Other chronic pain: Secondary | ICD-10-CM | POA: Diagnosis present

## 2020-07-02 DIAGNOSIS — M5442 Lumbago with sciatica, left side: Secondary | ICD-10-CM | POA: Diagnosis not present

## 2020-07-02 DIAGNOSIS — M25511 Pain in right shoulder: Secondary | ICD-10-CM | POA: Diagnosis not present

## 2020-07-02 DIAGNOSIS — M25549 Pain in joints of unspecified hand: Secondary | ICD-10-CM | POA: Insufficient documentation

## 2020-07-02 MED ORDER — OXYCODONE-ACETAMINOPHEN 10-325 MG PO TABS: 1.0000 | ORAL_TABLET | Freq: Four times a day (QID) | ORAL | 0 refills | Status: AC | PRN

## 2020-07-02 MED ORDER — OXYCODONE-ACETAMINOPHEN 10-325 MG PO TABS: 1.0000 | ORAL_TABLET | Freq: Four times a day (QID) | ORAL | 0 refills | Status: DC | PRN

## 2020-07-02 NOTE — Progress Notes (Signed)
Safety precautions to be maintained throughout the outpatient stay will include: orient to surroundings, keep bed in low position, maintain call bell within reach at all times, provide assistance with transfer out of bed and ambulation.  
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
## 2020-07-07 ENCOUNTER — Encounter: Payer: Self-pay | Admitting: Gastroenterology

## 2020-07-07 ENCOUNTER — Other Ambulatory Visit: Payer: Self-pay

## 2020-07-07 MED ORDER — PROMETHAZINE HCL 12.5 MG PO TABS: 12.5000 mg | ORAL_TABLET | Freq: Four times a day (QID) | ORAL | 0 refills | Status: DC | PRN

## 2020-07-08 ENCOUNTER — Telehealth: Payer: Self-pay

## 2020-07-08 MED ORDER — OMEPRAZOLE 20 MG PO CPDR: 20.0000 mg | DELAYED_RELEASE_CAPSULE | Freq: Two times a day (BID) | ORAL | 0 refills | Status: DC

## 2020-07-08 NOTE — Telephone Encounter (Signed)
Patient insurance did not approve her Pantoprazole. What other medication would you like for her to try and hoping it gets approved. Please advise.

## 2020-07-08 NOTE — Telephone Encounter (Signed)
Virgel Manifold, MD  You 3 hours ago (1:27 PM)   Omeprazole 20 mg BID 30 days, no refill.

## 2020-07-14 ENCOUNTER — Encounter: Payer: Self-pay | Admitting: Gastroenterology

## 2020-07-14 ENCOUNTER — Other Ambulatory Visit: Payer: Self-pay

## 2020-07-16 ENCOUNTER — Other Ambulatory Visit
Admission: RE | Admit: 2020-07-16 | Discharge: 2020-07-16 | Disposition: A | Discharge status: Home / Self Care | Payer: Commercial Managed Care - PPO | Source: Ambulatory Visit | Attending: Gastroenterology | Admitting: Gastroenterology

## 2020-07-16 ENCOUNTER — Other Ambulatory Visit: Payer: Self-pay

## 2020-07-16 DIAGNOSIS — Z01812 Encounter for preprocedural laboratory examination: Secondary | ICD-10-CM | POA: Insufficient documentation

## 2020-07-16 DIAGNOSIS — Z20822 Contact with and (suspected) exposure to covid-19: Secondary | ICD-10-CM | POA: Insufficient documentation

## 2020-07-17 LAB — SARS CORONAVIRUS 2 (TAT 6-24 HRS): SARS Coronavirus 2: NEGATIVE

## 2020-07-17 NOTE — Discharge Instructions (Signed)
General Anesthesia, Adult, Care After This sheet gives you information about how to care for yourself after your procedure. Your health care provider may also give you more specific instructions. If you have problems or questions, contact your health care provider. What can I expect after the procedure? After the procedure, the following side effects are common:  Pain or discomfort at the IV site.  Nausea.  Vomiting.  Sore throat.  Trouble concentrating.  Feeling cold or chills.  Weak or tired.  Sleepiness and fatigue.  Soreness and body aches. These side effects can affect parts of the body that were not involved in surgery. Follow these instructions at home:  For at least 24 hours after the procedure:  Have a responsible adult stay with you. It is important to have someone help care for you until you are awake and alert.  Rest as needed.  Do not: ? Participate in activities in which you could fall or become injured. ? Drive. ? Use heavy machinery. ? Drink alcohol. ? Take sleeping pills or medicines that cause drowsiness. ? Make important decisions or sign legal documents. ? Take care of children on your own. Eating and drinking  Follow any instructions from your health care provider about eating or drinking restrictions.  When you feel hungry, start by eating small amounts of foods that are soft and easy to digest (bland), such as toast. Gradually return to your regular diet.  Drink enough fluid to keep your urine pale yellow.  If you vomit, rehydrate by drinking water, juice, or clear broth. General instructions  If you have sleep apnea, surgery and certain medicines can increase your risk for breathing problems. Follow instructions from your health care provider about wearing your sleep device: ? Anytime you are sleeping, including during daytime naps. ? While taking prescription pain medicines, sleeping medicines, or medicines that make you drowsy.  Return to  your normal activities as told by your health care provider. Ask your health care provider what activities are safe for you.  Take over-the-counter and prescription medicines only as told by your health care provider.  If you smoke, do not smoke without supervision.  Keep all follow-up visits as told by your health care provider. This is important. Contact a health care provider if:  You have nausea or vomiting that does not get better with medicine.  You cannot eat or drink without vomiting.  You have pain that does not get better with medicine.  You are unable to pass urine.  You develop a skin rash.  You have a fever.  You have redness around your IV site that gets worse. Get help right away if:  You have difficulty breathing.  You have chest pain.  You have blood in your urine or stool, or you vomit blood. Summary  After the procedure, it is common to have a sore throat or nausea. It is also common to feel tired.  Have a responsible adult stay with you for the first 24 hours after general anesthesia. It is important to have someone help care for you until you are awake and alert.  When you feel hungry, start by eating small amounts of foods that are soft and easy to digest (bland), such as toast. Gradually return to your regular diet.  Drink enough fluid to keep your urine pale yellow.  Return to your normal activities as told by your health care provider. Ask your health care provider what activities are safe for you. This information is not   intended to replace advice given to you by your health care provider. Make sure you discuss any questions you have with your health care provider. Document Revised: 08/25/2017 Document Reviewed: 04/07/2017 Elsevier Patient Education  2020 [employer] Inc[employer]  

## 2020-07-20 ENCOUNTER — Ambulatory Visit: Payer: Commercial Managed Care - PPO | Admitting: Anesthesiology

## 2020-07-20 ENCOUNTER — Encounter: Payer: Self-pay | Admitting: Gastroenterology

## 2020-07-20 ENCOUNTER — Other Ambulatory Visit: Payer: Self-pay

## 2020-07-20 ENCOUNTER — Ambulatory Visit
Admission: RE | Admit: 2020-07-20 | Discharge: 2020-07-20 | Disposition: A | Discharge status: Home / Self Care | Payer: Commercial Managed Care - PPO | Attending: Gastroenterology | Admitting: Gastroenterology

## 2020-07-20 ENCOUNTER — Encounter
Admission: RE | Disposition: A | Discharge status: Home / Self Care | Payer: Self-pay | Source: Home / Self Care | Attending: Gastroenterology

## 2020-07-20 DIAGNOSIS — L4050 Arthropathic psoriasis, unspecified: Secondary | ICD-10-CM | POA: Insufficient documentation

## 2020-07-20 DIAGNOSIS — Z8349 Family history of other endocrine, nutritional and metabolic diseases: Secondary | ICD-10-CM | POA: Diagnosis not present

## 2020-07-20 DIAGNOSIS — I429 Cardiomyopathy, unspecified: Secondary | ICD-10-CM | POA: Diagnosis not present

## 2020-07-20 DIAGNOSIS — Z833 Family history of diabetes mellitus: Secondary | ICD-10-CM | POA: Insufficient documentation

## 2020-07-20 DIAGNOSIS — E785 Hyperlipidemia, unspecified: Secondary | ICD-10-CM | POA: Insufficient documentation

## 2020-07-20 DIAGNOSIS — Z79899 Other long term (current) drug therapy: Secondary | ICD-10-CM | POA: Diagnosis not present

## 2020-07-20 DIAGNOSIS — Z8249 Family history of ischemic heart disease and other diseases of the circulatory system: Secondary | ICD-10-CM | POA: Insufficient documentation

## 2020-07-20 DIAGNOSIS — I509 Heart failure, unspecified: Secondary | ICD-10-CM | POA: Insufficient documentation

## 2020-07-20 DIAGNOSIS — I110 Hypertensive heart disease with heart failure: Secondary | ICD-10-CM | POA: Diagnosis not present

## 2020-07-20 DIAGNOSIS — Z1211 Encounter for screening for malignant neoplasm of colon: Secondary | ICD-10-CM | POA: Diagnosis present

## 2020-07-20 DIAGNOSIS — Z882 Allergy status to sulfonamides status: Secondary | ICD-10-CM | POA: Diagnosis not present

## 2020-07-20 DIAGNOSIS — Z87891 Personal history of nicotine dependence: Secondary | ICD-10-CM | POA: Diagnosis not present

## 2020-07-20 DIAGNOSIS — Z5309 Procedure and treatment not carried out because of other contraindication: Secondary | ICD-10-CM | POA: Insufficient documentation

## 2020-07-20 HISTORY — PX: COLONOSCOPY WITH PROPOFOL: SHX5780

## 2020-07-20 LAB — POCT PREGNANCY, URINE: Preg Test, Ur: NEGATIVE

## 2020-07-20 SURGERY — COLONOSCOPY WITH PROPOFOL
Anesthesia: General | Site: Rectum

## 2020-07-20 MED ORDER — PROPOFOL 10 MG/ML IV BOLUS
INTRAVENOUS | Status: DC | PRN
  Administered 2020-07-20: 150 mg via INTRAVENOUS
  Administered 2020-07-20 (×2): 40 mg via INTRAVENOUS

## 2020-07-20 MED ORDER — PROMETHAZINE HCL 12.5 MG PO TABS: 12.5000 mg | ORAL_TABLET | Freq: Four times a day (QID) | ORAL | 0 refills | Status: DC | PRN

## 2020-07-20 MED ORDER — OMEPRAZOLE 40 MG PO CPDR: 40.0000 mg | DELAYED_RELEASE_CAPSULE | Freq: Every day | ORAL | 0 refills | Status: AC

## 2020-07-20 MED ORDER — LIDOCAINE HCL (CARDIAC) PF 100 MG/5ML IV SOSY
PREFILLED_SYRINGE | INTRAVENOUS | Status: DC | PRN
  Administered 2020-07-20: 30 mg via INTRAVENOUS

## 2020-07-20 MED ORDER — LACTATED RINGERS IV SOLN: INTRAVENOUS | Status: DC

## 2020-07-20 MED ORDER — SODIUM CHLORIDE 0.9 % IV SOLN: INTRAVENOUS | Status: DC

## 2020-07-20 SURGICAL SUPPLY — 25 items
CLIP HMST 235XBRD CATH ROT (MISCELLANEOUS) IMPLANT
CLIP RESOLUTION 360 11X235 (MISCELLANEOUS)
ELECT REM PT RETURN 9FT ADLT (ELECTROSURGICAL)
ELECTRODE REM PT RTRN 9FT ADLT (ELECTROSURGICAL) IMPLANT
FCP ESCP3.2XJMB 240X2.8X (MISCELLANEOUS)
FORCEPS BIOP RAD 4 LRG CAP 4 (CUTTING FORCEPS) IMPLANT
FORCEPS BIOP RJ4 240 W/NDL (MISCELLANEOUS)
FORCEPS ESCP3.2XJMB 240X2.8X (MISCELLANEOUS) IMPLANT
GOWN CVR UNV OPN BCK APRN NK (MISCELLANEOUS) ×2 IMPLANT
GOWN ISOL THUMB LOOP REG UNIV (MISCELLANEOUS) ×4
INJECTOR VARIJECT VIN23 (MISCELLANEOUS) IMPLANT
KIT DEFENDO VALVE AND CONN (KITS) IMPLANT
KIT PRC NS LF DISP ENDO (KITS) ×1 IMPLANT
KIT PROCEDURE OLYMPUS (KITS) ×2
MANIFOLD NEPTUNE II (INSTRUMENTS) ×2 IMPLANT
MARKER SPOT ENDO TATTOO 5ML (MISCELLANEOUS) IMPLANT
PROBE APC STR FIRE (PROBE) IMPLANT
RETRIEVER NET ROTH 2.5X230 LF (MISCELLANEOUS) IMPLANT
SNARE SHORT THROW 13M SML OVAL (MISCELLANEOUS) IMPLANT
SNARE SHORT THROW 30M LRG OVAL (MISCELLANEOUS) IMPLANT
SNARE SNG USE RND 15MM (INSTRUMENTS) IMPLANT
SPOT EX ENDOSCOPIC TATTOO (MISCELLANEOUS)
TRAP ETRAP POLY (MISCELLANEOUS) IMPLANT
VARIJECT INJECTOR VIN23 (MISCELLANEOUS)
WATER STERILE IRR 250ML POUR (IV SOLUTION) ×2 IMPLANT

## 2020-07-20 NOTE — Anesthesia Procedure Notes (Signed)
Date/Time: 07/20/2020 11:50 AM Performed by: Cameron Ali, CRNA Pre-anesthesia Checklist: Patient identified, Emergency Drugs available, Suction available, Timeout performed and Patient being monitored Patient Re-evaluated:Patient Re-evaluated prior to induction Oxygen Delivery Method: Nasal cannula Placement Confirmation: positive ETCO2

## 2020-07-20 NOTE — Transfer of Care (Signed)
Immediate Anesthesia Transfer of Care Note  Patient: Krista Kennedy  Procedure(s) Performed: COLONOSCOPY WITH PROPOFOL (N/A Rectum)  Patient Location: PACU  Anesthesia Type: General  Level of Consciousness: awake, alert  and patient cooperative  Airway and Oxygen Therapy: Patient Spontanous Breathing and Patient connected to supplemental oxygen  Post-op Assessment: Post-op Vital signs reviewed, Patient's Cardiovascular Status Stable, Respiratory Function Stable, Patent Airway and No signs of Nausea or vomiting  Post-op Vital Signs: Reviewed and stable  Complications: No complications documented.

## 2020-07-20 NOTE — Op Note (Signed)
[facility] Patient Name: Krista Kennedy Procedure Date: 07/20/2020 11:32 AM MRN: [id number] Account #: [id number] Date of Birth: 04-Jul-1966 Admit Type: Outpatient Age: 54 Room: [facility] OR ROOM 01 Gender: Female Note Status: Finalized Procedure:             Colonoscopy Indications:           Screening for colorectal malignant neoplasm Providers:             Brace Welte B. Bonna Gains MD, MD Medicines:             Monitored Anesthesia Care Complications:         No immediate complications. Procedure:             Pre-Anesthesia Assessment:                        - Prior to the procedure, a History and Physical was                         performed, and patient medications, allergies and                         sensitivities were reviewed. The patient's tolerance                         of previous anesthesia was reviewed.                        - The risks and benefits of the procedure and the                         sedation options and risks were discussed with the                         patient. All questions were answered and informed                         consent was obtained.                        - Patient identification and proposed procedure were                         verified prior to the procedure by the physician, the                         nurse, the anesthesiologist, the anesthetist and the                         technician. The procedure was verified in the                         pre-procedure area in the procedure room in the                         endoscopy suite.                        - Prophylactic Antibiotics: The patient does not  require prophylactic antibiotics.                        - ASA Grade Assessment: II - A patient with mild                         systemic disease.                        - After reviewing the risks and benefits, the patient                         was deemed in satisfactory  condition to undergo the                         procedure.                        - Monitored anesthesia care was determined to be                         medically necessary for this procedure based on review                         of the patient's medical history, medications, and                         prior anesthesia history.                        - The anesthesia plan was to use monitored anesthesia                         care (MAC).                        After obtaining informed consent, the colonoscope was                         passed under direct vision. Throughout the procedure,                         the patient's blood pressure, pulse, and oxygen                         saturations were monitored continuously. The was                         introduced through the anus with the intention of                         advancing to the cecum. The scope was advanced to the                         descending colon before the procedure was aborted.                         Medications were given. The colonoscopy was performed  with ease. The patient tolerated the procedure well.                         The quality of the bowel preparation was poor. Findings:      The perianal and digital rectal examinations were normal.      Stool was found in the rectum, in the sigmoid colon and in the       descending colon, precluding visualization.      The retroflexed view of the distal rectum and anal verge was normal and       showed no anal or rectal abnormalities. Impression:            - Preparation of the colon was poor.                        - Stool in the rectum, in the sigmoid colon and in the                         descending colon.                        - The distal rectum and anal verge are normal on                         retroflexion view.                        - No specimens collected. Recommendation:        - Repeat colonoscopy at the next  available appointment                         for screening purposes.                        - Continue present medications.                        - Return to my office as previously scheduled.                        - The findings and recommendations were discussed with                         the patient.                        - The findings and recommendations were discussed with                         the patient's family. Procedure Code(s):     --- Professional ---                        [phone], 12, Colonoscopy, flexible; diagnostic,                         including collection of specimen(s) by brushing or                         washing, when performed (separate procedure) Diagnosis Code(s):     --- Professional ---  Z12.11, Encounter for screening for malignant neoplasm                         of colon CPT copyright 2019 [employer]. All rights reserved. The codes documented in this report are preliminary and upon coder review may  be revised to meet current compliance requirements.  Vonda Antigua, MD Margretta Sidle B. Bonna Gains MD, MD 07/20/2020 12:02:39 PM This report has been signed electronically. Number of Addenda: 0 Note Initiated On: 07/20/2020 11:32 AM Total Procedure Duration: 0 hours 4 minutes 52 seconds  Estimated Blood Loss:  Estimated blood loss: none.      [facility]

## 2020-07-20 NOTE — H&P (Signed)
Krista Antigua, MD [address] [place], [place], [place], [address] [address], [place], [place], [place], [address] Phone: [phone]  Fax: [phone]  Primary Care Physician:  Patient, No Pcp Per   Pre-Procedure History & Physical: HPI:  Krista Kennedy is a 54 y.o. female is here for a colonoscopy.   Past Medical History:  Diagnosis Date  . Arthritis    Psoriatic  . Cardiomyopathy ([place])   . CHF (congestive heart failure) ([place])   . Hyperlipidemia   . Hypertension   . Psoriasis   . Thyroid disease   . Transient weakness of left leg 08/13/2019  . Wears contact lenses     Past Surgical History:  Procedure Laterality Date  . COLONOSCOPY WITH PROPOFOL N/A 06/30/2020   Procedure: COLONOSCOPY WITH PROPOFOL;  Surgeon: Virgel Manifold, MD;  Location: [facility];  Service: Endoscopy;  Laterality: N/A;  . ENDOMETRIAL ABLATION  2015  . ESOPHAGOGASTRODUODENOSCOPY (EGD) WITH PROPOFOL N/A 06/30/2020   Procedure: ESOPHAGOGASTRODUODENOSCOPY (EGD) WITH BIOPSY;  Surgeon: Virgel Manifold, MD;  Location: [facility];  Service: Endoscopy;  Laterality: N/A;  . THYROIDECTOMY    . TONSILLECTOMY    . TUBAL LIGATION      Prior to Admission medications   Medication Sig Start Date End Date Taking? Authorizing Provider  Ascorbic Acid (VITAMIN C) 1000 MG tablet Take 1,000 mg by mouth daily.    [provider]  carvedilol (COREG) 25 MG tablet Take 1 tablet (25 mg total) by mouth 2 (two) times daily with a meal. 06/01/20   Gollan, Kathlene November, MD  Cholecalciferol (VITAMIN D-1000 MAX ST) 1000 UNITS tablet Take by mouth daily.     [provider]  clobetasol (TEMOVATE) 0.05 % external solution Apply 1 application topically as needed.  03/22/17   [provider]  estradiol (CLIMARA - DOSED IN MG/24 HR) 0.025 mg/24hr patch Place 1 patch (0.025 mg total) onto the skin once a week. 05/08/20   Rod Can, CNM  fluticasone (FLONASE) 50 MCG/ACT  nasal spray Place 2 sprays into both nostrils daily. 12/18/18   Mikey College, NP  Ixekizumab (TALTZ) 80 MG/ML SOSY Inject 80 mg into the skin as directed. [facility]    [provider]  levothyroxine (SYNTHROID) 100 MCG tablet Take 1 tablet (100 mcg total) by mouth daily before breakfast. 04/06/20   Glean Hess, MD  lisinopril (ZESTRIL) 20 MG tablet TAKE 1 TABLET(20 MG) BY MOUTH DAILY 06/01/20   Minna Merritts, MD  Multiple Vitamin (MULTI VITAMIN PO) Take 1 tablet by mouth daily.    [provider]  naloxone [facility]) nasal spray 4 mg/0.1 mL To reverse excess sedation from narcotics 12/28/17   Molli Barrows, MD  omeprazole (PRILOSEC) 20 MG capsule Take 1 capsule (20 mg total) by mouth 2 (two) times daily before a meal. 07/08/20   Virgel Manifold, MD  oxyCODONE-acetaminophen (PERCOCET) 10-325 MG tablet Take 1 tablet by mouth every 6 (six) hours as needed for pain. 07/07/20 08/06/20  Gillis Santa, MD  oxyCODONE-acetaminophen (PERCOCET) 10-325 MG tablet Take 1 tablet by mouth every 6 (six) hours as needed for pain. 08/06/20 09/05/20  Gillis Santa, MD  pantoprazole (PROTONIX) 40 MG tablet Take 1 tablet (40 mg total) by mouth 2 (two) times daily. 07/01/20 07/31/20  Virgel Manifold, MD  promethazine (PHENERGAN) 12.5 MG tablet Take 1 tablet (12.5 mg total) by mouth every 6 (six) hours as needed for up to 30 doses for nausea or  vomiting. 07/07/20   Virgel Manifold, MD  Sod Picosulfate-Mag Ox-Cit Acd (CLENPIQ) 10-3.5-12 MG-GM -GM/160ML SOLN Take 1 bottle at 5 PM followed by five 8 oz cups of water and repeat 5 hours before procedure. 07/01/20   Virgel Manifold, MD  spironolactone (ALDACTONE) 25 MG tablet Take 1 tablet (25 mg total) by mouth daily. 06/01/20 08/30/20  Minna Merritts, MD    Allergies as of 07/01/2020 - Review Complete 06/30/2020  Allergen Reaction Noted  . Other  01/11/2019  . Sulfa antibiotics Hives 04/30/2013  . Sulfacetamide sodium   08/06/2015    Family History  Problem Relation Age of Onset  . Hypertension Mother   . Heart disease Father        CABG   . Hyperlipidemia Father   . Hypertension Father   . Diabetes Father     Social History   Socioeconomic History  . Marital status: Married    Spouse name: Not on file  . Number of children: Not on file  . Years of education: Not on file  . Highest education level: Not on file  Occupational History  . Not on file  Tobacco Use  . Smoking status: Former Smoker    Packs/day: 0.25    Years: 15.00    Pack years: 3.75    Types: Cigarettes    Quit date: 2012    Years since quitting: 9.8  . Smokeless tobacco: Former Network engineer  . Vaping Use: Former  . Substances: Flavoring  Substance and Sexual Activity  . Alcohol use: Not Currently    Comment: social  . Drug use: No  . Sexual activity: Yes    Birth control/protection: None  Other Topics Concern  . Not on file  Social History Narrative  . Not on file   Social Determinants of Health   Financial Resource Strain:   . Difficulty of Paying Living Expenses: Not on file  Food Insecurity:   . Worried About Charity fundraiser in the Last Year: Not on file  . Ran Out of Food in the Last Year: Not on file  Transportation Needs:   . Lack of Transportation (Medical): Not on file  . Lack of Transportation (Non-Medical): Not on file  Physical Activity:   . Days of Exercise per Week: Not on file  . Minutes of Exercise per Session: Not on file  Stress:   . Feeling of Stress : Not on file  Social Connections:   . Frequency of Communication with Friends and Family: Not on file  . Frequency of Social Gatherings with Friends and Family: Not on file  . Attends Religious Services: Not on file  . Active Member of Clubs or Organizations: Not on file  . Attends Archivist Meetings: Not on file  . Marital Status: Not on file  Intimate Partner Violence:   . Fear of Current or Ex-Partner: Not on file   . Emotionally Abused: Not on file  . Physically Abused: Not on file  . Sexually Abused: Not on file    Review of Systems: See HPI, otherwise negative ROS  Physical Exam: Ht 5\' 10"  (1.778 m)   Wt 74.8 kg   BMI 23.68 kg/m  General:   Alert,  pleasant and cooperative in NAD Head:  Normocephalic and atraumatic. Neck:  Supple; no masses or thyromegaly. Lungs:  Clear throughout to auscultation, normal respiratory effort.    Heart:  +S1, +S2, Regular rate and rhythm, No edema. Abdomen:  Soft, nontender and nondistended. Normal bowel sounds, without guarding, and without rebound.   Neurologic:  Alert and  oriented x4;  grossly normal neurologically.  Impression/Plan: Onia Perham is here for a colonoscopy to be performed for average risk screening.  Risks, benefits, limitations, and alternatives regarding  colonoscopy have been reviewed with the patient.  Questions have been answered.  All parties agreeable.   Virgel Manifold, MD  07/20/2020, 11:15 AM

## 2020-07-20 NOTE — Anesthesia Preprocedure Evaluation (Signed)
Anesthesia Evaluation  Patient identified by MRN, date of birth, ID band Patient awake    Reviewed: Allergy & Precautions, H&P , NPO status , Patient's Chart, lab work & pertinent test results, reviewed documented beta blocker date and time   Airway Mallampati: III  TM Distance: >3 FB Neck ROM: full    Dental no notable dental hx.    Pulmonary shortness of breath, former smoker,    Pulmonary exam normal breath sounds clear to auscultation       Cardiovascular Exercise Tolerance: Good hypertension, +CHF  Normal cardiovascular exam Rhythm:regular Rate:Normal  Cardiomyopathy with pregnancy, now recovered with EF 60%   Neuro/Psych PSYCHIATRIC DISORDERS Depression negative neurological ROS     GI/Hepatic Neg liver ROS, GERD  ,  Endo/Other  negative endocrine ROSHypothyroidism   Renal/GU negative Renal ROS  negative genitourinary   Musculoskeletal   Abdominal   Peds  Hematology negative hematology ROS (+)   Anesthesia Other Findings   Reproductive/Obstetrics negative OB ROS                             Anesthesia Physical Anesthesia Plan  ASA: II  Anesthesia Plan: General   Post-op Pain Management:    Induction:   PONV Risk Score and Plan:   Airway Management Planned:   Additional Equipment:   Intra-op Plan:   Post-operative Plan:   Informed Consent: I have reviewed the patients History and Physical, chart, labs and discussed the procedure including the risks, benefits and alternatives for the proposed anesthesia with the patient or authorized representative who has indicated his/her understanding and acceptance.     Dental Advisory Given  Plan Discussed with: CRNA  Anesthesia Plan Comments:         Anesthesia Quick Evaluation

## 2020-07-20 NOTE — Anesthesia Postprocedure Evaluation (Signed)
Anesthesia Post Note  Patient: Krista Kennedy  Procedure(s) Performed: COLONOSCOPY WITH PROPOFOL (N/A Rectum)     Patient location during evaluation: PACU Anesthesia Type: General Level of consciousness: awake and alert Pain management: pain level controlled Vital Signs Assessment: post-procedure vital signs reviewed and stable Respiratory status: spontaneous breathing, nonlabored ventilation, respiratory function stable and patient connected to nasal cannula oxygen Cardiovascular status: blood pressure returned to baseline and stable Postop Assessment: no apparent nausea or vomiting Anesthetic complications: no   No complications documented.  Trecia Rogers

## 2020-07-21 ENCOUNTER — Other Ambulatory Visit: Payer: Self-pay

## 2020-07-21 ENCOUNTER — Encounter: Payer: Self-pay | Admitting: Gastroenterology

## 2020-07-21 DIAGNOSIS — R112 Nausea with vomiting, unspecified: Secondary | ICD-10-CM

## 2020-07-29 ENCOUNTER — Ambulatory Visit: Payer: Commercial Managed Care - PPO | Attending: Anesthesiology | Admitting: Anesthesiology

## 2020-07-29 ENCOUNTER — Encounter: Payer: Self-pay | Admitting: Anesthesiology

## 2020-07-29 ENCOUNTER — Other Ambulatory Visit: Payer: Self-pay

## 2020-07-29 DIAGNOSIS — M5441 Lumbago with sciatica, right side: Secondary | ICD-10-CM

## 2020-07-29 DIAGNOSIS — M533 Sacrococcygeal disorders, not elsewhere classified: Secondary | ICD-10-CM

## 2020-07-29 DIAGNOSIS — M25549 Pain in joints of unspecified hand: Secondary | ICD-10-CM | POA: Diagnosis not present

## 2020-07-29 DIAGNOSIS — M25511 Pain in right shoulder: Secondary | ICD-10-CM | POA: Diagnosis not present

## 2020-07-29 DIAGNOSIS — M25512 Pain in left shoulder: Secondary | ICD-10-CM

## 2020-07-29 DIAGNOSIS — L4050 Arthropathic psoriasis, unspecified: Secondary | ICD-10-CM | POA: Diagnosis not present

## 2020-07-29 DIAGNOSIS — G8929 Other chronic pain: Secondary | ICD-10-CM

## 2020-07-29 DIAGNOSIS — M25552 Pain in left hip: Secondary | ICD-10-CM

## 2020-07-29 DIAGNOSIS — G894 Chronic pain syndrome: Secondary | ICD-10-CM

## 2020-07-29 DIAGNOSIS — F1190 Opioid use, unspecified, uncomplicated: Secondary | ICD-10-CM

## 2020-07-29 DIAGNOSIS — M5442 Lumbago with sciatica, left side: Secondary | ICD-10-CM

## 2020-07-29 MED ORDER — MORPHINE SULFATE ER 30 MG PO TBCR: 30.0000 mg | EXTENDED_RELEASE_TABLET | Freq: Two times a day (BID) | ORAL | 0 refills | Status: AC

## 2020-07-29 NOTE — Progress Notes (Signed)
Virtual Visit via Telephone Note  I connected with Krista Kennedy on 07/29/20 at  1:00 PM EST by telephone and verified that I am speaking with the correct person using two identifiers.  Location: Patient: Home Provider: Pain control center   I discussed the limitations, risks, security and privacy concerns of performing an evaluation and management service by telephone and the availability of in person appointments. I also discussed with the patient that there may be a patient responsible charge related to this service. The patient expressed understanding and agreed to proceed.   History of Present Illness: I spoke with Krista Kennedy today via telephone as we were unable to link up with the video portion of virtual conference but she reports that she is having a considerable amount of left and right hip pain in addition to her back pain and systemic pain which is her baseline.  The quality of those pains has been stable in nature and generally responds to the oxycodone at 10 mg strength but the medications have been less effective recently she reports.  No side effects are noted.  The quality the pain has been worse as associated with her arthritic condition.  No other changes are reported at this time.  She states that previously she has been on MS Contin and OxyContin with good success.  She did not tolerate Duragesic.  She is asking if she can modify her existing opioid prescription to see if she can capture better pain control and coverage.  No evidence of any diverting or illicit use is reported and otherwise she gets good relief from the opioids and has failed conservative therapy.   Observations/Objective:  Current Outpatient Medications:  .  Ascorbic Acid (VITAMIN C) 1000 MG tablet, Take 1,000 mg by mouth daily., Disp: , Rfl:  .  carvedilol (COREG) 25 MG tablet, Take 1 tablet (25 mg total) by mouth 2 (two) times daily with a meal., Disp: 180 tablet, Rfl: 0 .  Cholecalciferol (VITAMIN D-1000 MAX  ST) 1000 UNITS tablet, Take by mouth daily. , Disp: , Rfl:  .  clobetasol (TEMOVATE) 0.05 % external solution, Apply 1 application topically as needed. , Disp: , Rfl:  .  estradiol (CLIMARA - DOSED IN MG/24 HR) 0.025 mg/24hr patch, Place 1 patch (0.025 mg total) onto the skin once a week., Disp: 4 patch, Rfl: 0 .  fluticasone (FLONASE) 50 MCG/ACT nasal spray, Place 2 sprays into both nostrils daily., Disp: 16 g, Rfl: 3 .  Ixekizumab (TALTZ) 80 MG/ML SOSY, Inject 80 mg into the skin as directed. [facility], Disp: , Rfl:  .  levothyroxine (SYNTHROID) 100 MCG tablet, Take 1 tablet (100 mcg total) by mouth daily before breakfast., Disp: 90 tablet, Rfl: 0 .  lisinopril (ZESTRIL) 20 MG tablet, TAKE 1 TABLET(20 MG) BY MOUTH DAILY, Disp: 90 tablet, Rfl: 0 .  morphine (MS CONTIN) 30 MG 12 hr tablet, Take 1 tablet (30 mg total) by mouth every 12 (twelve) hours., Disp: 60 tablet, Rfl: 0 .  Multiple Vitamin (MULTI VITAMIN PO), Take 1 tablet by mouth daily., Disp: , Rfl:  .  naloxone (NARCAN) nasal spray 4 mg/0.1 mL, To reverse excess sedation from narcotics, Disp: 2 kit, Rfl: 1 .  omeprazole (PRILOSEC) 40 MG capsule, Take 1 capsule (40 mg total) by mouth daily., Disp: 30 capsule, Rfl: 0 .  oxyCODONE-acetaminophen (PERCOCET) 10-325 MG tablet, Take 1 tablet by mouth every 6 (six) hours as needed for pain., Disp: 120 tablet, Rfl: 0 .  promethazine (PHENERGAN) 12.5  MG tablet, Take 1 tablet (12.5 mg total) by mouth every 6 (six) hours as needed for up to 30 doses for nausea or vomiting., Disp: 30 tablet, Rfl: 0 .  Sod Picosulfate-Mag Ox-Cit Acd (CLENPIQ) 10-3.5-12 MG-GM -GM/160ML SOLN, Take 1 bottle at 5 PM followed by five 8 oz cups of water and repeat 5 hours before procedure., Disp: 320 mL, Rfl: 0 .  spironolactone (ALDACTONE) 25 MG tablet, Take 1 tablet (25 mg total) by mouth daily., Disp: 90 tablet, Rfl: 0  Assessment and Plan: 1. Psoriasis with arthropathy ([place])   2. Chronic pain of both shoulders   3.  Pain in multiple finger joints   4. Chronic bilateral low back pain with bilateral sciatica   5. Chronic pain syndrome   6. Chronic, continuous use of opioids   7. Chronic sacroiliac joint pain   8. Chronic left hip pain   Based on what she is reporting today and previous positive response to MS Contin I am going to dose adjust to 30 mg MS Contin tablets twice daily.  This will be initiated today.  I have reviewed the [facility] practitioner database information and it is appropriate.  No evidence of any diverting or illicit use is reported and she responds favorably to opioids and is gotten good pain relief historically.  As such we will initiate this therapy today.  I cautioned her against concomitant use of any short acting opioids in addition to this change at present.  She is to stop her existing oxycodone use and we will schedule her for a 1 month return.  I encouraged her to continue follow-up with her rheumatologist and medical physicians for baseline medical care with schedule return in 1 month.  Follow Up Instructions:    I discussed the assessment and treatment plan with the patient. The patient was provided an opportunity to ask questions and all were answered. The patient agreed with the plan and demonstrated an understanding of the instructions.   The patient was advised to call back or seek an in-person evaluation if the symptoms worsen or if the condition fails to improve as anticipated.  I provided 30 minutes of non-face-to-face time during this encounter.   Molli Barrows, MD

## 2020-08-04 ENCOUNTER — Ambulatory Visit: Payer: Self-pay

## 2020-08-20 ENCOUNTER — Other Ambulatory Visit: Payer: Self-pay

## 2020-08-20 ENCOUNTER — Encounter: Payer: Self-pay | Admitting: Anesthesiology

## 2020-08-20 ENCOUNTER — Ambulatory Visit: Payer: Commercial Managed Care - PPO | Attending: Anesthesiology | Admitting: Anesthesiology

## 2020-08-20 DIAGNOSIS — F1190 Opioid use, unspecified, uncomplicated: Secondary | ICD-10-CM

## 2020-08-20 DIAGNOSIS — M25552 Pain in left hip: Secondary | ICD-10-CM

## 2020-08-20 DIAGNOSIS — M25532 Pain in left wrist: Secondary | ICD-10-CM

## 2020-08-20 DIAGNOSIS — M5442 Lumbago with sciatica, left side: Secondary | ICD-10-CM | POA: Diagnosis not present

## 2020-08-20 DIAGNOSIS — M25511 Pain in right shoulder: Secondary | ICD-10-CM

## 2020-08-20 DIAGNOSIS — M25549 Pain in joints of unspecified hand: Secondary | ICD-10-CM | POA: Diagnosis not present

## 2020-08-20 DIAGNOSIS — G894 Chronic pain syndrome: Secondary | ICD-10-CM

## 2020-08-20 DIAGNOSIS — L4050 Arthropathic psoriasis, unspecified: Secondary | ICD-10-CM | POA: Diagnosis not present

## 2020-08-20 DIAGNOSIS — M25512 Pain in left shoulder: Secondary | ICD-10-CM

## 2020-08-20 DIAGNOSIS — M5441 Lumbago with sciatica, right side: Secondary | ICD-10-CM

## 2020-08-20 DIAGNOSIS — G8929 Other chronic pain: Secondary | ICD-10-CM

## 2020-08-20 MED ORDER — MORPHINE SULFATE ER 30 MG PO TBCR: 30.0000 mg | EXTENDED_RELEASE_TABLET | Freq: Two times a day (BID) | ORAL | 0 refills | Status: DC

## 2020-08-20 MED ORDER — MORPHINE SULFATE ER 30 MG PO TBCR: 30.0000 mg | EXTENDED_RELEASE_TABLET | Freq: Two times a day (BID) | ORAL | 0 refills | Status: AC

## 2020-08-20 MED ORDER — OXYCODONE-ACETAMINOPHEN 10-325 MG PO TABS: 1.0000 | ORAL_TABLET | Freq: Three times a day (TID) | ORAL | 0 refills | Status: AC | PRN

## 2020-08-20 MED ORDER — OXYCODONE-ACETAMINOPHEN 10-325 MG PO TABS: 1.0000 | ORAL_TABLET | Freq: Two times a day (BID) | ORAL | 0 refills | Status: DC | PRN

## 2020-08-26 ENCOUNTER — Telehealth: Payer: Commercial Managed Care - PPO | Admitting: Gastroenterology

## 2020-09-01 NOTE — Progress Notes (Signed)
Virtual Visit via Telephone Note  I connected with Krista Kennedy on 09/01/20 at 10:45 AM EST by telephone and verified that I am speaking with the correct person using two identifiers.  Location: Patient: Home Provider: Pain control center   I discussed the limitations, risks, security and privacy concerns of performing an evaluation and management service by telephone and the availability of in person appointments. I also discussed with the patient that there may be a patient responsible charge related to this service. The patient expressed understanding and agreed to proceed.   History of Present Illness: I was able to reach Krista Kennedy for her virtual conference. We were unable to link for the video portion of the virtual conference but she reports that she is doing reasonably well but continues to have frequent breakthrough pain despite opioid management. Most recent regimen is working better for her. She is presently taking the extended release morphine which is working well for her but does still breakthrough and requires the Percocet for assistance. The quality characteristic and distribution of her pain are otherwise unchanged. No change in strength or function is noted bowel bladder functions been stable. She denies any side effects with her medications.   Observations/Objective:  Current Outpatient Medications:  .  Ascorbic Acid (VITAMIN C) 1000 MG tablet, Take 1,000 mg by mouth daily., Disp: , Rfl:  .  carvedilol (COREG) 25 MG tablet, Take 1 tablet (25 mg total) by mouth 2 (two) times daily with a meal., Disp: 180 tablet, Rfl: 0 .  Cholecalciferol (VITAMIN D-1000 MAX ST) 1000 UNITS tablet, Take by mouth daily. , Disp: , Rfl:  .  clobetasol (TEMOVATE) 0.05 % external solution, Apply 1 application topically as needed. , Disp: , Rfl:  .  estradiol (CLIMARA - DOSED IN MG/24 HR) 0.025 mg/24hr patch, Place 1 patch (0.025 mg total) onto the skin once a week., Disp: 4 patch, Rfl: 0 .  fluticasone  (FLONASE) 50 MCG/ACT nasal spray, Place 2 sprays into both nostrils daily., Disp: 16 g, Rfl: 3 .  Ixekizumab (TALTZ) 80 MG/ML SOSY, Inject 80 mg into the skin as directed. [facility], Disp: , Rfl:  .  levothyroxine (SYNTHROID) 100 MCG tablet, Take 1 tablet (100 mcg total) by mouth daily before breakfast., Disp: 90 tablet, Rfl: 0 .  lisinopril (ZESTRIL) 20 MG tablet, TAKE 1 TABLET(20 MG) BY MOUTH DAILY, Disp: 90 tablet, Rfl: 0 .  morphine (MS CONTIN) 30 MG 12 hr tablet, Take 1 tablet (30 mg total) by mouth every 12 (twelve) hours., Disp: 60 tablet, Rfl: 0 .  [START ON 09/26/2020] morphine (MS CONTIN) 30 MG 12 hr tablet, Take 1 tablet (30 mg total) by mouth every 12 (twelve) hours., Disp: 60 tablet, Rfl: 0 .  Multiple Vitamin (MULTI VITAMIN PO), Take 1 tablet by mouth daily., Disp: , Rfl:  .  naloxone (NARCAN) nasal spray 4 mg/0.1 mL, To reverse excess sedation from narcotics, Disp: 2 kit, Rfl: 1 .  omeprazole (PRILOSEC) 40 MG capsule, Take 1 capsule (40 mg total) by mouth daily., Disp: 30 capsule, Rfl: 0 .  oxyCODONE-acetaminophen (PERCOCET) 10-325 MG tablet, Take 1 tablet by mouth every 8 (eight) hours as needed for pain., Disp: 90 tablet, Rfl: 0 .  [START ON 09/26/2020] oxyCODONE-acetaminophen (PERCOCET) 10-325 MG tablet, Take 1 tablet by mouth 2 (two) times daily as needed for pain., Disp: 60 tablet, Rfl: 0 .  promethazine (PHENERGAN) 12.5 MG tablet, Take 1 tablet (12.5 mg total) by mouth every 6 (six) hours as needed for  up to 30 doses for nausea or vomiting., Disp: 30 tablet, Rfl: 0 .  Sod Picosulfate-Mag Ox-Cit Acd (CLENPIQ) 10-3.5-12 MG-GM -GM/160ML SOLN, Take 1 bottle at 5 PM followed by five 8 oz cups of water and repeat 5 hours before procedure., Disp: 320 mL, Rfl: 0 .  spironolactone (ALDACTONE) 25 MG tablet, Take 1 tablet (25 mg total) by mouth daily., Disp: 90 tablet, Rfl: 0  Assessment and Plan: 1. Psoriasis with arthropathy ([place])   2. Chronic pain of both shoulders   3. Chronic  bilateral low back pain with bilateral sciatica   4. Pain in multiple finger joints   5. Chronic pain syndrome   6. Chronic, continuous use of opioids   7. Chronic left hip pain   8. Left wrist pain   9. Psoriatic arthritis ([place])   Based on our discussion today and upon review of the [facility] practitioner database information 1 refill her medications for the next 2 months. To be dated for January and February. I will keep her on her long acting MS Contin for baseline pain management and I am going to have her to continue with the Percocet for as needed breakthrough pain. She feels that she is gone through a recent increase in intensity and we will subsequently allow her Percocet 3 times a day for this. No other changes will be made in her regimen at this time. She is scheduled for 29-monthreturn to clinic  Follow Up Instructions:    I discussed the assessment and treatment plan with the patient. The patient was provided an opportunity to ask questions and all were answered. The patient agreed with the plan and demonstrated an understanding of the instructions.   The patient was advised to call back or seek an in-person evaluation if the symptoms worsen or if the condition fails to improve as anticipated.  I provided 30 minutes of non-face-to-face time during this encounter.   JMolli Barrows MD

## 2020-09-02 ENCOUNTER — Other Ambulatory Visit: Payer: Self-pay | Admitting: Cardiovascular Disease

## 2020-09-02 DIAGNOSIS — I5022 Chronic systolic (congestive) heart failure: Secondary | ICD-10-CM

## 2020-09-02 NOTE — Telephone Encounter (Signed)
*  STAT* If patient is at the pharmacy, call can be transferred to refill team.   1. Which medications need to be refilled? (please list name of each medication and dose if known) carvedilol (COREG) 25 MG  2. Which pharmacy/location (including street and city if local pharmacy) is medication to be sent to? [facility] in [place]  3. Do they need a 30 day or 90 day supply? 90 day

## 2020-09-04 ENCOUNTER — Emergency Department: Payer: Commercial Managed Care - PPO

## 2020-09-04 ENCOUNTER — Other Ambulatory Visit: Payer: Self-pay

## 2020-09-04 ENCOUNTER — Emergency Department
Admission: EM | Admit: 2020-09-04 | Discharge: 2020-09-04 | Disposition: A | Discharge status: Home / Self Care | Payer: Commercial Managed Care - PPO | Attending: Student in an Organized Health Care Education/Training Program | Admitting: Student in an Organized Health Care Education/Training Program

## 2020-09-04 DIAGNOSIS — R0602 Shortness of breath: Secondary | ICD-10-CM

## 2020-09-04 DIAGNOSIS — Z87891 Personal history of nicotine dependence: Secondary | ICD-10-CM | POA: Insufficient documentation

## 2020-09-04 DIAGNOSIS — I509 Heart failure, unspecified: Secondary | ICD-10-CM | POA: Diagnosis not present

## 2020-09-04 DIAGNOSIS — U071 COVID-19: Secondary | ICD-10-CM

## 2020-09-04 DIAGNOSIS — Z79899 Other long term (current) drug therapy: Secondary | ICD-10-CM | POA: Insufficient documentation

## 2020-09-04 DIAGNOSIS — I110 Hypertensive heart disease with heart failure: Secondary | ICD-10-CM | POA: Diagnosis not present

## 2020-09-04 LAB — CBC
HCT: 40.6 % (ref 36.0–46.0)
Hemoglobin: 13.4 g/dL (ref 12.0–15.0)
MCH: 29.6 pg (ref 26.0–34.0)
MCHC: 33 g/dL (ref 30.0–36.0)
MCV: 89.8 fL (ref 80.0–100.0)
Platelets: 270 10*3/uL (ref 150–400)
RBC: 4.52 MIL/uL (ref 3.87–5.11)
RDW: 11.9 % (ref 11.5–15.5)
WBC: 7.2 10*3/uL (ref 4.0–10.5)
nRBC: 0 % (ref 0.0–0.2)

## 2020-09-04 LAB — BASIC METABOLIC PANEL
Anion gap: 11 (ref 5–15)
BUN: 24 mg/dL — ABNORMAL HIGH (ref 6–20)
CO2: 28 mmol/L (ref 22–32)
Calcium: 9.6 mg/dL (ref 8.9–10.3)
Chloride: 97 mmol/L — ABNORMAL LOW (ref 98–111)
Creatinine, Ser: 1.29 mg/dL — ABNORMAL HIGH (ref 0.44–1.00)
GFR, Estimated: 49 mL/min — ABNORMAL LOW (ref >60)
Glucose, Bld: 117 mg/dL — ABNORMAL HIGH (ref 70–99)
Potassium: 3.9 mmol/L (ref 3.5–5.1)
Sodium: 136 mmol/L (ref 135–145)

## 2020-09-04 LAB — TROPONIN I (HIGH SENSITIVITY): Troponin I (High Sensitivity): 4 ng/L (ref <18)

## 2020-09-04 LAB — FIBRIN DERIVATIVES D-DIMER (ARMC ONLY): Fibrin derivatives D-dimer (ARMC): 306.29 ng/mL (FEU) (ref 0.00–499.00)

## 2020-09-04 MED ORDER — ALBUTEROL SULFATE HFA 108 (90 BASE) MCG/ACT IN AERS: 2.0000 | INHALATION_SPRAY | Freq: Four times a day (QID) | RESPIRATORY_TRACT | 0 refills | Status: AC | PRN

## 2020-09-04 NOTE — ED Triage Notes (Signed)
Pt presents via POV c/o SOB. Reports had covid x2 weeks ago and how having intermittent SOB. Reports feels like she is having an asthma attack. Pt speaking in full sentences. Pt in no acute distress. Ambulatory to triage.

## 2020-09-04 NOTE — ED Provider Notes (Signed)
[facility] Emergency Department Provider Note  ____________________________________________  Time seen: Approximately 2:55 PM  I have reviewed the triage vital signs and the nursing notes.   HISTORY  Chief Complaint Shortness of Breath    HPI Krista Kennedy is a 54 y.o. female that presents to the emergency department for evaluation of 2 episodes of shortness of breath over the course of the last 3 days.  Patient states that she had an episode of shortness of breath this morning that lasted a couple of minutes.  She states that it feels like she is having an asthma attack, although she has no history of asthma.  She had another episode 3 days ago that also lasted a couple of days.  Patient was diagnosed with Covid on December 12.  She took another Covid test on December 18 and tested negative.  Her Covid symptoms had seemed to have resolved.  No fevers, chest pain, vomiting.  Past Medical History:  Diagnosis Date  . Arthritis    Psoriatic  . Cardiomyopathy ([place])   . CHF (congestive heart failure) ([place])   . Hyperlipidemia   . Hypertension   . Psoriasis   . Thyroid disease   . Transient weakness of left leg 08/13/2019  . Wears contact lenses     Patient Active Problem List   Diagnosis Date Noted  . Encounter for screening colonoscopy   . Gastric erythema   . Nausea and vomiting   . Transient weakness of left leg 08/13/2019  . Recurrent major depressive disorder, in partial remission ([place]) 06/26/2019  . Chronic, continuous use of opioids 02/19/2019  . History of cardiomyopathy 08/21/2018  . Shortness of breath 08/21/2018  . Palpitations 08/21/2018  . Chronic shoulder pain 06/14/2017  . Encounter for long-term (current) use of other medications 05/24/2017  . Left wrist pain 04/11/2017  . Chronic sacroiliac joint pain 04/11/2017  . Hyperlipidemia 03/15/2017  . Elevated hemoglobin A1c 03/15/2017  . Therapeutic opioid induced constipation 02/02/2017  .  Chronic pain syndrome 04/07/2016  . Chronic hip pain 04/07/2016  . Pain in multiple finger joints 04/07/2016  . Allergic rhinitis due to pollen 09/08/2015  . Other long term (current) drug therapy 08/13/2015  . Depression 08/06/2015  . Opioid dependence, daily use ([place]) 08/06/2015  . High risk medications (not anticoagulants) long-term use 02/11/2015  . Dysmenorrhea 10/29/2013  . Congestive heart failure ([place]) 10/29/2013  . Menorrhagia 10/15/2013  . Fibroid 10/10/2013  . Hyperglycemia 08/22/2013  . Hypertension 04/30/2013  . Psoriasis 04/30/2013  . Metrorrhagia 12/07/2011  . Chronic low back pain 11/02/2011  . GERD (gastroesophageal reflux disease) 07/12/2010  . Hypothyroidism 07/12/2010  . Psoriasis with arthropathy ([place]) 07/12/2010  . Avitaminosis D 07/12/2010  . Abnormal mammogram 07/12/2010  . Breast screening 07/12/2010  . Encounter for routine gynecological examination 07/12/2010  . Insomnia 07/12/2010  . Vitamin D deficiency 07/12/2010    Past Surgical History:  Procedure Laterality Date  . COLONOSCOPY WITH PROPOFOL N/A 06/30/2020   Procedure: COLONOSCOPY WITH PROPOFOL;  Surgeon: Virgel Manifold, MD;  Location: [place];  Service: Endoscopy;  Laterality: N/A;  . COLONOSCOPY WITH PROPOFOL N/A 07/20/2020   Procedure: COLONOSCOPY WITH PROPOFOL;  Surgeon: Virgel Manifold, MD;  Location: [place];  Service: Endoscopy;  Laterality: N/A;  poor prep  . ENDOMETRIAL ABLATION  2015  . ESOPHAGOGASTRODUODENOSCOPY (EGD) WITH PROPOFOL N/A 06/30/2020   Procedure: ESOPHAGOGASTRODUODENOSCOPY (EGD) WITH BIOPSY;  Surgeon: Virgel Manifold, MD;  Location: [place];  Service:  Endoscopy;  Laterality: N/A;  . THYROIDECTOMY    . TONSILLECTOMY    . TUBAL LIGATION      Prior to Admission medications   Medication Sig Start Date End Date Taking? Authorizing Provider  albuterol (VENTOLIN HFA) 108 (90 Base) MCG/ACT inhaler Inhale 2 puffs into the  lungs every 6 (six) hours as needed for wheezing or shortness of breath. 09/04/20  Yes Enid Derry, PA-C  Ascorbic Acid (VITAMIN C) 1000 MG tablet Take 1,000 mg by mouth daily.    [provider]  carvedilol (COREG) 25 MG tablet TAKE 1 TABLET(25 MG) BY MOUTH TWICE DAILY WITH A MEAL 09/02/20   Gollan, Tollie Pizza, MD  Cholecalciferol (VITAMIN D-1000 MAX ST) 1000 UNITS tablet Take by mouth daily.     [provider]  clobetasol (TEMOVATE) 0.05 % external solution Apply 1 application topically as needed.  03/22/17   [provider]  estradiol (CLIMARA - DOSED IN MG/24 HR) 0.025 mg/24hr patch Place 1 patch (0.025 mg total) onto the skin once a week. 05/08/20   Tresea Mall, CNM  fluticasone (FLONASE) 50 MCG/ACT nasal spray Place 2 sprays into both nostrils daily. 12/18/18   Galen Manila, NP  Ixekizumab (TALTZ) 80 MG/ML SOSY Inject 80 mg into the skin as directed. [facility]    [provider]  levothyroxine (SYNTHROID) 100 MCG tablet Take 1 tablet (100 mcg total) by mouth daily before breakfast. 04/06/20   Reubin Milan, MD  lisinopril (ZESTRIL) 20 MG tablet TAKE 1 TABLET(20 MG) BY MOUTH DAILY 06/01/20   Antonieta Iba, MD  morphine (MS CONTIN) 30 MG 12 hr tablet Take 1 tablet (30 mg total) by mouth every 12 (twelve) hours. 08/27/20 09/26/20  Yevette Edwards, MD  morphine (MS CONTIN) 30 MG 12 hr tablet Take 1 tablet (30 mg total) by mouth every 12 (twelve) hours. 09/26/20 10/26/20  Yevette Edwards, MD  Multiple Vitamin (MULTI VITAMIN PO) Take 1 tablet by mouth daily.    [provider]  naloxone [facility]) nasal spray 4 mg/0.1 mL To reverse excess sedation from narcotics 12/28/17   Yevette Edwards, MD  omeprazole (PRILOSEC) 40 MG capsule Take 1 capsule (40 mg total) by mouth daily. 07/20/20   Pasty Spillers, MD  oxyCODONE-acetaminophen (PERCOCET) 10-325 MG tablet Take 1 tablet by mouth every 8 (eight) hours as needed for pain. 08/20/20 09/19/20   Yevette Edwards, MD  oxyCODONE-acetaminophen (PERCOCET) 10-325 MG tablet Take 1 tablet by mouth 2 (two) times daily as needed for pain. 09/26/20 10/26/20  Yevette Edwards, MD  promethazine (PHENERGAN) 12.5 MG tablet Take 1 tablet (12.5 mg total) by mouth every 6 (six) hours as needed for up to 30 doses for nausea or vomiting. 07/20/20   Pasty Spillers, MD  Sod Picosulfate-Mag Ox-Cit Acd (CLENPIQ) 10-3.5-12 MG-GM -GM/160ML SOLN Take 1 bottle at 5 PM followed by five 8 oz cups of water and repeat 5 hours before procedure. 07/01/20   Pasty Spillers, MD  spironolactone (ALDACTONE) 25 MG tablet Take 1 tablet (25 mg total) by mouth daily. 06/01/20 08/30/20  Antonieta Iba, MD  pantoprazole (PROTONIX) 40 MG tablet Take 1 tablet (40 mg total) by mouth 2 (two) times daily. 07/01/20 07/20/20  Pasty Spillers, MD    Allergies Other, Sulfa antibiotics, and Sulfacetamide sodium  Family History  Problem Relation Age of Onset  . Hypertension Mother   . Heart disease Father        CABG   .  Hyperlipidemia Father   . Hypertension Father   . Diabetes Father     Social History Social History   Tobacco Use  . Smoking status: Former Smoker    Packs/day: 0.25    Years: 15.00    Pack years: 3.75    Types: Cigarettes    Quit date: 2012    Years since quitting: 10.0  . Smokeless tobacco: Former Network engineer  . Vaping Use: Former  . Substances: Flavoring  Substance Use Topics  . Alcohol use: Not Currently    Comment: social  . Drug use: No     Review of Systems  Constitutional: No fever/chills Eyes: No visual changes. No discharge. ENT: Negative for congestion and rhinorrhea. Cardiovascular: No chest pain. Respiratory: Negatiave for cough. Positive for 2 SOB episodes lasting a couple of minutes. Gastrointestinal: No abdominal pain.  No nausea, no vomiting.  No diarrhea.  No constipation. Musculoskeletal: Negative for musculoskeletal pain. Skin: Negative for rash, abrasions,  lacerations, ecchymosis. Neurological: Negative for headaches.   ____________________________________________   PHYSICAL EXAM:  VITAL SIGNS: ED Triage Vitals  Enc Vitals Group     BP 09/04/20 1237 105/78     Pulse Rate 09/04/20 1237 85     Resp 09/04/20 1237 14     Temp 09/04/20 1237 97.7 F (36.5 C)     Temp Source 09/04/20 1237 Oral     SpO2 09/04/20 1237 100 %     Weight --      Height --      Head Circumference --      Peak Flow --      Pain Score 09/04/20 1238 4     Pain Loc --      Pain Edu? --      Excl. in [place]? --      Constitutional: Alert and oriented. Well appearing and in no acute distress. Eyes: Conjunctivae are normal. PERRL. EOMI. No discharge. Head: Atraumatic. ENT: No frontal and maxillary sinus tenderness.      Ears: Tympanic membranes pearly gray with good landmarks. No discharge.      Nose: No congestion/rhinnorhea.      Mouth/Throat: Mucous membranes are moist. Oropharynx non-erythematous. Tonsils not enlarged. No exudates. Uvula midline. Neck: No stridor.   Hematological/Lymphatic/Immunilogical: No cervical lymphadenopathy. Cardiovascular: Normal rate, regular rhythm.  Good peripheral circulation. Respiratory: Normal respiratory effort without tachypnea or retractions. Lungs CTAB. Good air entry to the bases with no decreased or absent breath sounds. Gastrointestinal: Bowel sounds 4 quadrants. Soft and nontender to palpation. No guarding or rigidity. No palpable masses. No distention. Musculoskeletal: Full range of motion to all extremities. No gross deformities appreciated. Neurologic:  Normal speech and language. No gross focal neurologic deficits are appreciated.  Skin:  Skin is warm, dry and intact. No rash noted. Psychiatric: Mood and affect are normal. Speech and behavior are normal. Patient exhibits appropriate insight and judgement.   ____________________________________________   LABS (all labs ordered are listed, but only abnormal  results are displayed)  Labs Reviewed  BASIC METABOLIC PANEL - Abnormal; Notable for the following components:      Result Value   Chloride 97 (*)    Glucose, Bld 117 (*)    BUN 24 (*)    Creatinine, Ser 1.29 (*)    GFR, Estimated 49 (*)    All other components within normal limits  CBC  FIBRIN DERIVATIVES D-DIMER ([facility] ONLY)  TROPONIN I (HIGH SENSITIVITY)  TROPONIN I (HIGH SENSITIVITY)   ____________________________________________  EKG   ____________________________________________  RADIOLOGY Robinette Haines, personally viewed and evaluated these images (plain radiographs) as part of my medical decision making, as well as reviewing the written report by the radiologist.  DG Chest 2 View  Result Date: 09/04/2020 CLINICAL DATA:  Shortness of breath. EXAM: CHEST - 2 VIEW COMPARISON:  March 06, 2020. FINDINGS: The heart size and mediastinal contours are within normal limits. Both lungs are clear. No pneumothorax or pleural effusion is noted. The visualized skeletal structures are unremarkable. IMPRESSION: No active cardiopulmonary disease. Electronically Signed   By: Marijo Conception M.D.   On: 09/04/2020 13:08    ____________________________________________    PROCEDURES  Procedure(s) performed:    Procedures    Medications - No data to display   ____________________________________________   INITIAL IMPRESSION / ASSESSMENT AND PLAN / ED COURSE  Pertinent labs & imaging results that were available during my care of the patient were reviewed by me and considered in my medical decision making (see chart for details).  Review of the [place] CSRS was performed in accordance of the [place] prior to dispensing any controlled drugs.   Patient presented to the emergency department for evaluation of two short episodes of shortness of breath after being diagnosed with Covid earlier this month. Vital signs and exam are reassuring.  Sinus rhythm on EKG.  Chest x-ray negative for  acute cardiopulmonary processes.  Troponin and D-dimer are within normal limits.  Patient will be given a prescription for an albuterol inhaler to try in case she has another episode.  Patient feels comfortable going home. Patient will be discharged home with prescriptions for albuterol inhaler. Patient is to follow up with primary care as needed or otherwise directed. Patient is given ED precautions to return to the ED for any worsening or new symptoms.   Sherley Borkowski was evaluated in Emergency Department on 09/04/2020 for the symptoms described in the history of present illness. She was evaluated in the context of the global COVID-19 pandemic, which necessitated consideration that the patient might be at risk for infection with the SARS-CoV-2 virus that causes COVID-19. Institutional protocols and algorithms that pertain to the evaluation of patients at risk for COVID-19 are in a state of rapid change based on information released by regulatory bodies including the CDC and federal and state organizations. These policies and algorithms were followed during the patient's care in the ED.  ____________________________________________  FINAL CLINICAL IMPRESSION(S) / ED DIAGNOSES  Final diagnoses:  COVID  SOB (shortness of breath)      NEW MEDICATIONS STARTED DURING THIS VISIT:  ED Discharge Orders         Ordered    albuterol (VENTOLIN HFA) 108 (90 Base) MCG/ACT inhaler  Every 6 hours PRN        09/04/20 1630              This chart was dictated using voice recognition software/Dragon. Despite best efforts to proofread, errors can occur which can change the meaning. Any change was purely unintentional.    Laban Emperor, PA-C 09/04/20 1655    Merlyn Lot, MD 09/06/20 1446

## 2020-09-08 ENCOUNTER — Other Ambulatory Visit: Payer: Self-pay | Admitting: Cardiovascular Disease

## 2020-09-08 DIAGNOSIS — I10 Essential (primary) hypertension: Secondary | ICD-10-CM

## 2020-09-11 ENCOUNTER — Telehealth: Payer: Self-pay | Admitting: *Deleted

## 2020-09-11 ENCOUNTER — Encounter: Payer: Self-pay | Admitting: *Deleted

## 2020-09-11 NOTE — Telephone Encounter (Signed)
Patient requesting a letter to be faxed to her work, stating that we prescribe Morphine and when it was prescribed. Fax number is [phone]. I told her I can do this today.

## 2020-09-18 ENCOUNTER — Telehealth: Payer: Self-pay | Admitting: *Deleted

## 2020-09-18 NOTE — Telephone Encounter (Signed)
Spoke with Krista Kennedy as she had called back about information re; Morphine.  She is applying for a job and they are requesting verification that she has been on Morphine.  Krista Kennedy sent a letter documenting how long she has been taking it and the last prescription that was given to the fax that the patient gave [place].  She can not be specific as to what more that they need.  I told her we would be more than happy to accommodate her if she could be specific.  Hours for Friday given to her as well as what time we should be here on Monday d/t inclement weather forecast.

## 2020-09-28 NOTE — Progress Notes (Deleted)
Cardiology Office Note:    Date:  09/28/2020   ID:  Krista Kennedy, DOB 02-May-1966, MRN [id number]  PCP:  Patient, No Pcp Per  [place] Cardiologist:  Ida Rogue, MD  [place] Electrophysiologist:  None   Referring MD: No ref. provider found   Chief Complaint: 1 year f/u  History of Present Illness:    Krista Kennedy is a 55 y.o. female with a hx of dilated cardiomyopathy/peripartum in 2002 in with EF down to 25%, EF% in 2014, insomnia, former smoker, GERD, HTN, hypothyroidism, chronic pain syndrome who presents for 1 year follow-up.   She has a history of peripartum cardiomyopathy in 2002 with associated lower extremity edema swelling and sob. It took some time for her EF to improve. Most recent echo showed EF 55% no WMA, G1DD.  BP was elevated and spiro was added. She was last seen 11/18/19 and was stable from a cardiac perspective. BP was well controlled.   Today,      Past Medical History:  Diagnosis Date  . Arthritis    Psoriatic  . Cardiomyopathy ([place])   . CHF (congestive heart failure) ([place])   . Hyperlipidemia   . Hypertension   . Psoriasis   . Thyroid disease   . Transient weakness of left leg 08/13/2019  . Wears contact lenses     Past Surgical History:  Procedure Laterality Date  . COLONOSCOPY WITH PROPOFOL N/A 06/30/2020   Procedure: COLONOSCOPY WITH PROPOFOL;  Surgeon: Virgel Manifold, MD;  Location: [place];  Service: Endoscopy;  Laterality: N/A;  . COLONOSCOPY WITH PROPOFOL N/A 07/20/2020   Procedure: COLONOSCOPY WITH PROPOFOL;  Surgeon: Virgel Manifold, MD;  Location: [place];  Service: Endoscopy;  Laterality: N/A;  poor prep  . ENDOMETRIAL ABLATION  2015  . ESOPHAGOGASTRODUODENOSCOPY (EGD) WITH PROPOFOL N/A 06/30/2020   Procedure: ESOPHAGOGASTRODUODENOSCOPY (EGD) WITH BIOPSY;  Surgeon: Virgel Manifold, MD;  Location: [place];  Service: Endoscopy;  Laterality: N/A;  . THYROIDECTOMY    .  TONSILLECTOMY    . TUBAL LIGATION      Current Medications: No outpatient medications have been marked as taking for the 09/30/20 encounter (Appointment) with Rise Mu, PA-C.     Allergies:   Other, Sulfa antibiotics, and Sulfacetamide sodium   Social History   Socioeconomic History  . Marital status: Married    Spouse name: Not on file  . Number of children: Not on file  . Years of education: Not on file  . Highest education level: Not on file  Occupational History  . Not on file  Tobacco Use  . Smoking status: Former Smoker    Packs/day: 0.25    Years: 15.00    Pack years: 3.75    Types: Cigarettes    Quit date: 2012    Years since quitting: 10.0  . Smokeless tobacco: Former Network engineer  . Vaping Use: Former  . Substances: Flavoring  Substance and Sexual Activity  . Alcohol use: Not Currently    Comment: social  . Drug use: No  . Sexual activity: Yes    Birth control/protection: None  Other Topics Concern  . Not on file  Social History Narrative  . Not on file   Social Determinants of Health   Financial Resource Strain: Not on file  Food Insecurity: Not on file  Transportation Needs: Not on file  Physical Activity: Not on file  Stress: Not on file  Social Connections: Not on file  Family History: The patient's family history includes Diabetes in her father; Heart disease in her father; Hyperlipidemia in her father; Hypertension in her father and mother.  ROS:   Please see the history of present illness.     All other systems reviewed and are negative.  EKGs/Labs/Other Studies Reviewed:    The following studies were reviewed today:  Echo 11/2019 1. Left ventricular ejection fraction, by estimation, is 40 to 45%. The  left ventricle has mildly decreased function. The left ventricle  demonstrates global hypokinesis. There is mild left ventricular  hypertrophy. Left ventricular diastolic parameters  are consistent with Grade I diastolic  dysfunction (impaired relaxation).  2. Right ventricular systolic function is normal. The right ventricular  size is normal. There is normal pulmonary artery systolic pressure.   Comparison(s): EF 50-55%.    EKG:  EKG is *** ordered today.  The ekg ordered today demonstrates ***  Recent Labs: 05/06/2020: ALT 16; TSH 0.130 09/04/2020: BUN 24; Creatinine, Ser 1.29; Hemoglobin 13.4; Platelets 270; Potassium 3.9; Sodium 136  Recent Lipid Panel    Component Value Date/Time   CHOL 200 (H) 05/06/2020 1130   TRIG 122 05/06/2020 1130   HDL 60 05/06/2020 1130   CHOLHDL 4.7 07/20/2018 0959   VLDL 20 04/11/2017 1103   LDLCALC 118 (H) 05/06/2020 1130   LDLCALC 129 (H) 07/20/2018 0959     Risk Assessment/Calculations:       Physical Exam:    VS:  There were no vitals taken for this visit.    Wt Readings from Last 3 Encounters:  07/20/20 163 lb (73.9 kg)  07/02/20 165 lb (74.8 kg)  06/30/20 165 lb (74.8 kg)     GEN: *** Well nourished, well developed in no acute distress HEENT: Normal NECK: No JVD; No carotid bruits LYMPHATICS: No lymphadenopathy CARDIAC: ***RRR, no murmurs, rubs, gallops RESPIRATORY:  Clear to auscultation without rales, wheezing or rhonchi  ABDOMEN: Soft, non-tender, non-distended MUSCULOSKELETAL:  No edema; No deformity  SKIN: Warm and dry NEUROLOGIC:  Alert and oriented x 3 PSYCHIATRIC:  Normal affect   ASSESSMENT:    No diagnosis found. PLAN:    In order of problems listed above:  Hx of peripartum cardiomyopathy with improved EF/dyspnea Most recent echo 11/2019 showed LVEF 40-45%. Continue coreg, lisinopril, spiro  HTN Lisinopril 20mg  daily, coreg 25mg  daily, spiro   Hypothyroidism  Disposition: Follow up {follow up:15908} with ***   Shared Decision Making/Informed Consent   {Are you ordering a CV Procedure (e.g. stress test, cath, DCCV, TEE, etc)?   Press F2        :[id number]}    Signed, Haroon Shatto Ninfa Meeker, PA-C  09/28/2020 3:01 PM    [facility]

## 2020-09-30 ENCOUNTER — Ambulatory Visit: Payer: Commercial Managed Care - PPO | Admitting: Physician Assistant

## 2020-09-30 DIAGNOSIS — Z8679 Personal history of other diseases of the circulatory system: Secondary | ICD-10-CM

## 2020-09-30 DIAGNOSIS — I10 Essential (primary) hypertension: Secondary | ICD-10-CM

## 2020-09-30 DIAGNOSIS — E039 Hypothyroidism, unspecified: Secondary | ICD-10-CM

## 2020-10-01 ENCOUNTER — Encounter: Payer: Self-pay | Admitting: Physician Assistant

## 2020-10-06 ENCOUNTER — Telehealth: Payer: Self-pay

## 2020-10-06 ENCOUNTER — Other Ambulatory Visit: Payer: Self-pay

## 2020-10-06 DIAGNOSIS — E079 Disorder of thyroid, unspecified: Secondary | ICD-10-CM

## 2020-10-06 MED ORDER — LEVOTHYROXINE SODIUM 100 MCG PO TABS: 100.0000 ug | ORAL_TABLET | Freq: Every day | ORAL | 0 refills | Status: AC

## 2020-10-06 NOTE — Telephone Encounter (Signed)
Patient states she needs a refill on Synthroid, she is in transition of PCP. Please call and advise if this can be refilled by our office.

## 2020-10-06 NOTE — Telephone Encounter (Signed)
Patient called in stating that she was having severe SOB when taking morphine bid.  States that when she stopped taking this the s/s improved.  She is wanting to increase her percocet from 2 times per day to 4 times per day.  My concern as conveyed to the patient was the respiratory depression.  Explained that Dr Andree Elk is not here this week and I felt that this should be addressed prior to next week.    Spoke with Dr Holley Raring and he does not want to change any medication regimen, other than to stop the morphine as the patient has already done.  He recommends continuing percocet - apap bid and adding tylenol 500 mg bid between doses to help with her pain and have her see Dr Andree Elk next week.   Called patient to let her know Dr Elwyn Lade response about medication.  I also explained that as the Rx was written to be filled on 09/26/20 that her medication should last until 10/26/20.  Patient states that it will not last until then.  Told patient I would type up the message and have scheduling call her with appt time.

## 2020-10-06 NOTE — Telephone Encounter (Signed)
The narcotic that Dr. Andree Elk put her on a couple of months ago is giving her breathing problems. She did have covid in December. Please call her re this. She states she stopped taking it for a couple of days and the breathing issues stopped.

## 2020-10-07 NOTE — Telephone Encounter (Signed)
Scheduled her for Monday at 2pm left her a vm letting her know

## 2020-10-12 ENCOUNTER — Ambulatory Visit: Payer: Commercial Managed Care - PPO | Admitting: Anesthesiology

## 2020-10-15 ENCOUNTER — Other Ambulatory Visit: Payer: Self-pay

## 2020-10-15 ENCOUNTER — Encounter: Payer: Self-pay | Admitting: Anesthesiology

## 2020-10-15 ENCOUNTER — Ambulatory Visit: Payer: Commercial Managed Care - PPO | Attending: Anesthesiology | Admitting: Anesthesiology

## 2020-10-15 DIAGNOSIS — M25532 Pain in left wrist: Secondary | ICD-10-CM

## 2020-10-15 DIAGNOSIS — M5441 Lumbago with sciatica, right side: Secondary | ICD-10-CM

## 2020-10-15 DIAGNOSIS — M25549 Pain in joints of unspecified hand: Secondary | ICD-10-CM

## 2020-10-15 DIAGNOSIS — M5442 Lumbago with sciatica, left side: Secondary | ICD-10-CM

## 2020-10-15 DIAGNOSIS — M25511 Pain in right shoulder: Secondary | ICD-10-CM

## 2020-10-15 DIAGNOSIS — G894 Chronic pain syndrome: Secondary | ICD-10-CM

## 2020-10-15 DIAGNOSIS — M25552 Pain in left hip: Secondary | ICD-10-CM

## 2020-10-15 DIAGNOSIS — M25512 Pain in left shoulder: Secondary | ICD-10-CM

## 2020-10-15 DIAGNOSIS — L4050 Arthropathic psoriasis, unspecified: Secondary | ICD-10-CM | POA: Diagnosis not present

## 2020-10-15 DIAGNOSIS — R29898 Other symptoms and signs involving the musculoskeletal system: Secondary | ICD-10-CM

## 2020-10-15 DIAGNOSIS — M533 Sacrococcygeal disorders, not elsewhere classified: Secondary | ICD-10-CM

## 2020-10-15 DIAGNOSIS — G8929 Other chronic pain: Secondary | ICD-10-CM

## 2020-10-15 DIAGNOSIS — F1190 Opioid use, unspecified, uncomplicated: Secondary | ICD-10-CM

## 2020-10-15 MED ORDER — OXYCODONE-ACETAMINOPHEN 10-325 MG PO TABS: 1.0000 | ORAL_TABLET | Freq: Three times a day (TID) | ORAL | 0 refills | Status: DC | PRN

## 2020-10-15 MED ORDER — MORPHINE SULFATE ER 30 MG PO TBCR: 30.0000 mg | EXTENDED_RELEASE_TABLET | Freq: Two times a day (BID) | ORAL | 0 refills | Status: AC

## 2020-10-15 MED ORDER — OXYCODONE-ACETAMINOPHEN 10-325 MG PO TABS: 1.0000 | ORAL_TABLET | Freq: Two times a day (BID) | ORAL | 0 refills | Status: AC | PRN

## 2020-10-15 MED ORDER — MORPHINE SULFATE ER 30 MG PO TBCR: 30.0000 mg | EXTENDED_RELEASE_TABLET | Freq: Two times a day (BID) | ORAL | 0 refills | Status: DC

## 2020-10-15 NOTE — Progress Notes (Signed)
Virtual Visit via Video Note  I connected with Krista Kennedy on 10/15/20 at  2:00 PM EST by a video enabled telemedicine application and verified that I am speaking with the correct person using two identifiers.  Location: Patient: Home Provider: Pain control center   I discussed the limitations of evaluation and management by telemedicine and the availability of in person appointments. The patient expressed understanding and agreed to proceed.  History of Present Illness: I spoke with Krista Kennedy via video today for her virtual conference.  She reports that she is doing well with her low back pain and having some increased leg pain.  The back pain is still mainly muscular and worse with certain activity and prolonged standing.  She occasionally has some sciatica-like symptoms and these have been more protracted and troublesome lately.  She is trying to do some stretching exercises and stay active.  She also has some diffuse pain secondary to her other chronic pain conditions.  She has psoriatic arthritis and has some left hip and hand pain wrist pain and this is doing well with the recent change to the MS Contin taking this 30 mg twice a day and she started this approximately 2 months ago.  She also takes Percocet 10 mg tablets twice a day and this combination is given her good relief with no side effect.  She reports better overall control she is sleeping better at night and able to manage her pain more effectively.  Otherwise she is in her usual state of health no new contributory changes.  The quality of her pain is stable without change.   Observations/Objective:   Current Outpatient Medications:  .  [START ON 12/09/2020] morphine (MS CONTIN) 30 MG 12 hr tablet, Take 1 tablet (30 mg total) by mouth every 12 (twelve) hours., Disp: 60 tablet, Rfl: 0 .  [START ON 11/21/2020] oxyCODONE-acetaminophen (PERCOCET) 10-325 MG tablet, Take 1 tablet by mouth every 8 (eight) hours as needed for pain., Disp: 60  tablet, Rfl: 0 .  albuterol (VENTOLIN HFA) 108 (90 Base) MCG/ACT inhaler, Inhale 2 puffs into the lungs every 6 (six) hours as needed for wheezing or shortness of breath., Disp: 1 each, Rfl: 0 .  Ascorbic Acid (VITAMIN C) 1000 MG tablet, Take 1,000 mg by mouth daily., Disp: , Rfl:  .  carvedilol (COREG) 25 MG tablet, TAKE 1 TABLET(25 MG) BY MOUTH TWICE DAILY WITH A MEAL, Disp: 180 tablet, Rfl: 0 .  Cholecalciferol (VITAMIN D-1000 MAX ST) 1000 UNITS tablet, Take by mouth daily. , Disp: , Rfl:  .  clobetasol (TEMOVATE) 0.05 % external solution, Apply 1 application topically as needed. , Disp: , Rfl:  .  estradiol (CLIMARA - DOSED IN MG/24 HR) 0.025 mg/24hr patch, Place 1 patch (0.025 mg total) onto the skin once a week., Disp: 4 patch, Rfl: 0 .  fluticasone (FLONASE) 50 MCG/ACT nasal spray, Place 2 sprays into both nostrils daily., Disp: 16 g, Rfl: 3 .  Ixekizumab (TALTZ) 80 MG/ML SOSY, Inject 80 mg into the skin as directed. [facility], Disp: , Rfl:  .  levothyroxine (SYNTHROID) 100 MCG tablet, Take 1 tablet (100 mcg total) by mouth daily before breakfast., Disp: 90 tablet, Rfl: 0 .  lisinopril (ZESTRIL) 20 MG tablet, TAKE 1 TABLET(20 MG) BY MOUTH DAILY, Disp: 90 tablet, Rfl: 0 .  [START ON 11/09/2020] morphine (MS CONTIN) 30 MG 12 hr tablet, Take 1 tablet (30 mg total) by mouth every 12 (twelve) hours., Disp: 60 tablet, Rfl: 0 .  Multiple Vitamin (MULTI VITAMIN PO), Take 1 tablet by mouth daily., Disp: , Rfl:  .  naloxone (NARCAN) nasal spray 4 mg/0.1 mL, To reverse excess sedation from narcotics, Disp: 2 kit, Rfl: 1 .  omeprazole (PRILOSEC) 40 MG capsule, Take 1 capsule (40 mg total) by mouth daily., Disp: 30 capsule, Rfl: 0 .  [START ON 10/26/2020] oxyCODONE-acetaminophen (PERCOCET) 10-325 MG tablet, Take 1 tablet by mouth 2 (two) times daily as needed for pain., Disp: 60 tablet, Rfl: 0 .  promethazine (PHENERGAN) 12.5 MG tablet, Take 1 tablet (12.5 mg total) by mouth every 6 (six) hours as needed  for up to 30 doses for nausea or vomiting., Disp: 30 tablet, Rfl: 0 .  Sod Picosulfate-Mag Ox-Cit Acd (CLENPIQ) 10-3.5-12 MG-GM -GM/160ML SOLN, Take 1 bottle at 5 PM followed by five 8 oz cups of water and repeat 5 hours before procedure., Disp: 320 mL, Rfl: 0 .  spironolactone (ALDACTONE) 25 MG tablet, TAKE 1 TABLET(25 MG) BY MOUTH DAILY, Disp: 30 tablet, Rfl: 0 Assessment and Plan: 1. Psoriasis with arthropathy ([place])   2. Chronic pain of both shoulders   3. Chronic bilateral low back pain with bilateral sciatica   4. Pain in multiple finger joints   5. Chronic pain syndrome   6. Chronic, continuous use of opioids   7. Chronic left hip pain   8. Left wrist pain   9. Psoriatic arthritis ([place])   10. Chronic sacroiliac joint pain   11. Transient weakness of left leg   Based on her discussion today and upon review of the [place] practitioner database information going to refill her medications for the next 2 months.  This will be for 2 refills on her MS Contin 30 mg tablets extended relief and her Percocet 10 mg tablets twice a day for breakthrough pain.  No other changes are initiated in her pain control protocol.  I encouraged her to continue follow-up with her primary care physicians and continue to be active with stretching strengthening exercises as reviewed.  Return to clinic in 2 months  Follow Up Instructions:    I discussed the assessment and treatment plan with the patient. The patient was provided an opportunity to ask questions and all were answered. The patient agreed with the plan and demonstrated an understanding of the instructions.   The patient was advised to call back or seek an in-person evaluation if the symptoms worsen or if the condition fails to improve as anticipated.  I provided 30 minutes of non-face-to-face time during this encounter.   Molli Barrows, MD

## 2020-10-27 ENCOUNTER — Other Ambulatory Visit: Payer: Self-pay

## 2020-10-27 NOTE — Telephone Encounter (Signed)
Last office visit 06/08/2020 Nausea and vomiting  Last refill 07/20/2020 0 refills

## 2020-10-28 MED ORDER — PROMETHAZINE HCL 12.5 MG PO TABS
12.5000 mg | ORAL_TABLET | Freq: Four times a day (QID) | ORAL | 0 refills | Status: DC | PRN
Start: 2020-10-28 — End: 2021-03-23

## 2020-10-29 ENCOUNTER — Other Ambulatory Visit: Payer: Self-pay | Admitting: Gastroenterology

## 2020-12-02 ENCOUNTER — Other Ambulatory Visit: Payer: Self-pay | Admitting: Cardiovascular Disease

## 2020-12-02 DIAGNOSIS — I5022 Chronic systolic (congestive) heart failure: Secondary | ICD-10-CM

## 2020-12-02 NOTE — Telephone Encounter (Signed)
Patient scheduled for 12 month on 4/1. Please send in RX to [facility] in [place]

## 2020-12-03 NOTE — Progress Notes (Incomplete)
Cardiology Office Note:    Date:  12/03/2020   ID:  Krista Kennedy, DOB 24-Nov-1965, MRN [id number]  PCP:  Patient, No Pcp Per (Inactive)  [facility] Cardiologist:  Ida Rogue, MD  [facility] Electrophysiologist:  None   Referring MD: No ref. provider found   Chief Complaint: 31-month follow-up  History of Present Illness:    Krista Kennedy is a 55 y.o. female with a hx of cardiomyopathy in 2002 with prior EF of 25% improved to 55% by echo 2014, hypertension, hypothyroidism, prior tobacco use, chronic pain syndrome, GERD who presents for follow-up.  Past Medical History:  Diagnosis Date  . Arthritis    Psoriatic  . Cardiomyopathy ([place])   . CHF (congestive heart failure) ([place])   . Hyperlipidemia   . Hypertension   . Psoriasis   . Thyroid disease   . Transient weakness of left leg 08/13/2019  . Wears contact lenses     Past Surgical History:  Procedure Laterality Date  . COLONOSCOPY WITH PROPOFOL N/A 06/30/2020   Procedure: COLONOSCOPY WITH PROPOFOL;  Surgeon: Virgel Manifold, MD;  Location: [place];  Service: Endoscopy;  Laterality: N/A;  . COLONOSCOPY WITH PROPOFOL N/A 07/20/2020   Procedure: COLONOSCOPY WITH PROPOFOL;  Surgeon: Virgel Manifold, MD;  Location: [place];  Service: Endoscopy;  Laterality: N/A;  poor prep  . ENDOMETRIAL ABLATION  2015  . ESOPHAGOGASTRODUODENOSCOPY (EGD) WITH PROPOFOL N/A 06/30/2020   Procedure: ESOPHAGOGASTRODUODENOSCOPY (EGD) WITH BIOPSY;  Surgeon: Virgel Manifold, MD;  Location: [place];  Service: Endoscopy;  Laterality: N/A;  . THYROIDECTOMY    . TONSILLECTOMY    . TUBAL LIGATION      Current Medications: No outpatient medications have been marked as taking for the 12/04/20 encounter (Appointment) with Kathlen Mody, Cuyler Vandyken H, PA-C.     Allergies:   Other, Sulfa antibiotics, and Sulfacetamide sodium   Social History   Socioeconomic History  . Marital status: Married    Spouse  name: Not on file  . Number of children: Not on file  . Years of education: Not on file  . Highest education level: Not on file  Occupational History  . Not on file  Tobacco Use  . Smoking status: Former Smoker    Packs/day: 0.25    Years: 15.00    Pack years: 3.75    Types: Cigarettes    Quit date: 2012    Years since quitting: 10.2  . Smokeless tobacco: Former Network engineer  . Vaping Use: Former  . Substances: Flavoring  Substance and Sexual Activity  . Alcohol use: Not Currently    Comment: social  . Drug use: No  . Sexual activity: Yes    Birth control/protection: None  Other Topics Concern  . Not on file  Social History Narrative  . Not on file   Social Determinants of Health   Financial Resource Strain: Not on file  Food Insecurity: Not on file  Transportation Needs: Not on file  Physical Activity: Not on file  Stress: Not on file  Social Connections: Not on file     Family History: The patient's ***family history includes Diabetes in her father; Heart disease in her father; Hyperlipidemia in her father; Hypertension in her father and mother.  ROS:   Please see the history of present illness.    *** All other systems reviewed and are negative.  EKGs/Labs/Other Studies Reviewed:    The following studies were reviewed today: ***  EKG:  EKG  is *** ordered today.  The ekg ordered today demonstrates ***  Recent Labs: 05/06/2020: ALT 16; TSH 0.130 09/04/2020: BUN 24; Creatinine, Ser 1.29; Hemoglobin 13.4; Platelets 270; Potassium 3.9; Sodium 136  Recent Lipid Panel    Component Value Date/Time   CHOL 200 (H) 05/06/2020 1130   TRIG 122 05/06/2020 1130   HDL 60 05/06/2020 1130   CHOLHDL 4.7 07/20/2018 0959   VLDL 20 04/11/2017 1103   LDLCALC 118 (H) 05/06/2020 1130   LDLCALC 129 (H) 07/20/2018 0959     Risk Assessment/Calculations:   {Does this patient have ATRIAL FIBRILLATION?[phone]}   Physical Exam:    VS:  There were no vitals taken  for this visit.    Wt Readings from Last 3 Encounters:  07/20/20 163 lb (73.9 kg)  07/02/20 165 lb (74.8 kg)  06/30/20 165 lb (74.8 kg)     GEN: *** Well nourished, well developed in no acute distress HEENT: Normal NECK: No JVD; No carotid bruits LYMPHATICS: No lymphadenopathy CARDIAC: ***RRR, no murmurs, rubs, gallops RESPIRATORY:  Clear to auscultation without rales, wheezing or rhonchi  ABDOMEN: Soft, non-tender, non-distended MUSCULOSKELETAL:  No edema; No deformity  SKIN: Warm and dry NEUROLOGIC:  Alert and oriented x 3 PSYCHIATRIC:  Normal affect   ASSESSMENT:    No diagnosis found. PLAN:    In order of problems listed above:  1. ***  Disposition: Follow up {follow up:15908} with ***   Shared Decision Making/Informed Consent   {Are you ordering a CV Procedure (e.g. stress test, cath, DCCV, TEE, etc)?   Press F2        :[id number]}    Signed, Zyren Sevigny Arlyss Repress  12/03/2020 8:37 AM    [facility]

## 2020-12-04 ENCOUNTER — Ambulatory Visit: Payer: Commercial Managed Care - PPO | Admitting: Medical

## 2020-12-16 ENCOUNTER — Encounter: Payer: Self-pay | Admitting: Anesthesiology

## 2020-12-16 ENCOUNTER — Other Ambulatory Visit: Payer: Self-pay

## 2020-12-16 ENCOUNTER — Ambulatory Visit: Payer: Commercial Managed Care - PPO | Attending: Anesthesiology | Admitting: Anesthesiology

## 2020-12-16 DIAGNOSIS — M5442 Lumbago with sciatica, left side: Secondary | ICD-10-CM | POA: Diagnosis not present

## 2020-12-16 DIAGNOSIS — L4050 Arthropathic psoriasis, unspecified: Secondary | ICD-10-CM

## 2020-12-16 DIAGNOSIS — M5441 Lumbago with sciatica, right side: Secondary | ICD-10-CM

## 2020-12-16 DIAGNOSIS — G894 Chronic pain syndrome: Secondary | ICD-10-CM

## 2020-12-16 DIAGNOSIS — M25549 Pain in joints of unspecified hand: Secondary | ICD-10-CM | POA: Diagnosis not present

## 2020-12-16 DIAGNOSIS — F1190 Opioid use, unspecified, uncomplicated: Secondary | ICD-10-CM

## 2020-12-16 DIAGNOSIS — M533 Sacrococcygeal disorders, not elsewhere classified: Secondary | ICD-10-CM

## 2020-12-16 DIAGNOSIS — M25552 Pain in left hip: Secondary | ICD-10-CM

## 2020-12-16 DIAGNOSIS — M25532 Pain in left wrist: Secondary | ICD-10-CM

## 2020-12-16 DIAGNOSIS — M25511 Pain in right shoulder: Secondary | ICD-10-CM

## 2020-12-16 DIAGNOSIS — G8929 Other chronic pain: Secondary | ICD-10-CM

## 2020-12-16 DIAGNOSIS — M25512 Pain in left shoulder: Secondary | ICD-10-CM

## 2020-12-16 MED ORDER — MORPHINE SULFATE ER 30 MG PO TBCR: 30.0000 mg | EXTENDED_RELEASE_TABLET | Freq: Two times a day (BID) | ORAL | 0 refills | Status: AC

## 2020-12-16 MED ORDER — OXYCODONE-ACETAMINOPHEN 10-325 MG PO TABS: 1.0000 | ORAL_TABLET | Freq: Two times a day (BID) | ORAL | 0 refills | Status: DC | PRN

## 2020-12-16 MED ORDER — OXYCODONE-ACETAMINOPHEN 10-325 MG PO TABS: 1.0000 | ORAL_TABLET | Freq: Three times a day (TID) | ORAL | 0 refills | Status: AC | PRN

## 2020-12-16 MED ORDER — MORPHINE SULFATE ER 30 MG PO TBCR: 30.0000 mg | EXTENDED_RELEASE_TABLET | Freq: Two times a day (BID) | ORAL | 0 refills | Status: DC

## 2020-12-16 MED ORDER — OXYCODONE-ACETAMINOPHEN 10-325 MG PO TABS: 1.0000 | ORAL_TABLET | Freq: Two times a day (BID) | ORAL | 0 refills | Status: AC | PRN

## 2020-12-16 NOTE — Progress Notes (Signed)
Virtual Visit via Telephone Note  I connected with Krista Kennedy on 12/16/20 at  2:15 PM EDT by telephone and verified that I am speaking with the correct person using two identifiers.  Location: Patient: Home Provider: Pain control center   I discussed the limitations, risks, security and privacy concerns of performing an evaluation and management service by telephone and the availability of in person appointments. I also discussed with the patient that there may be a patient responsible charge related to this service. The patient expressed understanding and agreed to proceed.   History of Present Illness: I spoke with Krista Kennedy today via telephone as we were unable to link for the video portion of the virtual conference and she reports that she is doing reasonably well with her diffuse body pain.  The pain that she is experiencing is comparable to what she has had chronically and no significant changes in the quality characteristic or distribution are reported today.  She is taking her medications and these are working well although she reports some mild gastritis type symptoms with the extended release morphine.  This has gotten better over the last several days.  She takes her medications as prescribed with the long-acting opioid twice a day and when she has breakthrough pain she uses the Percocet 10 mg tablets twice a day as needed.  This combination yields no side effects per our discussion today.  She gets good relief describing approximately 50 to 70% improvement in pain lasting several hours for the MS Contin with the Percocet helping for breakthrough pain.  Otherwise she is in her usual state of health with no other changes reported today.   Observations/Objective:  Current Outpatient Medications:  .  [START ON 02/04/2021] morphine (MS CONTIN) 30 MG 12 hr tablet, Take 1 tablet (30 mg total) by mouth every 12 (twelve) hours., Disp: 60 tablet, Rfl: 0 .  [START ON 01/05/2021] oxyCODONE-acetaminophen  (PERCOCET) 10-325 MG tablet, Take 1 tablet by mouth 2 (two) times daily as needed for pain., Disp: 60 tablet, Rfl: 0 .  [START ON 02/04/2021] oxyCODONE-acetaminophen (PERCOCET) 10-325 MG tablet, Take 1 tablet by mouth 2 (two) times daily as needed for pain., Disp: 60 tablet, Rfl: 0 .  albuterol (VENTOLIN HFA) 108 (90 Base) MCG/ACT inhaler, Inhale 2 puffs into the lungs every 6 (six) hours as needed for wheezing or shortness of breath., Disp: 1 each, Rfl: 0 .  Ascorbic Acid (VITAMIN C) 1000 MG tablet, Take 1,000 mg by mouth daily., Disp: , Rfl:  .  carvedilol (COREG) 25 MG tablet, TAKE 1 TABLET(25 MG) BY MOUTH TWICE DAILY WITH A MEAL, Disp: 180 tablet, Rfl: 0 .  Cholecalciferol (VITAMIN D-1000 MAX ST) 1000 UNITS tablet, Take by mouth daily. , Disp: , Rfl:  .  clobetasol (TEMOVATE) 0.05 % external solution, Apply 1 application topically as needed. , Disp: , Rfl:  .  estradiol (CLIMARA - DOSED IN MG/24 HR) 0.025 mg/24hr patch, Place 1 patch (0.025 mg total) onto the skin once a week., Disp: 4 patch, Rfl: 0 .  fluticasone (FLONASE) 50 MCG/ACT nasal spray, Place 2 sprays into both nostrils daily., Disp: 16 g, Rfl: 3 .  Ixekizumab (TALTZ) 80 MG/ML SOSY, Inject 80 mg into the skin as directed. [facility], Disp: , Rfl:  .  levothyroxine (SYNTHROID) 100 MCG tablet, Take 1 tablet (100 mcg total) by mouth daily before breakfast., Disp: 90 tablet, Rfl: 0 .  lisinopril (ZESTRIL) 20 MG tablet, TAKE 1 TABLET(20 MG) BY MOUTH DAILY, Disp: 90  tablet, Rfl: 0 .  [START ON 01/05/2021] morphine (MS CONTIN) 30 MG 12 hr tablet, Take 1 tablet (30 mg total) by mouth every 12 (twelve) hours., Disp: 60 tablet, Rfl: 0 .  Multiple Vitamin (MULTI VITAMIN PO), Take 1 tablet by mouth daily., Disp: , Rfl:  .  naloxone (NARCAN) nasal spray 4 mg/0.1 mL, To reverse excess sedation from narcotics, Disp: 2 kit, Rfl: 1 .  omeprazole (PRILOSEC) 40 MG capsule, Take 1 capsule (40 mg total) by mouth daily., Disp: 30 capsule, Rfl: 0 .  [START ON  12/22/2020] oxyCODONE-acetaminophen (PERCOCET) 10-325 MG tablet, Take 1 tablet by mouth every 8 (eight) hours as needed for up to 14 days for pain., Disp: 28 tablet, Rfl: 0 .  promethazine (PHENERGAN) 12.5 MG tablet, Take 1 tablet (12.5 mg total) by mouth every 6 (six) hours as needed for up to 30 doses for nausea or vomiting., Disp: 30 tablet, Rfl: 0 .  Sod Picosulfate-Mag Ox-Cit Acd (CLENPIQ) 10-3.5-12 MG-GM -GM/160ML SOLN, Take 1 bottle at 5 PM followed by five 8 oz cups of water and repeat 5 hours before procedure., Disp: 320 mL, Rfl: 0 .  spironolactone (ALDACTONE) 25 MG tablet, TAKE 1 TABLET(25 MG) BY MOUTH DAILY, Disp: 30 tablet, Rfl: 0  Assessment and Plan:  1. Psoriasis with arthropathy ([place])   2. Chronic pain of both shoulders   3. Chronic bilateral low back pain with bilateral sciatica   4. Pain in multiple finger joints   5. Chronic pain syndrome   6. Chronic, continuous use of opioids   7. Chronic left hip pain   8. Left wrist pain   9. Psoriatic arthritis ([place])   10. Chronic sacroiliac joint pain   Unfortunately Krista Kennedy continues to have a lot of discomfort from her psoriatic arthritis but seems to be well served with her current regimen of chronic opioid pain medications.  She has failed more conservative therapy and this seems to be working well she reports.  Otherwise she is in persistent distress.  No side effects are reported and she seems to be stable with this regimen.  I have reviewed the [facility] practitioner database information and it is appropriate for refills.  These will be given for the next 2 months with a return to clinic scheduled in 2 months.  I encouraged her to continue follow-up with her primary care physicians and rheumatologist. Follow Up Instructions:    I discussed the assessment and treatment plan with the patient. The patient was provided an opportunity to ask questions and all were answered. The patient agreed with the plan and demonstrated an  understanding of the instructions.   The patient was advised to call back or seek an in-person evaluation if the symptoms worsen or if the condition fails to improve as anticipated.  I provided 30 minutes of non-face-to-face time during this encounter.   Molli Barrows, MD

## 2021-02-05 ENCOUNTER — Other Ambulatory Visit: Payer: Self-pay | Admitting: Gastroenterology

## 2021-02-05 ENCOUNTER — Telehealth: Payer: Self-pay | Admitting: Anesthesiology

## 2021-02-05 ENCOUNTER — Other Ambulatory Visit: Payer: Self-pay | Admitting: Cardiovascular Disease

## 2021-02-05 DIAGNOSIS — I5022 Chronic systolic (congestive) heart failure: Secondary | ICD-10-CM

## 2021-02-05 NOTE — Telephone Encounter (Signed)
Patient needs prior auth for meds please.

## 2021-02-08 NOTE — Telephone Encounter (Signed)
PA was approved, patient notified

## 2021-02-08 NOTE — Telephone Encounter (Signed)
Called patient to ask which med needs PA. Message left.

## 2021-03-05 ENCOUNTER — Ambulatory Visit: Payer: Commercial Managed Care - PPO | Attending: Anesthesiology | Admitting: Anesthesiology

## 2021-03-05 ENCOUNTER — Other Ambulatory Visit: Payer: Self-pay

## 2021-03-05 DIAGNOSIS — G8929 Other chronic pain: Secondary | ICD-10-CM

## 2021-03-05 DIAGNOSIS — M25532 Pain in left wrist: Secondary | ICD-10-CM

## 2021-03-05 DIAGNOSIS — L4050 Arthropathic psoriasis, unspecified: Secondary | ICD-10-CM

## 2021-03-05 DIAGNOSIS — F1190 Opioid use, unspecified, uncomplicated: Secondary | ICD-10-CM

## 2021-03-05 DIAGNOSIS — M25512 Pain in left shoulder: Secondary | ICD-10-CM

## 2021-03-05 DIAGNOSIS — G894 Chronic pain syndrome: Secondary | ICD-10-CM

## 2021-03-05 DIAGNOSIS — M5441 Lumbago with sciatica, right side: Secondary | ICD-10-CM

## 2021-03-05 DIAGNOSIS — M5442 Lumbago with sciatica, left side: Secondary | ICD-10-CM | POA: Diagnosis not present

## 2021-03-05 DIAGNOSIS — M25511 Pain in right shoulder: Secondary | ICD-10-CM | POA: Diagnosis not present

## 2021-03-05 DIAGNOSIS — M25549 Pain in joints of unspecified hand: Secondary | ICD-10-CM

## 2021-03-05 DIAGNOSIS — M25552 Pain in left hip: Secondary | ICD-10-CM

## 2021-03-05 MED ORDER — MORPHINE SULFATE ER 30 MG PO TBCR: 30.0000 mg | EXTENDED_RELEASE_TABLET | Freq: Two times a day (BID) | ORAL | 0 refills | Status: AC

## 2021-03-05 MED ORDER — OXYCODONE-ACETAMINOPHEN 10-325 MG PO TABS: 1.0000 | ORAL_TABLET | Freq: Two times a day (BID) | ORAL | 0 refills | Status: DC

## 2021-03-05 MED ORDER — OXYCODONE-ACETAMINOPHEN 10-325 MG PO TABS: 1.0000 | ORAL_TABLET | Freq: Two times a day (BID) | ORAL | 0 refills | Status: AC | PRN

## 2021-03-06 ENCOUNTER — Other Ambulatory Visit: Payer: Self-pay | Admitting: Cardiovascular Disease

## 2021-03-06 DIAGNOSIS — I5022 Chronic systolic (congestive) heart failure: Secondary | ICD-10-CM

## 2021-03-09 NOTE — Telephone Encounter (Signed)
Please schedule overdue F/U appointment for refills. Thank you! 

## 2021-03-10 ENCOUNTER — Encounter: Payer: Self-pay | Admitting: Anesthesiology

## 2021-03-10 NOTE — Progress Notes (Signed)
Virtual Visit via Telephone Note  I connected with [name] on 03/10/21 at  8:00 AM EDT by telephone and verified that I am speaking with the correct person using two identifiers.  Location: Patient: Home Provider: Pain control center   I discussed the limitations, risks, security and privacy concerns of performing an evaluation and management service by telephone and the availability of in person appointments. I also discussed with the patient that there may be a patient responsible charge related to this service. The patient expressed understanding and agreed to proceed.   History of Present Illness:  I spoke with Krista Kennedy via telephone in regards to her low back pain.  We are not able to perform the video portion of the evaluation but she states that she is doing well with her current regimen for pain management.  She takes her opioids as prescribed and these continue to give her good relief.  No side effects are reported.  She derives good functional benefit from diffuse pain that she experiences.  The quality is characteristic and distribution of the pain have been stable in nature with no reported changes.  Otherwise she is in her usual state of health at this time.  Review of systems: General: No fevers or chills Pulmonary: No shortness of breath or dyspnea Cardiac: No angina or palpitations or lightheadedness GI: No abdominal pain or constipation Psych: No depression  Observations/Objective:  Current Outpatient Medications:    [START ON 04/22/2021] morphine (MS CONTIN) 30 MG 12 hr tablet, Take 1 tablet (30 mg total) by mouth every 12 (twelve) hours., Disp: 60 tablet, Rfl: 0   [START ON 03/23/2021] oxyCODONE-acetaminophen (PERCOCET) 10-325 MG tablet, Take 1 tablet by mouth 2 (two) times daily as needed for pain., Disp: 60 tablet, Rfl: 0   [START ON 04/23/2021] oxyCODONE-acetaminophen (PERCOCET) 10-325 MG tablet, Take 1 tablet by mouth in the morning and at bedtime., Disp: 60 tablet,  Rfl: 0   albuterol (VENTOLIN HFA) 108 (90 Base) MCG/ACT inhaler, Inhale 2 puffs into the lungs every 6 (six) hours as needed for wheezing or shortness of breath., Disp: 1 each, Rfl: 0   Ascorbic Acid (VITAMIN C) 1000 MG tablet, Take 1,000 mg by mouth daily., Disp: , Rfl:    carvedilol (COREG) 25 MG tablet, TAKE 1 TABLET(25 MG) BY MOUTH TWICE DAILY WITH A MEAL, Disp: 180 tablet, Rfl: 0   Cholecalciferol (VITAMIN D-1000 MAX ST) 1000 UNITS tablet, Take by mouth daily. , Disp: , Rfl:    clobetasol (TEMOVATE) 0.05 % external solution, Apply 1 application topically as needed. , Disp: , Rfl:    estradiol (CLIMARA - DOSED IN MG/24 HR) 0.025 mg/24hr patch, Place 1 patch (0.025 mg total) onto the skin once a week., Disp: 4 patch, Rfl: 0   fluticasone (FLONASE) 50 MCG/ACT nasal spray, Place 2 sprays into both nostrils daily., Disp: 16 g, Rfl: 3   Ixekizumab (TALTZ) 80 MG/ML SOSY, Inject 80 mg into the skin as directed. [facility], Disp: , Rfl:    levothyroxine (SYNTHROID) 100 MCG tablet, Take 1 tablet (100 mcg total) by mouth daily before breakfast., Disp: 90 tablet, Rfl: 0   lisinopril (ZESTRIL) 20 MG tablet, TAKE 1 TABLET(20 MG) BY MOUTH DAILY, Disp: 90 tablet, Rfl: 0   [START ON 03/23/2021] morphine (MS CONTIN) 30 MG 12 hr tablet, Take 1 tablet (30 mg total) by mouth every 12 (twelve) hours., Disp: 60 tablet, Rfl: 0   Multiple Vitamin (MULTI VITAMIN PO), Take 1 tablet by mouth daily., Disp: ,  Rfl:    naloxone (NARCAN) nasal spray 4 mg/0.1 mL, To reverse excess sedation from narcotics, Disp: 2 kit, Rfl: 1   omeprazole (PRILOSEC) 40 MG capsule, Take 1 capsule (40 mg total) by mouth daily., Disp: 30 capsule, Rfl: 0   oxyCODONE-acetaminophen (PERCOCET) 10-325 MG tablet, Take 1 tablet by mouth 2 (two) times daily as needed for up to 18 days for pain., Disp: 36 tablet, Rfl: 0   promethazine (PHENERGAN) 12.5 MG tablet, Take 1 tablet (12.5 mg total) by mouth every 6 (six) hours as needed for up to 30 doses for  nausea or vomiting., Disp: 30 tablet, Rfl: 0   Sod Picosulfate-Mag Ox-Cit Acd (CLENPIQ) 10-3.5-12 MG-GM -GM/160ML SOLN, Take 1 bottle at 5 PM followed by five 8 oz cups of water and repeat 5 hours before procedure., Disp: 320 mL, Rfl: 0   spironolactone (ALDACTONE) 25 MG tablet, TAKE 1 TABLET(25 MG) BY MOUTH DAILY, Disp: 30 tablet, Rfl: 0   Assessment and Plan: 1. Psoriasis with arthropathy ([place])   2. Chronic pain of both shoulders   3. Chronic bilateral low back pain with bilateral sciatica   4. Pain in multiple finger joints   5. Chronic pain syndrome   6. Chronic, continuous use of opioids   7. Chronic left hip pain   8. Left wrist pain   9. Psoriatic arthritis ([place])   Based on our evaluation and review today is appropriate to continue her current regimen with MS Contin for baseline control and short acting opioids for breakthrough for pain medications.  I have reviewed the [facility] practitioner database information and it is also appropriate.  Refills to be given for July 19 and August 18 with schedule return to clinic in 2 months.  No other changes will be initiated today.  I have encouraged her to continue follow-up with her primary care physicians for baseline medical care.  Follow Up Instructions:    I discussed the assessment and treatment plan with the patient. The patient was provided an opportunity to ask questions and all were answered. The patient agreed with the plan and demonstrated an understanding of the instructions.   The patient was advised to call back or seek an in-person evaluation if the symptoms worsen or if the condition fails to improve as anticipated.  I provided 30 minutes of non-face-to-face time during this encounter.   Molli Barrows, MD

## 2021-03-11 NOTE — Telephone Encounter (Signed)
Scheduled for 8/12, stills 90 days sent in for insurance to pay for it

## 2021-03-17 ENCOUNTER — Ambulatory Visit
Admission: RE | Admit: 2021-03-17 | Discharge: 2021-03-17 | Disposition: A | Discharge status: Home / Self Care | Payer: Commercial Managed Care - PPO | Source: Ambulatory Visit | Attending: Family Medicine | Admitting: Family Medicine

## 2021-03-17 ENCOUNTER — Other Ambulatory Visit: Payer: Self-pay

## 2021-03-17 ENCOUNTER — Telehealth: Payer: Self-pay

## 2021-03-17 VITALS — BP 115/61 | HR 89 | Temp 98.5°F | Resp 18 | Ht 70.0 in | Wt 162.9 lb

## 2021-03-17 DIAGNOSIS — N3001 Acute cystitis with hematuria: Secondary | ICD-10-CM | POA: Diagnosis not present

## 2021-03-17 DIAGNOSIS — A084 Viral intestinal infection, unspecified: Secondary | ICD-10-CM

## 2021-03-17 LAB — URINALYSIS, COMPLETE (UACMP) WITH MICROSCOPIC
Bilirubin Urine: NEGATIVE
Glucose, UA: NEGATIVE mg/dL
Ketones, ur: NEGATIVE mg/dL
Leukocytes,Ua: NEGATIVE
Nitrite: NEGATIVE
Protein, ur: NEGATIVE mg/dL
Specific Gravity, Urine: 1.025 (ref 1.005–1.030)
pH: 6 (ref 5.0–8.0)

## 2021-03-17 MED ORDER — ONDANSETRON 8 MG PO TBDP
8.0000 mg | ORAL_TABLET | Freq: Once | ORAL | Status: AC
  Administered 2021-03-17: 8 mg via ORAL

## 2021-03-17 MED ORDER — ONDANSETRON 4 MG PO TBDP: 4.0000 mg | ORAL_TABLET | Freq: Three times a day (TID) | ORAL | 0 refills | Status: DC | PRN

## 2021-03-17 MED ORDER — CEPHALEXIN 500 MG PO CAPS: 500.0000 mg | ORAL_CAPSULE | Freq: Two times a day (BID) | ORAL | 0 refills | Status: DC

## 2021-03-17 NOTE — Discharge Instructions (Addendum)
Hold your lisinopril for a couple of days.  Medication as prescribed.  Lots of fluids.  If you worsen, go to the ER.

## 2021-03-17 NOTE — Telephone Encounter (Signed)
Last office visit 06/08/2020 Nausea and vomiting Last refill 10/28/2020 0 refills  No up coming appts   Please advise

## 2021-03-17 NOTE — ED Triage Notes (Signed)
Pt c/o nausea, vomiting and diarrhea. Started yesterday. She states she also had dysuria, urinary frequency, retention, and lower back pain. Started about a week ago. Denies fever.

## 2021-03-17 NOTE — ED Provider Notes (Signed)
[facility]    CSN: [id number] Arrival date & time: 03/17/21  1527      History   Chief Complaint Chief Complaint  Patient presents with   Emesis   Dysuria   HPI  55 year old female presents with the above complaints.  Patient reports that she developed nausea, vomiting, and diarrhea yesterday.  She has been able to tolerate fluids.  Patient is concerned that she has a "stomach bug".  No documented fever.  She reports some abdominal cramping.  Additionally, patient reports that last week she developed urinary symptoms.  She reports dysuria and urinary frequency.  She reports associated low back pain.  However, she has chronic low back pain.  She has been trying to remedy this at home but has been unsuccessful.  No relieving factors.  No other associated symptoms.  No other complaints.   Past Medical History:  Diagnosis Date   Arthritis    Psoriatic   Cardiomyopathy ([place])    CHF (congestive heart failure) ([place])    Hyperlipidemia    Hypertension    Psoriasis    Thyroid disease    Transient weakness of left leg 08/13/2019   Wears contact lenses     Patient Active Problem List   Diagnosis Date Noted   Encounter for screening colonoscopy    Gastric erythema    Nausea and vomiting    Transient weakness of left leg 08/13/2019   Recurrent major depressive disorder, in partial remission ([place]) 06/26/2019   Chronic, continuous use of opioids 02/19/2019   History of cardiomyopathy 08/21/2018   Shortness of breath 08/21/2018   Palpitations 08/21/2018   Chronic shoulder pain 06/14/2017   Encounter for long-term (current) use of other medications 05/24/2017   Left wrist pain 04/11/2017   Chronic sacroiliac joint pain 04/11/2017   Hyperlipidemia 03/15/2017   Elevated hemoglobin A1c 03/15/2017   Therapeutic opioid induced constipation 02/02/2017   Chronic pain syndrome 04/07/2016   Chronic hip pain 04/07/2016   Pain in multiple finger joints 04/07/2016   Allergic  rhinitis due to pollen 09/08/2015   Other long term (current) drug therapy 08/13/2015   Depression 08/06/2015   Opioid dependence, daily use ([place]) 08/06/2015   High risk medications (not anticoagulants) long-term use 02/11/2015   Dysmenorrhea 10/29/2013   Congestive heart failure ([place]) 10/29/2013   Menorrhagia 10/15/2013   Fibroid 10/10/2013   Hyperglycemia 08/22/2013   Hypertension 04/30/2013   Psoriasis 04/30/2013   Metrorrhagia 12/07/2011   Chronic low back pain 11/02/2011   GERD (gastroesophageal reflux disease) 07/12/2010   Hypothyroidism 07/12/2010   Psoriasis with arthropathy ([place]) 07/12/2010   Avitaminosis D 07/12/2010   Abnormal mammogram 07/12/2010   Breast screening 07/12/2010   Encounter for routine gynecological examination 07/12/2010   Insomnia 07/12/2010   Vitamin D deficiency 07/12/2010    Past Surgical History:  Procedure Laterality Date   COLONOSCOPY WITH PROPOFOL N/A 06/30/2020   Procedure: COLONOSCOPY WITH PROPOFOL;  Surgeon: Virgel Manifold, MD;  Location: [place];  Service: Endoscopy;  Laterality: N/A;   COLONOSCOPY WITH PROPOFOL N/A 07/20/2020   Procedure: COLONOSCOPY WITH PROPOFOL;  Surgeon: Virgel Manifold, MD;  Location: [place];  Service: Endoscopy;  Laterality: N/A;  poor prep   ENDOMETRIAL ABLATION  2015   ESOPHAGOGASTRODUODENOSCOPY (EGD) WITH PROPOFOL N/A 06/30/2020   Procedure: ESOPHAGOGASTRODUODENOSCOPY (EGD) WITH BIOPSY;  Surgeon: Virgel Manifold, MD;  Location: [place];  Service: Endoscopy;  Laterality: N/A;   THYROIDECTOMY     TONSILLECTOMY  TUBAL LIGATION      OB History     Gravida  5   Para  2   Term  2   Preterm      AB  3   Living  2      SAB  3   IAB      Ectopic      Multiple      Live Births  2            Home Medications    Prior to Admission medications   Medication Sig Start Date End Date Taking? Authorizing Provider  albuterol (VENTOLIN HFA)  108 (90 Base) MCG/ACT inhaler Inhale 2 puffs into the lungs every 6 (six) hours as needed for wheezing or shortness of breath. 09/04/20  Yes Laban Emperor, PA-C  Ascorbic Acid (VITAMIN C) 1000 MG tablet Take 1,000 mg by mouth daily.   Yes [provider]  carvedilol (COREG) 25 MG tablet Take 1 tablet (25 mg total) by mouth 2 (two) times daily. NO FURTHER REFILLS UNTIL SEEN IN CLINIC. MUST KEEP SCHEDULED APPOINTMENT. 03/11/21  Yes Gollan, Kathlene November, MD  cephALEXin (KEFLEX) 500 MG capsule Take 1 capsule (500 mg total) by mouth 2 (two) times daily. 03/17/21  Yes Coral Spikes, DO  Cholecalciferol 25 MCG (1000 UT) tablet Take by mouth daily.    Yes [provider]  estradiol (CLIMARA - DOSED IN MG/24 HR) 0.025 mg/24hr patch Place 1 patch (0.025 mg total) onto the skin once a week. 05/08/20  Yes Rod Can, CNM  fluticasone (FLONASE) 50 MCG/ACT nasal spray Place 2 sprays into both nostrils daily. 12/18/18  Yes Mikey College, NP  Ixekizumab 80 MG/ML SOSY Inject 80 mg into the skin as directed. [facility]   Yes [provider]  levothyroxine (SYNTHROID) 100 MCG tablet Take 1 tablet (100 mcg total) by mouth daily before breakfast. 10/06/20  Yes Gollan, Kathlene November, MD  lisinopril (ZESTRIL) 20 MG tablet TAKE 1 TABLET(20 MG) BY MOUTH DAILY 09/08/20  Yes Gollan, Kathlene November, MD  morphine (MS CONTIN) 30 MG 12 hr tablet Take 1 tablet (30 mg total) by mouth every 12 (twelve) hours. 03/23/21 04/22/21 Yes Molli Barrows, MD  Multiple Vitamin (MULTI VITAMIN PO) Take 1 tablet by mouth daily.   Yes [provider]  omeprazole (PRILOSEC) 40 MG capsule Take 1 capsule (40 mg total) by mouth daily. 07/20/20  Yes Virgel Manifold, MD  ondansetron (ZOFRAN ODT) 4 MG disintegrating tablet Take 1 tablet (4 mg total) by mouth every 8 (eight) hours as needed for nausea or vomiting. 03/17/21  Yes Chia Rock G, DO  oxyCODONE-acetaminophen (PERCOCET) 10-325 MG tablet Take 1 tablet by mouth 2  (two) times daily as needed for up to 18 days for pain. 03/05/21 03/23/21 Yes Molli Barrows, MD  promethazine (PHENERGAN) 12.5 MG tablet Take 1 tablet (12.5 mg total) by mouth every 6 (six) hours as needed for up to 30 doses for nausea or vomiting. 10/28/20  Yes Vonda Antigua B, MD  clobetasol (TEMOVATE) 0.05 % external solution Apply 1 application topically as needed.  03/22/17   [provider]  morphine (MS CONTIN) 30 MG 12 hr tablet Take 1 tablet (30 mg total) by mouth every 12 (twelve) hours. 04/22/21 05/22/21  Molli Barrows, MD  naloxone [facility]) nasal spray 4 mg/0.1 mL To reverse excess sedation from narcotics 12/28/17   Molli Barrows, MD  oxyCODONE-acetaminophen (PERCOCET) 10-325 MG tablet Take 1 tablet  by mouth 2 (two) times daily as needed for pain. 03/23/21 04/22/21  Molli Barrows, MD  oxyCODONE-acetaminophen (PERCOCET) 10-325 MG tablet Take 1 tablet by mouth in the morning and at bedtime. 04/23/21 05/23/21  Molli Barrows, MD  Sod Picosulfate-Mag Ox-Cit Acd (CLENPIQ) 10-3.5-12 MG-GM -GM/160ML SOLN Take 1 bottle at 5 PM followed by five 8 oz cups of water and repeat 5 hours before procedure. 07/01/20   Virgel Manifold, MD  spironolactone (ALDACTONE) 25 MG tablet TAKE 1 TABLET(25 MG) BY MOUTH DAILY 09/08/20   Minna Merritts, MD  pantoprazole (PROTONIX) 40 MG tablet Take 1 tablet (40 mg total) by mouth 2 (two) times daily. 07/01/20 07/20/20  Virgel Manifold, MD    Family History Family History  Problem Relation Age of Onset   Hypertension Mother    Heart disease Father        CABG    Hyperlipidemia Father    Hypertension Father    Diabetes Father     Social History Social History   Tobacco Use   Smoking status: Former    Packs/day: 0.25    Years: 15.00    Pack years: 3.75    Types: Cigarettes    Quit date: 2012    Years since quitting: 10.5   Smokeless tobacco: Former  Scientific laboratory technician Use: Former   Substances: Flavoring  Substance Use Topics    Alcohol use: Not Currently    Comment: social   Drug use: No     Allergies   Other, Sulfa antibiotics, and Sulfacetamide sodium   Review of Systems Review of Systems  Constitutional:  Negative for fever.  Gastrointestinal:  Positive for diarrhea, nausea and vomiting.  Genitourinary:  Positive for dysuria and frequency.    Physical Exam Triage Vital Signs ED Triage Vitals  Enc Vitals Group     BP 03/17/21 1543 115/61     Pulse Rate 03/17/21 1543 89     Resp 03/17/21 1543 18     Temp 03/17/21 1543 98.5 F (36.9 C)     Temp Source 03/17/21 1543 Oral     SpO2 03/17/21 1543 97 %     Weight 03/17/21 1544 162 lb 14.7 oz (73.9 kg)     Height 03/17/21 1544 5\' 10"  (1.778 m)     Head Circumference --      Peak Flow --      Pain Score 03/17/21 1544 0     Pain Loc --      Pain Edu? --      Excl. in [place]? --    No data found.  Updated Vital Signs BP 115/61 (BP Location: Left Arm)   Pulse 89   Temp 98.5 F (36.9 C) (Oral)   Resp 18   Ht 5\' 10"  (1.778 m)   Wt 73.9 kg   SpO2 97%   BMI 23.38 kg/m   Visual Acuity Right Eye Distance:   Left Eye Distance:   Bilateral Distance:    Right Eye Near:   Left Eye Near:    Bilateral Near:     Physical Exam Vitals and nursing note reviewed.  Constitutional:      General: She is not in acute distress. HENT:     Head: Normocephalic and atraumatic.  Eyes:     General:        Right eye: No discharge.        Left eye: No discharge.     Conjunctiva/sclera: Conjunctivae normal.  Cardiovascular:     Rate and Rhythm: Normal rate and regular rhythm.  Pulmonary:     Effort: Pulmonary effort is normal.     Breath sounds: Normal breath sounds. No wheezing, rhonchi or rales.  Abdominal:     General: There is no distension.     Palpations: Abdomen is soft.     Tenderness: There is no abdominal tenderness.  Neurological:     Mental Status: She is alert.  Psychiatric:        Mood and Affect: Mood normal.        Behavior: Behavior  normal.     UC Treatments / Results  Labs (all labs ordered are listed, but only abnormal results are displayed) Labs Reviewed  URINALYSIS, COMPLETE (UACMP) WITH MICROSCOPIC - Abnormal; Notable for the following components:      Result Value   APPearance HAZY (*)    Hgb urine dipstick SMALL (*)    Bacteria, UA MANY (*)    All other components within normal limits  URINE CULTURE    EKG   Radiology No results found.  Procedures Procedures (including critical care time)  Medications Ordered in UC Medications  ondansetron (ZOFRAN-ODT) disintegrating tablet 8 mg (8 mg Oral Given 03/17/21 1555)    Initial Impression / Assessment and Plan / UC Course  I have reviewed the triage vital signs and the nursing notes.  Pertinent labs & imaging results that were available during my care of the patient were reviewed by me and considered in my medical decision making (see chart for details).    55 year old female presents with viral gastroenteritis.  Treating with fluids, supportive care, and Zofran.  Additionally, patient having urinary symptoms and pyuria on urinalysis.  Sending culture.  Placing on Keflex.  Final Clinical Impressions(s) / UC Diagnoses   Final diagnoses:  Viral gastroenteritis  Acute cystitis with hematuria     Discharge Instructions      Hold your lisinopril for a couple of days.  Medication as prescribed.  Lots of fluids.  If you worsen, go to the ER.    ED Prescriptions     Medication Sig Dispense Auth. Provider   ondansetron (ZOFRAN ODT) 4 MG disintegrating tablet Take 1 tablet (4 mg total) by mouth every 8 (eight) hours as needed for nausea or vomiting. 20 tablet Viki Carrera G, DO   cephALEXin (KEFLEX) 500 MG capsule Take 1 capsule (500 mg total) by mouth 2 (two) times daily. 14 capsule Thersa Salt G, DO      PDMP not reviewed this encounter.   Coral Spikes, [place] 03/17/21 1730

## 2021-03-20 LAB — URINE CULTURE: Culture: >=100000 — AB

## 2021-03-22 NOTE — Telephone Encounter (Signed)
I am sorry promethazine 12.5 MG tablet

## 2021-03-23 ENCOUNTER — Other Ambulatory Visit: Payer: Self-pay | Admitting: Gastroenterology

## 2021-04-15 NOTE — Progress Notes (Deleted)
Cardiology Office Note:    Date:  04/16/2021   ID:  Krista Kennedy, DOB 1966/02/16, MRN [id number]  PCP:  Patient, No Pcp Per (Inactive)  [facility] Cardiologist:  Ida Rogue, MD  [place] Electrophysiologist:  None   Referring MD: No ref. provider found   Chief Complaint: f/u  History of Present Illness:    Krista Kennedy is a 55 y.o. female with a hx of peripartum CM in 2002 with prior EF 25% improved to 55% in 2014, HTN, hypothyroidism, prior tobacco use, chronic pain syndrome, GERD who presents for follow-up.   She has a history of peripartum cardiomyopathy in 2002 with associated lower extremity swelling and SOB. Notes indicate it took some time for her EF to improve. Most recent echo from 05/2013 showed 55%, no RWMA, Gr1DD, RVSF normal, PASP normal. She has previously noted she has not felt well since her original diagnosis in 2002. She was seen by Dr. Rockey Kennedy in 08/2018 for evaluation of her history of cardiomyopathy and was overall doing well, noting some palpitations. BP was 150/90. She was advised to increase lisinopril to 20 mg daily. With regards to her palpitations, there was mention of an extra Coreg or prn propranolol.   Last seen 11/17/20 and was doing well. BP well controlled. No changes were made  Today,     Past Medical History:  Diagnosis Date   Arthritis    Psoriatic   Cardiomyopathy ([place])    CHF (congestive heart failure) ([place])    Hyperlipidemia    Hypertension    Psoriasis    Thyroid disease    Transient weakness of left leg 08/13/2019   Wears contact lenses     Past Surgical History:  Procedure Laterality Date   COLONOSCOPY WITH PROPOFOL N/A 06/30/2020   Procedure: COLONOSCOPY WITH PROPOFOL;  Surgeon: Virgel Manifold, MD;  Location: [place];  Service: Endoscopy;  Laterality: N/A;   COLONOSCOPY WITH PROPOFOL N/A 07/20/2020   Procedure: COLONOSCOPY WITH PROPOFOL;  Surgeon: Virgel Manifold, MD;  Location: [place];  Service: Endoscopy;  Laterality: N/A;  poor prep   ENDOMETRIAL ABLATION  2015   ESOPHAGOGASTRODUODENOSCOPY (EGD) WITH PROPOFOL N/A 06/30/2020   Procedure: ESOPHAGOGASTRODUODENOSCOPY (EGD) WITH BIOPSY;  Surgeon: Virgel Manifold, MD;  Location: [place];  Service: Endoscopy;  Laterality: N/A;   THYROIDECTOMY     TONSILLECTOMY     TUBAL LIGATION      Current Medications: No outpatient medications have been marked as taking for the 04/16/21 encounter (Appointment) with Kathlen Mody, Rhonna Holster H, PA-C.     Allergies:   Other, Sulfa antibiotics, and Sulfacetamide sodium   Social History   Socioeconomic History   Marital status: Married    Spouse name: Not on file   Number of children: Not on file   Years of education: Not on file   Highest education level: Not on file  Occupational History   Not on file  Tobacco Use   Smoking status: Former    Packs/day: 0.25    Years: 15.00    Pack years: 3.75    Types: Cigarettes    Quit date: 2012    Years since quitting: 10.6   Smokeless tobacco: Former  Scientific laboratory technician Use: Former   Substances: Flavoring  Substance and Sexual Activity   Alcohol use: Not Currently    Comment: social   Drug use: No   Sexual activity: Yes    Birth control/protection: None  Other Topics Concern  Not on file  Social History Narrative   Not on file   Social Determinants of Health   Financial Resource Strain: Not on file  Food Insecurity: Not on file  Transportation Needs: Not on file  Physical Activity: Not on file  Stress: Not on file  Social Connections: Not on file     Family History: The patient's family history includes Diabetes in her father; Heart disease in her father; Hyperlipidemia in her father; Hypertension in her father and mother.  ROS:   Please see the history of present illness.     All other systems reviewed and are negative.  EKGs/Labs/Other Studies Reviewed:    The following studies were reviewed  today:  Echo 11/2019  1. Left ventricular ejection fraction, by estimation, is 40 to 45%. The  left ventricle has mildly decreased function. The left ventricle  demonstrates global hypokinesis. There is mild left ventricular  hypertrophy. Left ventricular diastolic parameters  are consistent with Grade I diastolic dysfunction (impaired relaxation).   2. Right ventricular systolic function is normal. The right ventricular  size is normal. There is normal pulmonary artery systolic pressure.   Comparison(s): EF 50-55%.   EKG:  EKG is *** ordered today.  The ekg ordered today demonstrates ***  Recent Labs: 05/06/2020: ALT 16; TSH 0.130 09/04/2020: BUN 24; Creatinine, Ser 1.29; Hemoglobin 13.4; Platelets 270; Potassium 3.9; Sodium 136  Recent Lipid Panel    Component Value Date/Time   CHOL 200 (H) 05/06/2020 1130   TRIG 122 05/06/2020 1130   HDL 60 05/06/2020 1130   CHOLHDL 4.7 07/20/2018 0959   VLDL 20 04/11/2017 1103   LDLCALC 118 (H) 05/06/2020 1130   LDLCALC 129 (H) 07/20/2018 0959     Risk Assessment/Calculations:   {Does this patient have ATRIAL FIBRILLATION?:[id number]}   Physical Exam:    VS:  There were no vitals taken for this visit.    Wt Readings from Last 3 Encounters:  03/17/21 162 lb 14.7 oz (73.9 kg)  07/20/20 163 lb (73.9 kg)  07/02/20 165 lb (74.8 kg)     GEN: *** Well nourished, well developed in no acute distress HEENT: Normal NECK: No JVD; No carotid bruits LYMPHATICS: No lymphadenopathy CARDIAC: ***RRR, no murmurs, rubs, gallops RESPIRATORY:  Clear to auscultation without rales, wheezing or rhonchi  ABDOMEN: Soft, non-tender, non-distended MUSCULOSKELETAL:  No edema; No deformity  SKIN: Warm and dry NEUROLOGIC:  Alert and oriented x 3 PSYCHIATRIC:  Normal affect   ASSESSMENT:    1. History of cardiomyopathy   2. Essential hypertension   3. Palpitations    PLAN:    In order of problems listed  above:  CM  HTN  SOB  Palpitations  Disposition: Follow up {follow up:15908} with ***   Shared Decision Making/Informed Consent   {Are you ordering a CV Procedure (e.g. stress test, cath, DCCV, TEE, etc)?   Press F2        :[id number]    Signed, Basilia Stuckert Ninfa Meeker, PA-C  04/16/2021 7:59 AM    [facility]

## 2021-04-16 ENCOUNTER — Ambulatory Visit: Payer: Commercial Managed Care - PPO | Admitting: Medical

## 2021-04-19 ENCOUNTER — Encounter: Payer: Self-pay | Admitting: Medical

## 2021-05-13 ENCOUNTER — Other Ambulatory Visit: Payer: Self-pay

## 2021-05-13 ENCOUNTER — Ambulatory Visit: Payer: Commercial Managed Care - PPO | Attending: Anesthesiology | Admitting: Anesthesiology

## 2021-05-13 ENCOUNTER — Encounter: Payer: Self-pay | Admitting: Anesthesiology

## 2021-05-13 DIAGNOSIS — M25512 Pain in left shoulder: Secondary | ICD-10-CM

## 2021-05-13 DIAGNOSIS — M25549 Pain in joints of unspecified hand: Secondary | ICD-10-CM

## 2021-05-13 DIAGNOSIS — F1190 Opioid use, unspecified, uncomplicated: Secondary | ICD-10-CM

## 2021-05-13 DIAGNOSIS — M5442 Lumbago with sciatica, left side: Secondary | ICD-10-CM | POA: Diagnosis not present

## 2021-05-13 DIAGNOSIS — L4050 Arthropathic psoriasis, unspecified: Secondary | ICD-10-CM

## 2021-05-13 DIAGNOSIS — G8929 Other chronic pain: Secondary | ICD-10-CM

## 2021-05-13 DIAGNOSIS — M25532 Pain in left wrist: Secondary | ICD-10-CM

## 2021-05-13 DIAGNOSIS — M25511 Pain in right shoulder: Secondary | ICD-10-CM | POA: Diagnosis not present

## 2021-05-13 DIAGNOSIS — M25552 Pain in left hip: Secondary | ICD-10-CM

## 2021-05-13 DIAGNOSIS — G894 Chronic pain syndrome: Secondary | ICD-10-CM

## 2021-05-13 DIAGNOSIS — M5441 Lumbago with sciatica, right side: Secondary | ICD-10-CM

## 2021-05-13 MED ORDER — OXYCODONE-ACETAMINOPHEN 10-325 MG PO TABS: 1.0000 | ORAL_TABLET | Freq: Four times a day (QID) | ORAL | 0 refills | Status: DC | PRN

## 2021-05-13 MED ORDER — OXYCODONE-ACETAMINOPHEN 10-325 MG PO TABS: 1.0000 | ORAL_TABLET | Freq: Four times a day (QID) | ORAL | 0 refills | Status: AC | PRN

## 2021-05-13 NOTE — Progress Notes (Signed)
Virtual Visit via Telephone Note  I connected with Krista Kennedy on 05/13/21 at  1:15 PM EDT by telephone and verified that I am speaking with the correct person using two identifiers.  Location: Patient: Home Provider: Pain control center   I discussed the limitations, risks, security and privacy concerns of performing an evaluation and management service by telephone and the availability of in person appointments. I also discussed with the patient that there may be a patient responsible charge related to this service. The patient expressed understanding and agreed to proceed.   History of Present Illness: I spoke with Krista Kennedy today via telephone as we were unable to link for the video portion of virtual conference.  She states that she is doing reasonably well with her hand pain and hip pain in addition to the low back pain.  She feels that she is done better over the winter months with the Percocet and this regimen has worked better for her than the morphine.  She like to switch back secondary to that.  She generally takes the Percocet 4 times a day and this combination gives her approximately 50 to 75% relief lasting about 4 to 6 hours before she has recurrence of her usual pain.  Overall, the quality characteristic and distribution pain to been stable in nature with no changes noted.  She continues to follow-up with her rheumatologist additionally.  Beyond that she states she is in her usual state of health.  Review of systems: General: No fevers or chills Pulmonary: No shortness of breath or dyspnea Cardiac: No angina or palpitations or lightheadedness GI: No abdominal pain or constipation Psych: No depression    Observations/Objective:  Current Outpatient Medications:    [START ON 06/24/2021] oxyCODONE-acetaminophen (PERCOCET) 10-325 MG tablet, Take 1 tablet by mouth every 6 (six) hours as needed for pain., Disp: 120 tablet, Rfl: 0   albuterol (VENTOLIN HFA) 108 (90 Base) MCG/ACT  inhaler, Inhale 2 puffs into the lungs every 6 (six) hours as needed for wheezing or shortness of breath., Disp: 1 each, Rfl: 0   Ascorbic Acid (VITAMIN C) 1000 MG tablet, Take 1,000 mg by mouth daily., Disp: , Rfl:    carvedilol (COREG) 25 MG tablet, Take 1 tablet (25 mg total) by mouth 2 (two) times daily. NO FURTHER REFILLS UNTIL SEEN IN CLINIC. MUST KEEP SCHEDULED APPOINTMENT., Disp: 180 tablet, Rfl: 0   cephALEXin (KEFLEX) 500 MG capsule, Take 1 capsule (500 mg total) by mouth 2 (two) times daily., Disp: 14 capsule, Rfl: 0   Cholecalciferol 25 MCG (1000 UT) tablet, Take by mouth daily. , Disp: , Rfl:    clobetasol (TEMOVATE) 0.05 % external solution, Apply 1 application topically as needed. , Disp: , Rfl:    estradiol (CLIMARA - DOSED IN MG/24 HR) 0.025 mg/24hr patch, Place 1 patch (0.025 mg total) onto the skin once a week., Disp: 4 patch, Rfl: 0   fluticasone (FLONASE) 50 MCG/ACT nasal spray, Place 2 sprays into both nostrils daily., Disp: 16 g, Rfl: 3   Ixekizumab 80 MG/ML SOSY, Inject 80 mg into the skin as directed. [facility], Disp: , Rfl:    levothyroxine (SYNTHROID) 100 MCG tablet, Take 1 tablet (100 mcg total) by mouth daily before breakfast., Disp: 90 tablet, Rfl: 0   lisinopril (ZESTRIL) 20 MG tablet, TAKE 1 TABLET(20 MG) BY MOUTH DAILY, Disp: 90 tablet, Rfl: 0   morphine (MS CONTIN) 30 MG 12 hr tablet, Take 1 tablet (30 mg total) by mouth every 12 (twelve)  hours., Disp: 60 tablet, Rfl: 0   Multiple Vitamin (MULTI VITAMIN PO), Take 1 tablet by mouth daily., Disp: , Rfl:    naloxone (NARCAN) nasal spray 4 mg/0.1 mL, To reverse excess sedation from narcotics, Disp: 2 kit, Rfl: 1   omeprazole (PRILOSEC) 40 MG capsule, Take 1 capsule (40 mg total) by mouth daily., Disp: 30 capsule, Rfl: 0   ondansetron (ZOFRAN ODT) 4 MG disintegrating tablet, Take 1 tablet (4 mg total) by mouth every 8 (eight) hours as needed for nausea or vomiting., Disp: 20 tablet, Rfl: 0   [START ON 05/26/2021]  oxyCODONE-acetaminophen (PERCOCET) 10-325 MG tablet, Take 1 tablet by mouth every 6 (six) hours as needed for pain., Disp: 120 tablet, Rfl: 0   promethazine (PHENERGAN) 12.5 MG tablet, TAKE 1 TABLET(12.5 MG) BY MOUTH EVERY 6 HOURS FOR UP TO 30 DOSES AS NEEDED FOR NAUSEA OR VOMITING, Disp: 30 tablet, Rfl: 0   Sod Picosulfate-Mag Ox-Cit Acd (CLENPIQ) 10-3.5-12 MG-GM -GM/160ML SOLN, Take 1 bottle at 5 PM followed by five 8 oz cups of water and repeat 5 hours before procedure., Disp: 320 mL, Rfl: 0   spironolactone (ALDACTONE) 25 MG tablet, TAKE 1 TABLET(25 MG) BY MOUTH DAILY, Disp: 30 tablet, Rfl: 0   Assessment and Plan: 1. Psoriasis with arthropathy ([place])   2. Chronic pain of both shoulders   3. Chronic bilateral low back pain with bilateral sciatica   4. Pain in multiple finger joints   5. Chronic pain syndrome   6. Chronic, continuous use of opioids   7. Chronic left hip pain   8. Left wrist pain   9. Psoriatic arthritis ([place])   Based on her discussion today and upon review of the [facility] practitioner database information going to refill her medicines for the next 2 months dated  for 05/2020 and 1020 .  No other changes in her pain management protocol will be initiated.  We will schedule her for return to clinic in 2 months with a routine annual urine drug screen pending.  I want her to continue with stretching strengthening exercises as reviewed and continue follow-up with her rheumatologist and primary care physician for baseline medical care.  Follow Up Instructions:    I discussed the assessment and treatment plan with the patient. The patient was provided an opportunity to ask questions and all were answered. The patient agreed with the plan and demonstrated an understanding of the instructions.   The patient was advised to call back or seek an in-person evaluation if the symptoms worsen or if the condition fails to improve as anticipated.  I provided 30 minutes of  non-face-to-face time during this encounter.   Molli Barrows, MD

## 2021-06-03 ENCOUNTER — Encounter: Payer: Self-pay | Admitting: Anesthesiology

## 2021-06-03 ENCOUNTER — Ambulatory Visit: Payer: Commercial Managed Care - PPO | Attending: Pain Medicine | Admitting: Anesthesiology

## 2021-06-03 ENCOUNTER — Other Ambulatory Visit: Payer: Self-pay

## 2021-06-03 DIAGNOSIS — M25549 Pain in joints of unspecified hand: Secondary | ICD-10-CM | POA: Diagnosis not present

## 2021-06-03 DIAGNOSIS — M5442 Lumbago with sciatica, left side: Secondary | ICD-10-CM

## 2021-06-03 DIAGNOSIS — M25552 Pain in left hip: Secondary | ICD-10-CM

## 2021-06-03 DIAGNOSIS — L4050 Arthropathic psoriasis, unspecified: Secondary | ICD-10-CM

## 2021-06-03 DIAGNOSIS — M25511 Pain in right shoulder: Secondary | ICD-10-CM | POA: Diagnosis not present

## 2021-06-03 DIAGNOSIS — M5441 Lumbago with sciatica, right side: Secondary | ICD-10-CM

## 2021-06-03 DIAGNOSIS — G8929 Other chronic pain: Secondary | ICD-10-CM

## 2021-06-03 DIAGNOSIS — M25512 Pain in left shoulder: Secondary | ICD-10-CM

## 2021-06-03 DIAGNOSIS — M25532 Pain in left wrist: Secondary | ICD-10-CM

## 2021-06-03 DIAGNOSIS — M533 Sacrococcygeal disorders, not elsewhere classified: Secondary | ICD-10-CM

## 2021-06-03 DIAGNOSIS — F1190 Opioid use, unspecified, uncomplicated: Secondary | ICD-10-CM

## 2021-06-03 DIAGNOSIS — G894 Chronic pain syndrome: Secondary | ICD-10-CM

## 2021-06-03 MED ORDER — OXYCODONE-ACETAMINOPHEN 10-325 MG PO TABS: 1.0000 | ORAL_TABLET | Freq: Four times a day (QID) | ORAL | 0 refills | Status: DC | PRN

## 2021-06-03 NOTE — Progress Notes (Signed)
Virtual Visit via Telephone Note  I connected with Krista Kennedy on 06/03/21 at  1:40 PM EDT by telephone and verified that I am speaking with the correct person using two identifiers.  Location: Patient: Home Provider: Pain control center   I discussed the limitations, risks, security and privacy concerns of performing an evaluation and management service by telephone and the availability of in person appointments. I also discussed with the patient that there may be a patient responsible charge related to this service. The patient expressed understanding and agreed to proceed.   History of Present Illness: I spoke with Krista Kennedy via telephone for her appointment today.  We were unable to link for the video portion of this and she reports that she is doing well with her pain control.  She continues to have generalized rheumatoid arthritic symptoms worse in the left wrist hip and occasionally knees and low back.  No changes are reported and the quality characteristic distribution of her pain symptoms.  She is taking her medications as prescribed and these continue to work well for her and give her good relief.  She denies any diverting or illicit use of her medications and furthermore she denies side effects with the medicines.  They enable her to continue being active and work full-time.  Otherwise she is in her usual state of health.  Review of systems: General: No fevers or chills Pulmonary: No shortness of breath or dyspnea Cardiac: No angina or palpitations or lightheadedness GI: No abdominal pain or constipation Psych: No depression    Observations/Objective:  Current Outpatient Medications:    albuterol (VENTOLIN HFA) 108 (90 Base) MCG/ACT inhaler, Inhale 2 puffs into the lungs every 6 (six) hours as needed for wheezing or shortness of breath., Disp: 1 each, Rfl: 0   Ascorbic Acid (VITAMIN C) 1000 MG tablet, Take 1,000 mg by mouth daily., Disp: , Rfl:    carvedilol (COREG) 25 MG tablet,  Take 1 tablet (25 mg total) by mouth 2 (two) times daily. NO FURTHER REFILLS UNTIL SEEN IN CLINIC. MUST KEEP SCHEDULED APPOINTMENT., Disp: 180 tablet, Rfl: 0   cephALEXin (KEFLEX) 500 MG capsule, Take 1 capsule (500 mg total) by mouth 2 (two) times daily., Disp: 14 capsule, Rfl: 0   Cholecalciferol 25 MCG (1000 UT) tablet, Take by mouth daily. , Disp: , Rfl:    clobetasol (TEMOVATE) 0.05 % external solution, Apply 1 application topically as needed. , Disp: , Rfl:    estradiol (CLIMARA - DOSED IN MG/24 HR) 0.025 mg/24hr patch, Place 1 patch (0.025 mg total) onto the skin once a week., Disp: 4 patch, Rfl: 0   fluticasone (FLONASE) 50 MCG/ACT nasal spray, Place 2 sprays into both nostrils daily., Disp: 16 g, Rfl: 3   Ixekizumab 80 MG/ML SOSY, Inject 80 mg into the skin as directed. [facility], Disp: , Rfl:    levothyroxine (SYNTHROID) 100 MCG tablet, Take 1 tablet (100 mcg total) by mouth daily before breakfast., Disp: 90 tablet, Rfl: 0   lisinopril (ZESTRIL) 20 MG tablet, TAKE 1 TABLET(20 MG) BY MOUTH DAILY, Disp: 90 tablet, Rfl: 0   Multiple Vitamin (MULTI VITAMIN PO), Take 1 tablet by mouth daily., Disp: , Rfl:    naloxone (NARCAN) nasal spray 4 mg/0.1 mL, To reverse excess sedation from narcotics, Disp: 2 kit, Rfl: 1   omeprazole (PRILOSEC) 40 MG capsule, Take 1 capsule (40 mg total) by mouth daily., Disp: 30 capsule, Rfl: 0   ondansetron (ZOFRAN ODT) 4 MG disintegrating tablet, Take 1  tablet (4 mg total) by mouth every 8 (eight) hours as needed for nausea or vomiting., Disp: 20 tablet, Rfl: 0   [START ON 06/24/2021] oxyCODONE-acetaminophen (PERCOCET) 10-325 MG tablet, Take 1 tablet by mouth every 6 (six) hours as needed for pain., Disp: 120 tablet, Rfl: 0   [START ON 07/24/2021] oxyCODONE-acetaminophen (PERCOCET) 10-325 MG tablet, Take 1 tablet by mouth every 6 (six) hours as needed for pain., Disp: 120 tablet, Rfl: 0   promethazine (PHENERGAN) 12.5 MG tablet, TAKE 1 TABLET(12.5 MG) BY MOUTH  EVERY 6 HOURS FOR UP TO 30 DOSES AS NEEDED FOR NAUSEA OR VOMITING, Disp: 30 tablet, Rfl: 0   Sod Picosulfate-Mag Ox-Cit Acd (CLENPIQ) 10-3.5-12 MG-GM -GM/160ML SOLN, Take 1 bottle at 5 PM followed by five 8 oz cups of water and repeat 5 hours before procedure., Disp: 320 mL, Rfl: 0   spironolactone (ALDACTONE) 25 MG tablet, TAKE 1 TABLET(25 MG) BY MOUTH DAILY, Disp: 30 tablet, Rfl: 0   Assessment and Plan: 1. Psoriasis with arthropathy ([place])   2. Chronic pain of both shoulders   3. Chronic bilateral low back pain with bilateral sciatica   4. Pain in multiple finger joints   5. Chronic pain syndrome   6. Chronic, continuous use of opioids   7. Chronic left hip pain   8. Left wrist pain   9. Psoriatic arthritis ([place])   10. Chronic sacroiliac joint pain    Based on our discussion today and upon review of the [facility] practitioner database information were to refill her medications for the next 2 months.  She currently has an outstanding October 20 prescription and we will give her a prescription for November 19 today.  I have encouraged her to continue with stretching strengthening exercises and continue follow-up with her primary care physicians and rheumatologist.  We will schedule her for a 10-week return and we requested a urine screen in the meantime.  Follow Up Instructions:    I discussed the assessment and treatment plan with the patient. The patient was provided an opportunity to ask questions and all were answered. The patient agreed with the plan and demonstrated an understanding of the instructions.   The patient was advised to call back or seek an in-person evaluation if the symptoms worsen or if the condition fails to improve as anticipated.  I provided 20 minutes of non-face-to-face time during this encounter.   Molli Barrows, MD

## 2021-06-04 ENCOUNTER — Telehealth: Payer: Self-pay | Admitting: Anesthesiology

## 2021-06-07 NOTE — Telephone Encounter (Signed)
I sent My Chart Msg and sent AVS to Krista Kennedy.

## 2021-06-19 ENCOUNTER — Encounter: Payer: Self-pay | Admitting: Intensive Care

## 2021-06-19 ENCOUNTER — Other Ambulatory Visit: Payer: Self-pay

## 2021-06-19 ENCOUNTER — Emergency Department
Admission: EM | Admit: 2021-06-19 | Discharge: 2021-06-19 | Disposition: A | Discharge status: Home / Self Care | Payer: Commercial Managed Care - PPO | Attending: Student in an Organized Health Care Education/Training Program | Admitting: Student in an Organized Health Care Education/Training Program

## 2021-06-19 DIAGNOSIS — E039 Hypothyroidism, unspecified: Secondary | ICD-10-CM | POA: Diagnosis not present

## 2021-06-19 DIAGNOSIS — J029 Acute pharyngitis, unspecified: Secondary | ICD-10-CM | POA: Diagnosis present

## 2021-06-19 DIAGNOSIS — B349 Viral infection, unspecified: Secondary | ICD-10-CM

## 2021-06-19 DIAGNOSIS — I509 Heart failure, unspecified: Secondary | ICD-10-CM | POA: Diagnosis not present

## 2021-06-19 DIAGNOSIS — I110 Hypertensive heart disease with heart failure: Secondary | ICD-10-CM | POA: Diagnosis not present

## 2021-06-19 DIAGNOSIS — Z79899 Other long term (current) drug therapy: Secondary | ICD-10-CM | POA: Insufficient documentation

## 2021-06-19 DIAGNOSIS — Z20822 Contact with and (suspected) exposure to covid-19: Secondary | ICD-10-CM | POA: Insufficient documentation

## 2021-06-19 DIAGNOSIS — Z87891 Personal history of nicotine dependence: Secondary | ICD-10-CM | POA: Insufficient documentation

## 2021-06-19 LAB — RESP PANEL BY RT-PCR (FLU A&B, COVID) ARPGX2
Influenza A by PCR: NEGATIVE
Influenza B by PCR: NEGATIVE
SARS Coronavirus 2 by RT PCR: NEGATIVE

## 2021-06-19 MED ORDER — BENZONATATE 100 MG PO CAPS: 100.0000 mg | ORAL_CAPSULE | Freq: Three times a day (TID) | ORAL | 0 refills | Status: DC | PRN

## 2021-06-19 MED ORDER — PSEUDOEPH-BROMPHEN-DM 30-2-10 MG/5ML PO SYRP: 10.0000 mL | ORAL_SOLUTION | Freq: Four times a day (QID) | ORAL | 0 refills | Status: DC | PRN

## 2021-06-19 MED ORDER — ONDANSETRON 4 MG PO TBDP: 4.0000 mg | ORAL_TABLET | Freq: Three times a day (TID) | ORAL | 0 refills | Status: DC | PRN

## 2021-06-19 NOTE — ED Triage Notes (Signed)
Patient c/o sore throat, chills, headache, and body aches that started yesterday

## 2021-06-19 NOTE — ED Provider Notes (Signed)
[facility] Emergency Department Provider Note  ____________________________________________  Time seen: Approximately 1:00 PM  I have reviewed the triage vital signs and the nursing notes.   HISTORY  Chief Complaint Sore Throat and Chills    HPI Krista Kennedy is a 55 y.o. female who presents the emergency department complaining of body aches, headache, congestion, sore throat, nausea.  Symptoms all began suddenly yesterday.  No difficulty breathing or chest pain.  No abdominal pain.  Patient is nauseated but having no emesis or diarrhea.  No urinary symptoms.  No known sick contacts.  Patient felt feverish yesterday but did not check her temperature.  She has had some chills.       Past Medical History:  Diagnosis Date   Arthritis    Psoriatic   Cardiomyopathy ([place])    CHF (congestive heart failure) ([place])    Hyperlipidemia    Hypertension    Psoriasis    Thyroid disease    Transient weakness of left leg 08/13/2019   Wears contact lenses     Patient Active Problem List   Diagnosis Date Noted   Encounter for screening colonoscopy    Gastric erythema    Nausea and vomiting    Transient weakness of left leg 08/13/2019   Recurrent major depressive disorder, in partial remission ([place]) 06/26/2019   Chronic, continuous use of opioids 02/19/2019   History of cardiomyopathy 08/21/2018   Shortness of breath 08/21/2018   Palpitations 08/21/2018   Chronic shoulder pain 06/14/2017   Encounter for long-term (current) use of other medications 05/24/2017   Left wrist pain 04/11/2017   Chronic sacroiliac joint pain 04/11/2017   Hyperlipidemia 03/15/2017   Elevated hemoglobin A1c 03/15/2017   Therapeutic opioid induced constipation 02/02/2017   Chronic pain syndrome 04/07/2016   Chronic hip pain 04/07/2016   Pain in multiple finger joints 04/07/2016   Allergic rhinitis due to pollen 09/08/2015   Other long term (current) drug therapy 08/13/2015    Depression 08/06/2015   Opioid dependence, daily use ([place]) 08/06/2015   High risk medications (not anticoagulants) long-term use 02/11/2015   Dysmenorrhea 10/29/2013   Congestive heart failure ([place]) 10/29/2013   Menorrhagia 10/15/2013   Fibroid 10/10/2013   Hyperglycemia 08/22/2013   Hypertension 04/30/2013   Psoriasis 04/30/2013   Metrorrhagia 12/07/2011   Chronic low back pain 11/02/2011   GERD (gastroesophageal reflux disease) 07/12/2010   Hypothyroidism 07/12/2010   Psoriasis with arthropathy ([place]) 07/12/2010   Avitaminosis D 07/12/2010   Abnormal mammogram 07/12/2010   Breast screening 07/12/2010   Encounter for routine gynecological examination 07/12/2010   Insomnia 07/12/2010   Vitamin D deficiency 07/12/2010    Past Surgical History:  Procedure Laterality Date   COLONOSCOPY WITH PROPOFOL N/A 06/30/2020   Procedure: COLONOSCOPY WITH PROPOFOL;  Surgeon: Virgel Manifold, MD;  Location: [facility];  Service: Endoscopy;  Laterality: N/A;   COLONOSCOPY WITH PROPOFOL N/A 07/20/2020   Procedure: COLONOSCOPY WITH PROPOFOL;  Surgeon: Virgel Manifold, MD;  Location: [facility];  Service: Endoscopy;  Laterality: N/A;  poor prep   ENDOMETRIAL ABLATION  2015   ESOPHAGOGASTRODUODENOSCOPY (EGD) WITH PROPOFOL N/A 06/30/2020   Procedure: ESOPHAGOGASTRODUODENOSCOPY (EGD) WITH BIOPSY;  Surgeon: Virgel Manifold, MD;  Location: [facility];  Service: Endoscopy;  Laterality: N/A;   THYROIDECTOMY     TONSILLECTOMY     TUBAL LIGATION      Prior to Admission medications   Medication Sig Start Date End Date Taking? Authorizing Provider  benzonatate (TESSALON  PERLES) 100 MG capsule Take 1 capsule (100 mg total) by mouth 3 (three) times daily as needed for cough. 06/19/21 06/19/22 Yes English Tomer, Charline Bills, PA-C  brompheniramine-pseudoephedrine-DM 30-2-10 MG/5ML syrup Take 10 mLs by mouth 4 (four) times daily as needed. 06/19/21  Yes Sophie Quiles, Charline Bills,  PA-C  ondansetron (ZOFRAN-ODT) 4 MG disintegrating tablet Take 1 tablet (4 mg total) by mouth every 8 (eight) hours as needed for nausea or vomiting. 06/19/21  Yes Latonia Conrow, Charline Bills, PA-C  albuterol (VENTOLIN HFA) 108 (90 Base) MCG/ACT inhaler Inhale 2 puffs into the lungs every 6 (six) hours as needed for wheezing or shortness of breath. 09/04/20   Laban Emperor, PA-C  Ascorbic Acid (VITAMIN C) 1000 MG tablet Take 1,000 mg by mouth daily.    [provider]  carvedilol (COREG) 25 MG tablet Take 1 tablet (25 mg total) by mouth 2 (two) times daily. NO FURTHER REFILLS UNTIL SEEN IN CLINIC. MUST KEEP SCHEDULED APPOINTMENT. 03/11/21   Minna Merritts, MD  cephALEXin (KEFLEX) 500 MG capsule Take 1 capsule (500 mg total) by mouth 2 (two) times daily. 03/17/21   Coral Spikes, DO  Cholecalciferol 25 MCG (1000 UT) tablet Take by mouth daily.     [provider]  clobetasol (TEMOVATE) 0.05 % external solution Apply 1 application topically as needed.  03/22/17   [provider]  estradiol (CLIMARA - DOSED IN MG/24 HR) 0.025 mg/24hr patch Place 1 patch (0.025 mg total) onto the skin once a week. 05/08/20   Rod Can, CNM  fluticasone (FLONASE) 50 MCG/ACT nasal spray Place 2 sprays into both nostrils daily. 12/18/18   Mikey College, NP  Ixekizumab 80 MG/ML SOSY Inject 80 mg into the skin as directed. [facility]    [provider]  levothyroxine (SYNTHROID) 100 MCG tablet Take 1 tablet (100 mcg total) by mouth daily before breakfast. 10/06/20   Rockey Situ, Kathlene November, MD  lisinopril (ZESTRIL) 20 MG tablet TAKE 1 TABLET(20 MG) BY MOUTH DAILY 09/08/20   Minna Merritts, MD  Multiple Vitamin (MULTI VITAMIN PO) Take 1 tablet by mouth daily.    [provider]  naloxone [facility]) nasal spray 4 mg/0.1 mL To reverse excess sedation from narcotics 12/28/17   Molli Barrows, MD  omeprazole (PRILOSEC) 40 MG capsule Take 1 capsule (40 mg total) by mouth daily. 07/20/20    Virgel Manifold, MD  oxyCODONE-acetaminophen (PERCOCET) 10-325 MG tablet Take 1 tablet by mouth every 6 (six) hours as needed for pain. 06/24/21 07/24/21  Molli Barrows, MD  oxyCODONE-acetaminophen (PERCOCET) 10-325 MG tablet Take 1 tablet by mouth every 6 (six) hours as needed for pain. 07/24/21 08/23/21  Molli Barrows, MD  promethazine (PHENERGAN) 12.5 MG tablet TAKE 1 TABLET(12.5 MG) BY MOUTH EVERY 6 HOURS FOR UP TO 30 DOSES AS NEEDED FOR NAUSEA OR VOMITING 03/23/21   Virgel Manifold, MD  Sod Picosulfate-Mag Ox-Cit Acd (CLENPIQ) 10-3.5-12 MG-GM -GM/160ML SOLN Take 1 bottle at 5 PM followed by five 8 oz cups of water and repeat 5 hours before procedure. 07/01/20   Virgel Manifold, MD  spironolactone (ALDACTONE) 25 MG tablet TAKE 1 TABLET(25 MG) BY MOUTH DAILY 09/08/20   Minna Merritts, MD  pantoprazole (PROTONIX) 40 MG tablet Take 1 tablet (40 mg total) by mouth 2 (two) times daily. 07/01/20 07/20/20  Virgel Manifold, MD    Allergies Other, Sulfa antibiotics, and Sulfacetamide sodium  Family History  Problem Relation Age of Onset  Hypertension Mother    Heart disease Father        CABG    Hyperlipidemia Father    Hypertension Father    Diabetes Father     Social History Social History   Tobacco Use   Smoking status: Former    Packs/day: 0.25    Years: 15.00    Pack years: 3.75    Types: Cigarettes    Quit date: 2012    Years since quitting: 10.7   Smokeless tobacco: Former  Scientific laboratory technician Use: Former   Substances: Flavoring  Substance Use Topics   Alcohol use: Not Currently    Comment: social   Drug use: No     Review of Systems  Constitutional: Subjective fever/chills Eyes: No visual changes. No discharge ENT: Positive for congestion and sore throat Cardiovascular: no chest pain. Respiratory: no cough. No SOB. Gastrointestinal: No abdominal pain.  Positive nausea, no vomiting.  No diarrhea.  No constipation. Genitourinary: Negative for  dysuria. No hematuria Musculoskeletal: Negative for musculoskeletal pain. Skin: Negative for rash, abrasions, lacerations, ecchymosis. Neurological: Negative for headaches, focal weakness or numbness.  10 System ROS otherwise negative.  ____________________________________________   PHYSICAL EXAM:  VITAL SIGNS: ED Triage Vitals [06/19/21 1249]  Enc Vitals Group     BP 126/66     Pulse Rate 85     Resp 16     Temp 98.3 F (36.8 C)     Temp Source Oral     SpO2 96 %     Weight 165 lb (74.8 kg)     Height 5\' 10"  (1.778 m)     Head Circumference      Peak Flow      Pain Score 6     Pain Loc      Pain Edu?      Excl. in [place]?      Constitutional: Alert and oriented. Well appearing and in no acute distress. Eyes: Conjunctivae are normal. PERRL. EOMI. Head: Atraumatic. ENT:      Ears: EACs unremarkable bilaterally.  TMs are mildly erythematous and mildly bulging bilaterally.      Nose: Mild clear congestion/rhinnorhea.      Mouth/Throat: Mucous membranes are moist.  Oropharynx is mildly erythematous but nonedematous.  Uvula is midline no exudates. Neck: No stridor.  Neck is supple full range of motion Hematological/Lymphatic/Immunilogical: Scattered nontender anterior cervical lymphadenopathy. Cardiovascular: Normal rate, regular rhythm. Normal S1 and S2.  Good peripheral circulation. Respiratory: Normal respiratory effort without tachypnea or retractions. Lungs CTAB. Good air entry to the bases with no decreased or absent breath sounds. Gastrointestinal: Bowel sounds 4 quadrants. Soft and nontender to palpation. No guarding or rigidity. No palpable masses. No distention. No CVA tenderness. Musculoskeletal: Full range of motion to all extremities. No gross deformities appreciated. Neurologic:  Normal speech and language. No gross focal neurologic deficits are appreciated.  Skin:  Skin is warm, dry and intact. No rash noted. Psychiatric: Mood and affect are normal. Speech and  behavior are normal. Patient exhibits appropriate insight and judgement.   ____________________________________________   LABS (all labs ordered are listed, but only abnormal results are displayed)  Labs Reviewed  RESP PANEL BY RT-PCR (FLU A&B, COVID) [id number]   ____________________________________________  EKG   ____________________________________________  RADIOLOGY   No results found.  ____________________________________________    PROCEDURES  Procedure(s) performed:    Procedures    Medications - No data to display   ____________________________________________   INITIAL IMPRESSION / ASSESSMENT  AND PLAN / ED COURSE  Pertinent labs & imaging results that were available during my care of the patient were reviewed by me and considered in my medical decision making (see chart for details).  Review of the [place] CSRS was performed in accordance of the [place] prior to dispensing any controlled drugs.           Patient's diagnosis is consistent with viral illness.  Patient presents the emergency department with sudden onset of subjective fever, chills, body aches, headache, nasal congestion, sore throat, nausea.  Findings are consistent with viral illness.  Patient will have COVID and flu test at this time.  Medications for symptom control will be prescribed for the patient.  Return precautions discussed with the patient.  Otherwise follow-up primary care..  Patient is given ED precautions to return to the ED for any worsening or new symptoms.     ____________________________________________  FINAL CLINICAL IMPRESSION(S) / ED DIAGNOSES  Final diagnoses:  Viral illness      NEW MEDICATIONS STARTED DURING THIS VISIT:  ED Discharge Orders          Ordered    ondansetron (ZOFRAN-ODT) 4 MG disintegrating tablet  Every 8 hours PRN        06/19/21 1315    brompheniramine-pseudoephedrine-DM 30-2-10 MG/5ML syrup  4 times daily PRN        06/19/21 1315     benzonatate (TESSALON PERLES) 100 MG capsule  3 times daily PRN        06/19/21 1315                This chart was dictated using voice recognition software/Dragon. Despite best efforts to proofread, errors can occur which can change the meaning. Any change was purely unintentional.    Darletta Moll, PA-C 06/19/21 1316    Merlyn Lot, MD 06/19/21 1958

## 2021-07-28 LAB — COLOGUARD: COLOGUARD: NEGATIVE

## 2021-08-02 ENCOUNTER — Encounter: Payer: Self-pay | Admitting: Anesthesiology

## 2021-08-02 ENCOUNTER — Ambulatory Visit: Payer: Commercial Managed Care - PPO | Attending: Anesthesiology | Admitting: Anesthesiology

## 2021-08-02 ENCOUNTER — Other Ambulatory Visit: Payer: Self-pay

## 2021-08-02 DIAGNOSIS — M5136 Other intervertebral disc degeneration, lumbar region: Secondary | ICD-10-CM

## 2021-08-02 DIAGNOSIS — M5442 Lumbago with sciatica, left side: Secondary | ICD-10-CM | POA: Diagnosis not present

## 2021-08-02 DIAGNOSIS — L4050 Arthropathic psoriasis, unspecified: Secondary | ICD-10-CM

## 2021-08-02 DIAGNOSIS — M25552 Pain in left hip: Secondary | ICD-10-CM

## 2021-08-02 DIAGNOSIS — M5432 Sciatica, left side: Secondary | ICD-10-CM

## 2021-08-02 DIAGNOSIS — R29898 Other symptoms and signs involving the musculoskeletal system: Secondary | ICD-10-CM

## 2021-08-02 DIAGNOSIS — M25511 Pain in right shoulder: Secondary | ICD-10-CM | POA: Diagnosis not present

## 2021-08-02 DIAGNOSIS — M25532 Pain in left wrist: Secondary | ICD-10-CM

## 2021-08-02 DIAGNOSIS — G894 Chronic pain syndrome: Secondary | ICD-10-CM

## 2021-08-02 DIAGNOSIS — F1190 Opioid use, unspecified, uncomplicated: Secondary | ICD-10-CM

## 2021-08-02 DIAGNOSIS — M51369 Other intervertebral disc degeneration, lumbar region without mention of lumbar back pain or lower extremity pain: Secondary | ICD-10-CM

## 2021-08-02 DIAGNOSIS — M5441 Lumbago with sciatica, right side: Secondary | ICD-10-CM

## 2021-08-02 DIAGNOSIS — M533 Sacrococcygeal disorders, not elsewhere classified: Secondary | ICD-10-CM

## 2021-08-02 DIAGNOSIS — G8929 Other chronic pain: Secondary | ICD-10-CM

## 2021-08-02 DIAGNOSIS — M25512 Pain in left shoulder: Secondary | ICD-10-CM

## 2021-08-02 DIAGNOSIS — M25549 Pain in joints of unspecified hand: Secondary | ICD-10-CM | POA: Diagnosis not present

## 2021-08-02 HISTORY — DX: Other intervertebral disc degeneration, lumbar region without mention of lumbar back pain or lower extremity pain: M51.369

## 2021-08-02 HISTORY — DX: Sciatica, left side: M54.32

## 2021-08-02 HISTORY — DX: Other intervertebral disc degeneration, lumbar region: M51.36

## 2021-08-02 MED ORDER — OXYCODONE-ACETAMINOPHEN 10-325 MG PO TABS: 1.0000 | ORAL_TABLET | Freq: Four times a day (QID) | ORAL | 0 refills | Status: DC | PRN

## 2021-08-02 MED ORDER — OXYCODONE-ACETAMINOPHEN 10-325 MG PO TABS: 1.0000 | ORAL_TABLET | Freq: Four times a day (QID) | ORAL | 0 refills | Status: AC | PRN

## 2021-08-02 NOTE — Progress Notes (Signed)
Virtual Visit via Telephone Note  I connected with Krista Kennedy on 08/02/21 at  1:40 PM EST by telephone and verified that I am speaking with the correct person using two identifiers.  Location: Patient: Home Provider: Pain control center   I discussed the limitations, risks, security and privacy concerns of performing an evaluation and management service by telephone and the availability of in person appointments. I also discussed with the patient that there may be a patient responsible charge related to this service. The patient expressed understanding and agreed to proceed.   History of Present Illness: I spoke with Krista Kennedy via telephone today as we were unable like for the video portion of this conference.  She states that she has been suffering a great deal recently with some persistent bilateral low back pain that is currently radiating into her left calf greater than right calf.  This is been incapacitating to the point where she is not sleeping at night and wakes up in the morning and terrific pain.  Despite her opioid medications that have been historically effective, the pain is restrictive and limits her ability to be active during the day.  She has been doing her stretching strengthening exercises as tolerated with limited to no success.  Nothing seems to work at this point.  She has used heat and ice with no benefit and maintains that she is quite miserable.  She is not experiencing any lower extremity weakness or problems with bowel or bladder but this has been a long ongoing problem present for several years.  In the distant past she has had an MRI but this is unavailable for review.  She cannot recall where or when it was done.  She maintains that its been greater than 10 years.  She is taking her medications and these are generally managing her diffuse arthritic pain and these have generally given her 50 to 70% relief of these diffuse body pains but the lower extremity pain has been a  primary pain driver for her.  Otherwise she is in her usual state of health.  Review of systems: General: No fevers or chills Pulmonary: No shortness of breath or dyspnea Cardiac: No angina or palpitations or lightheadedness GI: No abdominal pain or constipation Psych: No depression    Observations/Objective:  Current Outpatient Medications:    [START ON 09/23/2021] oxyCODONE-acetaminophen (PERCOCET) 10-325 MG tablet, Take 1 tablet by mouth every 6 (six) hours as needed for pain., Disp: 120 tablet, Rfl: 0   albuterol (VENTOLIN HFA) 108 (90 Base) MCG/ACT inhaler, Inhale 2 puffs into the lungs every 6 (six) hours as needed for wheezing or shortness of breath., Disp: 1 each, Rfl: 0   Ascorbic Acid (VITAMIN C) 1000 MG tablet, Take 1,000 mg by mouth daily., Disp: , Rfl:    benzonatate (TESSALON PERLES) 100 MG capsule, Take 1 capsule (100 mg total) by mouth 3 (three) times daily as needed for cough., Disp: 30 capsule, Rfl: 0   brompheniramine-pseudoephedrine-DM 30-2-10 MG/5ML syrup, Take 10 mLs by mouth 4 (four) times daily as needed., Disp: 200 mL, Rfl: 0   carvedilol (COREG) 25 MG tablet, Take 1 tablet (25 mg total) by mouth 2 (two) times daily. NO FURTHER REFILLS UNTIL SEEN IN CLINIC. MUST KEEP SCHEDULED APPOINTMENT., Disp: 180 tablet, Rfl: 0   cephALEXin (KEFLEX) 500 MG capsule, Take 1 capsule (500 mg total) by mouth 2 (two) times daily., Disp: 14 capsule, Rfl: 0   Cholecalciferol 25 MCG (1000 UT) tablet, Take by mouth daily. ,  Disp: , Rfl:    clobetasol (TEMOVATE) 0.05 % external solution, Apply 1 application topically as needed. , Disp: , Rfl:    estradiol (CLIMARA - DOSED IN MG/24 HR) 0.025 mg/24hr patch, Place 1 patch (0.025 mg total) onto the skin once a week., Disp: 4 patch, Rfl: 0   fluticasone (FLONASE) 50 MCG/ACT nasal spray, Place 2 sprays into both nostrils daily., Disp: 16 g, Rfl: 3   Ixekizumab 80 MG/ML SOSY, Inject 80 mg into the skin as directed. [facility], Disp: , Rfl:     levothyroxine (SYNTHROID) 100 MCG tablet, Take 1 tablet (100 mcg total) by mouth daily before breakfast., Disp: 90 tablet, Rfl: 0   lisinopril (ZESTRIL) 20 MG tablet, TAKE 1 TABLET(20 MG) BY MOUTH DAILY, Disp: 90 tablet, Rfl: 0   Multiple Vitamin (MULTI VITAMIN PO), Take 1 tablet by mouth daily., Disp: , Rfl:    naloxone (NARCAN) nasal spray 4 mg/0.1 mL, To reverse excess sedation from narcotics, Disp: 2 kit, Rfl: 1   omeprazole (PRILOSEC) 40 MG capsule, Take 1 capsule (40 mg total) by mouth daily., Disp: 30 capsule, Rfl: 0   ondansetron (ZOFRAN-ODT) 4 MG disintegrating tablet, Take 1 tablet (4 mg total) by mouth every 8 (eight) hours as needed for nausea or vomiting., Disp: 20 tablet, Rfl: 0   [START ON 08/24/2021] oxyCODONE-acetaminophen (PERCOCET) 10-325 MG tablet, Take 1 tablet by mouth every 6 (six) hours as needed for pain., Disp: 120 tablet, Rfl: 0   promethazine (PHENERGAN) 12.5 MG tablet, TAKE 1 TABLET(12.5 MG) BY MOUTH EVERY 6 HOURS FOR UP TO 30 DOSES AS NEEDED FOR NAUSEA OR VOMITING, Disp: 30 tablet, Rfl: 0   Sod Picosulfate-Mag Ox-Cit Acd (CLENPIQ) 10-3.5-12 MG-GM -GM/160ML SOLN, Take 1 bottle at 5 PM followed by five 8 oz cups of water and repeat 5 hours before procedure., Disp: 320 mL, Rfl: 0   spironolactone (ALDACTONE) 25 MG tablet, TAKE 1 TABLET(25 MG) BY MOUTH DAILY, Disp: 30 tablet, Rfl: 0   Assessment and Plan: 1. Psoriasis with arthropathy ([place])   2. Chronic pain of both shoulders   3. Chronic bilateral low back pain with bilateral sciatica   4. Pain in multiple finger joints   5. Chronic pain syndrome   6. Chronic, continuous use of opioids   7. Chronic left hip pain   8. Left wrist pain   9. Psoriatic arthritis ([place])   10. Transient weakness of left leg   11. Chronic sacroiliac joint pain   12. Sciatica of left side   13. DDD (degenerative disc disease), lumbar   Based on our discussion today and upon review of the [facility] practitioner database information it is  appropriate to refill her medications.  We will keep her on this current regimen as she has done well on this historically.  Based on the chronicity and severity of her low back pain I am requesting an MRI for further evaluation.  She has had some intermittent weakness to the lower extremity of a giveaway nature and some calf cramping with sciatica symptoms for a extended period of time though intermittent nature.  Recently has flared up and is not improving and is unresponsive to conservative therapy.  We will schedule her for an epidural steroid injection within the next month once we have the MRI for further evaluation.  In the meantime want her to continue with her primary care physician for baseline medical care with follow-up as mentioned in 1 month.  Follow Up Instructions:  I discussed the assessment and treatment plan with the patient. The patient was provided an opportunity to ask questions and all were answered. The patient agreed with the plan and demonstrated an understanding of the instructions.   The patient was advised to call back or seek an in-person evaluation if the symptoms worsen or if the condition fails to improve as anticipated.  I provided 30 minutes of non-face-to-face time during this encounter.   Molli Barrows, MD

## 2021-08-03 NOTE — Patient Instructions (Signed)

## 2021-10-14 ENCOUNTER — Ambulatory Visit: Payer: Commercial Managed Care - PPO | Attending: Anesthesiology | Admitting: Anesthesiology

## 2021-10-14 ENCOUNTER — Other Ambulatory Visit: Payer: Self-pay

## 2021-10-14 ENCOUNTER — Encounter: Payer: Self-pay | Admitting: Anesthesiology

## 2021-10-14 DIAGNOSIS — M5432 Sciatica, left side: Secondary | ICD-10-CM

## 2021-10-14 DIAGNOSIS — M5441 Lumbago with sciatica, right side: Secondary | ICD-10-CM

## 2021-10-14 DIAGNOSIS — L4050 Arthropathic psoriasis, unspecified: Secondary | ICD-10-CM | POA: Diagnosis not present

## 2021-10-14 DIAGNOSIS — G8929 Other chronic pain: Secondary | ICD-10-CM

## 2021-10-14 DIAGNOSIS — M25512 Pain in left shoulder: Secondary | ICD-10-CM

## 2021-10-14 DIAGNOSIS — M25511 Pain in right shoulder: Secondary | ICD-10-CM

## 2021-10-14 DIAGNOSIS — M25552 Pain in left hip: Secondary | ICD-10-CM

## 2021-10-14 DIAGNOSIS — G894 Chronic pain syndrome: Secondary | ICD-10-CM

## 2021-10-14 DIAGNOSIS — M25549 Pain in joints of unspecified hand: Secondary | ICD-10-CM | POA: Diagnosis not present

## 2021-10-14 DIAGNOSIS — F1190 Opioid use, unspecified, uncomplicated: Secondary | ICD-10-CM

## 2021-10-14 DIAGNOSIS — M5442 Lumbago with sciatica, left side: Secondary | ICD-10-CM | POA: Diagnosis not present

## 2021-10-14 DIAGNOSIS — M25532 Pain in left wrist: Secondary | ICD-10-CM

## 2021-10-14 MED ORDER — OXYCODONE-ACETAMINOPHEN 10-325 MG PO TABS: 1.0000 | ORAL_TABLET | Freq: Four times a day (QID) | ORAL | 0 refills | Status: AC | PRN

## 2021-10-14 MED ORDER — OXYCODONE-ACETAMINOPHEN 10-325 MG PO TABS: 1.0000 | ORAL_TABLET | Freq: Four times a day (QID) | ORAL | 0 refills | Status: DC | PRN

## 2021-10-17 ENCOUNTER — Encounter: Payer: Self-pay | Admitting: Anesthesiology

## 2021-10-17 NOTE — Progress Notes (Signed)
Virtual Visit via Telephone Note  I connected with Davona Hovatter on 10/17/21 at 10:15 AM EST by telephone and verified that I am speaking with the correct person using two identifiers.  Location: Patient: Home Provider: Pain control center   I discussed the limitations, risks, security and privacy concerns of performing an evaluation and management service by telephone and the availability of in person appointments. I also discussed with the patient that there may be a patient responsible charge related to this service. The patient expressed understanding and agreed to proceed.   History of Present Illness: I spoke with Arianne via telephone as we are unable link for the video portion of the conference and she reports that she is doing well with her current regimen.  Her pain has been under reasonable control but she continues to have periodic exacerbations and spasming in the low back with radiation into the left lateral leg with sciatica symptoms.  She is requesting a possible epidural here in the next few months.  This pain has been quite persistent and she denies any previous MRI of the lumbar region.  Her bowel and bladder function has been stable and despite efforts at stretching strengthening and physical therapy this pain persists.   She still has diffuse body pain related to her arthritis.  She is taking her medications as prescribed and these continue to work effectively for her.  No side effects are reported.  She gets approximately 70% to 75% relief for about 4 to 6 hours following each dose and she is staying stable with her medicines.  No side effects as mentioned and no illicit or diverting activity is reported.  The quality characteristic and distribution of her pain also remained stable.  Review of systems: General: No fevers or chills Pulmonary: No shortness of breath or dyspnea Cardiac: No angina or palpitations or lightheadedness GI: No abdominal pain or constipation Psych: No  depression    Observations/Objective:  Current Outpatient Medications:    [START ON 11/22/2021] oxyCODONE-acetaminophen (PERCOCET) 10-325 MG tablet, Take 1 tablet by mouth every 6 (six) hours as needed for pain., Disp: 120 tablet, Rfl: 0   albuterol (VENTOLIN HFA) 108 (90 Base) MCG/ACT inhaler, Inhale 2 puffs into the lungs every 6 (six) hours as needed for wheezing or shortness of breath., Disp: 1 each, Rfl: 0   Ascorbic Acid (VITAMIN C) 1000 MG tablet, Take 1,000 mg by mouth daily., Disp: , Rfl:    benzonatate (TESSALON PERLES) 100 MG capsule, Take 1 capsule (100 mg total) by mouth 3 (three) times daily as needed for cough., Disp: 30 capsule, Rfl: 0   brompheniramine-pseudoephedrine-DM 30-2-10 MG/5ML syrup, Take 10 mLs by mouth 4 (four) times daily as needed., Disp: 200 mL, Rfl: 0   carvedilol (COREG) 25 MG tablet, Take 1 tablet (25 mg total) by mouth 2 (two) times daily. NO FURTHER REFILLS UNTIL SEEN IN CLINIC. MUST KEEP SCHEDULED APPOINTMENT., Disp: 180 tablet, Rfl: 0   cephALEXin (KEFLEX) 500 MG capsule, Take 1 capsule (500 mg total) by mouth 2 (two) times daily., Disp: 14 capsule, Rfl: 0   Cholecalciferol 25 MCG (1000 UT) tablet, Take by mouth daily. , Disp: , Rfl:    clobetasol (TEMOVATE) 0.05 % external solution, Apply 1 application topically as needed. , Disp: , Rfl:    estradiol (CLIMARA - DOSED IN MG/24 HR) 0.025 mg/24hr patch, Place 1 patch (0.025 mg total) onto the skin once a week., Disp: 4 patch, Rfl: 0   fluticasone (FLONASE) 50 MCG/ACT nasal  spray, Place 2 sprays into both nostrils daily., Disp: 16 g, Rfl: 3   Ixekizumab 80 MG/ML SOSY, Inject 80 mg into the skin as directed. [facility], Disp: , Rfl:    levothyroxine (SYNTHROID) 100 MCG tablet, Take 1 tablet (100 mcg total) by mouth daily before breakfast., Disp: 90 tablet, Rfl: 0   lisinopril (ZESTRIL) 20 MG tablet, TAKE 1 TABLET(20 MG) BY MOUTH DAILY, Disp: 90 tablet, Rfl: 0   Multiple Vitamin (MULTI VITAMIN PO), Take 1 tablet  by mouth daily., Disp: , Rfl:    naloxone (NARCAN) nasal spray 4 mg/0.1 mL, To reverse excess sedation from narcotics, Disp: 2 kit, Rfl: 1   omeprazole (PRILOSEC) 40 MG capsule, Take 1 capsule (40 mg total) by mouth daily., Disp: 30 capsule, Rfl: 0   ondansetron (ZOFRAN-ODT) 4 MG disintegrating tablet, Take 1 tablet (4 mg total) by mouth every 8 (eight) hours as needed for nausea or vomiting., Disp: 20 tablet, Rfl: 0   [START ON 10/23/2021] oxyCODONE-acetaminophen (PERCOCET) 10-325 MG tablet, Take 1 tablet by mouth every 6 (six) hours as needed for pain., Disp: 120 tablet, Rfl: 0   promethazine (PHENERGAN) 12.5 MG tablet, TAKE 1 TABLET(12.5 MG) BY MOUTH EVERY 6 HOURS FOR UP TO 30 DOSES AS NEEDED FOR NAUSEA OR VOMITING, Disp: 30 tablet, Rfl: 0   Sod Picosulfate-Mag Ox-Cit Acd (CLENPIQ) 10-3.5-12 MG-GM -GM/160ML SOLN, Take 1 bottle at 5 PM followed by five 8 oz cups of water and repeat 5 hours before procedure., Disp: 320 mL, Rfl: 0   spironolactone (ALDACTONE) 25 MG tablet, TAKE 1 TABLET(25 MG) BY MOUTH DAILY, Disp: 30 tablet, Rfl: 0   Past Medical History:  Diagnosis Date   Arthritis    Psoriatic   Cardiomyopathy (HCC)    CHF (congestive heart failure) (HCC)    DDD (degenerative disc disease), lumbar 08/02/2021   Hyperlipidemia    Hypertension    Psoriasis    Sciatica of left side 08/02/2021   Thyroid disease    Transient weakness of left leg 08/13/2019   Wears contact lenses      Assessment and Plan: 1. Psoriasis with arthropathy (HCC)   2. Chronic pain of both shoulders   3. Chronic bilateral low back pain with bilateral sciatica   4. Pain in multiple finger joints   5. Chronic pain syndrome   6. Chronic, continuous use of opioids   7. Chronic left hip pain   8. Left wrist pain   9. Sciatica of left side   Based on our discussion today it is appropriate to refill her medicines for the next 2 months he is to be dated for February 18 and March 20.  No other changes in the  pharmacologic regimen will be initiated.  I want her to continue follow-up with her primary care physicians for baseline medical care.  We have talked about exercises and activity and its importance and to continue with core stretching strengthening as tolerated for her low back and leg pain.  The pain has been persistent and I think it is appropriate to proceed with further investigational studies and an MRI of the lumbar spine will be requested.  If she continues to have severe left side sciatica and continued low back pain I think she would be a reasonable candidate for a therapeutic and diagnostic lumbar epidural steroid injection.  We talked about this and have reviewed the risks and benefits of the procedure.  We will schedule this for 2 months per patient request.  Follow Up Instructions:    I discussed the assessment and treatment plan with the patient. The patient was provided an opportunity to ask questions and all were answered. The patient agreed with the plan and demonstrated an understanding of the instructions.   The patient was advised to call back or seek an in-person evaluation if the symptoms worsen or if the condition fails to improve as anticipated.  I provided 30 minutes of non-face-to-face time during this encounter.   Molli Barrows, MD

## 2021-12-12 ENCOUNTER — Ambulatory Visit
Admission: EM | Admit: 2021-12-12 | Discharge: 2021-12-12 | Disposition: A | Discharge status: Home / Self Care | Payer: Commercial Managed Care - PPO | Attending: Emergency Medicine | Admitting: Emergency Medicine

## 2021-12-12 DIAGNOSIS — A084 Viral intestinal infection, unspecified: Secondary | ICD-10-CM | POA: Insufficient documentation

## 2021-12-12 DIAGNOSIS — Z20822 Contact with and (suspected) exposure to covid-19: Secondary | ICD-10-CM | POA: Insufficient documentation

## 2021-12-12 LAB — RESP PANEL BY RT-PCR (FLU A&B, COVID) ARPGX2
Influenza A by PCR: NEGATIVE
Influenza B by PCR: NEGATIVE
SARS Coronavirus 2 by RT PCR: NEGATIVE

## 2021-12-12 MED ORDER — IBUPROFEN 600 MG PO TABS: 600.0000 mg | ORAL_TABLET | Freq: Four times a day (QID) | ORAL | 0 refills | Status: AC | PRN

## 2021-12-12 MED ORDER — ONDANSETRON 8 MG PO TBDP
8.0000 mg | ORAL_TABLET | Freq: Once | ORAL | Status: AC
  Administered 2021-12-12: 8 mg via ORAL

## 2021-12-12 MED ORDER — ONDANSETRON 8 MG PO TBDP: ORAL_TABLET | ORAL | 0 refills | Status: DC

## 2021-12-12 NOTE — ED Triage Notes (Signed)
2 day h/o HA, abdominal cramping, nausea, diarrhea and emesis. Pt reports about 6-7 episodes of emesis. No meds taken. No new foods. Notes decreased appetite.  ?

## 2021-12-12 NOTE — Discharge Instructions (Addendum)
I will contact you if and only if your COVID or flu, positive.  I will call in the appropriate antiviral.  In the meantime, Zofran, push electrolyte containing fluids.  May take 600 mg of ibuprofen 3-4 times a day as needed for pain, headache.  Zofran will also slow down the diarrhea. ?

## 2021-12-12 NOTE — ED Provider Notes (Signed)
HPI ? ?SUBJECTIVE: ? ?Krista Kennedy is a 56 y.o. female who presents with 2 days of nausea, 6-7 episodes of nonbilious, nonbloody emesis, 2 episodes of watery, nonbloody diarrhea starting today.  She reports anorexia, headache, nonmigratory, nonradiating abdominal cramping before having a bowel movement or vomiting which resolves afterwards.  No fevers, body aches, nasal congestion, rhinorrhea, sore throat, loss of sense of smell or taste, cough, shortness of breath, abdominal distention, urinary or vaginal complaints.  No change in urine output.  No raw or undercooked foods, questionable leftovers, recent antibiotics, recent travel.  No contacts with similar symptoms.  No known COVID or flu exposure.  She got both the COVID and flu vaccines.  She is currently taking her opiates as prescribed.  She tried 400 mg of ibuprofen for her headache without improvement in her symptoms.  There are no aggravating or alleviating factors. Patient has a past medical history of CHF, hypertension, hypothyroidism, cardiomyopathy, psoriatic arthritis for which she takes opiates on an ongoing basis.  No history of abdominal surgeries.  PCP: [facility]. ? ?Past Medical History:  ?Diagnosis Date  ? Arthritis   ? Psoriatic  ? Cardiomyopathy ([place])   ? CHF (congestive heart failure) ([place])   ? DDD (degenerative disc disease), lumbar 08/02/2021  ? Hyperlipidemia   ? Hypertension   ? Psoriasis   ? Sciatica of left side 08/02/2021  ? Thyroid disease   ? Transient weakness of left leg 08/13/2019  ? Wears contact lenses   ? ? ?Past Surgical History:  ?Procedure Laterality Date  ? COLONOSCOPY WITH PROPOFOL N/A 06/30/2020  ? Procedure: COLONOSCOPY WITH PROPOFOL;  Surgeon: Virgel Manifold, MD;  Location: [facility];  Service: Endoscopy;  Laterality: N/A;  ? COLONOSCOPY WITH PROPOFOL N/A 07/20/2020  ? Procedure: COLONOSCOPY WITH PROPOFOL;  Surgeon: Virgel Manifold, MD;  Location: [facility];  Service: Endoscopy;   Laterality: N/A;  poor prep  ? ENDOMETRIAL ABLATION  2015  ? ESOPHAGOGASTRODUODENOSCOPY (EGD) WITH PROPOFOL N/A 06/30/2020  ? Procedure: ESOPHAGOGASTRODUODENOSCOPY (EGD) WITH BIOPSY;  Surgeon: Virgel Manifold, MD;  Location: [facility];  Service: Endoscopy;  Laterality: N/A;  ? THYROIDECTOMY    ? TONSILLECTOMY    ? TUBAL LIGATION    ? ? ?Family History  ?Problem Relation Age of Onset  ? Hypertension Mother   ? Heart disease Father   ?     CABG   ? Hyperlipidemia Father   ? Hypertension Father   ? Diabetes Father   ? ? ?Social History  ? ?Tobacco Use  ? Smoking status: Former  ?  Packs/day: 0.25  ?  Years: 15.00  ?  Pack years: 3.75  ?  Types: Cigarettes  ?  Quit date: 2012  ?  Years since quitting: 11.2  ? Smokeless tobacco: Former  ?Vaping Use  ? Vaping Use: Former  ? Substances: Flavoring  ?Substance Use Topics  ? Alcohol use: Not Currently  ?  Comment: social  ? Drug use: No  ? ? ?No current facility-administered medications for this encounter. ? ?Current Outpatient Medications:  ?  ibuprofen (ADVIL) 600 MG tablet, Take 1 tablet (600 mg total) by mouth every 6 (six) hours as needed., Disp: 30 tablet, Rfl: 0 ?  ondansetron (ZOFRAN-ODT) 8 MG disintegrating tablet, 1/2- 1 tablet q 8 hr prn nausea, vomiting, Disp: 20 tablet, Rfl: 0 ?  albuterol (VENTOLIN HFA) 108 (90 Base) MCG/ACT inhaler, Inhale 2 puffs into the lungs every 6 (six) hours as needed for wheezing  or shortness of breath., Disp: 1 each, Rfl: 0 ?  Ascorbic Acid (VITAMIN C) 1000 MG tablet, Take 1,000 mg by mouth daily., Disp: , Rfl:  ?  carvedilol (COREG) 25 MG tablet, Take 1 tablet (25 mg total) by mouth 2 (two) times daily. NO FURTHER REFILLS UNTIL SEEN IN CLINIC. MUST KEEP SCHEDULED APPOINTMENT., Disp: 180 tablet, Rfl: 0 ?  Cholecalciferol 25 MCG (1000 UT) tablet, Take by mouth daily. , Disp: , Rfl:  ?  clobetasol (TEMOVATE) 0.05 % external solution, Apply 1 application topically as needed. , Disp: , Rfl:  ?  estradiol (CLIMARA - DOSED IN  MG/24 HR) 0.025 mg/24hr patch, Place 1 patch (0.025 mg total) onto the skin once a week., Disp: 4 patch, Rfl: 0 ?  fluticasone (FLONASE) 50 MCG/ACT nasal spray, Place 2 sprays into both nostrils daily., Disp: 16 g, Rfl: 3 ?  Ixekizumab 80 MG/ML SOSY, Inject 80 mg into the skin as directed.  [facility]erm, Disp: , Rfl:  ?  levothyroxine (SYNTHROID) 100 MCG tablet, Take 1 tablet (100 mcg total) by mouth daily before breakfast., Disp: 90 tablet, Rfl: 0 ?  lisinopril (ZESTRIL) 20 MG tablet, TAKE 1 TABLET(20 MG) BY MOUTH DAILY, Disp: 90 tablet, Rfl: 0 ?  Multiple Vitamin (MULTI VITAMIN PO), Take 1 tablet by mouth daily., Disp: , Rfl:  ?  naloxone (NARCAN) nasal spray 4 mg/0.1 mL, To reverse excess sedation from narcotics, Disp: 2 kit, Rfl: 1 ?  omeprazole (PRILOSEC) 40 MG capsule, Take 1 capsule (40 mg total) by mouth daily., Disp: 30 capsule, Rfl: 0 ?  oxyCODONE-acetaminophen (PERCOCET) 10-325 MG tablet, Take 1 tablet by mouth every 6 (six) hours as needed for pain., Disp: 120 tablet, Rfl: 0 ?  Sod Picosulfate-Mag Ox-Cit Acd (CLENPIQ) 10-3.5-12 MG-GM -GM/160ML SOLN, Take 1 bottle at 5 PM followed by five 8 oz cups of water and repeat 5 hours before procedure., Disp: 320 mL, Rfl: 0 ?  spironolactone (ALDACTONE) 25 MG tablet, TAKE 1 TABLET(25 MG) BY MOUTH DAILY, Disp: 30 tablet, Rfl: 0 ? ?Allergies  ?Allergen Reactions  ? Other   ?  Hair dye   ? Sulfa Antibiotics Hives  ?  Rash ?  ? Sulfacetamide Sodium   ?  Rash  ? ? ? ?ROS ? ?As noted in HPI.  ? ?Physical Exam ? ?BP 99/65 (BP Location: Left Arm)   Pulse 79   Temp 97.8 ?F (36.6 ?C) (Oral)   Resp 18   Wt 79.4 kg   SpO2 100%   BMI 25.11 kg/m?  ? ?Constitutional: Well developed, well nourished, no acute distress.  Moving around the table comfortably. ?Eyes:  EOMI, conjunctiva normal bilaterally ?HENT: Normocephalic, atraumatic,mucus membranes moist ?Respiratory: Normal inspiratory effort ?Cardiovascular: Normal rate ?GI: nondistended.  Normal appearance, soft,  nontender, active bowel sounds, no rebound, guarding. ?Back: No CVAT. ?skin: No rash, skin intact ?Musculoskeletal: no deformities ?Neurologic: Alert & oriented x 3, no focal neuro deficits ?Psychiatric: Speech and behavior appropriate ? ? ?ED Course ? ? ?Medications  ?ondansetron (ZOFRAN-ODT) disintegrating tablet 8 mg (8 mg Oral Given 12/12/21 1204)  ? ? ?Orders Placed This Encounter  ?Procedures  ? Resp Panel by RT-PCR (Flu A&B, Covid) Nasopharyngeal Swab  ?  Standing Status:   Standing  ?  Number of Occurrences:   1  ? Airborne and Contact precautions  ?  Standing Status:   Standing  ?  Number of Occurrences:   1  ? ? ?Results for orders placed or performed during the hospital  encounter of 12/12/21 (from the past 24 hour(s))  ?Resp Panel by RT-PCR (Flu A&B, Covid) Nasopharyngeal Swab     Status: None  ? Collection Time: 12/12/21 11:57 AM  ? Specimen: Nasopharyngeal Swab; Nasopharyngeal(NP) swabs in vial transport medium  ?Result Value Ref Range  ? SARS Coronavirus 2 by RT PCR NEGATIVE NEGATIVE  ? Influenza A by PCR NEGATIVE NEGATIVE  ? Influenza B by PCR NEGATIVE NEGATIVE  ? ?No results found. ? ?ED Clinical Impression ? ?1. Viral gastroenteritis   ?2. Encounter for laboratory testing for COVID-19 virus   ?  ? ?ED Assessment/Plan ? ?Suspect gastroenteritis.  Will check a COVID and flu.  Will call patient at [phone] if positive and we will prescribe the appropriate antiviral.  Patient has [place]. ? ?COVID, flu PCR negative. ? ?Getting Zofran 8 here.  Home with Zofran, Tylenol/ibuprofen, push electrolyte containing fluids.  Follow-up with primary care.  ER return precautions given. ? ?Discussed labs,  MDM, treatment plan, and plan for follow-up with patient. Discussed sn/sx that should prompt return to the ED. patient agrees with plan.  ? ?Meds ordered this encounter  ?Medications  ? ondansetron (ZOFRAN-ODT) disintegrating tablet 8 mg  ? ondansetron (ZOFRAN-ODT) 8 MG disintegrating tablet  ?  Sig: 1/2- 1 tablet  q 8 hr prn nausea, vomiting  ?  Dispense:  20 tablet  ?  Refill:  0  ? ibuprofen (ADVIL) 600 MG tablet  ?  Sig: Take 1 tablet (600 mg total) by mouth every 6 (six) hours as needed.  ?  Dispense:  30 tablet  ?  Refill:  0  ?

## 2021-12-15 ENCOUNTER — Encounter: Payer: Self-pay | Admitting: Anesthesiology

## 2021-12-15 ENCOUNTER — Ambulatory Visit: Payer: Commercial Managed Care - PPO | Attending: Anesthesiology | Admitting: Anesthesiology

## 2021-12-15 DIAGNOSIS — M25511 Pain in right shoulder: Secondary | ICD-10-CM

## 2021-12-15 DIAGNOSIS — M5442 Lumbago with sciatica, left side: Secondary | ICD-10-CM | POA: Diagnosis not present

## 2021-12-15 DIAGNOSIS — F1190 Opioid use, unspecified, uncomplicated: Secondary | ICD-10-CM

## 2021-12-15 DIAGNOSIS — M5441 Lumbago with sciatica, right side: Secondary | ICD-10-CM

## 2021-12-15 DIAGNOSIS — M25512 Pain in left shoulder: Secondary | ICD-10-CM

## 2021-12-15 DIAGNOSIS — M51369 Other intervertebral disc degeneration, lumbar region without mention of lumbar back pain or lower extremity pain: Secondary | ICD-10-CM

## 2021-12-15 DIAGNOSIS — M5136 Other intervertebral disc degeneration, lumbar region: Secondary | ICD-10-CM

## 2021-12-15 DIAGNOSIS — G894 Chronic pain syndrome: Secondary | ICD-10-CM

## 2021-12-15 DIAGNOSIS — M533 Sacrococcygeal disorders, not elsewhere classified: Secondary | ICD-10-CM

## 2021-12-15 DIAGNOSIS — G8929 Other chronic pain: Secondary | ICD-10-CM

## 2021-12-15 DIAGNOSIS — M25552 Pain in left hip: Secondary | ICD-10-CM

## 2021-12-15 DIAGNOSIS — M25549 Pain in joints of unspecified hand: Secondary | ICD-10-CM

## 2021-12-15 MED ORDER — OXYCODONE-ACETAMINOPHEN 10-325 MG PO TABS: 1.0000 | ORAL_TABLET | ORAL | 0 refills | Status: AC | PRN

## 2021-12-15 NOTE — Progress Notes (Signed)
Virtual Visit via Telephone Note ? ?I connected with Krista Kennedy on 12/15/21 at  1:15 PM EDT by telephone and verified that I am speaking with the correct person using two identifiers. ? ?Location: ?Patient: Home ?Provider: [facility] ?  ?I discussed the limitations, risks, security and privacy concerns of performing an evaluation and management service by telephone and the availability of in person appointments. I also discussed with the patient that there may be a patient responsible charge related to this service. The patient expressed understanding and agreed to proceed. ? ? ?History of Present Illness: ?I spoke with Krista Kennedy today via telephone as we were unable to link for the video portion of the virtual conference.  She continues to have diffuse body pain with her rheumatoid arthritis and worsening low back pain she reports.  She is also having some associated numbness and tingling affecting her arms and legs.  She is due to see Dr. Melrose Nakayama in neurology in 30 days.  We have previously requested an MRI of her low back for her back pain and lower extremity sciatica symptoms but the change in increasing lower extremity numbness and new onset upper extremity numbness is concerning.  She has failed to present for previous MRI request.  She is taking her medications as prescribed she states that the pain has increased.  She is generally utilizing oxycodone 10 mg tablets 4 times a day and gets about 50 to 75% relief but she is breaking through more frequently.  She is requesting an increase in dosing if possible.  No side effects reported with the medications.  No lower extremity weakness is noted no problems with bowel or bladder function are mentioned today.  Otherwise she is in her usual state of health. ? ?Review of systems: ?General: No fevers or chills ?Pulmonary: No shortness of breath or dyspnea ?Cardiac: No angina or palpitations or lightheadedness ?GI: No abdominal pain or constipation ?Psych: No  depression  ?  ?Observations/Objective: ? ?Current Outpatient Medications:  ?  [START ON 01/21/2022] oxyCODONE-acetaminophen (PERCOCET) 10-325 MG tablet, Take 1 tablet by mouth every 4 (four) hours as needed for pain., Disp: 135 tablet, Rfl: 0 ?  albuterol (VENTOLIN HFA) 108 (90 Base) MCG/ACT inhaler, Inhale 2 puffs into the lungs every 6 (six) hours as needed for wheezing or shortness of breath., Disp: 1 each, Rfl: 0 ?  Ascorbic Acid (VITAMIN C) 1000 MG tablet, Take 1,000 mg by mouth daily., Disp: , Rfl:  ?  carvedilol (COREG) 25 MG tablet, Take 1 tablet (25 mg total) by mouth 2 (two) times daily. NO FURTHER REFILLS UNTIL SEEN IN CLINIC. MUST KEEP SCHEDULED APPOINTMENT., Disp: 180 tablet, Rfl: 0 ?  Cholecalciferol 25 MCG (1000 UT) tablet, Take by mouth daily. , Disp: , Rfl:  ?  clobetasol (TEMOVATE) 0.05 % external solution, Apply 1 application topically as needed. , Disp: , Rfl:  ?  estradiol (CLIMARA - DOSED IN MG/24 HR) 0.025 mg/24hr patch, Place 1 patch (0.025 mg total) onto the skin once a week., Disp: 4 patch, Rfl: 0 ?  fluticasone (FLONASE) 50 MCG/ACT nasal spray, Place 2 sprays into both nostrils daily., Disp: 16 g, Rfl: 3 ?  ibuprofen (ADVIL) 600 MG tablet, Take 1 tablet (600 mg total) by mouth every 6 (six) hours as needed., Disp: 30 tablet, Rfl: 0 ?  Ixekizumab 80 MG/ML SOSY, Inject 80 mg into the skin as directed. Ila Derm, Disp: , Rfl:  ?  levothyroxine (SYNTHROID) 100 MCG tablet, Take 1 tablet (100  mcg total) by mouth daily before breakfast., Disp: 90 tablet, Rfl: 0 ?  lisinopril (ZESTRIL) 20 MG tablet, TAKE 1 TABLET(20 MG) BY MOUTH DAILY, Disp: 90 tablet, Rfl: 0 ?  Multiple Vitamin (MULTI VITAMIN PO), Take 1 tablet by mouth daily., Disp: , Rfl:  ?  naloxone (NARCAN) nasal spray 4 mg/0.1 mL, To reverse excess sedation from narcotics, Disp: 2 kit, Rfl: 1 ?  omeprazole (PRILOSEC) 40 MG capsule, Take 1 capsule (40 mg total) by mouth daily., Disp: 30 capsule, Rfl: 0 ?  ondansetron (ZOFRAN-ODT) 8 MG  disintegrating tablet, 1/2- 1 tablet q 8 hr prn nausea, vomiting, Disp: 20 tablet, Rfl: 0 ?  [START ON 12/22/2021] oxyCODONE-acetaminophen (PERCOCET) 10-325 MG tablet, Take 1 tablet by mouth every 4 (four) hours as needed for pain., Disp: 135 tablet, Rfl: 0 ?  Sod Picosulfate-Mag Ox-Cit Acd (CLENPIQ) 10-3.5-12 MG-GM -GM/160ML SOLN, Take 1 bottle at 5 PM followed by five 8 oz cups of water and repeat 5 hours before procedure., Disp: 320 mL, Rfl: 0 ?  spironolactone (ALDACTONE) 25 MG tablet, TAKE 1 TABLET(25 MG) BY MOUTH DAILY, Disp: 30 tablet, Rfl: 0  ? ?Past Medical History:  ?Diagnosis Date  ? Arthritis   ? Psoriatic  ? Cardiomyopathy ([place])   ? CHF (congestive heart failure) ([place])   ? DDD (degenerative disc disease), lumbar 08/02/2021  ? Hyperlipidemia   ? Hypertension   ? Psoriasis   ? Sciatica of left side 08/02/2021  ? Thyroid disease   ? Transient weakness of left leg 08/13/2019  ? Wears contact lenses   ?  ? ?Assessment and Plan: ?1. Chronic pain syndrome   ?2. Chronic, continuous use of opioids   ?3. Chronic bilateral low back pain with bilateral sciatica   ?4. Chronic pain of both shoulders   ?5. Pain in multiple finger joints   ?6. Chronic left hip pain   ?7. Chronic sacroiliac joint pain   ?8. DDD (degenerative disc disease), lumbar   ?Based on our discussion today I think it is imperative that she does get in for an MRI and this has been we requested.  I am in agreement for her to see neurology and we have talked about concerns that she may have some more central stenosis affecting the cervical spine or lumbar spine which requires further evaluation.  She maintains that she will comply and present for the neurology evaluation.  We will refill her medicines for the next month but she is overdue for a urine screen which has been requested multiple times in the past.  This will need to be done before her next refill.  I want her to continue with stretching strengthening exercises and routine care as previously  discussed.  She is scheduled for a 47-month return to clinic.  I have reviewed the [facility] practitioner database information and it is appropriate.  She has not shown no signs of any diverting or illicit use and has derive functional improvement with chronic opioid therapy.  Continue follow-up with her primary care physicians for baseline medical care ? ?Follow Up Instructions: ? ?  ?I discussed the assessment and treatment plan with the patient. The patient was provided an opportunity to ask questions and all were answered. The patient agreed with the plan and demonstrated an understanding of the instructions. ?  ?The patient was advised to call back or seek an in-person evaluation if the symptoms worsen or if the condition fails to improve as anticipated. ? ?I provided 30 minutes of  non-face-to-face time during this encounter. ? ? ?Molli Barrows, MD  ?

## 2021-12-20 ENCOUNTER — Telehealth: Payer: Self-pay | Admitting: Anesthesiology

## 2021-12-20 NOTE — Telephone Encounter (Signed)
Spoke with patient about an early fill.  Last filled 11/22/21.  Patient states that she has a problem with something pressing on a nerve.  States she and DR Andree Elk spoke on 12/15/21 and that he is aware that she is having to take extra medicine.  I do not see a reflection in her chart of this.  I told her I would contact DR Andree Elk and see if he will approve and 2 day early fill.  ? ?Text sent to Dr Andree Elk.  ?

## 2021-12-20 NOTE — Telephone Encounter (Signed)
Dr Andree Elk replied back, NO, patient may not fill 2 days early.  Called patient and she is aware of no early fill on this Rx,  ?

## 2022-01-25 ENCOUNTER — Ambulatory Visit
Admission: RE | Admit: 2022-01-25 | Discharge: 2022-01-25 | Disposition: A | Discharge status: Home / Self Care | Payer: Commercial Managed Care - PPO | Source: Ambulatory Visit | Attending: Anesthesiology | Admitting: Anesthesiology

## 2022-01-25 DIAGNOSIS — M5442 Lumbago with sciatica, left side: Secondary | ICD-10-CM | POA: Insufficient documentation

## 2022-01-25 DIAGNOSIS — M5136 Other intervertebral disc degeneration, lumbar region: Secondary | ICD-10-CM | POA: Insufficient documentation

## 2022-01-25 DIAGNOSIS — G8929 Other chronic pain: Secondary | ICD-10-CM | POA: Insufficient documentation

## 2022-01-25 DIAGNOSIS — M5441 Lumbago with sciatica, right side: Secondary | ICD-10-CM | POA: Insufficient documentation

## 2022-02-08 ENCOUNTER — Telehealth: Payer: Self-pay | Admitting: Cardiovascular Disease

## 2022-02-08 NOTE — Telephone Encounter (Signed)
3 attempts to schedule fu appt from recall list.   Deleting recall.   

## 2022-02-14 ENCOUNTER — Telehealth: Payer: Self-pay

## 2022-02-14 NOTE — Telephone Encounter (Signed)
She runs out of meds in 6 days and Dr. Andree Elk is out this week on vacation. Also she wants a procedure, her MRI results are in. Please advise.

## 2022-02-21 ENCOUNTER — Ambulatory Visit: Payer: Commercial Managed Care - PPO | Attending: Anesthesiology | Admitting: Anesthesiology

## 2022-02-21 ENCOUNTER — Other Ambulatory Visit: Payer: Self-pay | Admitting: *Deleted

## 2022-02-21 ENCOUNTER — Encounter: Payer: Self-pay | Admitting: Anesthesiology

## 2022-02-21 ENCOUNTER — Other Ambulatory Visit: Payer: Self-pay

## 2022-02-21 VITALS — BP 153/94 | HR 81 | Temp 97.4°F | Resp 16 | Ht 70.0 in | Wt 170.0 lb

## 2022-02-21 DIAGNOSIS — M25512 Pain in left shoulder: Secondary | ICD-10-CM | POA: Diagnosis present

## 2022-02-21 DIAGNOSIS — M5442 Lumbago with sciatica, left side: Secondary | ICD-10-CM | POA: Diagnosis present

## 2022-02-21 DIAGNOSIS — G8929 Other chronic pain: Secondary | ICD-10-CM | POA: Diagnosis present

## 2022-02-21 DIAGNOSIS — F1190 Opioid use, unspecified, uncomplicated: Secondary | ICD-10-CM | POA: Insufficient documentation

## 2022-02-21 DIAGNOSIS — M25552 Pain in left hip: Secondary | ICD-10-CM | POA: Insufficient documentation

## 2022-02-21 DIAGNOSIS — M25511 Pain in right shoulder: Secondary | ICD-10-CM | POA: Diagnosis present

## 2022-02-21 DIAGNOSIS — G894 Chronic pain syndrome: Secondary | ICD-10-CM | POA: Diagnosis present

## 2022-02-21 DIAGNOSIS — M5441 Lumbago with sciatica, right side: Secondary | ICD-10-CM | POA: Diagnosis present

## 2022-02-21 DIAGNOSIS — M5136 Other intervertebral disc degeneration, lumbar region: Secondary | ICD-10-CM | POA: Diagnosis present

## 2022-02-21 DIAGNOSIS — M25532 Pain in left wrist: Secondary | ICD-10-CM | POA: Insufficient documentation

## 2022-02-21 DIAGNOSIS — M533 Sacrococcygeal disorders, not elsewhere classified: Secondary | ICD-10-CM | POA: Diagnosis present

## 2022-02-21 DIAGNOSIS — M25549 Pain in joints of unspecified hand: Secondary | ICD-10-CM | POA: Insufficient documentation

## 2022-02-21 MED ORDER — OXYCODONE-ACETAMINOPHEN 10-325 MG PO TABS: 1.0000 | ORAL_TABLET | ORAL | 0 refills | Status: DC | PRN

## 2022-02-21 NOTE — Progress Notes (Unsigned)
Nursing Pain Medication Assessment:  Safety precautions to be maintained throughout the outpatient stay will include: orient to surroundings, keep bed in low position, maintain call bell within reach at all times, provide assistance with transfer out of bed and ambulation.  Medication Inspection Compliance: Krista Kennedy did not comply with our request to bring her pills to be counted. She was reminded that bringing the medication bottles, even when empty, is a requirement.  Medication: None brought in. Pill/Patch Count: None available to be counted. Bottle Appearance: No container available. Did not bring bottle(s) to appointment. Filled Date: N/A Last Medication intake:  Today

## 2022-02-21 NOTE — Patient Instructions (Signed)
Preparing for your procedure (without sedation) Instructions: . Oral Intake: Do not eat or drink anything for at least 3 hours prior to your procedure. . Transportation: Unless otherwise stated by your physician, you may drive yourself after the procedure. . Blood Pressure Medicine: Take your blood pressure medicine with a sip of water the morning of the procedure. . Insulin: Take only  of your normal insulin dose. . Preventing infections: Shower with an antibacterial soap the morning of your procedure. . Build-up your immune system: Take 1000 mg of Vitamin C with every meal (3 times a day) the day prior to your procedure. . Pregnancy: If you are pregnant, call and cancel the procedure. . Sickness: If you have a cold, fever, or any active infections, call and cancel the procedure. . Arrival: You must be in the facility at least 30 minutes prior to your scheduled procedure. . Children: Do not bring any children with you. . Dress appropriately: Bring dark clothing that you would not mind if they get stained. . Valuables: Do not bring any jewelry or valuables. Procedure appointments are reserved for interventional treatments only. . No Prescription Refills. . No medication changes will be discussed during procedure appointments. No disability issues will be discussed.Epidural Steroid Injection Patient Information  Description: The epidural space surrounds the nerves as they exit the spinal cord.  In some patients, the nerves can be compressed and inflamed by a bulging disc or a tight spinal canal (spinal stenosis).  By injecting steroids into the epidural space, we can bring irritated nerves into direct contact with a potentially helpful medication.  These steroids act directly on the irritated nerves and can reduce swelling and inflammation which often leads to decreased pain.  Epidural steroids may be injected anywhere along the spine and from the neck to the low back depending upon the location  of your pain.   After numbing the skin with local anesthetic (like Novocaine), a small needle is passed into the epidural space slowly.  You may experience a sensation of pressure while this is being done.  The entire block usually last less than 10 minutes.  Conditions which may be treated by epidural steroids:  Low back and leg pain Neck and arm pain Spinal stenosis Post-laminectomy syndrome Herpes zoster (shingles) pain Pain from compression fractures  Preparation for the injection:  Do not eat any solid food or dairy products within 8 hours of your appointment.  You may drink clear liquids up to 3 hours before appointment.  Clear liquids include water, black coffee, juice or soda.  No milk or cream please. You may take your regular medication, including pain medications, with a sip of water before your appointment  Diabetics should hold regular insulin (if taken separately) and take 1/2 normal NPH dos the morning of the procedure.  Carry some sugar containing items with you to your appointment. A driver must accompany you and be prepared to drive you home after your procedure.  Bring all your current medications with your. An IV may be inserted and sedation may be given at the discretion of the physician.   A blood pressure cuff, EKG and other monitors will often be applied during the procedure.  Some patients may need to have extra oxygen administered for a short period. You will be asked to provide medical information, including your allergies, prior to the procedure.  We must know immediately if you are taking blood thinners (like Coumadin/Warfarin)  Or if you are allergic to IV iodine contrast (  dye). We must know if you could possible be pregnant.  Possible side-effects: Bleeding from needle site Infection (rare, may require surgery) Nerve injury (rare) Numbness & tingling (temporary) Difficulty urinating (rare, temporary) Spinal headache ( a headache worse with upright  posture) Light -headedness (temporary) Pain at injection site (several days) Decreased blood pressure (temporary) Weakness in arm/leg (temporary) Pressure sensation in back/neck (temporary)  Call if you experience: Fever/chills associated with headache or increased back/neck pain. Headache worsened by an upright position. New onset weakness or numbness of an extremity below the injection site Hives or difficulty breathing (go to the emergency room) Inflammation or drainage at the infection site Severe back/neck pain Any new symptoms which are concerning to you  Please note:  Although the local anesthetic injected can often make your back or neck feel good for several hours after the injection, the pain will likely return.  It takes 3-7 days for steroids to work in the epidural space.  You may not notice any pain relief for at least that one week.  If effective, we will often do a series of three injections spaced 3-6 weeks apart to maximally decrease your pain.  After the initial series, we generally will wait several months before considering a repeat injection of the same type.  If you have any questions, please call [phone] . [facility] 

## 2022-02-22 MED ORDER — OXYCODONE-ACETAMINOPHEN 10-325 MG PO TABS: 1.0000 | ORAL_TABLET | ORAL | 0 refills | Status: DC | PRN

## 2022-02-22 NOTE — Telephone Encounter (Signed)
Change in pharmacy

## 2022-02-22 NOTE — Progress Notes (Signed)
Subjective:  Patient ID: Krista Kennedy, female    DOB: [birth year]  Age: 56 y.o. MRN: [id number]  CC: Back Pain (low), Arm Pain (tingling), and Leg Pain (tingling)   Procedure: None  HPI Krista Kennedy presents for reevaluation.  She has been doing reasonably well since her last visit.  She is taking her medications as prescribed and these continue to give her good relief.  She describes about 75 to 80% relief and she takes them on average about 3-4 times a day.  Occasionally this is every 4 hours if she is having acute pain symptoms with her rheumatoid arthritis and multiple joint areas.  Sometimes she is able to reserve this to a every 6-8 hour regimen.  No side effects with her medications are reported.  She states that the quality characteristic and distribution of the pain are otherwise stable with no recent changes reported she is trying to do stretching strengthening exercises and stay active additionally.  Outpatient Medications Prior to Visit  Medication Sig Dispense Refill   albuterol (VENTOLIN HFA) 108 (90 Base) MCG/ACT inhaler Inhale 2 puffs into the lungs every 6 (six) hours as needed for wheezing or shortness of breath. 1 each 0   Ascorbic Acid (VITAMIN C) 1000 MG tablet Take 1,000 mg by mouth daily.     carvedilol (COREG) 25 MG tablet Take 1 tablet (25 mg total) by mouth 2 (two) times daily. NO FURTHER REFILLS UNTIL SEEN IN CLINIC. MUST KEEP SCHEDULED APPOINTMENT. 180 tablet 0   Cholecalciferol 25 MCG (1000 UT) tablet Take by mouth daily.      clobetasol (TEMOVATE) 0.05 % external solution Apply 1 application topically as needed.      estradiol (CLIMARA - DOSED IN MG/24 HR) 0.025 mg/24hr patch Place 1 patch (0.025 mg total) onto the skin once a week. 4 patch 0   fluticasone (FLONASE) 50 MCG/ACT nasal spray Place 2 sprays into both nostrils daily. 16 g 3   ibuprofen (ADVIL) 600 MG tablet Take 1 tablet (600 mg total) by mouth every 6 (six) hours as needed. 30 tablet 0   Ixekizumab  80 MG/ML SOSY Inject 80 mg into the skin as directed. [facility]     levothyroxine (SYNTHROID) 100 MCG tablet Take 1 tablet (100 mcg total) by mouth daily before breakfast. 90 tablet 0   lisinopril (ZESTRIL) 20 MG tablet TAKE 1 TABLET(20 MG) BY MOUTH DAILY 90 tablet 0   Multiple Vitamin (MULTI VITAMIN PO) Take 1 tablet by mouth daily.     naloxone (NARCAN) nasal spray 4 mg/0.1 mL To reverse excess sedation from narcotics 2 kit 1   omeprazole (PRILOSEC) 40 MG capsule Take 1 capsule (40 mg total) by mouth daily. 30 capsule 0   ondansetron (ZOFRAN-ODT) 8 MG disintegrating tablet 1/2- 1 tablet q 8 hr prn nausea, vomiting 20 tablet 0   Sod Picosulfate-Mag Ox-Cit Acd (CLENPIQ) 10-3.5-12 MG-GM -GM/160ML SOLN Take 1 bottle at 5 PM followed by five 8 oz cups of water and repeat 5 hours before procedure. 320 mL 0   spironolactone (ALDACTONE) 25 MG tablet TAKE 1 TABLET(25 MG) BY MOUTH DAILY 30 tablet 0   oxyCODONE-acetaminophen (PERCOCET) 10-325 MG tablet Take 1 tablet by mouth every 6 (six) hours as needed for pain.     No facility-administered medications prior to visit.    Review of Systems CNS: No confusion or sedation Cardiac: No angina or palpitations GI: No abdominal pain or constipation Constitutional: No nausea vomiting fevers or chills  Objective:  BP (!) 153/94   Pulse 81   Temp (!) 97.4 F (36.3 C)   Resp 16   Ht 5' 10" (1.778 m)   Wt 170 lb (77.1 kg)   SpO2 100%   BMI 24.39 kg/m    BP Readings from Last 3 Encounters:  02/21/22 (!) 153/94  12/12/21 99/65  06/19/21 126/66     Wt Readings from Last 3 Encounters:  02/21/22 170 lb (77.1 kg)  12/12/21 175 lb (79.4 kg)  06/19/21 165 lb (74.8 kg)     Physical Exam Pt is alert and oriented PERRL EOMI HEART IS RRR no murmur or rub LCTA no wheezing or rales MUSCULOSKELETAL she is ambulating well with good balance and motion.  Lower extremity strength and function appears to be well preserved.  She does have a positive  straight leg raise on the left side negative on the right lower extremity muscle tone and bulk is at baseline with good strength she does have some paraspinous muscle tenderness in the bilateral lumbar region.  Labs  Lab Results  Component Value Date   HGBA1C 6.4 (H) 05/06/2020   HGBA1C 6.7 (H) 07/20/2018   HGBA1C 5.9 (H) 04/11/2017   Lab Results  Component Value Date   LDLCALC 118 (H) 05/06/2020   CREATININE 1.29 (H) 09/04/2020    -------------------------------------------------------------------------------------------------------------------- Lab Results  Component Value Date   WBC 7.2 09/04/2020   HGB 13.4 09/04/2020   HCT 40.6 09/04/2020   PLT 270 09/04/2020   GLUCOSE 117 (H) 09/04/2020   CHOL 200 (H) 05/06/2020   TRIG 122 05/06/2020   HDL 60 05/06/2020   LDLCALC 118 (H) 05/06/2020   ALT 16 05/06/2020   AST 24 05/06/2020   NA 136 09/04/2020   K 3.9 09/04/2020   CL 97 (L) 09/04/2020   CREATININE 1.29 (H) 09/04/2020   BUN 24 (H) 09/04/2020   CO2 28 09/04/2020   TSH 0.130 (L) 05/06/2020   HGBA1C 6.4 (H) 05/06/2020    --------------------------------------------------------------------------------------------------------------------- MR LUMBAR SPINE WO CONTRAST  Result Date: 01/25/2022 CLINICAL DATA:  Low back pain, symptoms persist with > 6 wks treatment Lumbar radiculopathy, symptoms persist with > 6 wks treatment EXAM: MRI LUMBAR SPINE WITHOUT CONTRAST TECHNIQUE: Multiplanar, multisequence MR imaging of the lumbar spine was performed. No intravenous contrast was administered. COMPARISON:  Lumbar radiographs 05/20/2019. FINDINGS: Segmentation: Standard segmentation is assumed. The inferior-most fully formed intervertebral disc labeled L5-S1. Alignment:  Straightening.  No substantial sagittal subluxation. Vertebrae: Vertebral body heights are maintained. No focal marrow edema to suggest acute fracture discitis/osteomyelitis. Mildly heterogeneous bone marrow without  suspicious bone lesion. Conus medullaris and cauda equina: Conus extends to the superior L2 level. Conus appears normal. Paraspinal and other soft tissues: Small left renal cyst. Disc levels: T12-L1: No significant disc protrusion, foraminal stenosis, or canal stenosis. L1-L2: Mild disc bulging without significant stenosis. L2-L3: Disc height loss and desiccation. Mild broad disc bulging without significant stenosis. L3-L4: Disc height loss and desiccation. Broad disc bulging with bilateral foraminal disc protrusions. Resulting mild bilateral foraminal stenosis with left far lateral/extraforaminal disc closely approximating the exiting/exited left L4 nerve. Mild right subarticular recess stenosis without significant canal stenosis. L4-L5: Mild disc bulging with bilateral foraminal disc protrusions. Moderate bilateral foraminal stenosis. No significant canal stenosis. L5-S1: Mild disc bulging with bilateral subarticular annular fissures. Resulting mild bilateral foraminal stenosis. No significant canal stenosis. IMPRESSION: 1. At L4-L5, moderate bilateral foraminal stenosis. 2. At L3-L4, mild bilateral foraminal stenosis with left far lateral/extraforaminal disc closely approximating the exiting/exited  left L4 nerve. 3. At L5-S1, mild bilateral foraminal stenosis. Electronically Signed   By: Margaretha Sheffield M.D.   On: 01/25/2022 17:19     Assessment & Plan:   Evetta was seen today for back pain, arm pain and leg pain.  Diagnoses and all orders for this visit:  Chronic pain syndrome  Chronic, continuous use of opioids  Chronic bilateral low back pain with bilateral sciatica -     Lumbar Epidural Injection; Future  Chronic pain of both shoulders  Pain in multiple finger joints  Chronic left hip pain  Chronic sacroiliac joint pain  DDD (degenerative disc disease), lumbar -     Lumbar Epidural Injection; Future  Left wrist pain  Other orders -     Discontinue: oxyCODONE-acetaminophen  (PERCOCET) 10-325 MG tablet; Take 1 tablet by mouth every 4 (four) hours as needed for pain. -     oxyCODONE-acetaminophen (PERCOCET) 10-325 MG tablet; Take 1 tablet by mouth every 4 (four) hours as needed for pain.        ----------------------------------------------------------------------------------------------------------------------  Problem List Items Addressed This Visit       Unprioritized   Chronic hip pain (Chronic)   Relevant Medications   oxyCODONE-acetaminophen (PERCOCET) 10-325 MG tablet (Start on 03/23/2022)   Chronic low back pain (Chronic)   Relevant Medications   oxyCODONE-acetaminophen (PERCOCET) 10-325 MG tablet (Start on 03/23/2022)   Other Relevant Orders   Lumbar Epidural Injection   Chronic pain syndrome - Primary (Chronic)   Relevant Medications   oxyCODONE-acetaminophen (PERCOCET) 10-325 MG tablet (Start on 03/23/2022)   Chronic sacroiliac joint pain (Chronic)   Relevant Medications   oxyCODONE-acetaminophen (PERCOCET) 10-325 MG tablet (Start on 03/23/2022)   Left wrist pain (Chronic)   Chronic shoulder pain   Relevant Medications   oxyCODONE-acetaminophen (PERCOCET) 10-325 MG tablet (Start on 03/23/2022)   Chronic, continuous use of opioids   DDD (degenerative disc disease), lumbar   Relevant Medications   oxyCODONE-acetaminophen (PERCOCET) 10-325 MG tablet (Start on 03/23/2022)   Other Relevant Orders   Lumbar Epidural Injection   Pain in multiple finger joints      ----------------------------------------------------------------------------------------------------------------------  1. Chronic pain syndrome Based on her current circumstances and after review of the [facility] practitioner database information I think it is appropriate to continue with her current regimen.  Unfortunately she has required chronic opioid therapy for her diffuse inflammatory process but seems to respond favorably to this with no side effects reported.  No illicit  or diverting use noted.  She gets good relief and good improvement in her functional status and the medications are reported to enable her to stay active and sleep better at night.  2. Chronic, continuous use of opioids As above  3. Chronic bilateral low back pain with bilateral sciatica She is having persistent sciatica symptoms with a positive straight leg raise on the left side negative on the right.  I have reviewed her MRI with her.  She has had a lot of increased back pain recently and would like to proceed with an epidural steroid injection in the near future.  I have gone over the risks and benefits of the procedure with her but her sciatica symptoms have been quite recalcitrant and are also requiring that she take more medication for management.  In the meantime I want her to continue with core stretching strengthening exercises as reviewed - Lumbar Epidural Injection; Future  4. Chronic pain of both shoulders As above and continue current medication management  5. Pain  in multiple finger joints As above  6. Chronic left hip pain As above and hopefully she will get some relief from the epidural additionally if this is radicular in nature.  7. Chronic sacroiliac joint pain   8. DDD (degenerative disc disease), lumbar As above - Lumbar Epidural Injection; Future  9. Left wrist pain Continue follow-up with her primary care physicians for baseline medical care and rheumatology.    ----------------------------------------------------------------------------------------------------------------------  I have discontinued Krista Kennedy's oxyCODONE-acetaminophen. I am also having her start on oxyCODONE-acetaminophen. Additionally, I am having her maintain her vitamin C, Cholecalciferol, Ixekizumab, clobetasol, naloxone, fluticasone, Multiple Vitamin (MULTI VITAMIN PO), estradiol, Clenpiq, omeprazole, albuterol, spironolactone, lisinopril, levothyroxine, carvedilol, ondansetron, and  ibuprofen.   Meds ordered this encounter  Medications   DISCONTD: oxyCODONE-acetaminophen (PERCOCET) 10-325 MG tablet    Sig: Take 1 tablet by mouth every 4 (four) hours as needed for pain.    Dispense:  135 tablet    Refill:  0   oxyCODONE-acetaminophen (PERCOCET) 10-325 MG tablet    Sig: Take 1 tablet by mouth every 4 (four) hours as needed for pain.    Dispense:  135 tablet    Refill:  0   Patient's Medications  New Prescriptions   OXYCODONE-ACETAMINOPHEN (PERCOCET) 10-325 MG TABLET    Take 1 tablet by mouth every 4 (four) hours as needed for pain.  Previous Medications   ALBUTEROL (VENTOLIN HFA) 108 (90 BASE) MCG/ACT INHALER    Inhale 2 puffs into the lungs every 6 (six) hours as needed for wheezing or shortness of breath.   ASCORBIC ACID (VITAMIN C) 1000 MG TABLET    Take 1,000 mg by mouth daily.   CARVEDILOL (COREG) 25 MG TABLET    Take 1 tablet (25 mg total) by mouth 2 (two) times daily. NO FURTHER REFILLS UNTIL SEEN IN CLINIC. MUST KEEP SCHEDULED APPOINTMENT.   CHOLECALCIFEROL 25 MCG (1000 UT) TABLET    Take by mouth daily.    CLOBETASOL (TEMOVATE) 0.05 % EXTERNAL SOLUTION    Apply 1 application topically as needed.    ESTRADIOL (CLIMARA - DOSED IN MG/24 HR) 0.025 MG/24HR PATCH    Place 1 patch (0.025 mg total) onto the skin once a week.   FLUTICASONE (FLONASE) 50 MCG/ACT NASAL SPRAY    Place 2 sprays into both nostrils daily.   IBUPROFEN (ADVIL) 600 MG TABLET    Take 1 tablet (600 mg total) by mouth every 6 (six) hours as needed.   IXEKIZUMAB 80 MG/ML SOSY    Inject 80 mg into the skin as directed. [facility]   LEVOTHYROXINE (SYNTHROID) 100 MCG TABLET    Take 1 tablet (100 mcg total) by mouth daily before breakfast.   LISINOPRIL (ZESTRIL) 20 MG TABLET    TAKE 1 TABLET(20 MG) BY MOUTH DAILY   MULTIPLE VITAMIN (MULTI VITAMIN PO)    Take 1 tablet by mouth daily.   NALOXONE (NARCAN) NASAL SPRAY 4 MG/0.1 ML    To reverse excess sedation from narcotics   OMEPRAZOLE (PRILOSEC) 40  MG CAPSULE    Take 1 capsule (40 mg total) by mouth daily.   ONDANSETRON (ZOFRAN-ODT) 8 MG DISINTEGRATING TABLET    1/2- 1 tablet q 8 hr prn nausea, vomiting   SOD PICOSULFATE-MAG OX-CIT ACD (CLENPIQ) 10-3.5-12 MG-GM -GM/160ML SOLN    Take 1 bottle at 5 PM followed by five 8 oz cups of water and repeat 5 hours before procedure.   SPIRONOLACTONE (ALDACTONE) 25 MG TABLET    TAKE 1 TABLET(25 MG)  BY MOUTH DAILY  Modified Medications   Modified Medication Previous Medication   OXYCODONE-ACETAMINOPHEN (PERCOCET) 10-325 MG TABLET oxyCODONE-acetaminophen (PERCOCET) 10-325 MG tablet      Take 1 tablet by mouth every 4 (four) hours as needed for pain.    Take 1 tablet by mouth every 6 (six) hours as needed for pain.  Discontinued Medications   No medications on file   ----------------------------------------------------------------------------------------------------------------------  Follow-up: Return in about 2 weeks (around 03/07/2022) for evaluation, procedure.  She is scheduled for return to clinic in approximately 1 to 2 weeks for an epidural steroid  Molli Barrows, MD

## 2022-02-24 LAB — TOXASSURE SELECT 13 (MW), URINE

## 2022-03-16 ENCOUNTER — Ambulatory Visit (HOSPITAL_BASED_OUTPATIENT_CLINIC_OR_DEPARTMENT_OTHER): Payer: Commercial Managed Care - PPO | Admitting: Anesthesiology

## 2022-03-16 ENCOUNTER — Other Ambulatory Visit: Payer: Self-pay | Admitting: Anesthesiology

## 2022-03-16 ENCOUNTER — Ambulatory Visit
Admission: RE | Admit: 2022-03-16 | Discharge: 2022-03-16 | Disposition: A | Discharge status: Home / Self Care | Payer: Commercial Managed Care - PPO | Source: Ambulatory Visit | Attending: Anesthesiology | Admitting: Anesthesiology

## 2022-03-16 ENCOUNTER — Encounter: Payer: Self-pay | Admitting: Anesthesiology

## 2022-03-16 VITALS — BP 164/85 | HR 82 | Temp 98.2°F | Resp 18 | Ht 70.0 in | Wt 170.0 lb

## 2022-03-16 DIAGNOSIS — M25552 Pain in left hip: Secondary | ICD-10-CM

## 2022-03-16 DIAGNOSIS — M545 Low back pain, unspecified: Secondary | ICD-10-CM | POA: Diagnosis present

## 2022-03-16 DIAGNOSIS — M51369 Other intervertebral disc degeneration, lumbar region without mention of lumbar back pain or lower extremity pain: Secondary | ICD-10-CM

## 2022-03-16 DIAGNOSIS — M25549 Pain in joints of unspecified hand: Secondary | ICD-10-CM

## 2022-03-16 DIAGNOSIS — R52 Pain, unspecified: Secondary | ICD-10-CM

## 2022-03-16 DIAGNOSIS — M5441 Lumbago with sciatica, right side: Secondary | ICD-10-CM

## 2022-03-16 DIAGNOSIS — Z79891 Long term (current) use of opiate analgesic: Secondary | ICD-10-CM | POA: Diagnosis not present

## 2022-03-16 DIAGNOSIS — G894 Chronic pain syndrome: Secondary | ICD-10-CM | POA: Diagnosis not present

## 2022-03-16 DIAGNOSIS — M25512 Pain in left shoulder: Secondary | ICD-10-CM

## 2022-03-16 DIAGNOSIS — L4050 Arthropathic psoriasis, unspecified: Secondary | ICD-10-CM | POA: Diagnosis not present

## 2022-03-16 DIAGNOSIS — M5442 Lumbago with sciatica, left side: Secondary | ICD-10-CM

## 2022-03-16 DIAGNOSIS — M5136 Other intervertebral disc degeneration, lumbar region: Secondary | ICD-10-CM | POA: Insufficient documentation

## 2022-03-16 DIAGNOSIS — M533 Sacrococcygeal disorders, not elsewhere classified: Secondary | ICD-10-CM | POA: Insufficient documentation

## 2022-03-16 DIAGNOSIS — M79662 Pain in left lower leg: Secondary | ICD-10-CM | POA: Insufficient documentation

## 2022-03-16 DIAGNOSIS — M25511 Pain in right shoulder: Secondary | ICD-10-CM

## 2022-03-16 DIAGNOSIS — G8929 Other chronic pain: Secondary | ICD-10-CM

## 2022-03-16 DIAGNOSIS — F1190 Opioid use, unspecified, uncomplicated: Secondary | ICD-10-CM

## 2022-03-16 DIAGNOSIS — M5432 Sciatica, left side: Secondary | ICD-10-CM

## 2022-03-16 MED ORDER — OXYCODONE-ACETAMINOPHEN 10-325 MG PO TABS: 1.0000 | ORAL_TABLET | ORAL | 0 refills | Status: AC | PRN

## 2022-03-16 MED ORDER — ROPIVACAINE HCL 2 MG/ML IJ SOLN
10.0000 mL | Freq: Once | INTRAMUSCULAR | Status: AC
  Administered 2022-03-16: 1 mL via EPIDURAL
  Filled 2022-03-16: qty 20

## 2022-03-16 MED ORDER — SODIUM CHLORIDE 0.9% FLUSH
10.0000 mL | Freq: Once | INTRAVENOUS | Status: AC
  Administered 2022-03-16: 10 mL

## 2022-03-16 MED ORDER — IOPAMIDOL (ISOVUE-M 200) INJECTION 41%
20.0000 mL | Freq: Once | INTRAMUSCULAR | Status: DC | PRN
  Administered 2022-03-16: 20 mL

## 2022-03-16 MED ORDER — LIDOCAINE HCL (PF) 1 % IJ SOLN
5.0000 mL | Freq: Once | INTRAMUSCULAR | Status: AC
  Administered 2022-03-16: 5 mL via SUBCUTANEOUS
  Filled 2022-03-16: qty 5

## 2022-03-16 MED ORDER — TRIAMCINOLONE ACETONIDE 40 MG/ML IJ SUSP
40.0000 mg | Freq: Once | INTRAMUSCULAR | Status: AC
  Administered 2022-03-16: 40 mg
  Filled 2022-03-16: qty 1

## 2022-03-16 NOTE — Patient Instructions (Addendum)
____________________________________________________________________________________________  Post-procedure Information What to expect: Most procedures involve the use of a local anesthetic (numbing medicine), and a steroid (anti-inflammatory medicine).  The local anesthetics may cause temporary numbness and weakness of the legs or arms, depending on the location of the block. This numbness/weakness may last 4-6 hours, depending on the local anesthetic used. In rare instances, it can last up to 24 hours. While numb, you must be very careful not to injure the extremity.  After any procedure, you could expect the pain to get better within 15-20 minutes. This relief is temporary and may last 4-6 hours. Once the local anesthetics wears off, you could experience discomfort, possibly more than usual, for up to 10 (ten) days. In the case of radiofrequencies, it may last up to 6 weeks. Surgeries may take up to 8 weeks for the healing process. The discomfort is due to the irritation caused by needles going through skin and muscle. To minimize the discomfort, we recommend using ice the first day, and heat from then on. The ice should be applied for 15 minutes on, and 15 minutes off. Keep repeating this cycle until bedtime. Avoid applying the ice directly to the skin, to prevent frostbite. Heat should be used daily, until the pain improves (4-10 days). Be careful not to burn yourself.  Occasionally you may experience muscle spasms or cramps. These occur as a consequence of the irritation caused by the needle sticks to the muscle and the blood that will inevitably be lost into the surrounding muscle tissue. Blood tends to be very irritating to tissues, which tend to react by going into spasm. These spasms may start the same day of your procedure, but they may also take days to develop. This late onset type of spasm or cramp is usually caused by electrolyte imbalances triggered by the steroids, at the level of the  kidney. Cramps and spasms tend to respond well to muscle relaxants, multivitamins (some are triggered by the procedure, but may have their origins in vitamin deficiencies), and "Gatorade", or any sports drinks that can replenish any electrolyte imbalances. (If you are a diabetic, ask your pharmacist to get you a sugar-free brand.) Warm showers or baths may also be helpful. Stretching exercises are highly recommended.  General Instructions:  Be alert for signs of possible infection: redness, swelling, heat, red streaks, elevated temperature, and/or fever. These typically appear 4 to 6 days after the procedure. Immediately notify your doctor if you experience unusual bleeding, difficulty breathing, or loss of bowel or bladder control. If you experience increased pain, do not increase your pain medicine intake, unless instructed by your pain physician.  Post-Procedure Care:  Be careful in moving about. Muscle spasms in the area of the injection may occur. Applying ice or heat to the area is often helpful. The incidence of spinal headaches after epidural injections ranges between 1.4% and 6%. If you develop a headache that does not seem to respond to conservative therapy, please let your physician know. This can be treated with an epidural blood patch.   Post-procedure numbness or redness is to be expected, however it should average 4 to 6 hours. If numbness and weakness of your extremities begins to develop 4 to 6 hours after your procedure, and is felt to be progressing and worsening, immediately contact your physician.  Diet:  If you experience nausea, do not eat until this sensation goes away. If you had a "Stellate Ganglion Block" for upper extremity "Reflex Sympathetic Dystrophy", do not eat or   drink until your hoarseness goes away. In any case, always start with liquids first and if you tolerate them well, then slowly progress to more solid foods.  Activity:  For the first 4 to 6 hours after the  procedure, use caution in moving about as you may experience numbness and/or weakness. Use caution in cooking, using household electrical appliances, and climbing steps. If you need to reach your Doctor call our office: [phone] (During business hours) or [phone] (After business hours).  Business Hours: Monday-Thursday 8:00 am - 4:00 PM    Fridays: Closed     In case of an emergency: In case of emergency, call 911 or go to the nearest emergency room and have the physician there call us.  Interpretation of Procedure Every nerve block has two components: a diagnostic component, and a treatment component. Unrealistic expectations are the most common causes of "perceived failure".  In a perfect world, a single nerve block should be able to completely and permanently eliminate the pain. Sadly, the world is not perfect.  Most pain management nerve blocks are performed using local anesthetics and steroids. Steroids are responsible for any long-term benefit that you may experience. Their purpose is to decrease any chronic swelling that may exist in the area. Steroids begin to work immediately after being injected. However, most patients will not experience any benefits until 5 to 10 days after the injection, when the swelling has come down to the point where they can tell a difference. Steroids will only help if there is swelling to be treated. As such, they can assist with the diagnosis. If effective, they suggest an inflammatory component to the pain, and if ineffective, they rule out inflammation as the main cause or component of the problem. If the problem is one of mechanical compression, you will get no benefit from those steroids.   In the case of local anesthetics, they have a crucial role in the diagnosis of your condition. Most will begin to work within15 to 20 minutes after injection. The duration will depend on the type used (short- vs. Long-acting). It is of outmost importance that  patients keep tract of their pain, after the procedure. To assist with this matter, a "Post-procedure Pain Diary" is provided. Make sure to complete it and to bring it back to your follow-up appointment.  As long as the patient keeps accurate, detailed records of their symptoms after every procedure, and returns to have those interpreted, every procedure will provide us with invaluable information. Even a block that does not provide the patient with any relief, will always provide us with information about the mechanism and the origin of the pain. The only time a nerve block can be considered a waste of time is when patients do not keep track of the results, or do not keep their post-procedure appointment.  Reporting the results back to your physician The Pain Score  Pain is a subjective complaint. It cannot be seen, touched, or measured. We depend entirely on the patient's report of the pain in order to assess your condition and treatment. To evaluate the pain, we use a pain scale, where "0" means "No Pain", and a "10" is "the worst possible pain that you can even imagine" (i.e. something like been eaten alive by a shark or being torn apart by a lion).   Use the Pain Scale provided. You will frequently be asked to rate your pain. Please be accurate, remember that medical decisions will be based on your   responses. Please do not rate your pain above a 10. Doing so is actually interpreted as "symptom magnification" (exaggeration). To put this into perspective, when you tell us that your pain is at a 10 (ten), what you are saying is that there is nothing we can do to make this pain any worse. (Carefully think about that.) ____________________________________________________________________________________________  Pain Management Discharge Instructions  General Discharge Instructions :  If you need to reach your doctor call: Monday-Friday 8:00 am - 4:00 pm at [phone] or toll free [phone].   After clinic hours [phone] to have operator reach doctor.  Bring all of your medication bottles to all your appointments in the pain clinic.  To cancel or reschedule your appointment with Pain Management please remember to call 24 hours in advance to avoid a fee.  Refer to the educational materials which you have been given on: General Risks, I had my Procedure. Discharge Instructions, Post Sedation.  Post Procedure Instructions:  The drugs you were given will stay in your system until tomorrow, so for the next 24 hours you should not drive, make any legal decisions or drink any alcoholic beverages.  You may eat anything you prefer, but it is better to start with liquids then soups and crackers, and gradually work up to solid foods.  Please notify your doctor immediately if you have any unusual bleeding, trouble breathing or pain that is not related to your normal pain.  Depending on the type of procedure that was done, some parts of your body may feel week and/or numb.  This usually clears up by tonight or the next day.  Walk with the use of an assistive device or accompanied by an adult for the 24 hours.  You may use ice on the affected area for the first 24 hours.  Put ice in a Ziploc bag and cover with a towel and place against area 15 minutes on 15 minutes off.  You may switch to heat after 24 hours.Epidural Steroid Injection Patient Information  Description: The epidural space surrounds the nerves as they exit the spinal cord.  In some patients, the nerves can be compressed and inflamed by a bulging disc or a tight spinal canal (spinal stenosis).  By injecting steroids into the epidural space, we can bring irritated nerves into direct contact with a potentially helpful medication.  These steroids act directly on the irritated nerves and can reduce swelling and inflammation which often leads to decreased pain.  Epidural steroids may be injected anywhere along the spine and from the  neck to the low back depending upon the location of your pain.   After numbing the skin with local anesthetic (like Novocaine), a small needle is passed into the epidural space slowly.  You may experience a sensation of pressure while this is being done.  The entire block usually last less than 10 minutes.  Conditions which may be treated by epidural steroids:  Low back and leg pain Neck and arm pain Spinal stenosis Post-laminectomy syndrome Herpes zoster (shingles) pain Pain from compression fractures  Preparation for the injection:  Do not eat any solid food or dairy products within 8 hours of your appointment.  You may drink clear liquids up to 3 hours before appointment.  Clear liquids include water, black coffee, juice or soda.  No milk or cream please. You may take your regular medication, including pain medications, with a sip of water before your appointment  Diabetics should hold regular insulin (if taken separately) and take  1/2 normal NPH dos the morning of the procedure.  Carry some sugar containing items with you to your appointment. A driver must accompany you and be prepared to drive you home after your procedure.  Bring all your current medications with your. An IV may be inserted and sedation may be given at the discretion of the physician.   A blood pressure cuff, EKG and other monitors will often be applied during the procedure.  Some patients may need to have extra oxygen administered for a short period. You will be asked to provide medical information, including your allergies, prior to the procedure.  We must know immediately if you are taking blood thinners (like Coumadin/Warfarin)  Or if you are allergic to IV iodine contrast (dye). We must know if you could possible be pregnant.  Possible side-effects: Bleeding from needle site Infection (rare, may require surgery) Nerve injury (rare) Numbness & tingling (temporary) Difficulty urinating (rare, temporary) Spinal  headache ( a headache worse with upright posture) Light -headedness (temporary) Pain at injection site (several days) Decreased blood pressure (temporary) Weakness in arm/leg (temporary) Pressure sensation in back/neck (temporary)  Call if you experience: Fever/chills associated with headache or increased back/neck pain. Headache worsened by an upright position. New onset weakness or numbness of an extremity below the injection site Hives or difficulty breathing (go to the emergency room) Inflammation or drainage at the infection site Severe back/neck pain Any new symptoms which are concerning to you  Please note:  Although the local anesthetic injected can often make your back or neck feel good for several hours after the injection, the pain will likely return.  It takes 3-7 days for steroids to work in the epidural space.  You may not notice any pain relief for at least that one week.  If effective, we will often do a series of three injections spaced 3-6 weeks apart to maximally decrease your pain.  After the initial series, we generally will wait several months before considering a repeat injection of the same type.  If you have any questions, please call [phone] [facility]

## 2022-03-16 NOTE — Progress Notes (Signed)
Subjective:  Patient ID: Krista Kennedy, female    DOB: March 30, 1966  Age: 56 y.o. MRN: [id number]  CC: Back Pain (lower)   Procedure: L5-S1 epidural steroid under fluoroscopic guidance with no sedation  HPI [name] presents for reevaluation.  She was last seen a few months ago and has had more problems with her low back.  She is describing her right posterior lateral lower extremity sciatica and left side hip and lower leg pain.  She gets some occasional calf cramping and this unfortunately has been more troublesome lately.  It is failed conservative therapy and despite attempts at stretching strengthening and medication management it persists.  She presents today desiring a epidural steroid injection.  Otherwise she is doing well with preservation of lower extremity strength and function and doing well with her medication management for her systemic pain.  Outpatient Medications Prior to Visit  Medication Sig Dispense Refill   albuterol (VENTOLIN HFA) 108 (90 Base) MCG/ACT inhaler Inhale 2 puffs into the lungs every 6 (six) hours as needed for wheezing or shortness of breath. 1 each 0   Ascorbic Acid (VITAMIN C) 1000 MG tablet Take 1,000 mg by mouth daily.     carvedilol (COREG) 25 MG tablet Take 1 tablet (25 mg total) by mouth 2 (two) times daily. NO FURTHER REFILLS UNTIL SEEN IN CLINIC. MUST KEEP SCHEDULED APPOINTMENT. 180 tablet 0   Cholecalciferol 25 MCG (1000 UT) tablet Take by mouth daily.      clobetasol (TEMOVATE) 0.05 % external solution Apply 1 application topically as needed.      estradiol (CLIMARA - DOSED IN MG/24 HR) 0.025 mg/24hr patch Place 1 patch (0.025 mg total) onto the skin once a week. 4 patch 0   fluticasone (FLONASE) 50 MCG/ACT nasal spray Place 2 sprays into both nostrils daily. 16 g 3   ibuprofen (ADVIL) 600 MG tablet Take 1 tablet (600 mg total) by mouth every 6 (six) hours as needed. 30 tablet 0   Ixekizumab 80 MG/ML SOSY Inject 80 mg into the skin as  directed. [facility]     lisinopril (ZESTRIL) 20 MG tablet TAKE 1 TABLET(20 MG) BY MOUTH DAILY 90 tablet 0   Multiple Vitamin (MULTI VITAMIN PO) Take 1 tablet by mouth daily.     naloxone (NARCAN) nasal spray 4 mg/0.1 mL To reverse excess sedation from narcotics 2 kit 1   omeprazole (PRILOSEC) 40 MG capsule Take 1 capsule (40 mg total) by mouth daily. 30 capsule 0   [START ON 03/23/2022] oxyCODONE-acetaminophen (PERCOCET) 10-325 MG tablet Take 1 tablet by mouth every 4 (four) hours as needed for pain. 135 tablet 0   spironolactone (ALDACTONE) 25 MG tablet TAKE 1 TABLET(25 MG) BY MOUTH DAILY 30 tablet 0   oxyCODONE-acetaminophen (PERCOCET) 10-325 MG tablet Take 1 tablet by mouth every 4 (four) hours as needed for pain. 135 tablet 0   levothyroxine (SYNTHROID) 100 MCG tablet Take 1 tablet (100 mcg total) by mouth daily before breakfast. (Patient not taking: Reported on 03/16/2022) 90 tablet 0   levothyroxine (SYNTHROID) 75 MCG tablet Take 75 mcg by mouth daily.     ondansetron (ZOFRAN-ODT) 8 MG disintegrating tablet 1/2- 1 tablet q 8 hr prn nausea, vomiting (Patient not taking: Reported on 03/16/2022) 20 tablet 0   Sod Picosulfate-Mag Ox-Cit Acd (CLENPIQ) 10-3.5-12 MG-GM -GM/160ML SOLN Take 1 bottle at 5 PM followed by five 8 oz cups of water and repeat 5 hours before procedure. (Patient not taking: Reported on 03/16/2022) 320 mL  0   No facility-administered medications prior to visit.    Review of Systems CNS: No confusion or sedation Cardiac: No angina or palpitations GI: No abdominal pain or constipation Constitutional: No nausea vomiting fevers or chills  Objective:  BP (!) 164/85   Pulse 82   Temp 98.2 F (36.8 C) (Temporal)   Resp 18   Ht 5\' 10"  (1.778 m)   Wt 170 lb (77.1 kg)   SpO2 100%   BMI 24.39 kg/m    BP Readings from Last 3 Encounters:  03/16/22 (!) 164/85  02/21/22 (!) 153/94  12/12/21 99/65     Wt Readings from Last 3 Encounters:  03/16/22 170 lb (77.1 kg)   02/21/22 170 lb (77.1 kg)  12/12/21 175 lb (79.4 kg)     Physical Exam Pt is alert and oriented PERRL EOMI HEART IS RRR no murmur or rub LCTA no wheezing or rales MUSCULOSKELETAL reveals some paraspinous muscle tenderness in the lumbar region.  She has a positive straight leg raise on the right negative on the left has a mildly antalgic gait but her muscle tone and bulk is good  Labs  Lab Results  Component Value Date   HGBA1C 6.4 (H) 05/06/2020   HGBA1C 6.7 (H) 07/20/2018   HGBA1C 5.9 (H) 04/11/2017   Lab Results  Component Value Date   LDLCALC 118 (H) 05/06/2020   CREATININE 1.29 (H) 09/04/2020    -------------------------------------------------------------------------------------------------------------------- Lab Results  Component Value Date   WBC 7.2 09/04/2020   HGB 13.4 09/04/2020   HCT 40.6 09/04/2020   PLT 270 09/04/2020   GLUCOSE 117 (H) 09/04/2020   CHOL 200 (H) 05/06/2020   TRIG 122 05/06/2020   HDL 60 05/06/2020   LDLCALC 118 (H) 05/06/2020   ALT 16 05/06/2020   AST 24 05/06/2020   NA 136 09/04/2020   K 3.9 09/04/2020   CL 97 (L) 09/04/2020   CREATININE 1.29 (H) 09/04/2020   BUN 24 (H) 09/04/2020   CO2 28 09/04/2020   TSH 0.130 (L) 05/06/2020   HGBA1C 6.4 (H) 05/06/2020    --------------------------------------------------------------------------------------------------------------------- DG PAIN CLINIC C-ARM 1-60 MIN NO REPORT  Result Date: 03/16/2022 Fluoro was used, but no Radiologist interpretation will be provided. Please refer to "NOTES" tab for provider progress note.    Assessment & Plan:   Danisha was seen today for back pain.  Diagnoses and all orders for this visit:  Chronic pain syndrome  Chronic bilateral low back pain with bilateral sciatica -     Lumbar Epidural Injection  DDD (degenerative disc disease), lumbar -     Lumbar Epidural Injection  Chronic, continuous use of opioids  Chronic pain of both  shoulders  Pain in multiple finger joints  Chronic left hip pain  Chronic sacroiliac joint pain  Sciatica of left side  Psoriatic arthritis (HCC)  Other orders -     triamcinolone acetonide (KENALOG-40) injection 40 mg -     sodium chloride flush (NS) 0.9 % injection 10 mL -     ropivacaine (PF) 2 mg/mL (0.2%) (NAROPIN) injection 10 mL -     lidocaine (PF) (XYLOCAINE) 1 % injection 5 mL -     iopamidol (ISOVUE-M) 41 % intrathecal injection 20 mL -     oxyCODONE-acetaminophen (PERCOCET) 10-325 MG tablet; Take 1 tablet by mouth every 4 (four) hours as needed for pain.        ----------------------------------------------------------------------------------------------------------------------  Problem List Items Addressed This Visit       Unprioritized  Chronic hip pain (Chronic)   Relevant Medications   oxyCODONE-acetaminophen (PERCOCET) 10-325 MG tablet (Start on 04/22/2022)   Chronic low back pain (Chronic)   Relevant Medications   oxyCODONE-acetaminophen (PERCOCET) 10-325 MG tablet (Start on 04/22/2022)   Chronic pain syndrome - Primary (Chronic)   Relevant Medications   oxyCODONE-acetaminophen (PERCOCET) 10-325 MG tablet (Start on 04/22/2022)   Chronic sacroiliac joint pain (Chronic)   Relevant Medications   oxyCODONE-acetaminophen (PERCOCET) 10-325 MG tablet (Start on 04/22/2022)   Chronic shoulder pain   Relevant Medications   oxyCODONE-acetaminophen (PERCOCET) 10-325 MG tablet (Start on 04/22/2022)   Chronic, continuous use of opioids   DDD (degenerative disc disease), lumbar   Relevant Medications   oxyCODONE-acetaminophen (PERCOCET) 10-325 MG tablet (Start on 04/22/2022)   Pain in multiple finger joints   Sciatica of left side   Other Visit Diagnoses     Psoriatic arthritis ([place])       Relevant Medications   triamcinolone acetonide (KENALOG-40) injection 40 mg (Completed)   oxyCODONE-acetaminophen (PERCOCET) 10-325 MG tablet (Start on 04/22/2022)          ----------------------------------------------------------------------------------------------------------------------  1. Chronic bilateral low back pain with bilateral sciatica We will proceed with the first epidural today.  We gone over the risks and benefits of the procedure with her in full detail all questions were answered.  Continue stretching strengthening exercises with return to clinic in 1 month for possible repeat epidural. - Lumbar Epidural Injection  2. DDD (degenerative disc disease), lumbar As above - Lumbar Epidural Injection  3. Chronic pain syndrome I have reviewed the [facility] practitioner database information and it is appropriate.  Refills will be given for August refill  4. Chronic, continuous use of opioids As above  5. Chronic pain of both shoulders   6. Pain in multiple finger joints   7. Chronic left hip pain   8. Chronic sacroiliac joint pain   9. Sciatica of left side   10. Psoriatic arthritis ([place])     ----------------------------------------------------------------------------------------------------------------------  I am having Breionna Molino maintain her vitamin C, Cholecalciferol, Ixekizumab, clobetasol, naloxone, fluticasone, Multiple Vitamin (MULTI VITAMIN PO), estradiol, Clenpiq, omeprazole, albuterol, spironolactone, lisinopril, levothyroxine, carvedilol, ondansetron, ibuprofen, oxyCODONE-acetaminophen, levothyroxine, and oxyCODONE-acetaminophen. We administered triamcinolone acetonide, sodium chloride flush, ropivacaine (PF) 2 mg/mL (0.2%), lidocaine (PF), and iopamidol.   Meds ordered this encounter  Medications   triamcinolone acetonide (KENALOG-40) injection 40 mg   sodium chloride flush (NS) 0.9 % injection 10 mL   ropivacaine (PF) 2 mg/mL (0.2%) (NAROPIN) injection 10 mL   lidocaine (PF) (XYLOCAINE) 1 % injection 5 mL   iopamidol (ISOVUE-M) 41 % intrathecal injection 20 mL   oxyCODONE-acetaminophen  (PERCOCET) 10-325 MG tablet    Sig: Take 1 tablet by mouth every 4 (four) hours as needed for pain.    Dispense:  135 tablet    Refill:  0   Patient's Medications  New Prescriptions   No medications on file  Previous Medications   ALBUTEROL (VENTOLIN HFA) 108 (90 BASE) MCG/ACT INHALER    Inhale 2 puffs into the lungs every 6 (six) hours as needed for wheezing or shortness of breath.   ASCORBIC ACID (VITAMIN C) 1000 MG TABLET    Take 1,000 mg by mouth daily.   CARVEDILOL (COREG) 25 MG TABLET    Take 1 tablet (25 mg total) by mouth 2 (two) times daily. NO FURTHER REFILLS UNTIL SEEN IN CLINIC. MUST KEEP SCHEDULED APPOINTMENT.   CHOLECALCIFEROL 25 MCG (1000 UT) TABLET    Take  by mouth daily.    CLOBETASOL (TEMOVATE) 0.05 % EXTERNAL SOLUTION    Apply 1 application topically as needed.    ESTRADIOL (CLIMARA - DOSED IN MG/24 HR) 0.025 MG/24HR PATCH    Place 1 patch (0.025 mg total) onto the skin once a week.   FLUTICASONE (FLONASE) 50 MCG/ACT NASAL SPRAY    Place 2 sprays into both nostrils daily.   IBUPROFEN (ADVIL) 600 MG TABLET    Take 1 tablet (600 mg total) by mouth every 6 (six) hours as needed.   IXEKIZUMAB 80 MG/ML SOSY    Inject 80 mg into the skin as directed. [facility]   LEVOTHYROXINE (SYNTHROID) 100 MCG TABLET    Take 1 tablet (100 mcg total) by mouth daily before breakfast.   LEVOTHYROXINE (SYNTHROID) 75 MCG TABLET    Take 75 mcg by mouth daily.   LISINOPRIL (ZESTRIL) 20 MG TABLET    TAKE 1 TABLET(20 MG) BY MOUTH DAILY   MULTIPLE VITAMIN (MULTI VITAMIN PO)    Take 1 tablet by mouth daily.   NALOXONE (NARCAN) NASAL SPRAY 4 MG/0.1 ML    To reverse excess sedation from narcotics   OMEPRAZOLE (PRILOSEC) 40 MG CAPSULE    Take 1 capsule (40 mg total) by mouth daily.   ONDANSETRON (ZOFRAN-ODT) 8 MG DISINTEGRATING TABLET    1/2- 1 tablet q 8 hr prn nausea, vomiting   OXYCODONE-ACETAMINOPHEN (PERCOCET) 10-325 MG TABLET    Take 1 tablet by mouth every 4 (four) hours as needed for pain.    SOD PICOSULFATE-MAG OX-CIT ACD (CLENPIQ) 10-3.5-12 MG-GM -GM/160ML SOLN    Take 1 bottle at 5 PM followed by five 8 oz cups of water and repeat 5 hours before procedure.   SPIRONOLACTONE (ALDACTONE) 25 MG TABLET    TAKE 1 TABLET(25 MG) BY MOUTH DAILY  Modified Medications   Modified Medication Previous Medication   OXYCODONE-ACETAMINOPHEN (PERCOCET) 10-325 MG TABLET oxyCODONE-acetaminophen (PERCOCET) 10-325 MG tablet      Take 1 tablet by mouth every 4 (four) hours as needed for pain.    Take 1 tablet by mouth every 4 (four) hours as needed for pain.  Discontinued Medications   No medications on file   ----------------------------------------------------------------------------------------------------------------------  Follow-up: Return in 1 month (on 04/16/2022) for evaluation, med refill, procedure.   Procedure: L5-S1 LESI with fluoroscopic guidance and without moderate sedation  NOTE: The risks, benefits, and expectations of the procedure have been discussed and explained to the patient who was understanding and in agreement with suggested treatment plan. No guarantees were made.  DESCRIPTION OF PROCEDURE: Lumbar epidural steroid injection with no IV Versed, EKG, blood pressure, pulse, and pulse oximetry monitoring. The procedure was performed with the patient in the prone position under fluoroscopic guidance.  Sterile prep x3 was initiated and I then injected subcutaneous lidocaine to the overlying L5-S1 site after its fluoroscopic identifictation.  Using strict aseptic technique, I then advanced an 18-gauge Tuohy epidural needle in the midline using interlaminar approach via loss-of-resistance to saline technique. There was negative aspiration for heme or  CSF.  I then confirmed position with both AP and Lateral fluoroscan.  2 cc of contrast dye were injected and a  total of 5 mL of Preservative-Free normal saline mixed with 40 mg of Kenalog and 1cc Ropicaine 0.2 percent were injected  incrementally via the  epidurally placed needle. The needle was removed. The patient tolerated the injection well and was convalesced and discharged to home in stable condition. Should the patient have any  post procedure difficulty they have been instructed on how to contact us for assistance.    Molli Barrows, MD

## 2022-03-17 ENCOUNTER — Telehealth: Payer: Self-pay | Admitting: *Deleted

## 2022-03-17 NOTE — Telephone Encounter (Signed)
Post procedure call;  patient reports that she is doing well.

## 2022-03-22 ENCOUNTER — Telehealth: Payer: Self-pay | Admitting: Anesthesiology

## 2022-03-22 ENCOUNTER — Telehealth: Payer: Self-pay | Admitting: *Deleted

## 2022-03-22 MED ORDER — OXYCODONE-ACETAMINOPHEN 10-325 MG PO TABS: 1.0000 | ORAL_TABLET | ORAL | 0 refills | Status: DC | PRN

## 2022-03-22 NOTE — Telephone Encounter (Signed)
[facility] in [place] does not have her medications, can these be sent to [facility] in [place] please ask one of the physicians will change this. Please notify patient

## 2022-03-22 NOTE — Telephone Encounter (Signed)
Rx request sent to Dr. Lateef 

## 2022-03-23 NOTE — Telephone Encounter (Signed)
Attempted PA for Percocet, received message that med is covered and PA not needed. Patient notified.

## 2022-03-23 NOTE — Telephone Encounter (Signed)
Patient needs PA for medications.

## 2022-04-08 ENCOUNTER — Ambulatory Visit (HOSPITAL_BASED_OUTPATIENT_CLINIC_OR_DEPARTMENT_OTHER): Payer: Commercial Managed Care - PPO | Admitting: Anesthesiology

## 2022-04-08 ENCOUNTER — Encounter: Payer: Self-pay | Admitting: Anesthesiology

## 2022-04-08 DIAGNOSIS — G894 Chronic pain syndrome: Secondary | ICD-10-CM | POA: Diagnosis not present

## 2022-04-08 DIAGNOSIS — M25532 Pain in left wrist: Secondary | ICD-10-CM

## 2022-04-08 DIAGNOSIS — M5442 Lumbago with sciatica, left side: Secondary | ICD-10-CM

## 2022-04-08 DIAGNOSIS — M25512 Pain in left shoulder: Secondary | ICD-10-CM

## 2022-04-08 DIAGNOSIS — M5441 Lumbago with sciatica, right side: Secondary | ICD-10-CM

## 2022-04-08 DIAGNOSIS — G8929 Other chronic pain: Secondary | ICD-10-CM

## 2022-04-08 DIAGNOSIS — L4050 Arthropathic psoriasis, unspecified: Secondary | ICD-10-CM

## 2022-04-08 DIAGNOSIS — M25549 Pain in joints of unspecified hand: Secondary | ICD-10-CM

## 2022-04-08 DIAGNOSIS — F1190 Opioid use, unspecified, uncomplicated: Secondary | ICD-10-CM | POA: Diagnosis not present

## 2022-04-08 DIAGNOSIS — M5136 Other intervertebral disc degeneration, lumbar region: Secondary | ICD-10-CM

## 2022-04-08 DIAGNOSIS — M25552 Pain in left hip: Secondary | ICD-10-CM

## 2022-04-08 DIAGNOSIS — M25511 Pain in right shoulder: Secondary | ICD-10-CM

## 2022-04-08 MED ORDER — OXYCODONE-ACETAMINOPHEN 10-325 MG PO TABS: 1.0000 | ORAL_TABLET | ORAL | 0 refills | Status: DC | PRN

## 2022-04-08 NOTE — Progress Notes (Signed)
Virtual Visit via Kennedy Note  I connected with Krista Kennedy on 04/08/22 at  8:35 AM EDT by Kennedy and verified that I am speaking with the correct person using two identifiers.  Location: Patient: Home Provider: Pain control center   I discussed the limitations, risks, security and privacy concerns of performing an evaluation and management service by Kennedy and the availability of in person appointments. I also discussed with the patient that there may be a patient responsible charge related to this service. The patient expressed understanding and agreed to proceed.   History of Present Illness: I spoke with Krista Kennedy as we are unable to do the video portion of the conference but Krista Kennedy reports that Krista Kennedy did well with her recent epidural.  Her back pain is less severe and less frequent and her lower extremity pain is significantly improved.  Krista Kennedy feels that Krista Kennedy is beginning to get some mild recurrence of a similar pain.  No other changes are noted.  Lower extremity strength function bowel bladder function are stable.  Krista Kennedy still taking her medications and these are well received and Krista Kennedy is getting good relief from these.  Krista Kennedy rates her pain improvement approximately 75% historically with the opioids and no side effects reported.  Krista Kennedy has failed more conservative therapy.  Krista Kennedy also has some diffuse body pain with her rheumatoid arthritis and they are effective in helping with that.Review of systems: General: No fevers or chills Pulmonary: No shortness of breath or dyspnea Cardiac: No angina or palpitations or lightheadedness GI: No abdominal pain or constipation Psych: No depression    Observations/Objective:  Current Outpatient Medications:    albuterol (VENTOLIN HFA) 108 (90 Base) MCG/ACT inhaler, Inhale 2 puffs into the lungs every 6 (six) hours as needed for wheezing or shortness of breath., Disp: 1 each, Rfl: 0   Ascorbic Acid (VITAMIN C) 1000 MG tablet, Take 1,000 mg by  mouth daily., Disp: , Rfl:    carvedilol (COREG) 25 MG tablet, Take 1 tablet (25 mg total) by mouth 2 (two) times daily. NO FURTHER REFILLS UNTIL SEEN IN CLINIC. MUST KEEP SCHEDULED APPOINTMENT., Disp: 180 tablet, Rfl: 0   Cholecalciferol 25 MCG (1000 UT) tablet, Take by mouth daily. , Disp: , Rfl:    clobetasol (TEMOVATE) 0.05 % external solution, Apply 1 application topically as needed. , Disp: , Rfl:    estradiol (CLIMARA - DOSED IN MG/24 HR) 0.025 mg/24hr patch, Place 1 patch (0.025 mg total) onto the skin once a week., Disp: 4 patch, Rfl: 0   fluticasone (FLONASE) 50 MCG/ACT nasal spray, Place 2 sprays into both nostrils daily., Disp: 16 g, Rfl: 3   ibuprofen (ADVIL) 600 MG tablet, Take 1 tablet (600 mg total) by mouth every 6 (six) hours as needed., Disp: 30 tablet, Rfl: 0   Ixekizumab 80 MG/ML SOSY, Inject 80 mg into the skin as directed. [facility], Disp: , Rfl:    levothyroxine (SYNTHROID) 100 MCG tablet, Take 1 tablet (100 mcg total) by mouth daily before breakfast. (Patient not taking: Reported on 03/16/2022), Disp: 90 tablet, Rfl: 0   levothyroxine (SYNTHROID) 75 MCG tablet, Take 75 mcg by mouth daily., Disp: , Rfl:    lisinopril (ZESTRIL) 20 MG tablet, TAKE 1 TABLET(20 MG) BY MOUTH DAILY, Disp: 90 tablet, Rfl: 0   Multiple Vitamin (MULTI VITAMIN PO), Take 1 tablet by mouth daily., Disp: , Rfl:    naloxone (NARCAN) nasal spray 4 mg/0.1 mL, To reverse excess sedation from narcotics, Disp: 2 kit,  Rfl: 1   omeprazole (PRILOSEC) 40 MG capsule, Take 1 capsule (40 mg total) by mouth daily., Disp: 30 capsule, Rfl: 0   ondansetron (ZOFRAN-ODT) 8 MG disintegrating tablet, 1/2- 1 tablet q 8 hr prn nausea, vomiting (Patient not taking: Reported on 03/16/2022), Disp: 20 tablet, Rfl: 0   [START ON 04/22/2022] oxyCODONE-acetaminophen (PERCOCET) 10-325 MG tablet, Take 1 tablet by mouth every 4 (four) hours as needed for pain., Disp: 135 tablet, Rfl: 0   [START ON 05/22/2022] oxyCODONE-acetaminophen  (PERCOCET) 10-325 MG tablet, Take 1 tablet by mouth every 4 (four) hours as needed for pain., Disp: 135 tablet, Rfl: 0   Sod Picosulfate-Mag Ox-Cit Acd (CLENPIQ) 10-3.5-12 MG-GM -GM/160ML SOLN, Take 1 bottle at 5 PM followed by five 8 oz cups of water and repeat 5 hours before procedure. (Patient not taking: Reported on 03/16/2022), Disp: 320 mL, Rfl: 0   spironolactone (ALDACTONE) 25 MG tablet, TAKE 1 TABLET(25 MG) BY MOUTH DAILY, Disp: 30 tablet, Rfl: 0   Past Medical History:  Diagnosis Date   Arthritis    Psoriatic   Cardiomyopathy ([place])    CHF (congestive heart failure) (HCC)    DDD (degenerative disc disease), lumbar 08/02/2021   Hyperlipidemia    Hypertension    Psoriasis    Sciatica of left side 08/02/2021   Thyroid disease    Transient weakness of left leg 08/13/2019   Wears contact lenses      Assessment and Plan: 1. Chronic bilateral low back pain with bilateral sciatica   2. DDD (degenerative disc disease), lumbar   3. Chronic pain syndrome   4. Chronic, continuous use of opioids   5. Chronic pain of both shoulders   6. Pain in multiple finger joints   7. Chronic left hip pain   8. Psoriatic arthritis ([place])   9. Left wrist pain   Based on our discussion today it appears that Krista Kennedy did get good relief with the epidural and the pain is less intense in the low back and sciatica symptoms improved.  I think it is appropriate to reschedule repeat epidural as Krista Kennedy is good making good progress.  I encouraged her to continue with core stretching strengthening and avoidance of certain activities.  Krista Kennedy will need a work release to allow for some limitations on heavy lifting and certain activity.  We will schedule her return for approximately 1 month and I will also refill her opioid medications which appear to be working effectively for her with next refill on 917.  I have reviewed the [facility] practitioner database information and is appropriate for refill.  Continue follow-up with  her primary care physicians for baseline medical care.  Follow Up Instructions:    I discussed the assessment and treatment plan with the patient. The patient was provided an opportunity to ask questions and all were answered. The patient agreed with the plan and demonstrated an understanding of the instructions.   The patient was advised to call back or seek an in-person evaluation if the symptoms worsen or if the condition fails to improve as anticipated.  I provided 30 minutes of non-face-to-face time during this encounter.   Molli Barrows, MD

## 2022-06-06 ENCOUNTER — Encounter: Payer: Self-pay | Admitting: Anesthesiology

## 2022-06-06 ENCOUNTER — Ambulatory Visit: Payer: Commercial Managed Care - PPO | Attending: Anesthesiology | Admitting: Anesthesiology

## 2022-06-06 DIAGNOSIS — M5136 Other intervertebral disc degeneration, lumbar region: Secondary | ICD-10-CM

## 2022-06-06 DIAGNOSIS — M25512 Pain in left shoulder: Secondary | ICD-10-CM

## 2022-06-06 DIAGNOSIS — M25511 Pain in right shoulder: Secondary | ICD-10-CM

## 2022-06-06 DIAGNOSIS — M25532 Pain in left wrist: Secondary | ICD-10-CM

## 2022-06-06 DIAGNOSIS — G8929 Other chronic pain: Secondary | ICD-10-CM

## 2022-06-06 DIAGNOSIS — L4050 Arthropathic psoriasis, unspecified: Secondary | ICD-10-CM

## 2022-06-06 DIAGNOSIS — M5442 Lumbago with sciatica, left side: Secondary | ICD-10-CM

## 2022-06-06 DIAGNOSIS — F1190 Opioid use, unspecified, uncomplicated: Secondary | ICD-10-CM | POA: Diagnosis not present

## 2022-06-06 DIAGNOSIS — M25552 Pain in left hip: Secondary | ICD-10-CM

## 2022-06-06 DIAGNOSIS — G894 Chronic pain syndrome: Secondary | ICD-10-CM | POA: Diagnosis not present

## 2022-06-06 DIAGNOSIS — M5441 Lumbago with sciatica, right side: Secondary | ICD-10-CM

## 2022-06-06 DIAGNOSIS — M533 Sacrococcygeal disorders, not elsewhere classified: Secondary | ICD-10-CM

## 2022-06-06 MED ORDER — OXYCODONE-ACETAMINOPHEN 10-325 MG PO TABS: 1.0000 | ORAL_TABLET | ORAL | 0 refills | Status: DC | PRN

## 2022-06-06 MED ORDER — OXYCODONE-ACETAMINOPHEN 10-325 MG PO TABS: 1.0000 | ORAL_TABLET | ORAL | 0 refills | Status: AC | PRN

## 2022-06-07 NOTE — Progress Notes (Signed)
Virtual Visit via Telephone Note  I connected with Krista Kennedy on 06/07/22 at  1:20 PM EDT by telephone and verified that I am speaking with the correct person using two identifiers.  Location: Patient: Home Provider: Pain control center   I discussed the limitations, risks, security and privacy concerns of performing an evaluation and management service by telephone and the availability of in person appointments. I also discussed with the patient that there may be a patient responsible charge related to this service. The patient expressed understanding and agreed to proceed.   History of Present Illness: I spoke with Krista Kennedy via telephone as we are unable link for the video portion of this conference and she reports that she is doing well.  No recent changes in her symptom complex are noted.  She continues to have chronic pain affecting her back hips buttocks wrists with some radiation into her legs.  This is a chronic long-term complaint with no recent changes noted but generally well remedied with her chronic opioid therapy.  She takes her medications as prescribed and these continue to work well for her.  At present she is taking her oxycodone approximately every 4 hours on average and gets good relief with this rated at 75% improvement overall.  No side effects with the medications are reported.  She maintains that she has been compliant with no evidence of any illicit or diverting use.   Review of systems: General: No fevers or chills Pulmonary: No shortness of breath or dyspnea Cardiac: No angina or palpitations or lightheadedness GI: No abdominal pain or constipation Psych: No depression    Observations/Objective:  Current Outpatient Medications:    [START ON 07/21/2022] oxyCODONE-acetaminophen (PERCOCET) 10-325 MG tablet, Take 1 tablet by mouth every 4 (four) hours as needed for pain., Disp: 135 tablet, Rfl: 0   albuterol (VENTOLIN HFA) 108 (90 Base) MCG/ACT inhaler, Inhale 2 puffs  into the lungs every 6 (six) hours as needed for wheezing or shortness of breath., Disp: 1 each, Rfl: 0   Ascorbic Acid (VITAMIN C) 1000 MG tablet, Take 1,000 mg by mouth daily., Disp: , Rfl:    carvedilol (COREG) 25 MG tablet, Take 1 tablet (25 mg total) by mouth 2 (two) times daily. NO FURTHER REFILLS UNTIL SEEN IN CLINIC. MUST KEEP SCHEDULED APPOINTMENT., Disp: 180 tablet, Rfl: 0   Cholecalciferol 25 MCG (1000 UT) tablet, Take by mouth daily. , Disp: , Rfl:    clobetasol (TEMOVATE) 0.05 % external solution, Apply 1 application topically as needed. , Disp: , Rfl:    estradiol (CLIMARA - DOSED IN MG/24 HR) 0.025 mg/24hr patch, Place 1 patch (0.025 mg total) onto the skin once a week., Disp: 4 patch, Rfl: 0   fluticasone (FLONASE) 50 MCG/ACT nasal spray, Place 2 sprays into both nostrils daily., Disp: 16 g, Rfl: 3   ibuprofen (ADVIL) 600 MG tablet, Take 1 tablet (600 mg total) by mouth every 6 (six) hours as needed., Disp: 30 tablet, Rfl: 0   Ixekizumab 80 MG/ML SOSY, Inject 80 mg into the skin as directed. [facility], Disp: , Rfl:    levothyroxine (SYNTHROID) 100 MCG tablet, Take 1 tablet (100 mcg total) by mouth daily before breakfast. (Patient not taking: Reported on 03/16/2022), Disp: 90 tablet, Rfl: 0   levothyroxine (SYNTHROID) 75 MCG tablet, Take 75 mcg by mouth daily., Disp: , Rfl:    lisinopril (ZESTRIL) 20 MG tablet, TAKE 1 TABLET(20 MG) BY MOUTH DAILY, Disp: 90 tablet, Rfl: 0   Multiple Vitamin (  MULTI VITAMIN PO), Take 1 tablet by mouth daily., Disp: , Rfl:    naloxone (NARCAN) nasal spray 4 mg/0.1 mL, To reverse excess sedation from narcotics, Disp: 2 kit, Rfl: 1   omeprazole (PRILOSEC) 40 MG capsule, Take 1 capsule (40 mg total) by mouth daily., Disp: 30 capsule, Rfl: 0   ondansetron (ZOFRAN-ODT) 8 MG disintegrating tablet, 1/2- 1 tablet q 8 hr prn nausea, vomiting (Patient not taking: Reported on 03/16/2022), Disp: 20 tablet, Rfl: 0   [START ON 06/21/2022] oxyCODONE-acetaminophen  (PERCOCET) 10-325 MG tablet, Take 1 tablet by mouth every 4 (four) hours as needed for pain., Disp: 135 tablet, Rfl: 0   Sod Picosulfate-Mag Ox-Cit Acd (CLENPIQ) 10-3.5-12 MG-GM -GM/160ML SOLN, Take 1 bottle at 5 PM followed by five 8 oz cups of water and repeat 5 hours before procedure. (Patient not taking: Reported on 03/16/2022), Disp: 320 mL, Rfl: 0   spironolactone (ALDACTONE) 25 MG tablet, TAKE 1 TABLET(25 MG) BY MOUTH DAILY, Disp: 30 tablet, Rfl: 0   Past Medical History:  Diagnosis Date   Arthritis    Psoriatic   Cardiomyopathy ([place])    CHF (congestive heart failure) (HCC)    DDD (degenerative disc disease), lumbar 08/02/2021   Hyperlipidemia    Hypertension    Psoriasis    Sciatica of left side 08/02/2021   Thyroid disease    Transient weakness of left leg 08/13/2019   Wears contact lenses      Assessment and Plan: 1. Chronic bilateral low back pain with bilateral sciatica   2. DDD (degenerative disc disease), lumbar   3. Chronic pain syndrome   4. Chronic, continuous use of opioids   5. Chronic pain of both shoulders   6. Chronic left hip pain   7. Psoriatic arthritis ([place])   8. Left wrist pain   9. Chronic sacroiliac joint pain   Based on our conversation today I think it is appropriate to refill her medicines for the next 2 months dated for October 17 and November 16.  No other changes in her pharmacologic regimen will be initiated.  I have reviewed the [facility] practitioner database information and is appropriate for refill.  I have encouraged her to continue with her stretching strengthening exercises and aerobic conditioning.  Continue follow-up with her primary care physician for baseline medical care where return to clinic scheduled in 2 months.  Follow Up Instructions:    I discussed the assessment and treatment plan with the patient. The patient was provided an opportunity to ask questions and all were answered. The patient agreed with the plan and  demonstrated an understanding of the instructions.   The patient was advised to call back or seek an in-person evaluation if the symptoms worsen or if the condition fails to improve as anticipated.  I provided 30 minutes of non-face-to-face time during this encounter.   Molli Barrows, MD

## 2022-08-11 ENCOUNTER — Telehealth: Payer: Commercial Managed Care - PPO | Admitting: Anesthesiology

## 2022-08-14 ENCOUNTER — Emergency Department
Admission: EM | Admit: 2022-08-14 | Discharge: 2022-08-14 | Disposition: A | Discharge status: Home / Self Care | Payer: Commercial Managed Care - PPO | Attending: Emergency Medicine | Admitting: Emergency Medicine

## 2022-08-14 ENCOUNTER — Emergency Department: Payer: Commercial Managed Care - PPO

## 2022-08-14 DIAGNOSIS — U071 COVID-19: Secondary | ICD-10-CM | POA: Diagnosis not present

## 2022-08-14 DIAGNOSIS — R0602 Shortness of breath: Secondary | ICD-10-CM | POA: Diagnosis present

## 2022-08-14 MED ORDER — ALBUTEROL SULFATE HFA 108 (90 BASE) MCG/ACT IN AERS: 2.0000 | INHALATION_SPRAY | RESPIRATORY_TRACT | Status: DC | PRN

## 2022-08-14 NOTE — ED Triage Notes (Signed)
Pt dx with covid on Monday since than pt is having SOB and pain on the left side like she isnt getting enough air.

## 2022-08-15 ENCOUNTER — Ambulatory Visit: Payer: Commercial Managed Care - PPO | Attending: Anesthesiology | Admitting: Anesthesiology

## 2022-08-15 ENCOUNTER — Encounter: Payer: Self-pay | Admitting: Anesthesiology

## 2022-08-15 DIAGNOSIS — M5442 Lumbago with sciatica, left side: Secondary | ICD-10-CM | POA: Diagnosis not present

## 2022-08-15 DIAGNOSIS — M5136 Other intervertebral disc degeneration, lumbar region: Secondary | ICD-10-CM | POA: Diagnosis not present

## 2022-08-15 DIAGNOSIS — F1190 Opioid use, unspecified, uncomplicated: Secondary | ICD-10-CM | POA: Diagnosis not present

## 2022-08-15 DIAGNOSIS — M5441 Lumbago with sciatica, right side: Secondary | ICD-10-CM

## 2022-08-15 DIAGNOSIS — M25512 Pain in left shoulder: Secondary | ICD-10-CM

## 2022-08-15 DIAGNOSIS — G8929 Other chronic pain: Secondary | ICD-10-CM

## 2022-08-15 DIAGNOSIS — M25511 Pain in right shoulder: Secondary | ICD-10-CM

## 2022-08-15 DIAGNOSIS — L4050 Arthropathic psoriasis, unspecified: Secondary | ICD-10-CM

## 2022-08-15 DIAGNOSIS — G894 Chronic pain syndrome: Secondary | ICD-10-CM

## 2022-08-15 DIAGNOSIS — M25552 Pain in left hip: Secondary | ICD-10-CM

## 2022-08-15 DIAGNOSIS — M25532 Pain in left wrist: Secondary | ICD-10-CM

## 2022-08-15 MED ORDER — OXYCODONE-ACETAMINOPHEN 10-325 MG PO TABS: 1.0000 | ORAL_TABLET | ORAL | 0 refills | Status: AC | PRN

## 2022-08-15 NOTE — ED Provider Notes (Signed)
   [facility] Provider Note    Event Date/Time   First MD Initiated Contact with Patient 08/14/22 1815     (approximate)   History   Shortness of Breath   HPI  Krista Kennedy is a 56 y.o. female who was recently diagnosed with COVID-19 who presents with a feeling of left-sided discomfort and some shortness of breath.  She reports she is feeling better now.  Still occasionally has discomfort in her left upper abdomen.  Normal stools.  No vomiting.  No pleurisy reported     Physical Exam   Triage Vital Signs: ED Triage Vitals  Enc Vitals Group     BP 08/14/22 1756 136/72     Pulse Rate 08/14/22 1756 91     Resp 08/14/22 1756 17     Temp 08/14/22 1756 97.8 F (36.6 C)     Temp Source 08/14/22 1756 Oral     SpO2 08/14/22 1756 98 %     Weight 08/14/22 1757 77.1 kg (170 lb)     Height --      Head Circumference --      Peak Flow --      Pain Score 08/14/22 1757 4     Pain Loc --      Pain Edu? --      Excl. in [place]? --     Most recent vital signs: Vitals:   08/14/22 1756  BP: 136/72  Pulse: 91  Resp: 17  Temp: 97.8 F (36.6 C)  SpO2: 98%     General: Awake, no distress.  CV:  Good peripheral perfusion.  Resp:  Normal effort.  Clear to auscultation bilaterally Abd:  No distention.  Mild left upper quadrant tenderness Other:     ED Results / Procedures / Treatments   Labs (all labs ordered are listed, but only abnormal results are displayed) Labs Reviewed - No data to display   EKG  ED ECG REPORT I, Lavonia Drafts, the attending physician, personally viewed and interpreted this ECG.  Date: 08/15/2022  Rhythm: normal sinus rhythm QRS Axis: normal Intervals: Prolonged QT ST/T Wave abnormalities: normal Narrative Interpretation: no evidence of acute ischemia    RADIOLOGY Chest x-ray viewed interpreted by me, no pneumonia    PROCEDURES:  Critical Care performed:   Procedures   MEDICATIONS ORDERED IN ED: Medications  - No data to display   IMPRESSION / MDM / [place] / ED COURSE  I reviewed the triage vital signs and the nursing notes. Patient's presentation is most consistent with acute illness / injury with system symptoms.   Patient presents with feeling of shortness of breath and discomfort as above.  Differential includes pneumonia, colitis, pleurisy, PE  X-ray not consistent with pneumonia discussed with patient that we would need to obtain CT angiography to rule out PE however she would prefer to try over-the-counter pain medications first before proceeding with a CT scan, she notes that she can return as needed, she will follow-up with her PCP as well       FINAL CLINICAL IMPRESSION(S) / ED DIAGNOSES   Final diagnoses:  COVID-19     Rx / DC Orders   ED Discharge Orders     None        Note:  This document was prepared using Dragon voice recognition software and may include unintentional dictation errors.   Lavonia Drafts, MD 08/15/22 1321

## 2022-08-15 NOTE — Progress Notes (Signed)
Virtual Visit via Telephone Note  I connected with Krista Kennedy on 08/15/22 at  3:20 PM EST by telephone and verified that I am speaking with the correct person using two identifiers.  Location: Patient: Home Provider: Pain control center   I discussed the limitations, risks, security and privacy concerns of performing an evaluation and management service by telephone and the availability of in person appointments. I also discussed with the patient that there may be a patient responsible charge related to this service. The patient expressed understanding and agreed to proceed.   History of Present Illness: I spoke with Krista Kennedy via telephone as we were unable like for the video portion of the conference.  She reports that her low back pain and lower extremity pain in addition to diffuse body pain from her rheumatologic conditions are stable in nature.  She continues to take her medications as prescribed and she has been on these chronically.  No side effects are reported and she continues to derive good functional lifestyle improvement with the medications.  She has failed more conservative therapy.  She takes her medications as prescribed and denies any diverting or illicit use.  She generally gets 75 to 80% relief taking these about every 4-6 hours averaging about 4 to 5 tablets/day.  Otherwise she is in her usual state of health.  Review of systems: General: No fevers or chills Pulmonary: No shortness of breath or dyspnea Cardiac: No angina or palpitations or lightheadedness GI: No abdominal pain or constipation Psych: No depression    Observations/Objective:  Current Outpatient Medications:    [START ON 09/19/2022] oxyCODONE-acetaminophen (PERCOCET) 10-325 MG tablet, Take 1 tablet by mouth every 4 (four) hours as needed for pain., Disp: 135 tablet, Rfl: 0   albuterol (VENTOLIN HFA) 108 (90 Base) MCG/ACT inhaler, Inhale 2 puffs into the lungs every 6 (six) hours as needed for wheezing or  shortness of breath., Disp: 1 each, Rfl: 0   Ascorbic Acid (VITAMIN C) 1000 MG tablet, Take 1,000 mg by mouth daily., Disp: , Rfl:    carvedilol (COREG) 25 MG tablet, Take 1 tablet (25 mg total) by mouth 2 (two) times daily. NO FURTHER REFILLS UNTIL SEEN IN CLINIC. MUST KEEP SCHEDULED APPOINTMENT., Disp: 180 tablet, Rfl: 0   Cholecalciferol 25 MCG (1000 UT) tablet, Take by mouth daily. , Disp: , Rfl:    clobetasol (TEMOVATE) 0.05 % external solution, Apply 1 application topically as needed. , Disp: , Rfl:    estradiol (CLIMARA - DOSED IN MG/24 HR) 0.025 mg/24hr patch, Place 1 patch (0.025 mg total) onto the skin once a week., Disp: 4 patch, Rfl: 0   fluticasone (FLONASE) 50 MCG/ACT nasal spray, Place 2 sprays into both nostrils daily., Disp: 16 g, Rfl: 3   ibuprofen (ADVIL) 600 MG tablet, Take 1 tablet (600 mg total) by mouth every 6 (six) hours as needed., Disp: 30 tablet, Rfl: 0   Ixekizumab 80 MG/ML SOSY, Inject 80 mg into the skin as directed. [facility], Disp: , Rfl:    levothyroxine (SYNTHROID) 100 MCG tablet, Take 1 tablet (100 mcg total) by mouth daily before breakfast. (Patient not taking: Reported on 03/16/2022), Disp: 90 tablet, Rfl: 0   levothyroxine (SYNTHROID) 75 MCG tablet, Take 75 mcg by mouth daily., Disp: , Rfl:    lisinopril (ZESTRIL) 20 MG tablet, TAKE 1 TABLET(20 MG) BY MOUTH DAILY, Disp: 90 tablet, Rfl: 0   Multiple Vitamin (MULTI VITAMIN PO), Take 1 tablet by mouth daily., Disp: , Rfl:  naloxone (NARCAN) nasal spray 4 mg/0.1 mL, To reverse excess sedation from narcotics, Disp: 2 kit, Rfl: 1   omeprazole (PRILOSEC) 40 MG capsule, Take 1 capsule (40 mg total) by mouth daily., Disp: 30 capsule, Rfl: 0   ondansetron (ZOFRAN-ODT) 8 MG disintegrating tablet, 1/2- 1 tablet q 8 hr prn nausea, vomiting (Patient not taking: Reported on 03/16/2022), Disp: 20 tablet, Rfl: 0   [START ON 08/20/2022] oxyCODONE-acetaminophen (PERCOCET) 10-325 MG tablet, Take 1 tablet by mouth every 4 (four)  hours as needed for pain., Disp: 135 tablet, Rfl: 0   Sod Picosulfate-Mag Ox-Cit Acd (CLENPIQ) 10-3.5-12 MG-GM -GM/160ML SOLN, Take 1 bottle at 5 PM followed by five 8 oz cups of water and repeat 5 hours before procedure. (Patient not taking: Reported on 03/16/2022), Disp: 320 mL, Rfl: 0   spironolactone (ALDACTONE) 25 MG tablet, TAKE 1 TABLET(25 MG) BY MOUTH DAILY, Disp: 30 tablet, Rfl: 0   Past Medical History:  Diagnosis Date   Arthritis    Psoriatic   Cardiomyopathy ([place])    CHF (congestive heart failure) (HCC)    DDD (degenerative disc disease), lumbar 08/02/2021   Hyperlipidemia    Hypertension    Psoriasis    Sciatica of left side 08/02/2021   Thyroid disease    Transient weakness of left leg 08/13/2019   Wears contact lenses      Assessment and Plan:  1. Chronic bilateral low back pain with bilateral sciatica   2. DDD (degenerative disc disease), lumbar   3. Chronic pain syndrome   4. Chronic, continuous use of opioids   5. Chronic pain of both shoulders   6. Chronic left hip pain   7. Psoriatic arthritis ([place])   8. Left wrist pain   Based on our conversation today and after review of the [facility] practitioner database information I think it is appropriate for refills for medications.  These will be prescription for December 16 and January 15 with a schedule return to clinic in 2 months.  Continue follow-up with her primary care physicians for baseline medical care.  Continue efforts at stretching strengthening and core training. Follow Up Instructions:    I discussed the assessment and treatment plan with the patient. The patient was provided an opportunity to ask questions and all were answered. The patient agreed with the plan and demonstrated an understanding of the instructions.   The patient was advised to call back or seek an in-person evaluation if the symptoms worsen or if the condition fails to improve as anticipated.  I provided 30 minutes of  non-face-to-face time during this encounter.   Molli Barrows, MD

## 2022-10-17 IMAGING — CR DG CHEST 2V
1 series · 2 of 2 positions shown · non-contrast
Comparison: March 06, 2020.

CLINICAL DATA: Shortness of breath.

EXAM:
CHEST - 2 VIEW

[Series 1: dg chest 2 view · 0.14mm/px · 2 of 2 slices shown]
[im 1/2]
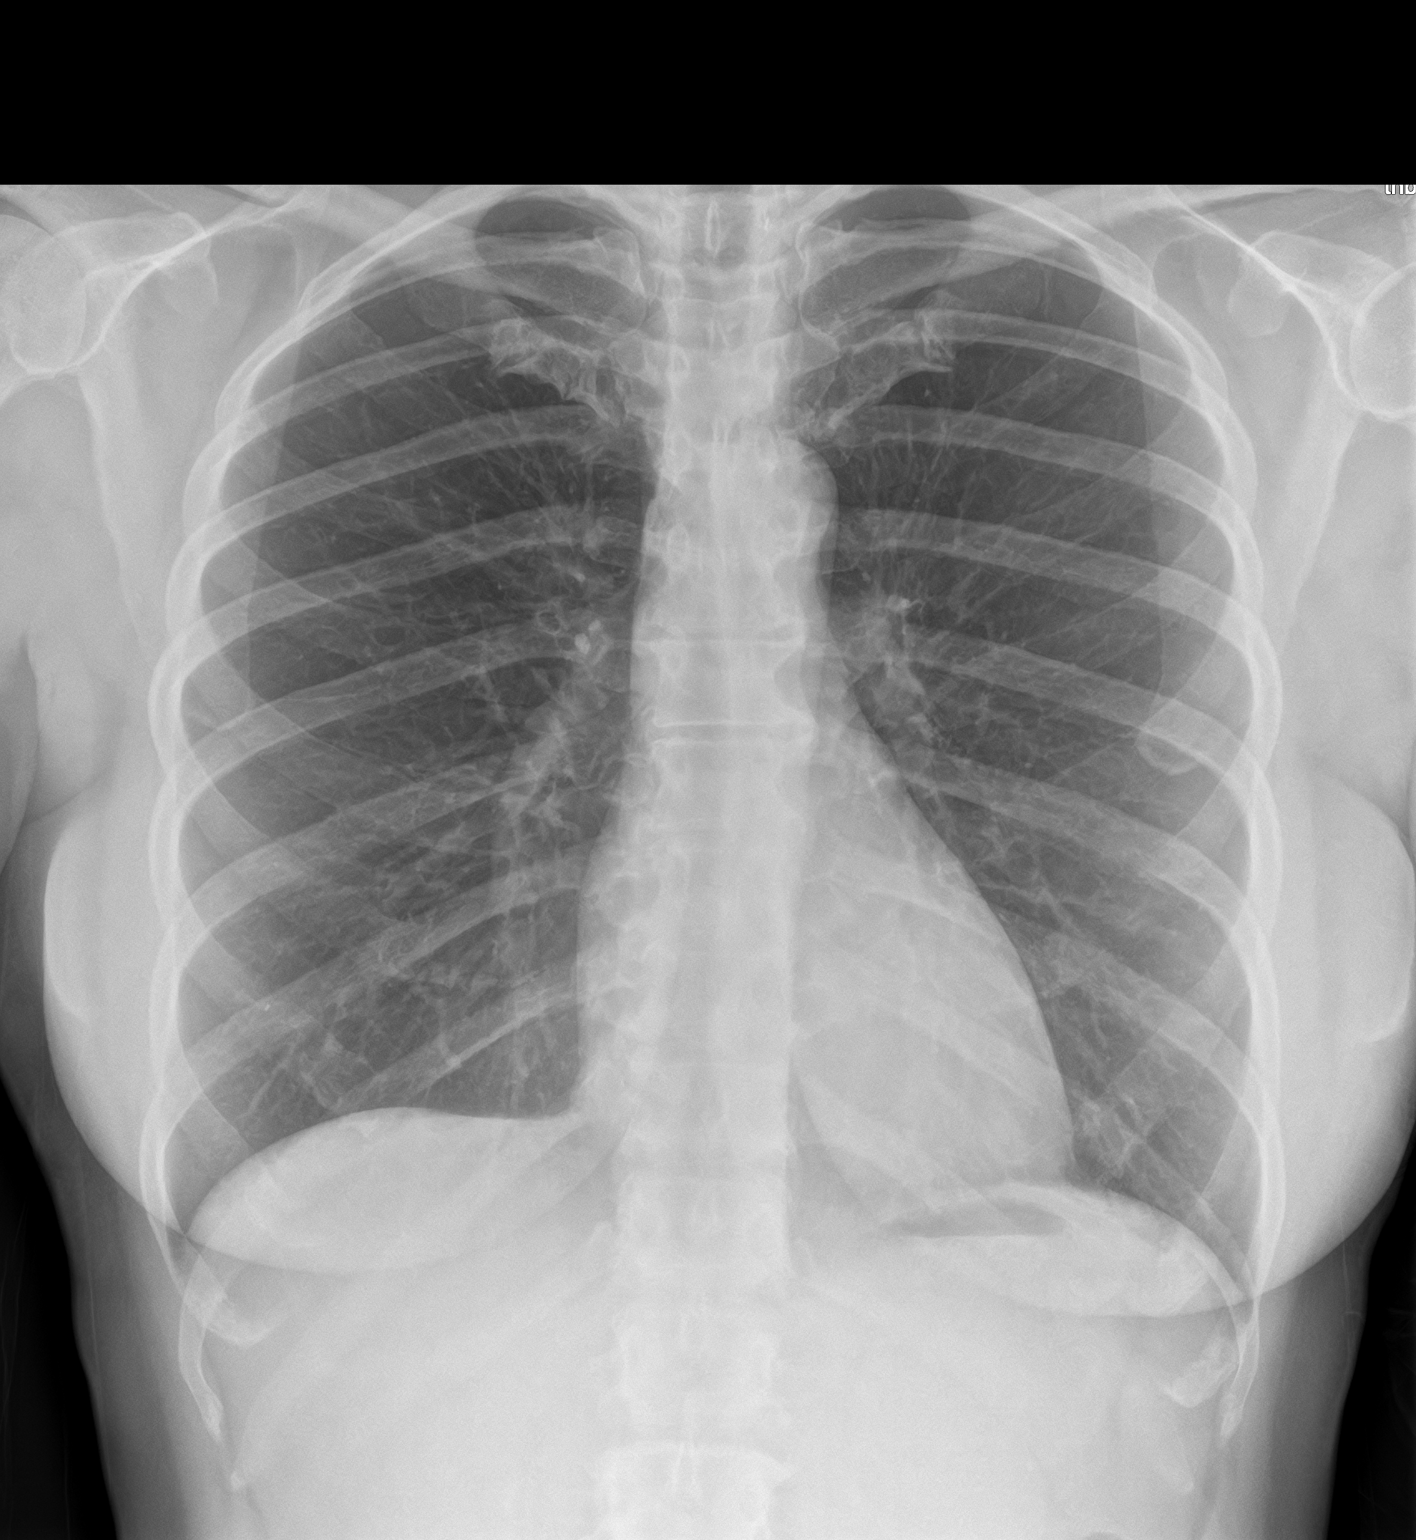
[im 2/2]
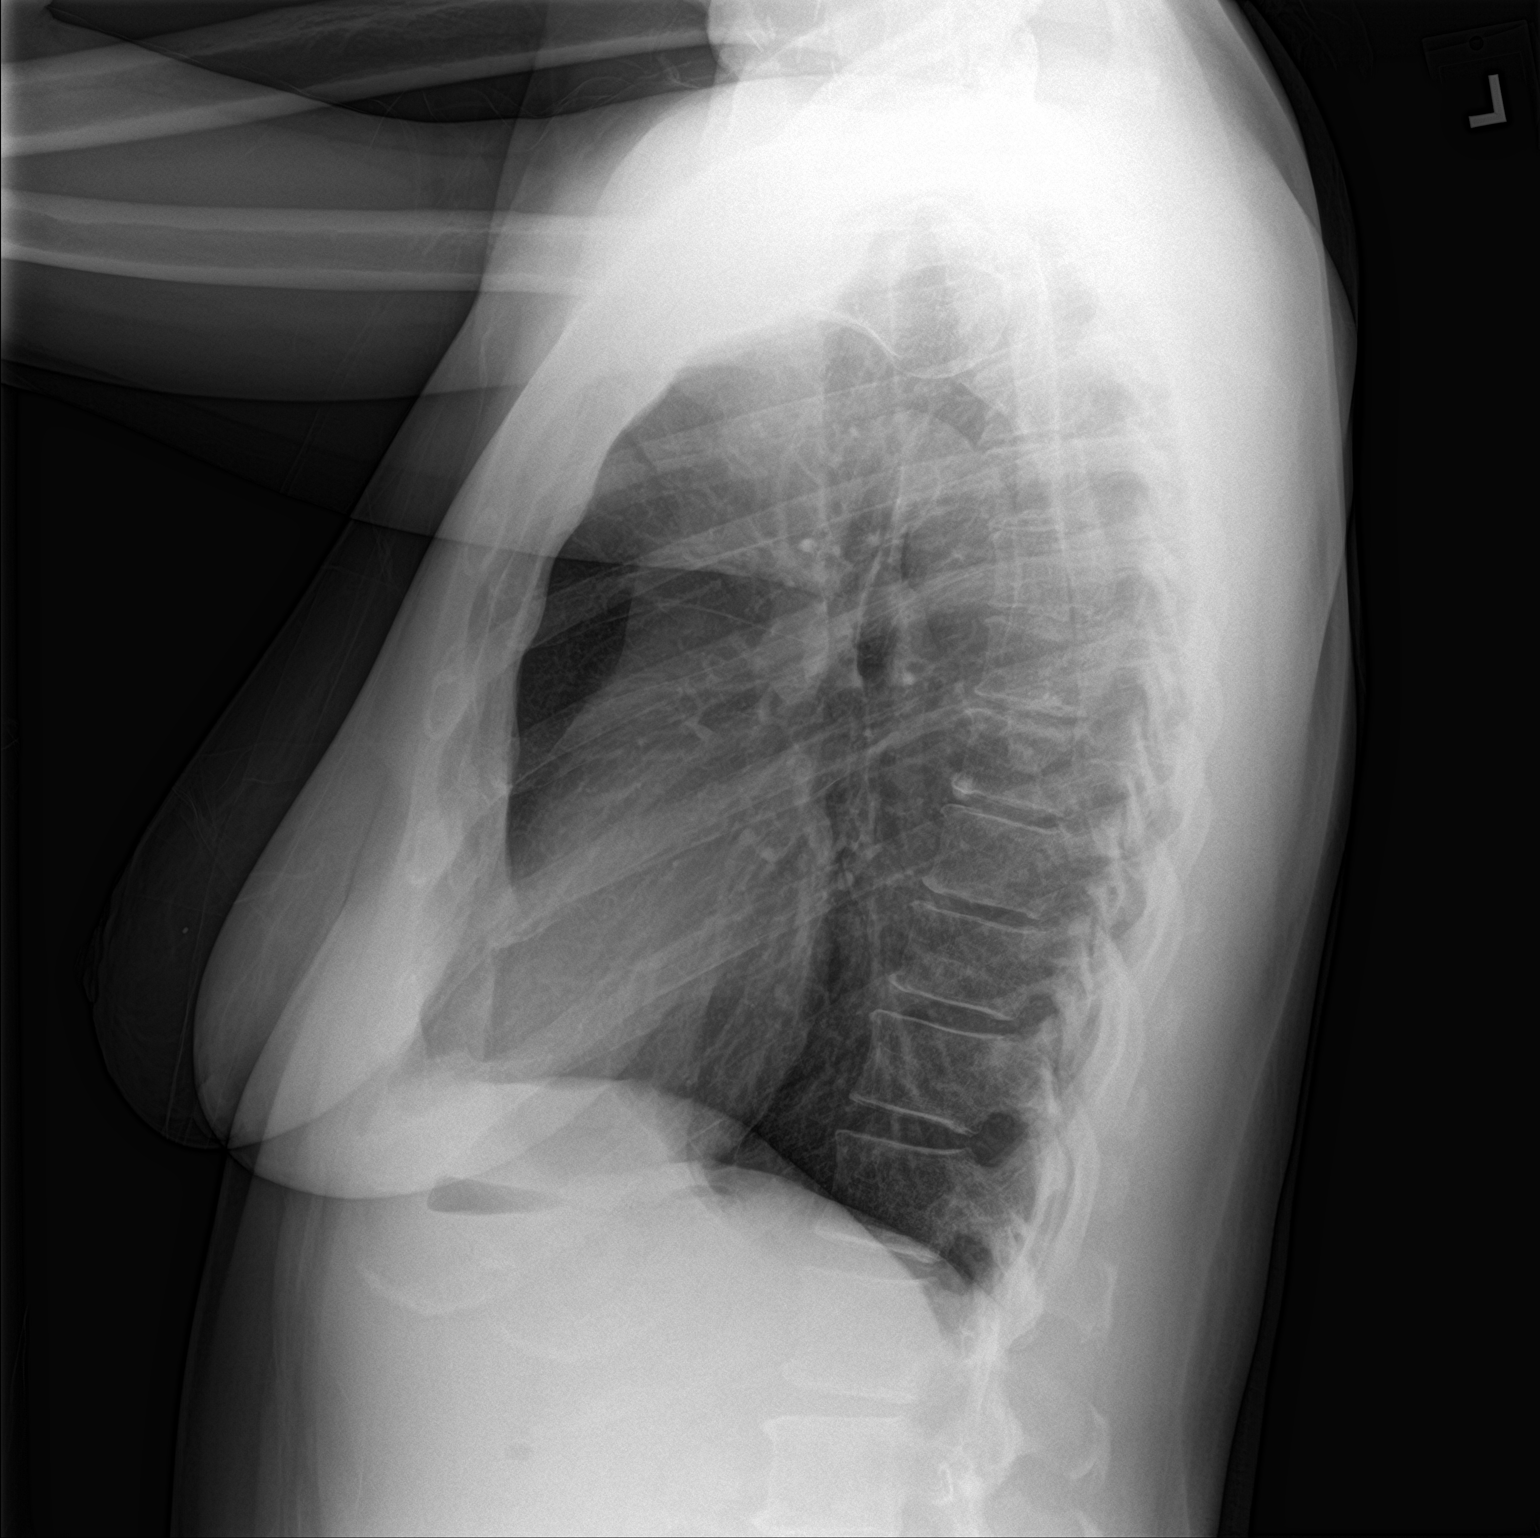

[2 of 2 positions shown; findings below may reference images not displayed]

FINDINGS: The heart size and mediastinal contours are within normal limits.
Both lungs are clear. No pneumothorax or pleural effusion is noted.
The visualized skeletal structures are unremarkable.
IMPRESSION: No active cardiopulmonary disease.

## 2022-10-25 ENCOUNTER — Ambulatory Visit: Payer: Commercial Managed Care - PPO | Attending: Anesthesiology | Admitting: Anesthesiology

## 2022-10-25 ENCOUNTER — Encounter: Payer: Self-pay | Admitting: Anesthesiology

## 2022-10-25 DIAGNOSIS — L4050 Arthropathic psoriasis, unspecified: Secondary | ICD-10-CM

## 2022-10-25 DIAGNOSIS — M5442 Lumbago with sciatica, left side: Secondary | ICD-10-CM

## 2022-10-25 DIAGNOSIS — M5441 Lumbago with sciatica, right side: Secondary | ICD-10-CM | POA: Diagnosis not present

## 2022-10-25 DIAGNOSIS — G8929 Other chronic pain: Secondary | ICD-10-CM | POA: Diagnosis not present

## 2022-10-25 DIAGNOSIS — M5136 Other intervertebral disc degeneration, lumbar region: Secondary | ICD-10-CM

## 2022-10-25 DIAGNOSIS — M25532 Pain in left wrist: Secondary | ICD-10-CM

## 2022-10-25 DIAGNOSIS — F1190 Opioid use, unspecified, uncomplicated: Secondary | ICD-10-CM

## 2022-10-25 DIAGNOSIS — G894 Chronic pain syndrome: Secondary | ICD-10-CM

## 2022-10-25 MED ORDER — OXYCODONE-ACETAMINOPHEN 10-325 MG PO TABS: 1.0000 | ORAL_TABLET | ORAL | 0 refills | Status: DC | PRN

## 2022-10-25 MED ORDER — OXYCODONE-ACETAMINOPHEN 10-325 MG PO TABS: 1.0000 | ORAL_TABLET | ORAL | 0 refills | Status: AC | PRN

## 2022-10-25 NOTE — Progress Notes (Signed)
Virtual Visit via Telephone Note  I connected with Krista Kennedy on 10/25/22 at  9:30 AM EST by telephone and verified that I am speaking with the correct person using two identifiers.  Location: Patient: Home Provider: Pain control center   I discussed the limitations, risks, security and privacy concerns of performing an evaluation and management service by telephone and the availability of in person appointments. I also discussed with the patient that there may be a patient responsible charge related to this service. The patient expressed understanding and agreed to proceed.   History of Present Illness: I spoke with [name] via telephone as we were unable link for the video portion of the conference.  She reports that her diffuse pain associated with her chronic pain syndrome is stable in nature with no recent changes.  She continues to take her Percocet 06/07/2024 about every 4-6 hours.  She averages 135/month and this recent change in frequency is helping significantly as reported today.  She denies any side effects with the medication.  She generally gets about 4 hours of relief before she has recurrence of this typical pain consistent with what she has experienced before in her fingers hips back and lower extremities.  The medications help her stay functional and active with no change in lower extremity strength function or bowel or bladder function.  No side effects with the medicines are reported.  She seems to be doing well.  Review of systems: General: No fevers or chills Pulmonary: No shortness of breath or dyspnea Cardiac: No angina or palpitations or lightheadedness GI: No abdominal pain or constipation Psych: No depression    Observations/Objective:   Current Outpatient Medications:    [START ON 11/20/2022] oxyCODONE-acetaminophen (PERCOCET) 10-325 MG tablet, Take 1 tablet by mouth every 4 (four) hours as needed for pain., Disp: 135 tablet, Rfl: 0   [START ON 12/20/2022]  oxyCODONE-acetaminophen (PERCOCET) 10-325 MG tablet, Take 1 tablet by mouth every 4 (four) hours as needed for pain., Disp: 135 tablet, Rfl: 0   albuterol (VENTOLIN HFA) 108 (90 Base) MCG/ACT inhaler, Inhale 2 puffs into the lungs every 6 (six) hours as needed for wheezing or shortness of breath., Disp: 1 each, Rfl: 0   Ascorbic Acid (VITAMIN C) 1000 MG tablet, Take 1,000 mg by mouth daily., Disp: , Rfl:    carvedilol (COREG) 25 MG tablet, Take 1 tablet (25 mg total) by mouth 2 (two) times daily. NO FURTHER REFILLS UNTIL SEEN IN CLINIC. MUST KEEP SCHEDULED APPOINTMENT., Disp: 180 tablet, Rfl: 0   Cholecalciferol 25 MCG (1000 UT) tablet, Take by mouth daily. , Disp: , Rfl:    clobetasol (TEMOVATE) 0.05 % external solution, Apply 1 application topically as needed. , Disp: , Rfl:    estradiol (CLIMARA - DOSED IN MG/24 HR) 0.025 mg/24hr patch, Place 1 patch (0.025 mg total) onto the skin once a week., Disp: 4 patch, Rfl: 0   fluticasone (FLONASE) 50 MCG/ACT nasal spray, Place 2 sprays into both nostrils daily., Disp: 16 g, Rfl: 3   ibuprofen (ADVIL) 600 MG tablet, Take 1 tablet (600 mg total) by mouth every 6 (six) hours as needed., Disp: 30 tablet, Rfl: 0   Ixekizumab 80 MG/ML SOSY, Inject 80 mg into the skin as directed. [facility], Disp: , Rfl:    levothyroxine (SYNTHROID) 100 MCG tablet, Take 1 tablet (100 mcg total) by mouth daily before breakfast. (Patient not taking: Reported on 03/16/2022), Disp: 90 tablet, Rfl: 0   levothyroxine (SYNTHROID) 75 MCG tablet, Take  75 mcg by mouth daily., Disp: , Rfl:    lisinopril (ZESTRIL) 20 MG tablet, TAKE 1 TABLET(20 MG) BY MOUTH DAILY, Disp: 90 tablet, Rfl: 0   Multiple Vitamin (MULTI VITAMIN PO), Take 1 tablet by mouth daily., Disp: , Rfl:    naloxone (NARCAN) nasal spray 4 mg/0.1 mL, To reverse excess sedation from narcotics, Disp: 2 kit, Rfl: 1   omeprazole (PRILOSEC) 40 MG capsule, Take 1 capsule (40 mg total) by mouth daily., Disp: 30 capsule, Rfl: 0    ondansetron (ZOFRAN-ODT) 8 MG disintegrating tablet, 1/2- 1 tablet q 8 hr prn nausea, vomiting (Patient not taking: Reported on 03/16/2022), Disp: 20 tablet, Rfl: 0   Sod Picosulfate-Mag Ox-Cit Acd (CLENPIQ) 10-3.5-12 MG-GM -GM/160ML SOLN, Take 1 bottle at 5 PM followed by five 8 oz cups of water and repeat 5 hours before procedure. (Patient not taking: Reported on 03/16/2022), Disp: 320 mL, Rfl: 0   spironolactone (ALDACTONE) 25 MG tablet, TAKE 1 TABLET(25 MG) BY MOUTH DAILY, Disp: 30 tablet, Rfl: 0   Past Medical History:  Diagnosis Date   Arthritis    Psoriatic   Cardiomyopathy ([place])    CHF (congestive heart failure) (HCC)    DDD (degenerative disc disease), lumbar 08/02/2021   Hyperlipidemia    Hypertension    Psoriasis    Sciatica of left side 08/02/2021   Thyroid disease    Transient weakness of left leg 08/13/2019   Wears contact lenses     Assessment and Plan:  1. Chronic bilateral low back pain with bilateral sciatica   2. DDD (degenerative disc disease), lumbar   3. Chronic pain syndrome   4. Chronic, continuous use of opioids   5. Chronic pain of both shoulders   6. Chronic left hip pain   7. Chronic sacroiliac joint pain   8. Left wrist pain   9. Psoriatic arthritis ([place])   10. Psoriasis with arthropathy ([place])   Based on our discussion today I think it is appropriate to continue her current medication therapy as she is responding favorably.  Refills will be dated from March 17 and [place] 16.  I have her use the [facility] and it is appropriate for refill.  She denies any problems with her medication and continues to respond to them appropriately with good functional lifestyle improvement.  She has failed more conservative therapy.  No side effects reported.  Continue stretching strengthening exercises as reviewed with return to clinic in 2 months and continue follow-up with her primary care physicians for baseline medical care. Follow Up Instructions:    I  discussed the assessment and treatment plan with the patient. The patient was provided an opportunity to ask questions and all were answered. The patient agreed with the plan and demonstrated an understanding of the instructions.   The patient was advised to call back or seek an in-person evaluation if the symptoms worsen or if the condition fails to improve as anticipated.  I provided 30 minutes of non-face-to-face time during this encounter.   Molli Barrows, MD

## 2023-01-10 ENCOUNTER — Ambulatory Visit: Payer: Commercial Managed Care - PPO | Attending: Anesthesiology | Admitting: Anesthesiology

## 2023-01-10 DIAGNOSIS — M25511 Pain in right shoulder: Secondary | ICD-10-CM

## 2023-01-10 DIAGNOSIS — M5441 Lumbago with sciatica, right side: Secondary | ICD-10-CM | POA: Diagnosis not present

## 2023-01-10 DIAGNOSIS — G894 Chronic pain syndrome: Secondary | ICD-10-CM | POA: Diagnosis not present

## 2023-01-10 DIAGNOSIS — G8929 Other chronic pain: Secondary | ICD-10-CM

## 2023-01-10 DIAGNOSIS — M51369 Other intervertebral disc degeneration, lumbar region without mention of lumbar back pain or lower extremity pain: Secondary | ICD-10-CM

## 2023-01-10 DIAGNOSIS — M533 Sacrococcygeal disorders, not elsewhere classified: Secondary | ICD-10-CM

## 2023-01-10 DIAGNOSIS — M5136 Other intervertebral disc degeneration, lumbar region: Secondary | ICD-10-CM

## 2023-01-10 DIAGNOSIS — M25512 Pain in left shoulder: Secondary | ICD-10-CM

## 2023-01-10 DIAGNOSIS — L4050 Arthropathic psoriasis, unspecified: Secondary | ICD-10-CM

## 2023-01-10 DIAGNOSIS — M25532 Pain in left wrist: Secondary | ICD-10-CM

## 2023-01-10 DIAGNOSIS — M5442 Lumbago with sciatica, left side: Secondary | ICD-10-CM | POA: Diagnosis not present

## 2023-01-10 DIAGNOSIS — M25552 Pain in left hip: Secondary | ICD-10-CM

## 2023-01-10 DIAGNOSIS — F1190 Opioid use, unspecified, uncomplicated: Secondary | ICD-10-CM

## 2023-01-10 MED ORDER — OXYCODONE-ACETAMINOPHEN 10-325 MG PO TABS: 1.0000 | ORAL_TABLET | ORAL | 0 refills | Status: DC | PRN

## 2023-01-10 MED ORDER — OXYCODONE-ACETAMINOPHEN 10-325 MG PO TABS: 1.0000 | ORAL_TABLET | ORAL | 0 refills | Status: AC | PRN

## 2023-01-10 NOTE — Progress Notes (Signed)
Virtual Visit via Telephone Note  I connected with Krista Kennedy on 01/10/23 at  8:45 AM EDT by telephone and verified that I am speaking with the correct person using two identifiers.  Location: Patient: Home Provider: [facility] control center   I discussed the limitations, risks, security and privacy concerns of performing an evaluation and management service by telephone and the availability of in person appointments. I also discussed with the patient that there may be a patient responsible charge related to this service. The patient expressed understanding and agreed to proceed.   History of Present Illness: I spoke with Krista Kennedy via telephone as we were unable link for the video portion conference.  She reports that she is doing well with her back lower leg pain hip pain and diffuse body pain secondary to her rheumatoid arthritis.  She is doing well with the current opioid regimen taking her medications about every 4 hours on average averaging about 135 tablets/month.  She gets good relief rated about 75 to 80% relief where she has failed more conservative therapy.  She has been on chronic opioid therapy secondary to chronic pain and this is working well for her.  The medications enable her to stay functional active and complete her daily routine tasks.  No side effects with the medications are reported.  No change in the quality characteristic or distribution of the pain is noted either.  Review of systems: General: No fevers or chills Pulmonary: No shortness of breath or dyspnea Cardiac: No angina or palpitations or lightheadedness GI: No abdominal pain or constipation Psych: No depression    Observations/Objective:  Current Outpatient Medications:    albuterol (VENTOLIN HFA) 108 (90 Base) MCG/ACT inhaler, Inhale 2 puffs into the lungs every 6 (six) hours as needed for wheezing or shortness of breath., Disp: 1 each, Rfl: 0   Ascorbic Acid (VITAMIN C) 1000 MG tablet, Take 1,000 mg by mouth  daily., Disp: , Rfl:    carvedilol (COREG) 25 MG tablet, Take 1 tablet (25 mg total) by mouth 2 (two) times daily. NO FURTHER REFILLS UNTIL SEEN IN CLINIC. MUST KEEP SCHEDULED APPOINTMENT., Disp: 180 tablet, Rfl: 0   Cholecalciferol 25 MCG (1000 UT) tablet, Take by mouth daily. , Disp: , Rfl:    clobetasol (TEMOVATE) 0.05 % external solution, Apply 1 application topically as needed. , Disp: , Rfl:    estradiol (CLIMARA - DOSED IN MG/24 HR) 0.025 mg/24hr patch, Place 1 patch (0.025 mg total) onto the skin once a week., Disp: 4 patch, Rfl: 0   fluticasone (FLONASE) 50 MCG/ACT nasal spray, Place 2 sprays into both nostrils daily., Disp: 16 g, Rfl: 3   ibuprofen (ADVIL) 600 MG tablet, Take 1 tablet (600 mg total) by mouth every 6 (six) hours as needed., Disp: 30 tablet, Rfl: 0   Ixekizumab 80 MG/ML SOSY, Inject 80 mg into the skin as directed. [facility], Disp: , Rfl:    levothyroxine (SYNTHROID) 100 MCG tablet, Take 1 tablet (100 mcg total) by mouth daily before breakfast. (Patient not taking: Reported on 03/16/2022), Disp: 90 tablet, Rfl: 0   levothyroxine (SYNTHROID) 75 MCG tablet, Take 75 mcg by mouth daily., Disp: , Rfl:    lisinopril (ZESTRIL) 20 MG tablet, TAKE 1 TABLET(20 MG) BY MOUTH DAILY, Disp: 90 tablet, Rfl: 0   Multiple Vitamin (MULTI VITAMIN PO), Take 1 tablet by mouth daily., Disp: , Rfl:    naloxone (NARCAN) nasal spray 4 mg/0.1 mL, To reverse excess sedation from narcotics, Disp: 2 kit,  Rfl: 1   omeprazole (PRILOSEC) 40 MG capsule, Take 1 capsule (40 mg total) by mouth daily., Disp: 30 capsule, Rfl: 0   ondansetron (ZOFRAN-ODT) 8 MG disintegrating tablet, 1/2- 1 tablet q 8 hr prn nausea, vomiting (Patient not taking: Reported on 03/16/2022), Disp: 20 tablet, Rfl: 0   [START ON 01/19/2023] oxyCODONE-acetaminophen (PERCOCET) 10-325 MG tablet, Take 1 tablet by mouth every 4 (four) hours as needed for pain., Disp: 135 tablet, Rfl: 0   [START ON 02/18/2023] oxyCODONE-acetaminophen (PERCOCET)  10-325 MG tablet, Take 1 tablet by mouth every 4 (four) hours as needed for pain., Disp: 135 tablet, Rfl: 0   Sod Picosulfate-Mag Ox-Cit Acd (CLENPIQ) 10-3.5-12 MG-GM -GM/160ML SOLN, Take 1 bottle at 5 PM followed by five 8 oz cups of water and repeat 5 hours before procedure. (Patient not taking: Reported on 03/16/2022), Disp: 320 mL, Rfl: 0   spironolactone (ALDACTONE) 25 MG tablet, TAKE 1 TABLET(25 MG) BY MOUTH DAILY, Disp: 30 tablet, Rfl: 0   Past Medical History:  Diagnosis Date   Arthritis    Psoriatic   Cardiomyopathy (HCC)    CHF (congestive heart failure) (HCC)    DDD (degenerative disc disease), lumbar 08/02/2021   Hyperlipidemia    Hypertension    Psoriasis    Sciatica of left side 08/02/2021   Thyroid disease    Transient weakness of left leg 08/13/2019   Wears contact lenses      Assessment and Plan: 1. Chronic bilateral low back pain with bilateral sciatica   2. DDD (degenerative disc disease), lumbar   3. Chronic pain syndrome   4. Chronic, continuous use of opioids   5. Chronic pain of both shoulders   6. Chronic left hip pain   7. Chronic sacroiliac joint pain   8. Left wrist pain   9. Psoriatic arthritis (HCC)   I have reviewed the [facility] practitioner database information and it is appropriate for refill.  She continues to do well with chronic opioid therapy.  It is enabling her to stay active functional and enjoy daily activity.  She has failed more conservative therapy.  Refills will be generated for May 16 and June 15.  No other changes in her regimen will be initiated.  I encouraged her to continue follow-up with her primary care physicians for baseline medical care and her rheumatologist.  Will schedule her for return to clinic in 2 months.  Follow Up Instructions:    I discussed the assessment and treatment plan with the patient. The patient was provided an opportunity to ask questions and all were answered. The patient agreed with the plan and  demonstrated an understanding of the instructions.   The patient was advised to call back or seek an in-person evaluation if the symptoms worsen or if the condition fails to improve as anticipated.  I provided 30 minutes of non-face-to-face time during this encounter.   Yevette Edwards, MD

## 2023-02-14 ENCOUNTER — Other Ambulatory Visit: Payer: Self-pay

## 2023-03-02 ENCOUNTER — Telehealth: Payer: Commercial Managed Care - PPO | Admitting: Anesthesiology

## 2023-03-06 ENCOUNTER — Ambulatory Visit: Payer: Commercial Managed Care - PPO | Attending: Anesthesiology | Admitting: Anesthesiology

## 2023-03-06 ENCOUNTER — Telehealth: Payer: Commercial Managed Care - PPO | Admitting: Anesthesiology

## 2023-03-06 ENCOUNTER — Encounter: Payer: Self-pay | Admitting: Anesthesiology

## 2023-03-06 DIAGNOSIS — G8929 Other chronic pain: Secondary | ICD-10-CM

## 2023-03-06 DIAGNOSIS — G894 Chronic pain syndrome: Secondary | ICD-10-CM

## 2023-03-06 DIAGNOSIS — M533 Sacrococcygeal disorders, not elsewhere classified: Secondary | ICD-10-CM

## 2023-03-06 DIAGNOSIS — M5441 Lumbago with sciatica, right side: Secondary | ICD-10-CM

## 2023-03-06 DIAGNOSIS — M5136 Other intervertebral disc degeneration, lumbar region: Secondary | ICD-10-CM | POA: Diagnosis not present

## 2023-03-06 DIAGNOSIS — M25532 Pain in left wrist: Secondary | ICD-10-CM

## 2023-03-06 DIAGNOSIS — M25512 Pain in left shoulder: Secondary | ICD-10-CM

## 2023-03-06 DIAGNOSIS — M25511 Pain in right shoulder: Secondary | ICD-10-CM

## 2023-03-06 DIAGNOSIS — M25552 Pain in left hip: Secondary | ICD-10-CM

## 2023-03-06 DIAGNOSIS — M5442 Lumbago with sciatica, left side: Secondary | ICD-10-CM

## 2023-03-06 DIAGNOSIS — F1190 Opioid use, unspecified, uncomplicated: Secondary | ICD-10-CM

## 2023-03-06 DIAGNOSIS — M25549 Pain in joints of unspecified hand: Secondary | ICD-10-CM

## 2023-03-06 DIAGNOSIS — L4050 Arthropathic psoriasis, unspecified: Secondary | ICD-10-CM

## 2023-03-06 MED ORDER — OXYCODONE-ACETAMINOPHEN 10-325 MG PO TABS: 1.0000 | ORAL_TABLET | ORAL | 0 refills | Status: DC | PRN

## 2023-03-06 MED ORDER — OXYCODONE-ACETAMINOPHEN 10-325 MG PO TABS: 1.0000 | ORAL_TABLET | ORAL | 0 refills | Status: AC | PRN

## 2023-03-06 NOTE — Progress Notes (Signed)
Safety precautions to be maintained throughout the outpatient stay will include: orient to surroundings, keep bed in low position, maintain call bell within reach at all times, provide assistance with transfer out of bed and ambulation.  

## 2023-03-06 NOTE — Progress Notes (Signed)
Virtual Visit via Telephone Note  I connected with Krista Kennedy on 03/06/23 at  4:20 PM EDT by telephone and verified that I am speaking with the correct person using two identifiers.  Location: Patient: Home Provider: Pain control center   I discussed the limitations, risks, security and privacy concerns of performing an evaluation and management service by telephone and the availability of in person appointments. I also discussed with the patient that there may be a patient responsible charge related to this service. The patient expressed understanding and agreed to proceed.   History of Present Illness: I spoke with Krista Kennedy via telephone as we were unable like for the video portion conference.  She reports that she is having a recent exacerbation of her low back pain similar in quality characteristic to what she has traditionally felt.  She denies any radiation into the hips buttocks or down the legs.  Describes it as a burning aching gnawing pain probably worse because of some recent increase in activity.  Otherwise she is in her usual state of health.  She takes her medications as prescribed and has been on chronic opioid therapy for a considerable period of time.  She still taking the Percocet 06/07/2024 approximately every 4 hours for total of 135/month.  This has worked well for her.  She reports about 75 to 80% relief where she has failed more conservative therapy.  She also has diffuse osteoarthritis and rheumatoid arthritis.  It helps with all of these conditions without side effect.  Otherwise she is doing well as reported today.  Review of systems: General: No fevers or chills Pulmonary: No shortness of breath or dyspnea Cardiac: No angina or palpitations or lightheadedness GI: No abdominal pain or constipation Psych: No depression    Observations/Objective:  Current Outpatient Medications:    [START ON 04/19/2023] oxyCODONE-acetaminophen (PERCOCET) 10-325 MG tablet, Take 1 tablet  by mouth every 4 (four) hours as needed for pain., Disp: 135 tablet, Rfl: 0   albuterol (VENTOLIN HFA) 108 (90 Base) MCG/ACT inhaler, Inhale 2 puffs into the lungs every 6 (six) hours as needed for wheezing or shortness of breath., Disp: 1 each, Rfl: 0   Ascorbic Acid (VITAMIN C) 1000 MG tablet, Take 1,000 mg by mouth daily., Disp: , Rfl:    carvedilol (COREG) 25 MG tablet, Take 1 tablet (25 mg total) by mouth 2 (two) times daily. NO FURTHER REFILLS UNTIL SEEN IN CLINIC. MUST KEEP SCHEDULED APPOINTMENT., Disp: 180 tablet, Rfl: 0   Cholecalciferol 25 MCG (1000 UT) tablet, Take by mouth daily. , Disp: , Rfl:    clobetasol (TEMOVATE) 0.05 % external solution, Apply 1 application topically as needed. , Disp: , Rfl:    estradiol (CLIMARA - DOSED IN MG/24 HR) 0.025 mg/24hr patch, Place 1 patch (0.025 mg total) onto the skin once a week., Disp: 4 patch, Rfl: 0   fluticasone (FLONASE) 50 MCG/ACT nasal spray, Place 2 sprays into both nostrils daily., Disp: 16 g, Rfl: 3   ibuprofen (ADVIL) 600 MG tablet, Take 1 tablet (600 mg total) by mouth every 6 (six) hours as needed., Disp: 30 tablet, Rfl: 0   Ixekizumab 80 MG/ML SOSY, Inject 80 mg into the skin as directed.  Derm, Disp: , Rfl:    levothyroxine (SYNTHROID) 100 MCG tablet, Take 1 tablet (100 mcg total) by mouth daily before breakfast. (Patient not taking: Reported on 03/16/2022), Disp: 90 tablet, Rfl: 0   levothyroxine (SYNTHROID) 75 MCG tablet, Take 75 mcg by mouth daily., Disp: ,  Rfl:    lisinopril (ZESTRIL) 20 MG tablet, TAKE 1 TABLET(20 MG) BY MOUTH DAILY, Disp: 90 tablet, Rfl: 0   Multiple Vitamin (MULTI VITAMIN PO), Take 1 tablet by mouth daily., Disp: , Rfl:    naloxone (NARCAN) nasal spray 4 mg/0.1 mL, To reverse excess sedation from narcotics, Disp: 2 kit, Rfl: 1   omeprazole (PRILOSEC) 40 MG capsule, Take 1 capsule (40 mg total) by mouth daily., Disp: 30 capsule, Rfl: 0   ondansetron (ZOFRAN-ODT) 8 MG disintegrating tablet, 1/2- 1 tablet q  8 hr prn nausea, vomiting (Patient not taking: Reported on 03/16/2022), Disp: 20 tablet, Rfl: 0   [START ON 03/20/2023] oxyCODONE-acetaminophen (PERCOCET) 10-325 MG tablet, Take 1 tablet by mouth every 4 (four) hours as needed for pain., Disp: 135 tablet, Rfl: 0   Sod Picosulfate-Mag Ox-Cit Acd (CLENPIQ) 10-3.5-12 MG-GM -GM/160ML SOLN, Take 1 bottle at 5 PM followed by five 8 oz cups of water and repeat 5 hours before procedure. (Patient not taking: Reported on 03/16/2022), Disp: 320 mL, Rfl: 0   spironolactone (ALDACTONE) 25 MG tablet, TAKE 1 TABLET(25 MG) BY MOUTH DAILY, Disp: 30 tablet, Rfl: 0   Past Medical History:  Diagnosis Date   Arthritis    Psoriatic   Cardiomyopathy (HCC)    CHF (congestive heart failure) (HCC)    DDD (degenerative disc disease), lumbar 08/02/2021   Hyperlipidemia    Hypertension    Psoriasis    Sciatica of left side 08/02/2021   Thyroid disease    Transient weakness of left leg 08/13/2019   Wears contact lenses      Assessment and Plan: 1. Chronic bilateral low back pain with bilateral sciatica   2. DDD (degenerative disc disease), lumbar   3. Chronic pain syndrome   4. Chronic, continuous use of opioids   5. Chronic pain of both shoulders   6. Chronic left hip pain   7. Chronic sacroiliac joint pain   8. Left wrist pain   9. Psoriatic arthritis (HCC)   10. Psoriasis with arthropathy (HCC)   11. Pain in multiple finger joints   Based on our discussion it is appropriate to refill her medicines.  This will be dated for July 15 and August 14.  I have reviewed the [facility] practitioner database information is appropriate.  I want her to continue with her stretching strengthening exercises.  If the pain is not better in approximately 2 weeks have instructed her to contact us.  She may be a candidate for short course of oral steroids.  I want her to continue with physical therapy massage for the low back in the meantime.  Otherwise she seems to be responding  favorably to chronic opioid therapy and this is keeping her active functional and she is pleased with that.  Continue following up with her rheumatologist for her chronic psoriatic arthritis with return to clinic scheduled in 2 months.  Follow Up Instructions:    I discussed the assessment and treatment plan with the patient. The patient was provided an opportunity to ask questions and all were answered. The patient agreed with the plan and demonstrated an understanding of the instructions.   The patient was advised to call back or seek an in-person evaluation if the symptoms worsen or if the condition fails to improve as anticipated.  I provided 30 minutes of non-face-to-face time during this encounter.   Yevette Edwards, MD

## 2023-03-28 ENCOUNTER — Encounter: Payer: Self-pay | Admitting: Anesthesiology

## 2023-04-03 ENCOUNTER — Telehealth: Payer: Self-pay | Admitting: Anesthesiology

## 2023-04-03 NOTE — Telephone Encounter (Signed)
Patient states she needs updated letter of restrictions for work. Says Dr Pernell Dupre wrote the last one.

## 2023-05-15 ENCOUNTER — Encounter: Payer: Commercial Managed Care - PPO | Admitting: Anesthesiology

## 2023-05-16 ENCOUNTER — Encounter: Payer: Self-pay | Admitting: Anesthesiology

## 2023-05-16 ENCOUNTER — Ambulatory Visit: Payer: Commercial Managed Care - PPO | Attending: Anesthesiology | Admitting: Anesthesiology

## 2023-05-16 DIAGNOSIS — G894 Chronic pain syndrome: Secondary | ICD-10-CM

## 2023-05-16 DIAGNOSIS — M5442 Lumbago with sciatica, left side: Secondary | ICD-10-CM

## 2023-05-16 DIAGNOSIS — M5136 Other intervertebral disc degeneration, lumbar region: Secondary | ICD-10-CM

## 2023-05-16 DIAGNOSIS — Z79891 Long term (current) use of opiate analgesic: Secondary | ICD-10-CM

## 2023-05-16 DIAGNOSIS — M25552 Pain in left hip: Secondary | ICD-10-CM

## 2023-05-16 DIAGNOSIS — G8929 Other chronic pain: Secondary | ICD-10-CM

## 2023-05-16 DIAGNOSIS — F1190 Opioid use, unspecified, uncomplicated: Secondary | ICD-10-CM

## 2023-05-16 DIAGNOSIS — M5441 Lumbago with sciatica, right side: Secondary | ICD-10-CM

## 2023-05-16 DIAGNOSIS — M5432 Sciatica, left side: Secondary | ICD-10-CM

## 2023-05-16 MED ORDER — OXYCODONE-ACETAMINOPHEN 10-325 MG PO TABS: 1.0000 | ORAL_TABLET | ORAL | 0 refills | Status: DC | PRN

## 2023-05-16 NOTE — Progress Notes (Signed)
Virtual Visit via Telephone Note  I connected with Krista Kennedy on 05/16/23 at  1:20 PM EDT by telephone and verified that I am speaking with the correct person using two identifiers.  Location: Patient: Home Provider: Pain control center   I discussed the limitations, risks, security and privacy concerns of performing an evaluation and management service by telephone and the availability of in person appointments. I also discussed with the patient that there may be a patient responsible charge related to this service. The patient expressed understanding and agreed to proceed.   History of Present Illness: I spoke with Krista Kennedy via telephone as we were unable to link for the video portion of the conference.  She reports that she is having an consistently increased problem with her back pain with radiation into the left hip left calf and foot.  She is also experiencing occasional numbness and tingling but no weakness.  She has had sciatica symptoms in the past with the most recent epidural back in July of last year.  She had good success with 80 to 90% relief of her sciatica symptoms and a 50 to 75% reduction in her low back pain.  She desires to proceed with a repeat epidural as she has failed to gain any significant improvement with heat or ice application to the back mild stretching or anti-inflammatory medications and the Percocet are not working effectively for the back pain.  She has increased her utilization and will be to do a short secondary to the increased pain.  She also has systemic rheumatoid arthritic pain for which she takes the oral opioids.  That pain does respond to the opioids but the back pain has been unrelenting and is keeping her from staying active or sleeping at night and she called in sick today secondary to the severity of discomfort.  No problems with bowel or bladder function are noted.  Review of systems: General: No fevers or chills Pulmonary: No shortness of  breath or dyspnea Cardiac: No angina or palpitations or lightheadedness GI: No abdominal pain or constipation Psych: No depression    Observations/Objective:  Current Outpatient Medications:    [START ON 06/16/2023] oxyCODONE-acetaminophen (PERCOCET) 10-325 MG tablet, Take 1 tablet by mouth every 4 (four) hours as needed for pain., Disp: 135 tablet, Rfl: 0   albuterol (VENTOLIN HFA) 108 (90 Base) MCG/ACT inhaler, Inhale 2 puffs into the lungs every 6 (six) hours as needed for wheezing or shortness of breath., Disp: 1 each, Rfl: 0   Ascorbic Acid (VITAMIN C) 1000 MG tablet, Take 1,000 mg by mouth daily., Disp: , Rfl:    carvedilol (COREG) 25 MG tablet, Take 1 tablet (25 mg total) by mouth 2 (two) times daily. NO FURTHER REFILLS UNTIL SEEN IN CLINIC. MUST KEEP SCHEDULED APPOINTMENT., Disp: 180 tablet, Rfl: 0   Cholecalciferol 25 MCG (1000 UT) tablet, Take by mouth daily. , Disp: , Rfl:    clobetasol (TEMOVATE) 0.05 % external solution, Apply 1 application topically as needed. , Disp: , Rfl:    estradiol (CLIMARA - DOSED IN MG/24 HR) 0.025 mg/24hr patch, Place 1 patch (0.025 mg total) onto the skin once a week., Disp: 4 patch, Rfl: 0   fluticasone (FLONASE) 50 MCG/ACT nasal spray, Place 2 sprays into both nostrils daily., Disp: 16 g, Rfl: 3   ibuprofen (ADVIL) 600 MG tablet, Take 1 tablet (600 mg total) by mouth every 6 (six) hours as needed., Disp: 30 tablet, Rfl: 0   Ixekizumab 80 MG/ML SOSY, Inject  80 mg into the skin as directed. [facility], Disp: , Rfl:    levothyroxine (SYNTHROID) 100 MCG tablet, Take 1 tablet (100 mcg total) by mouth daily before breakfast. (Patient not taking: Reported on 03/16/2022), Disp: 90 tablet, Rfl: 0   levothyroxine (SYNTHROID) 75 MCG tablet, Take 75 mcg by mouth daily., Disp: , Rfl:    lisinopril (ZESTRIL) 20 MG tablet, TAKE 1 TABLET(20 MG) BY MOUTH DAILY, Disp: 90 tablet, Rfl: 0   Multiple Vitamin (MULTI VITAMIN PO), Take 1 tablet by mouth daily., Disp: , Rfl:     naloxone (NARCAN) nasal spray 4 mg/0.1 mL, To reverse excess sedation from narcotics, Disp: 2 kit, Rfl: 1   omeprazole (PRILOSEC) 40 MG capsule, Take 1 capsule (40 mg total) by mouth daily., Disp: 30 capsule, Rfl: 0   ondansetron (ZOFRAN-ODT) 8 MG disintegrating tablet, 1/2- 1 tablet q 8 hr prn nausea, vomiting (Patient not taking: Reported on 03/16/2022), Disp: 20 tablet, Rfl: 0   [START ON 05/17/2023] oxyCODONE-acetaminophen (PERCOCET) 10-325 MG tablet, Take 1 tablet by mouth every 4 (four) hours as needed for pain., Disp: 135 tablet, Rfl: 0   Sod Picosulfate-Mag Ox-Cit Acd (CLENPIQ) 10-3.5-12 MG-GM -GM/160ML SOLN, Take 1 bottle at 5 PM followed by five 8 oz cups of water and repeat 5 hours before procedure. (Patient not taking: Reported on 03/16/2022), Disp: 320 mL, Rfl: 0   spironolactone (ALDACTONE) 25 MG tablet, TAKE 1 TABLET(25 MG) BY MOUTH DAILY, Disp: 30 tablet, Rfl: 0  Past Medical History:  Diagnosis Date   Arthritis    Psoriatic   Cardiomyopathy (HCC)    CHF (congestive heart failure) (HCC)    DDD (degenerative disc disease), lumbar 08/02/2021   Hyperlipidemia    Hypertension    Psoriasis    Sciatica of left side 08/02/2021   Thyroid disease    Transient weakness of left leg 08/13/2019   Wears contact lenses      Assessment and Plan: 1. Chronic bilateral low back pain with bilateral sciatica   2. DDD (degenerative disc disease), lumbar   3. Chronic pain syndrome   4. Sciatica of left side   5. Chronic left hip pain   6. Chronic, continuous use of opioids    Based on her discussion it is appropriate to bring her in the clinic as soon as possible for a lumbar epidural steroid injection.  We have gone over the risks and benefits of the steroid injections.  She has failed conservative therapy despite efforts at stretching strengthening and anti-inflammatory medications and has responded favorably to epidurals in the past.  Will schedule this for soon as possible.  In the  meantime I am going to allow her an early refill on her Percocet dated for September 11 and October 11.  No other changes in the regimen will be initiated at this time.  Continue with mild stretching until we can do an epidural for her.  Continue follow-up with her primary care physicians for baseline medical care.  Follow Up Instructions:    I discussed the assessment and treatment plan with the patient. The patient was provided an opportunity to ask questions and all were answered. The patient agreed with the plan and demonstrated an understanding of the instructions.   The patient was advised to call back or seek an in-person evaluation if the symptoms worsen or if the condition fails to improve as anticipated.  I provided 30 minutes of non-face-to-face time during this encounter.   Yevette Edwards, MD

## 2023-05-17 ENCOUNTER — Telehealth: Payer: Self-pay | Admitting: Anesthesiology

## 2023-05-17 NOTE — Telephone Encounter (Signed)
Patient does not take oral steroids. I will send a message to Dr. Pernell Dupre asking him to prescribe. Also requesting a work note for today and yesterday. I told her I would have a work note at the front desk for her to pick up. Also asking about LESI. I informed her that it has been ordered, will require approval.

## 2023-05-17 NOTE — Telephone Encounter (Signed)
PT called states that she had spoke with Krista Kennedy on yesterday about steroid medication. PT wanted to see was Krista Kennedy going to send it in until she can get autho for procedure. PT also stated that she had been in pain , wanted to know if Krista Kennedy will give her a work note for yesterday and today. Please give patient a call. TY

## 2023-06-01 LAB — TOXASSURE SELECT 13 (MW), URINE

## 2023-06-06 ENCOUNTER — Ambulatory Visit: Payer: Commercial Managed Care - PPO | Admitting: Anesthesiology

## 2023-06-06 ENCOUNTER — Other Ambulatory Visit: Payer: Self-pay | Admitting: Anesthesiology

## 2023-06-06 ENCOUNTER — Encounter: Payer: Self-pay | Admitting: Anesthesiology

## 2023-06-06 ENCOUNTER — Ambulatory Visit
Admission: RE | Admit: 2023-06-06 | Discharge: 2023-06-06 | Disposition: A | Discharge status: Home / Self Care | Payer: Commercial Managed Care - PPO | Source: Ambulatory Visit | Attending: Anesthesiology | Admitting: Anesthesiology

## 2023-06-06 VITALS — BP 143/76 | HR 94 | Temp 96.5°F | Resp 17 | Ht 70.0 in | Wt 160.0 lb

## 2023-06-06 DIAGNOSIS — M5432 Sciatica, left side: Secondary | ICD-10-CM

## 2023-06-06 DIAGNOSIS — Z79891 Long term (current) use of opiate analgesic: Secondary | ICD-10-CM | POA: Diagnosis not present

## 2023-06-06 DIAGNOSIS — M25532 Pain in left wrist: Secondary | ICD-10-CM

## 2023-06-06 DIAGNOSIS — R52 Pain, unspecified: Secondary | ICD-10-CM

## 2023-06-06 DIAGNOSIS — M5442 Lumbago with sciatica, left side: Secondary | ICD-10-CM

## 2023-06-06 DIAGNOSIS — G894 Chronic pain syndrome: Secondary | ICD-10-CM | POA: Diagnosis not present

## 2023-06-06 DIAGNOSIS — M51369 Other intervertebral disc degeneration, lumbar region without mention of lumbar back pain or lower extremity pain: Secondary | ICD-10-CM | POA: Insufficient documentation

## 2023-06-06 DIAGNOSIS — F1190 Opioid use, unspecified, uncomplicated: Secondary | ICD-10-CM

## 2023-06-06 DIAGNOSIS — M5441 Lumbago with sciatica, right side: Secondary | ICD-10-CM | POA: Diagnosis not present

## 2023-06-06 DIAGNOSIS — L4050 Arthropathic psoriasis, unspecified: Secondary | ICD-10-CM

## 2023-06-06 DIAGNOSIS — M79672 Pain in left foot: Secondary | ICD-10-CM | POA: Diagnosis not present

## 2023-06-06 DIAGNOSIS — M25552 Pain in left hip: Secondary | ICD-10-CM | POA: Diagnosis not present

## 2023-06-06 DIAGNOSIS — G8929 Other chronic pain: Secondary | ICD-10-CM

## 2023-06-06 MED ORDER — TRIAMCINOLONE ACETONIDE 40 MG/ML IJ SUSP
40.0000 mg | Freq: Once | INTRAMUSCULAR | Status: AC
  Administered 2023-06-06: 40 mg

## 2023-06-06 MED ORDER — OXYCODONE-ACETAMINOPHEN 10-325 MG PO TABS: 1.0000 | ORAL_TABLET | ORAL | 0 refills | Status: AC | PRN

## 2023-06-06 MED ORDER — LIDOCAINE HCL (PF) 1 % IJ SOLN
INTRAMUSCULAR | Status: AC
  Filled 2023-06-06: qty 10

## 2023-06-06 MED ORDER — LIDOCAINE HCL (PF) 1 % IJ SOLN
5.0000 mL | Freq: Once | INTRAMUSCULAR | Status: AC
  Administered 2023-06-06: 5 mL via SUBCUTANEOUS

## 2023-06-06 MED ORDER — SODIUM CHLORIDE (PF) 0.9 % IJ SOLN
INTRAMUSCULAR | Status: AC
  Filled 2023-06-06: qty 10

## 2023-06-06 MED ORDER — SODIUM CHLORIDE 0.9% FLUSH
10.0000 mL | Freq: Once | INTRAVENOUS | Status: AC
  Administered 2023-06-06: 10 mL

## 2023-06-06 MED ORDER — IOHEXOL 180 MG/ML  SOLN
INTRAMUSCULAR | Status: AC
  Filled 2023-06-06: qty 20

## 2023-06-06 MED ORDER — TRIAMCINOLONE ACETONIDE 40 MG/ML IJ SUSP
INTRAMUSCULAR | Status: AC
  Filled 2023-06-06: qty 1

## 2023-06-06 MED ORDER — ROPIVACAINE HCL 2 MG/ML IJ SOLN
10.0000 mL | Freq: Once | INTRAMUSCULAR | Status: AC
  Administered 2023-06-06: 1 mL via EPIDURAL

## 2023-06-06 MED ORDER — IOHEXOL 180 MG/ML  SOLN
10.0000 mL | Freq: Once | INTRAMUSCULAR | Status: AC | PRN
  Administered 2023-06-06: 10 mL via EPIDURAL

## 2023-06-06 MED ORDER — ROPIVACAINE HCL 2 MG/ML IJ SOLN
INTRAMUSCULAR | Status: AC
  Filled 2023-06-06: qty 20

## 2023-06-06 NOTE — Progress Notes (Signed)
Subjective:  Patient ID: Krista Kennedy, female    DOB: 04-12-1966  Age: 57 y.o. MRN: [id number]  CC: Back Pain (lower)   Procedure: L5-S1 epidural steroid and fluoroscopic guidance without sedation  HPI Krista Kennedy presents for reevaluation.  Krista Kennedy continues to have pain in the left buttocks radiating and into the left hip left posterior lateral leg and the base of the left foot with spasming component noted.  It is worse with certain activity and prolonged standing.  She has not been able to get relief from it with conservative measures.  She has been doing her home physical therapy and massage application with no success and possible worsening.  She is taking her medications as prescribed generally taking her oxycodone every 4 hours yielding approximately 75% improvement for about 4 hours to 5 hours.  She averages 135/month.  She has been on opioids chronically for psoriatic arthritis and diffuse body pain.  The quality characteristic and distribution of this pain has been stable in nature with no recent changes.  No change in lower extremity strength function bowel or bladder function is noted.  Her primary complaint today revolves around the left lower extremity pain as described.  Outpatient Medications Prior to Visit  Medication Sig Dispense Refill   albuterol (VENTOLIN HFA) 108 (90 Base) MCG/ACT inhaler Inhale 2 puffs into the lungs every 6 (six) hours as needed for wheezing or shortness of breath. 1 each 0   Ascorbic Acid (VITAMIN C) 1000 MG tablet Take 1,000 mg by mouth daily.     carvedilol (COREG) 25 MG tablet Take 1 tablet (25 mg total) by mouth 2 (two) times daily. NO FURTHER REFILLS UNTIL SEEN IN CLINIC. MUST KEEP SCHEDULED APPOINTMENT. 180 tablet 0   Cholecalciferol 25 MCG (1000 UT) tablet Take by mouth daily.      clobetasol (TEMOVATE) 0.05 % external solution Apply 1 application topically as needed.      estradiol (CLIMARA - DOSED IN MG/24 HR) 0.025 mg/24hr patch Place 1 patch  (0.025 mg total) onto the skin once a week. 4 patch 0   fluticasone (FLONASE) 50 MCG/ACT nasal spray Place 2 sprays into both nostrils daily. 16 g 3   ibuprofen (ADVIL) 600 MG tablet Take 1 tablet (600 mg total) by mouth every 6 (six) hours as needed. 30 tablet 0   Ixekizumab 80 MG/ML SOSY Inject 80 mg into the skin as directed. [facility]     levothyroxine (SYNTHROID) 100 MCG tablet Take 1 tablet (100 mcg total) by mouth daily before breakfast. 90 tablet 0   levothyroxine (SYNTHROID) 75 MCG tablet Take 75 mcg by mouth daily.     lisinopril (ZESTRIL) 20 MG tablet TAKE 1 TABLET(20 MG) BY MOUTH DAILY 90 tablet 0   Multiple Vitamin (MULTI VITAMIN PO) Take 1 tablet by mouth daily.     naloxone (NARCAN) nasal spray 4 mg/0.1 mL To reverse excess sedation from narcotics 2 kit 1   omeprazole (PRILOSEC) 40 MG capsule Take 1 capsule (40 mg total) by mouth daily. 30 capsule 0   ondansetron (ZOFRAN-ODT) 8 MG disintegrating tablet 1/2- 1 tablet q 8 hr prn nausea, vomiting 20 tablet 0   [START ON 06/16/2023] oxyCODONE-acetaminophen (PERCOCET) 10-325 MG tablet Take 1 tablet by mouth every 4 (four) hours as needed for pain. 135 tablet 0   Sod Picosulfate-Mag Ox-Cit Acd (CLENPIQ) 10-3.5-12 MG-GM -GM/160ML SOLN Take 1 bottle at 5 PM followed by five 8 oz cups of water and repeat 5 hours before procedure. 320  mL 0   spironolactone (ALDACTONE) 25 MG tablet TAKE 1 TABLET(25 MG) BY MOUTH DAILY 30 tablet 0   oxyCODONE-acetaminophen (PERCOCET) 10-325 MG tablet Take 1 tablet by mouth every 4 (four) hours as needed for pain. 135 tablet 0   RYBELSUS 3 MG TABS Take 1 tablet by mouth daily.     No facility-administered medications prior to visit.    Review of Systems CNS: No confusion or sedation Cardiac: No angina or palpitations GI: No abdominal pain or constipation Constitutional: No nausea vomiting fevers or chills  Objective:  BP 122/72   Pulse 94   Temp (!) 96.5 F (35.8 C) (Temporal)   Resp 16   Ht 5'  10" (1.778 m)   Wt 160 lb (72.6 kg)   SpO2 100%   BMI 22.96 kg/m    BP Readings from Last 3 Encounters:  06/06/23 122/72  08/14/22 136/72  03/16/22 (!) 164/85     Wt Readings from Last 3 Encounters:  06/06/23 160 lb (72.6 kg)  08/14/22 170 lb (77.1 kg)  03/16/22 170 lb (77.1 kg)     Physical Exam Pt is alert and oriented PERRL EOMI HEART IS RRR no murmur or rub LCTA no wheezing or rales MUSCULOSKELETAL reveals a positive straight leg raise causing low back pain and left hip pain with tightening in the calf.  She is negative on the right.  Muscle tone and bulk is good.  She walks with a mildly antalgic gait.  She does have some pain while in the standing position with extension and left lateral rotation causing pain in the left lower back.  Not as much on the right side.  No trigger points are noted.  Labs  Lab Results  Component Value Date   HGBA1C 6.4 (H) 05/06/2020   HGBA1C 6.7 (H) 07/20/2018   HGBA1C 5.9 (H) 04/11/2017   Lab Results  Component Value Date   LDLCALC 118 (H) 05/06/2020   CREATININE 1.29 (H) 09/04/2020    -------------------------------------------------------------------------------------------------------------------- Lab Results  Component Value Date   WBC 7.2 09/04/2020   HGB 13.4 09/04/2020   HCT 40.6 09/04/2020   PLT 270 09/04/2020   GLUCOSE 117 (H) 09/04/2020   CHOL 200 (H) 05/06/2020   TRIG 122 05/06/2020   HDL 60 05/06/2020   LDLCALC 118 (H) 05/06/2020   ALT 16 05/06/2020   AST 24 05/06/2020   NA 136 09/04/2020   K 3.9 09/04/2020   CL 97 (L) 09/04/2020   CREATININE 1.29 (H) 09/04/2020   BUN 24 (H) 09/04/2020   CO2 28 09/04/2020   TSH 0.130 (L) 05/06/2020   HGBA1C 6.4 (H) 05/06/2020    --------------------------------------------------------------------------------------------------------------------- No results found.   Assessment & Plan:   Amour was seen today for back pain.  Diagnoses and all orders for this  visit:  Left wrist pain  Chronic bilateral low back pain with bilateral sciatica -     Lumbar Epidural Injection -     triamcinolone acetonide (KENALOG-40) injection 40 mg -     sodium chloride flush (NS) 0.9 % injection 10 mL -     ropivacaine (PF) 2 mg/mL (0.2%) (NAROPIN) injection 10 mL -     lidocaine (PF) (XYLOCAINE) 1 % injection 5 mL -     iohexol (OMNIPAQUE) 180 MG/ML injection 10 mL  DDD (degenerative disc disease), lumbar -     Lumbar Epidural Injection  Sciatica of left side -     Lumbar Epidural Injection -     triamcinolone acetonide (  KENALOG-40) injection 40 mg -     sodium chloride flush (NS) 0.9 % injection 10 mL -     ropivacaine (PF) 2 mg/mL (0.2%) (NAROPIN) injection 10 mL -     lidocaine (PF) (XYLOCAINE) 1 % injection 5 mL -     iohexol (OMNIPAQUE) 180 MG/ML injection 10 mL  Chronic left hip pain -     Lumbar Epidural Injection  Chronic, continuous use of opioids  Chronic pain syndrome  Psoriatic arthritis (HCC)  Other orders -     oxyCODONE-acetaminophen (PERCOCET) 10-325 MG tablet; Take 1 tablet by mouth every 4 (four) hours as needed for pain.        ----------------------------------------------------------------------------------------------------------------------  Problem List Items Addressed This Visit       Unprioritized   Chronic hip pain (Chronic)   Relevant Medications   triamcinolone acetonide (KENALOG-40) injection 40 mg (Start on 06/06/2023  1:45 PM)   ropivacaine (PF) 2 mg/mL (0.2%) (NAROPIN) injection 10 mL (Start on 06/06/2023  1:45 PM)   lidocaine (PF) (XYLOCAINE) 1 % injection 5 mL (Start on 06/06/2023  1:45 PM)   oxyCODONE-acetaminophen (PERCOCET) 10-325 MG tablet (Start on 07/16/2023)   Chronic low back pain (Chronic)   Relevant Medications   triamcinolone acetonide (KENALOG-40) injection 40 mg (Start on 06/06/2023  1:45 PM)   sodium chloride flush (NS) 0.9 % injection 10 mL (Start on 06/06/2023  1:45 PM)   ropivacaine (PF) 2  mg/mL (0.2%) (NAROPIN) injection 10 mL (Start on 06/06/2023  1:45 PM)   lidocaine (PF) (XYLOCAINE) 1 % injection 5 mL (Start on 06/06/2023  1:45 PM)   iohexol (OMNIPAQUE) 180 MG/ML injection 10 mL   oxyCODONE-acetaminophen (PERCOCET) 10-325 MG tablet (Start on 07/16/2023)   Chronic pain syndrome (Chronic)   Relevant Medications   triamcinolone acetonide (KENALOG-40) injection 40 mg (Start on 06/06/2023  1:45 PM)   ropivacaine (PF) 2 mg/mL (0.2%) (NAROPIN) injection 10 mL (Start on 06/06/2023  1:45 PM)   lidocaine (PF) (XYLOCAINE) 1 % injection 5 mL (Start on 06/06/2023  1:45 PM)   oxyCODONE-acetaminophen (PERCOCET) 10-325 MG tablet (Start on 07/16/2023)   Left wrist pain - Primary (Chronic)   Chronic, continuous use of opioids   DDD (degenerative disc disease), lumbar   Relevant Medications   triamcinolone acetonide (KENALOG-40) injection 40 mg (Start on 06/06/2023  1:45 PM)   oxyCODONE-acetaminophen (PERCOCET) 10-325 MG tablet (Start on 07/16/2023)   Sciatica of left side   Relevant Medications   triamcinolone acetonide (KENALOG-40) injection 40 mg (Start on 06/06/2023  1:45 PM)   sodium chloride flush (NS) 0.9 % injection 10 mL (Start on 06/06/2023  1:45 PM)   ropivacaine (PF) 2 mg/mL (0.2%) (NAROPIN) injection 10 mL (Start on 06/06/2023  1:45 PM)   lidocaine (PF) (XYLOCAINE) 1 % injection 5 mL (Start on 06/06/2023  1:45 PM)   iohexol (OMNIPAQUE) 180 MG/ML injection 10 mL   Other Visit Diagnoses     Psoriatic arthritis (HCC)       Relevant Medications   triamcinolone acetonide (KENALOG-40) injection 40 mg (Start on 06/06/2023  1:45 PM)   oxyCODONE-acetaminophen (PERCOCET) 10-325 MG tablet (Start on 07/16/2023)         ----------------------------------------------------------------------------------------------------------------------  1. Chronic bilateral low back pain with bilateral sciatica Proceed with a repeat epidural steroid injection today.  She has had previous epidurals in the  distant past most notably and July and October 2023.  We gone over the risk and benefits of the procedure in full detail all questions were  answered.  She responded favorably to these.  We talked about increasing her stretching and physical therapy modalities specially to focus on the psoas and quadratus musculature. - Lumbar Epidural Injection - triamcinolone acetonide (KENALOG-40) injection 40 mg - sodium chloride flush (NS) 0.9 % injection 10 mL - ropivacaine (PF) 2 mg/mL (0.2%) (NAROPIN) injection 10 mL - lidocaine (PF) (XYLOCAINE) 1 % injection 5 mL - iohexol (OMNIPAQUE) 180 MG/ML injection 10 mL  2. DDD (degenerative disc disease), lumbar As above - Lumbar Epidural Injection  3. Sciatica of left side As above - Lumbar Epidural Injection - triamcinolone acetonide (KENALOG-40) injection 40 mg - sodium chloride flush (NS) 0.9 % injection 10 mL - ropivacaine (PF) 2 mg/mL (0.2%) (NAROPIN) injection 10 mL - lidocaine (PF) (XYLOCAINE) 1 % injection 5 mL - iohexol (OMNIPAQUE) 180 MG/ML injection 10 mL  4. Chronic left hip pain  - Lumbar Epidural Injection  5. Left wrist pain   6. Chronic, continuous use of opioids I have reviewed the [facility] practitioner database information is appropriate.  She is responding favorably to chronic opioid therapy.  Refill is authorized for November 10.  He has a return eval in approxi-4 to 6 weeks.  7. Chronic pain syndrome As above  8. Psoriatic arthritis (HCC) Continue follow-up with primary care physician for baseline medical care.    ----------------------------------------------------------------------------------------------------------------------  I am having Lillias Parfitt maintain her vitamin C, Cholecalciferol, Ixekizumab, clobetasol, naloxone, fluticasone, Multiple Vitamin (MULTI VITAMIN PO), estradiol, Clenpiq, omeprazole, albuterol, spironolactone, lisinopril, levothyroxine, carvedilol, ondansetron, ibuprofen, levothyroxine,  oxyCODONE-acetaminophen, Rybelsus, and oxyCODONE-acetaminophen.   Meds ordered this encounter  Medications   triamcinolone acetonide (KENALOG-40) injection 40 mg   sodium chloride flush (NS) 0.9 % injection 10 mL   ropivacaine (PF) 2 mg/mL (0.2%) (NAROPIN) injection 10 mL   lidocaine (PF) (XYLOCAINE) 1 % injection 5 mL   iohexol (OMNIPAQUE) 180 MG/ML injection 10 mL   oxyCODONE-acetaminophen (PERCOCET) 10-325 MG tablet    Sig: Take 1 tablet by mouth every 4 (four) hours as needed for pain.    Dispense:  135 tablet    Refill:  0    Ok for early refill this occasion. Dr. Pernell Dupre   Patient's Medications  New Prescriptions   No medications on file  Previous Medications   ALBUTEROL (VENTOLIN HFA) 108 (90 BASE) MCG/ACT INHALER    Inhale 2 puffs into the lungs every 6 (six) hours as needed for wheezing or shortness of breath.   ASCORBIC ACID (VITAMIN C) 1000 MG TABLET    Take 1,000 mg by mouth daily.   CARVEDILOL (COREG) 25 MG TABLET    Take 1 tablet (25 mg total) by mouth 2 (two) times daily. NO FURTHER REFILLS UNTIL SEEN IN CLINIC. MUST KEEP SCHEDULED APPOINTMENT.   CHOLECALCIFEROL 25 MCG (1000 UT) TABLET    Take by mouth daily.    CLOBETASOL (TEMOVATE) 0.05 % EXTERNAL SOLUTION    Apply 1 application topically as needed.    ESTRADIOL (CLIMARA - DOSED IN MG/24 HR) 0.025 MG/24HR PATCH    Place 1 patch (0.025 mg total) onto the skin once a week.   FLUTICASONE (FLONASE) 50 MCG/ACT NASAL SPRAY    Place 2 sprays into both nostrils daily.   IBUPROFEN (ADVIL) 600 MG TABLET    Take 1 tablet (600 mg total) by mouth every 6 (six) hours as needed.   IXEKIZUMAB 80 MG/ML SOSY    Inject 80 mg into the skin as directed. [facility]   LEVOTHYROXINE (SYNTHROID) 100 MCG TABLET  Take 1 tablet (100 mcg total) by mouth daily before breakfast.   LEVOTHYROXINE (SYNTHROID) 75 MCG TABLET    Take 75 mcg by mouth daily.   LISINOPRIL (ZESTRIL) 20 MG TABLET    TAKE 1 TABLET(20 MG) BY MOUTH DAILY   MULTIPLE VITAMIN  (MULTI VITAMIN PO)    Take 1 tablet by mouth daily.   NALOXONE (NARCAN) NASAL SPRAY 4 MG/0.1 ML    To reverse excess sedation from narcotics   OMEPRAZOLE (PRILOSEC) 40 MG CAPSULE    Take 1 capsule (40 mg total) by mouth daily.   ONDANSETRON (ZOFRAN-ODT) 8 MG DISINTEGRATING TABLET    1/2- 1 tablet q 8 hr prn nausea, vomiting   OXYCODONE-ACETAMINOPHEN (PERCOCET) 10-325 MG TABLET    Take 1 tablet by mouth every 4 (four) hours as needed for pain.   RYBELSUS 3 MG TABS    Take 1 tablet by mouth daily.   SOD PICOSULFATE-MAG OX-CIT ACD (CLENPIQ) 10-3.5-12 MG-GM -GM/160ML SOLN    Take 1 bottle at 5 PM followed by five 8 oz cups of water and repeat 5 hours before procedure.   SPIRONOLACTONE (ALDACTONE) 25 MG TABLET    TAKE 1 TABLET(25 MG) BY MOUTH DAILY  Modified Medications   Modified Medication Previous Medication   OXYCODONE-ACETAMINOPHEN (PERCOCET) 10-325 MG TABLET oxyCODONE-acetaminophen (PERCOCET) 10-325 MG tablet      Take 1 tablet by mouth every 4 (four) hours as needed for pain.    Take 1 tablet by mouth every 4 (four) hours as needed for pain.  Discontinued Medications   No medications on file   ----------------------------------------------------------------------------------------------------------------------  Follow-up: Return in about 1 month (around 07/07/2023) for evaluation, med refill.   Procedure: L5-S1 LESI with fluoroscopic guidance and without moderate sedation  NOTE: The risks, benefits, and expectations of the procedure have been discussed and explained to the patient who was understanding and in agreement with suggested treatment plan. No guarantees were made.  DESCRIPTION OF PROCEDURE: Lumbar epidural steroid injection with no IV Versed, EKG, blood pressure, pulse, and pulse oximetry monitoring. The procedure was performed with the patient in the prone position under fluoroscopic guidance.  Sterile prep x3 was initiated and I then injected subcutaneous lidocaine to the  overlying L5-S1 site after its fluoroscopic identifictation.  Using strict aseptic technique, I then advanced an 18-gauge Tuohy epidural needle in the midline using interlaminar approach via loss-of-resistance to saline technique. There was negative aspiration for heme or  CSF.  I then confirmed position with both AP and Lateral fluoroscan.  2 cc of contrast dye were injected and a  total of 5 mL of Preservative-Free normal saline mixed with 40 mg of Kenalog and 1cc Ropicaine 0.2 percent were injected incrementally via the  epidurally placed needle. The needle was removed. The patient tolerated the injection well and was convalesced and discharged to home in stable condition. Should the patient have any post procedure difficulty they have been instructed on how to contact us for assistance.   Yevette Edwards, MD

## 2023-06-06 NOTE — Patient Instructions (Signed)

## 2023-06-07 ENCOUNTER — Telehealth: Payer: Self-pay | Admitting: *Deleted

## 2023-06-07 NOTE — Telephone Encounter (Signed)
No problems post procedure. 

## 2023-07-03 ENCOUNTER — Ambulatory Visit: Payer: Commercial Managed Care - PPO | Admitting: Anesthesiology

## 2023-07-04 ENCOUNTER — Ambulatory Visit: Payer: Commercial Managed Care - PPO | Attending: Anesthesiology | Admitting: Anesthesiology

## 2023-07-04 ENCOUNTER — Encounter: Payer: Self-pay | Admitting: Anesthesiology

## 2023-07-04 DIAGNOSIS — M5441 Lumbago with sciatica, right side: Secondary | ICD-10-CM

## 2023-07-04 DIAGNOSIS — M5432 Sciatica, left side: Secondary | ICD-10-CM

## 2023-07-04 DIAGNOSIS — Z79891 Long term (current) use of opiate analgesic: Secondary | ICD-10-CM

## 2023-07-04 DIAGNOSIS — M25532 Pain in left wrist: Secondary | ICD-10-CM

## 2023-07-04 DIAGNOSIS — F1190 Opioid use, unspecified, uncomplicated: Secondary | ICD-10-CM

## 2023-07-04 DIAGNOSIS — L4050 Arthropathic psoriasis, unspecified: Secondary | ICD-10-CM

## 2023-07-04 DIAGNOSIS — M25552 Pain in left hip: Secondary | ICD-10-CM

## 2023-07-04 DIAGNOSIS — M5442 Lumbago with sciatica, left side: Secondary | ICD-10-CM

## 2023-07-04 DIAGNOSIS — G894 Chronic pain syndrome: Secondary | ICD-10-CM

## 2023-07-04 DIAGNOSIS — G8929 Other chronic pain: Secondary | ICD-10-CM

## 2023-07-04 MED ORDER — CYCLOBENZAPRINE HCL 10 MG PO TABS: 10.0000 mg | ORAL_TABLET | Freq: Two times a day (BID) | ORAL | 2 refills | Status: AC

## 2023-07-04 MED ORDER — OXYCODONE-ACETAMINOPHEN 10-325 MG PO TABS: 1.0000 | ORAL_TABLET | ORAL | 0 refills | Status: DC | PRN

## 2023-07-04 NOTE — Progress Notes (Signed)
Virtual Visit via Telephone Note  I connected with Saliah Diep on 07/04/23 at  2:00 PM EDT by telephone and verified that I am speaking with the correct person using two identifiers.  Location: Patient: Home Provider: Pain control center   I discussed the limitations, risks, security and privacy concerns of performing an evaluation and management service by telephone and the availability of in person appointments. I also discussed with the patient that there may be a patient responsible charge related to this service. The patient expressed understanding and agreed to proceed.   History of Present Illness: I spoke with Neyra via telephone as we were unable like for the video portion of the conference.  She reports that she did well following her recent L5-S1 epidural for about 2 to 3 weeks.  She had significant improvement in the left hip and left lower leg sciatica in addition to the low back pain lasting about 2 weeks but she has had recurrence over the past week or so that has intensified to the point of pain comparable to what she had at baseline.  Despite taking her chronic opioid therapy the pain persists and is associated with spasming and pain that keeps her awake at night and limits her activity.  She continues to have spasming running from the low back into the left hip buttocks and down in the posterior lateral left leg comparable to what she was experiencing initially.  We have reviewed her MRI once again showing evidence of multilevel degenerative disc disease with some foraminal encroachment at L3-4.  When she takes her opioid medications it does help with the systemic pain associated with her arthritis and does give her some relief with the low back but the spasming persists.  Otherwise she is in her usual state of health with no side effects with the medication.  No change in bowel or bladder function is notedReview of systems: General: No fevers or chills Pulmonary: No shortness of  breath or dyspnea Cardiac: No angina or palpitations or lightheadedness GI: No abdominal pain or constipation Psych: No depression .   Observations/Objective:  Current Outpatient Medications:    cyclobenzaprine (FLEXERIL) 10 MG tablet, Take 1 tablet (10 mg total) by mouth 2 (two) times daily., Disp: 30 tablet, Rfl: 2   albuterol (VENTOLIN HFA) 108 (90 Base) MCG/ACT inhaler, Inhale 2 puffs into the lungs every 6 (six) hours as needed for wheezing or shortness of breath., Disp: 1 each, Rfl: 0   Ascorbic Acid (VITAMIN C) 1000 MG tablet, Take 1,000 mg by mouth daily., Disp: , Rfl:    carvedilol (COREG) 25 MG tablet, Take 1 tablet (25 mg total) by mouth 2 (two) times daily. NO FURTHER REFILLS UNTIL SEEN IN CLINIC. MUST KEEP SCHEDULED APPOINTMENT., Disp: 180 tablet, Rfl: 0   Cholecalciferol 25 MCG (1000 UT) tablet, Take by mouth daily. , Disp: , Rfl:    clobetasol (TEMOVATE) 0.05 % external solution, Apply 1 application topically as needed. , Disp: , Rfl:    estradiol (CLIMARA - DOSED IN MG/24 HR) 0.025 mg/24hr patch, Place 1 patch (0.025 mg total) onto the skin once a week., Disp: 4 patch, Rfl: 0   fluticasone (FLONASE) 50 MCG/ACT nasal spray, Place 2 sprays into both nostrils daily., Disp: 16 g, Rfl: 3   ibuprofen (ADVIL) 600 MG tablet, Take 1 tablet (600 mg total) by mouth every 6 (six) hours as needed., Disp: 30 tablet, Rfl: 0   Ixekizumab 80 MG/ML SOSY, Inject 80 mg into the skin as  directed. [facility], Disp: , Rfl:    levothyroxine (SYNTHROID) 100 MCG tablet, Take 1 tablet (100 mcg total) by mouth daily before breakfast., Disp: 90 tablet, Rfl: 0   levothyroxine (SYNTHROID) 75 MCG tablet, Take 75 mcg by mouth daily., Disp: , Rfl:    lisinopril (ZESTRIL) 20 MG tablet, TAKE 1 TABLET(20 MG) BY MOUTH DAILY, Disp: 90 tablet, Rfl: 0   Multiple Vitamin (MULTI VITAMIN PO), Take 1 tablet by mouth daily., Disp: , Rfl:    naloxone (NARCAN) nasal spray 4 mg/0.1 mL, To reverse excess sedation from  narcotics, Disp: 2 kit, Rfl: 1   omeprazole (PRILOSEC) 40 MG capsule, Take 1 capsule (40 mg total) by mouth daily., Disp: 30 capsule, Rfl: 0   ondansetron (ZOFRAN-ODT) 8 MG disintegrating tablet, 1/2- 1 tablet q 8 hr prn nausea, vomiting, Disp: 20 tablet, Rfl: 0   [START ON 07/16/2023] oxyCODONE-acetaminophen (PERCOCET) 10-325 MG tablet, Take 1 tablet by mouth every 4 (four) hours as needed for pain., Disp: 135 tablet, Rfl: 0   [START ON 08/15/2023] oxyCODONE-acetaminophen (PERCOCET) 10-325 MG tablet, Take 1 tablet by mouth every 4 (four) hours as needed for pain., Disp: 135 tablet, Rfl: 0   RYBELSUS 3 MG TABS, Take 1 tablet by mouth daily., Disp: , Rfl:    Sod Picosulfate-Mag Ox-Cit Acd (CLENPIQ) 10-3.5-12 MG-GM -GM/160ML SOLN, Take 1 bottle at 5 PM followed by five 8 oz cups of water and repeat 5 hours before procedure., Disp: 320 mL, Rfl: 0   spironolactone (ALDACTONE) 25 MG tablet, TAKE 1 TABLET(25 MG) BY MOUTH DAILY, Disp: 30 tablet, Rfl: 0   Past Medical History:  Diagnosis Date   Arthritis    Psoriatic   Cardiomyopathy (HCC)    CHF (congestive heart failure) (HCC)    DDD (degenerative disc disease), lumbar 08/02/2021   Hyperlipidemia    Hypertension    Psoriasis    Sciatica of left side 08/02/2021   Thyroid disease    Transient weakness of left leg 08/13/2019   Wears contact lenses    Assessment and Plan:  1. Chronic bilateral low back pain with bilateral sciatica   2. Sciatica of left side   3. Chronic left hip pain   4. Left wrist pain   5. Chronic, continuous use of opioids   6. Chronic pain syndrome   7. Psoriatic arthritis (HCC)    Based on our conversation it is appropriate to continue current opioid therapy.  I have reviewed the [facility] practitioner database information is appropriate.  Fortunately she does continue to get relief with this.  Refill will be sent in for December 10 additionally.  Furthermore I am going to schedule her for an L3-L4 epidural steroid  injection at the next available date to see if we can get her some additional relief secondary to the persistent sciatica and spasming.  I am also going to start her on Flexeril 10 mg tablets at bedtime and a 5 mg tablet in the morning and afternoon to help with some of the low back spasming.  Have encouraged her to continue with physical therapy exercises and additionally continue follow-up with her primary care physicians for baseline medical care. Follow Up Instructions:    I discussed the assessment and treatment plan with the patient. The patient was provided an opportunity to ask questions and all were answered. The patient agreed with the plan and demonstrated an understanding of the instructions.   The patient was advised to call back or seek an in-person evaluation  if the symptoms worsen or if the condition fails to improve as anticipated.  I provided 30 minutes of non-face-to-face time during this encounter.   Yevette Edwards, MD

## 2023-07-24 ENCOUNTER — Ambulatory Visit: Payer: Commercial Managed Care - PPO | Admitting: Anesthesiology

## 2023-07-27 ENCOUNTER — Telehealth: Payer: Commercial Managed Care - PPO | Admitting: Physician Assistant

## 2023-07-27 DIAGNOSIS — K529 Noninfective gastroenteritis and colitis, unspecified: Secondary | ICD-10-CM

## 2023-07-27 MED ORDER — ONDANSETRON 8 MG PO TBDP
8.0000 mg | ORAL_TABLET | Freq: Three times a day (TID) | ORAL | 0 refills | Status: AC | PRN
Start: 2023-07-27 — End: ?

## 2023-07-27 NOTE — Progress Notes (Signed)
Virtual Visit Consent   [name], you are scheduled for a virtual visit with a [facility] provider today. Just as with appointments in the office, your consent must be obtained to participate. Your consent will be active for this visit and any virtual visit you may have with one of our providers in the next 365 days. If you have a MyChart account, a copy of this consent can be sent to you electronically.  As this is a virtual visit, video technology does not allow for your provider to perform a traditional examination. This may limit your provider's ability to fully assess your condition. If your provider identifies any concerns that need to be evaluated in person or the need to arrange testing (such as labs, EKG, etc.), we will make arrangements to do so. Although advances in technology are sophisticated, we cannot ensure that it will always work on either your end or our end. If the connection with a video visit is poor, the visit may have to be switched to a telephone visit. With either a video or telephone visit, we are not always able to ensure that we have a secure connection.  By engaging in this virtual visit, you consent to the provision of healthcare and authorize for your insurance to be billed (if applicable) for the services provided during this visit. Depending on your insurance coverage, you may receive a charge related to this service.  I need to obtain your verbal consent now. Are you willing to proceed with your visit today? Krista Kennedy has provided verbal consent on 07/27/2023 for a virtual visit (video or telephone). [name], [place]  Date: 07/27/2023 8:57 AM  Virtual Visit via Video Note   I, [name], connected with  Krista Kennedy  ([id number], 57) on 07/27/23 at  9:00 AM EST by a video-enabled telemedicine application and verified that I am speaking with the correct person using two identifiers.  Location: Patient: Virtual Visit Location Patient:  Home Provider: Virtual Visit Location Provider: Home Office   I discussed the limitations of evaluation and management by telemedicine and the availability of in person appointments. The patient expressed understanding and agreed to proceed.    History of Present Illness: Krista Kennedy is a 57 y.o. who identifies as a female who was assigned female at birth, and is being seen today for nausea, vomiting, diarrhea x 1 day.  HPI: 57 y/o F presents via telehealth c/o nausea, vomiting, diarrhea x 1 day. She denies changes in diet, prescription medications, or otc supplements. No known exposure to other family members or friends with similar symptoms. Denies bloody or mucous diarrhea. Denies urinary symptoms. Denies fever, chills, or back pain.     Problems:  Patient Active Problem List   Diagnosis Date Noted   Sciatica of left side 08/02/2021   DDD (degenerative disc disease), lumbar 08/02/2021   Encounter for screening colonoscopy    Gastric erythema    Nausea and vomiting    Transient weakness of left leg 08/13/2019   Recurrent major depressive disorder, in partial remission (HCC) 06/26/2019   Chronic, continuous use of opioids 02/19/2019   History of cardiomyopathy 08/21/2018   Shortness of breath 08/21/2018   Palpitations 08/21/2018   Chronic shoulder pain 06/14/2017   Encounter for long-term (current) use of other medications 05/24/2017   Left wrist pain 04/11/2017   Chronic sacroiliac joint pain 04/11/2017   Hyperlipidemia 03/15/2017   Elevated hemoglobin A1c 03/15/2017   Therapeutic opioid induced constipation 02/02/2017   Chronic  pain syndrome 04/07/2016   Chronic hip pain 04/07/2016   Pain in multiple finger joints 04/07/2016   Allergic rhinitis due to pollen 09/08/2015   Other long term (current) drug therapy 08/13/2015   Depression 08/06/2015   Opioid dependence, daily use (HCC) 08/06/2015   High risk medications (not anticoagulants) long-term use 02/11/2015    Dysmenorrhea 10/29/2013   Congestive heart failure (HCC) 10/29/2013   Menorrhagia 10/15/2013   Fibroid 10/10/2013   Hyperglycemia 08/22/2013   Hypertension 04/30/2013   Psoriasis 04/30/2013   Metrorrhagia 12/07/2011   Chronic low back pain 11/02/2011   GERD (gastroesophageal reflux disease) 07/12/2010   Hypothyroidism 07/12/2010   Psoriasis with arthropathy (HCC) 07/12/2010   Avitaminosis D 07/12/2010   Abnormal mammogram 07/12/2010   Breast screening 07/12/2010   Encounter for routine gynecological examination 07/12/2010   Insomnia 07/12/2010   Vitamin D deficiency 07/12/2010    Allergies:  Allergies  Allergen Reactions   Other     Hair dye    Sulfa Antibiotics Hives    Rash    Medications:  Current Outpatient Medications:    ondansetron (ZOFRAN-ODT) 8 MG disintegrating tablet, Take 1 tablet (8 mg total) by mouth every 8 (eight) hours as needed for nausea or vomiting., Disp: 20 tablet, Rfl: 0   albuterol (VENTOLIN HFA) 108 (90 Base) MCG/ACT inhaler, Inhale 2 puffs into the lungs every 6 (six) hours as needed for wheezing or shortness of breath., Disp: 1 each, Rfl: 0   Ascorbic Acid (VITAMIN C) 1000 MG tablet, Take 1,000 mg by mouth daily., Disp: , Rfl:    carvedilol (COREG) 25 MG tablet, Take 1 tablet (25 mg total) by mouth 2 (two) times daily. NO FURTHER REFILLS UNTIL SEEN IN CLINIC. MUST KEEP SCHEDULED APPOINTMENT., Disp: 180 tablet, Rfl: 0   Cholecalciferol 25 MCG (1000 UT) tablet, Take by mouth daily. , Disp: , Rfl:    clobetasol (TEMOVATE) 0.05 % external solution, Apply 1 application topically as needed. , Disp: , Rfl:    cyclobenzaprine (FLEXERIL) 10 MG tablet, Take 1 tablet (10 mg total) by mouth 2 (two) times daily., Disp: 30 tablet, Rfl: 2   estradiol (CLIMARA - DOSED IN MG/24 HR) 0.025 mg/24hr patch, Place 1 patch (0.025 mg total) onto the skin once a week., Disp: 4 patch, Rfl: 0   fluticasone (FLONASE) 50 MCG/ACT nasal spray, Place 2 sprays into both nostrils  daily., Disp: 16 g, Rfl: 3   ibuprofen (ADVIL) 600 MG tablet, Take 1 tablet (600 mg total) by mouth every 6 (six) hours as needed., Disp: 30 tablet, Rfl: 0   Ixekizumab 80 MG/ML SOSY, Inject 80 mg into the skin as directed. [facility], Disp: , Rfl:    levothyroxine (SYNTHROID) 100 MCG tablet, Take 1 tablet (100 mcg total) by mouth daily before breakfast., Disp: 90 tablet, Rfl: 0   levothyroxine (SYNTHROID) 75 MCG tablet, Take 75 mcg by mouth daily., Disp: , Rfl:    lisinopril (ZESTRIL) 20 MG tablet, TAKE 1 TABLET(20 MG) BY MOUTH DAILY, Disp: 90 tablet, Rfl: 0   Multiple Vitamin (MULTI VITAMIN PO), Take 1 tablet by mouth daily., Disp: , Rfl:    naloxone (NARCAN) nasal spray 4 mg/0.1 mL, To reverse excess sedation from narcotics, Disp: 2 kit, Rfl: 1   omeprazole (PRILOSEC) 40 MG capsule, Take 1 capsule (40 mg total) by mouth daily., Disp: 30 capsule, Rfl: 0   oxyCODONE-acetaminophen (PERCOCET) 10-325 MG tablet, Take 1 tablet by mouth every 4 (four) hours as needed for pain., Disp: 135  tablet, Rfl: 0   [START ON 08/15/2023] oxyCODONE-acetaminophen (PERCOCET) 10-325 MG tablet, Take 1 tablet by mouth every 4 (four) hours as needed for pain., Disp: 135 tablet, Rfl: 0   RYBELSUS 3 MG TABS, Take 1 tablet by mouth daily., Disp: , Rfl:    Sod Picosulfate-Mag Ox-Cit Acd (CLENPIQ) 10-3.5-12 MG-GM -GM/160ML SOLN, Take 1 bottle at 5 PM followed by five 8 oz cups of water and repeat 5 hours before procedure., Disp: 320 mL, Rfl: 0   spironolactone (ALDACTONE) 25 MG tablet, TAKE 1 TABLET(25 MG) BY MOUTH DAILY, Disp: 30 tablet, Rfl: 0  Observations/Objective: Patient is well-developed, well-nourished in no acute distress.  Resting comfortably  at home.  Head is normocephalic, atraumatic.  No labored breathing.  Speech is clear and coherent with logical content.  Patient is alert and oriented at baseline.    Assessment and Plan: 1. Gastroenteritis - ondansetron (ZOFRAN-ODT) 8 MG disintegrating tablet; Take  1 tablet (8 mg total) by mouth every 8 (eight) hours as needed for nausea or vomiting.  Dispense: 20 tablet; Refill: 0  Stay well hydrated with clear liquids. Drink 1-2 oz every 20 mins as able to. Progress to [employer] Bananas, Toast, Applesauce, Rice cereal, etc then if tolerated progress to regular diet. Do not consume undercooked meats or raw fish until symptoms are completely resolved. Take over the counter Imodium for diarrhea as directed on the box. Watch for sings of worsening symptoms including fever, chills, etc. Schedule a face to face appointment if symptoms don't improve. Pt requested a work excuse note. Pt verbalized understanding and in agreement.     Follow Up Instructions: I discussed the assessment and treatment plan with the patient. The patient was provided an opportunity to ask questions and all were answered. The patient agreed with the plan and demonstrated an understanding of the instructions.  A copy of instructions were sent to the patient via MyChart unless otherwise noted below.   Patient has requested to receive PHI (AVS, Work Notes, etc) pertaining to this video visit through e-mail as they are currently without active MyChart. They have voiced understand that email is not considered secure and their health information could be viewed by someone other than the patient.   The patient was advised to call back or seek an in-person evaluation if the symptoms worsen or if the condition fails to improve as anticipated.    [name], PA-C

## 2023-07-27 NOTE — Patient Instructions (Signed)
Krista Kennedy, thank you for joining [name], PA-C for today's virtual visit.  While this provider is not your primary care provider (PCP), if your PCP is located in our provider database this encounter information will be shared with them immediately following your visit.   A [facility] MyChart account gives you access to today's visit and all your visits, tests, and labs performed at [facility] " click here if you don't have a [facility] MyChart account or go to [email or web address]  Consent: (Patient) Krista Kennedy provided verbal consent for this virtual visit at the beginning of the encounter.  Current Medications:  Current Outpatient Medications:    ondansetron (ZOFRAN-ODT) 8 MG disintegrating tablet, Take 1 tablet (8 mg total) by mouth every 8 (eight) hours as needed for nausea or vomiting., Disp: 20 tablet, Rfl: 0   albuterol (VENTOLIN HFA) 108 (90 Base) MCG/ACT inhaler, Inhale 2 puffs into the lungs every 6 (six) hours as needed for wheezing or shortness of breath., Disp: 1 each, Rfl: 0   Ascorbic Acid (VITAMIN C) 1000 MG tablet, Take 1,000 mg by mouth daily., Disp: , Rfl:    carvedilol (COREG) 25 MG tablet, Take 1 tablet (25 mg total) by mouth 2 (two) times daily. NO FURTHER REFILLS UNTIL SEEN IN CLINIC. MUST KEEP SCHEDULED APPOINTMENT., Disp: 180 tablet, Rfl: 0   Cholecalciferol 25 MCG (1000 UT) tablet, Take by mouth daily. , Disp: , Rfl:    clobetasol (TEMOVATE) 0.05 % external solution, Apply 1 application topically as needed. , Disp: , Rfl:    cyclobenzaprine (FLEXERIL) 10 MG tablet, Take 1 tablet (10 mg total) by mouth 2 (two) times daily., Disp: 30 tablet, Rfl: 2   estradiol (CLIMARA - DOSED IN MG/24 HR) 0.025 mg/24hr patch, Place 1 patch (0.025 mg total) onto the skin once a week., Disp: 4 patch, Rfl: 0   fluticasone (FLONASE) 50 MCG/ACT nasal spray, Place 2 sprays into both nostrils daily., Disp: 16 g, Rfl: 3   ibuprofen (ADVIL) 600 MG tablet, Take 1  tablet (600 mg total) by mouth every 6 (six) hours as needed., Disp: 30 tablet, Rfl: 0   Ixekizumab 80 MG/ML SOSY, Inject 80 mg into the skin as directed. [facility], Disp: , Rfl:    levothyroxine (SYNTHROID) 100 MCG tablet, Take 1 tablet (100 mcg total) by mouth daily before breakfast., Disp: 90 tablet, Rfl: 0   levothyroxine (SYNTHROID) 75 MCG tablet, Take 75 mcg by mouth daily., Disp: , Rfl:    lisinopril (ZESTRIL) 20 MG tablet, TAKE 1 TABLET(20 MG) BY MOUTH DAILY, Disp: 90 tablet, Rfl: 0   Multiple Vitamin (MULTI VITAMIN PO), Take 1 tablet by mouth daily., Disp: , Rfl:    naloxone (NARCAN) nasal spray 4 mg/0.1 mL, To reverse excess sedation from narcotics, Disp: 2 kit, Rfl: 1   omeprazole (PRILOSEC) 40 MG capsule, Take 1 capsule (40 mg total) by mouth daily., Disp: 30 capsule, Rfl: 0   oxyCODONE-acetaminophen (PERCOCET) 10-325 MG tablet, Take 1 tablet by mouth every 4 (four) hours as needed for pain., Disp: 135 tablet, Rfl: 0   [START ON 08/15/2023] oxyCODONE-acetaminophen (PERCOCET) 10-325 MG tablet, Take 1 tablet by mouth every 4 (four) hours as needed for pain., Disp: 135 tablet, Rfl: 0   RYBELSUS 3 MG TABS, Take 1 tablet by mouth daily., Disp: , Rfl:    Sod Picosulfate-Mag Ox-Cit Acd (CLENPIQ) 10-3.5-12 MG-GM -GM/160ML SOLN, Take 1 bottle at 5 PM followed by five 8 oz cups of water and repeat 5 hours  before procedure., Disp: 320 mL, Rfl: 0   spironolactone (ALDACTONE) 25 MG tablet, TAKE 1 TABLET(25 MG) BY MOUTH DAILY, Disp: 30 tablet, Rfl: 0   Medications ordered in this encounter:  Meds ordered this encounter  Medications   ondansetron (ZOFRAN-ODT) 8 MG disintegrating tablet    Sig: Take 1 tablet (8 mg total) by mouth every 8 (eight) hours as needed for nausea or vomiting.    Dispense:  20 tablet    Refill:  0    Order Specific Question:   Supervising Provider    Answer:   Merrilee Jansky [id number]     *If you need refills on other medications prior to your next appointment,  please contact your pharmacy*  Follow-Up: Call back or seek an in-person evaluation if the symptoms worsen or if the condition fails to improve as anticipated.  [facility] [phone]  Other Instructions Stay well hydrated with clear liquids. Drink 1-2 oz every 20 mins as able to. Progress to [employer] Bananas, Toast, Applesauce, Rice cereal, etc then if tolerated progress to regular diet. Do not consume undercooked meats or raw fish until symptoms are completely resolved. Take over the counter Imodium for diarrhea as directed on the box. Watch for sings of worsening symptoms including fever, chills, etc. Schedule a face to face appointment if symptoms don't improve.   If you have been instructed to have an in-person evaluation today at a local Urgent Care facility, please use the link below. It will take you to a list of all of our available [facility], including address, phone number and hours of operation. Please do not delay care.  [facility]  If you or a family member do not have a primary care provider, use the link below to schedule a visit and establish care. When you choose a [facility] primary care physician or advanced practice provider, you gain a long-term partner in health. Find a Primary Care Provider  Learn more about [facility]'s in-office and virtual care options: [facility] - Get Care Now

## 2023-07-31 ENCOUNTER — Other Ambulatory Visit: Payer: Self-pay | Admitting: Anesthesiology

## 2023-07-31 ENCOUNTER — Ambulatory Visit: Payer: Commercial Managed Care - PPO | Admitting: Anesthesiology

## 2023-07-31 ENCOUNTER — Encounter: Payer: Self-pay | Admitting: Anesthesiology

## 2023-07-31 ENCOUNTER — Ambulatory Visit
Admission: RE | Admit: 2023-07-31 | Discharge: 2023-07-31 | Disposition: A | Discharge status: Home / Self Care | Payer: Commercial Managed Care - PPO | Source: Ambulatory Visit | Attending: Anesthesiology | Admitting: Anesthesiology

## 2023-07-31 VITALS — BP 134/88 | HR 81 | Temp 97.4°F | Resp 16 | Ht 70.0 in | Wt 162.0 lb

## 2023-07-31 DIAGNOSIS — F1190 Opioid use, unspecified, uncomplicated: Secondary | ICD-10-CM

## 2023-07-31 DIAGNOSIS — M5432 Sciatica, left side: Secondary | ICD-10-CM

## 2023-07-31 DIAGNOSIS — L4050 Arthropathic psoriasis, unspecified: Secondary | ICD-10-CM | POA: Insufficient documentation

## 2023-07-31 DIAGNOSIS — M25519 Pain in unspecified shoulder: Secondary | ICD-10-CM | POA: Insufficient documentation

## 2023-07-31 DIAGNOSIS — R52 Pain, unspecified: Secondary | ICD-10-CM

## 2023-07-31 DIAGNOSIS — M25532 Pain in left wrist: Secondary | ICD-10-CM | POA: Insufficient documentation

## 2023-07-31 DIAGNOSIS — M5442 Lumbago with sciatica, left side: Secondary | ICD-10-CM | POA: Insufficient documentation

## 2023-07-31 DIAGNOSIS — M5441 Lumbago with sciatica, right side: Secondary | ICD-10-CM | POA: Diagnosis not present

## 2023-07-31 DIAGNOSIS — G8929 Other chronic pain: Secondary | ICD-10-CM | POA: Diagnosis present

## 2023-07-31 DIAGNOSIS — Z79891 Long term (current) use of opiate analgesic: Secondary | ICD-10-CM | POA: Diagnosis not present

## 2023-07-31 MED ORDER — SODIUM CHLORIDE (PF) 0.9 % IJ SOLN
INTRAMUSCULAR | Status: AC
  Filled 2023-07-31: qty 10

## 2023-07-31 MED ORDER — TRIAMCINOLONE ACETONIDE 40 MG/ML IJ SUSP
40.0000 mg | Freq: Once | INTRAMUSCULAR | Status: AC
  Administered 2023-07-31: 40 mg

## 2023-07-31 MED ORDER — LIDOCAINE HCL (PF) 1 % IJ SOLN
5.0000 mL | Freq: Once | INTRAMUSCULAR | Status: AC
  Administered 2023-07-31: 5 mL via SUBCUTANEOUS

## 2023-07-31 MED ORDER — ROPIVACAINE HCL 2 MG/ML IJ SOLN
INTRAMUSCULAR | Status: AC
  Filled 2023-07-31: qty 20

## 2023-07-31 MED ORDER — SODIUM CHLORIDE 0.9% FLUSH
10.0000 mL | Freq: Once | INTRAVENOUS | Status: AC
  Administered 2023-07-31: 10 mL

## 2023-07-31 MED ORDER — ROPIVACAINE HCL 2 MG/ML IJ SOLN
10.0000 mL | Freq: Once | INTRAMUSCULAR | Status: AC
  Administered 2023-07-31: 1 mL via EPIDURAL

## 2023-07-31 MED ORDER — LIDOCAINE HCL (PF) 1 % IJ SOLN
INTRAMUSCULAR | Status: AC
  Filled 2023-07-31: qty 10

## 2023-07-31 MED ORDER — IOHEXOL 180 MG/ML  SOLN
10.0000 mL | Freq: Once | INTRAMUSCULAR | Status: AC | PRN
  Administered 2023-07-31: 10 mL via EPIDURAL

## 2023-07-31 MED ORDER — TRIAMCINOLONE ACETONIDE 40 MG/ML IJ SUSP
INTRAMUSCULAR | Status: AC
  Filled 2023-07-31: qty 1

## 2023-07-31 MED ORDER — IOHEXOL 180 MG/ML  SOLN
INTRAMUSCULAR | Status: AC
  Filled 2023-07-31: qty 20

## 2023-07-31 NOTE — Progress Notes (Signed)
Subjective:  Patient ID: Krista Kennedy, female    DOB: May 29, 1966  Age: 57 y.o. MRN: [id number]  CC: Back Pain   Procedure: L3-L4 lumbar epidural steroid under fluoroscopic guidance with no sedation  HPI Krista Kennedy presents for reevaluation.  Krista Kennedy continues to have increased low back pain with radiation into the bilateral hips buttocks and occasionally posterior legs.  In the past she has had epidural steroid injections with significant improvement of her back pain rated at 75 to 80% relief and improvement in the hip and sciatica symptoms.  She presents today requesting repeat injection.  Her last injection was in October where she had an L5-S1 injection that was very effective for her.  She is still having some mild to moderate recurrence of low back pain and having less lower extremity sciatica symptoms but more anterior leg and hip and buttock pain.  Otherwise she is in her usual state of health.  She is taking her medications as prescribed and these continue to work well for her.  She generally gets about 60 to 80% relief with her opioid medications lasting about 4 to 6 hours for she has recurrence of her baseline pain.  The quality characteristic and distribution of her pain complex is stable without recent change.  Change in lower extremity strength or function is noted at this time.  Outpatient Medications Prior to Visit  Medication Sig Dispense Refill   albuterol (VENTOLIN HFA) 108 (90 Base) MCG/ACT inhaler Inhale 2 puffs into the lungs every 6 (six) hours as needed for wheezing or shortness of breath. 1 each 0   Ascorbic Acid (VITAMIN C) 1000 MG tablet Take 1,000 mg by mouth daily.     carvedilol (COREG) 25 MG tablet Take 1 tablet (25 mg total) by mouth 2 (two) times daily. NO FURTHER REFILLS UNTIL SEEN IN CLINIC. MUST KEEP SCHEDULED APPOINTMENT. 180 tablet 0   Cholecalciferol 25 MCG (1000 UT) tablet Take by mouth daily.      clobetasol (TEMOVATE) 0.05 % external solution Apply 1  application topically as needed.      cyclobenzaprine (FLEXERIL) 10 MG tablet Take 1 tablet (10 mg total) by mouth 2 (two) times daily. 30 tablet 2   estradiol (CLIMARA - DOSED IN MG/24 HR) 0.025 mg/24hr patch Place 1 patch (0.025 mg total) onto the skin once a week. 4 patch 0   fluticasone (FLONASE) 50 MCG/ACT nasal spray Place 2 sprays into both nostrils daily. 16 g 3   ibuprofen (ADVIL) 600 MG tablet Take 1 tablet (600 mg total) by mouth every 6 (six) hours as needed. 30 tablet 0   Ixekizumab 80 MG/ML SOSY Inject 80 mg into the skin as directed. [facility]     levothyroxine (SYNTHROID) 100 MCG tablet Take 1 tablet (100 mcg total) by mouth daily before breakfast. 90 tablet 0   levothyroxine (SYNTHROID) 75 MCG tablet Take 75 mcg by mouth daily.     lisinopril (ZESTRIL) 20 MG tablet TAKE 1 TABLET(20 MG) BY MOUTH DAILY 90 tablet 0   Multiple Vitamin (MULTI VITAMIN PO) Take 1 tablet by mouth daily.     naloxone (NARCAN) nasal spray 4 mg/0.1 mL To reverse excess sedation from narcotics 2 kit 1   omeprazole (PRILOSEC) 40 MG capsule Take 1 capsule (40 mg total) by mouth daily. 30 capsule 0   ondansetron (ZOFRAN-ODT) 8 MG disintegrating tablet Take 1 tablet (8 mg total) by mouth every 8 (eight) hours as needed for nausea or vomiting. 20 tablet 0  oxyCODONE-acetaminophen (PERCOCET) 10-325 MG tablet Take 1 tablet by mouth every 4 (four) hours as needed for pain. 135 tablet 0   [START ON 08/15/2023] oxyCODONE-acetaminophen (PERCOCET) 10-325 MG tablet Take 1 tablet by mouth every 4 (four) hours as needed for pain. 135 tablet 0   RYBELSUS 3 MG TABS Take 1 tablet by mouth daily.     Sod Picosulfate-Mag Ox-Cit Acd (CLENPIQ) 10-3.5-12 MG-GM -GM/160ML SOLN Take 1 bottle at 5 PM followed by five 8 oz cups of water and repeat 5 hours before procedure. 320 mL 0   spironolactone (ALDACTONE) 25 MG tablet TAKE 1 TABLET(25 MG) BY MOUTH DAILY 30 tablet 0   No facility-administered medications prior to visit.     Review of Systems CNS: No confusion or sedation Cardiac: No angina or palpitations GI: No abdominal pain or constipation Constitutional: No nausea vomiting fevers or chills  Objective:  BP 134/88   Pulse 81   Temp (!) 97.4 F (36.3 C)   Resp 16   Ht 5\' 10"  (1.778 m)   Wt 162 lb (73.5 kg)   SpO2 97%   BMI 23.24 kg/m    BP Readings from Last 3 Encounters:  07/31/23 134/88  06/06/23 (!) 143/76  08/14/22 136/72     Wt Readings from Last 3 Encounters:  07/31/23 162 lb (73.5 kg)  06/06/23 160 lb (72.6 kg)  08/14/22 170 lb (77.1 kg)     Physical Exam Pt is alert and oriented PERRL EOMI HEART IS RRR no murmur or rub LCTA no wheezing or rales MUSCULOSKELETAL reveals some paraspinous muscle tenderness but no overt trigger points.  She is walking with a mildly antalgic gait.  Muscle tone and bulk is at baseline.  Labs  Lab Results  Component Value Date   HGBA1C 6.4 (H) 05/06/2020   HGBA1C 6.7 (H) 07/20/2018   HGBA1C 5.9 (H) 04/11/2017   Lab Results  Component Value Date   LDLCALC 118 (H) 05/06/2020   CREATININE 1.29 (H) 09/04/2020    -------------------------------------------------------------------------------------------------------------------- Lab Results  Component Value Date   WBC 7.2 09/04/2020   HGB 13.4 09/04/2020   HCT 40.6 09/04/2020   PLT 270 09/04/2020   GLUCOSE 117 (H) 09/04/2020   CHOL 200 (H) 05/06/2020   TRIG 122 05/06/2020   HDL 60 05/06/2020   LDLCALC 118 (H) 05/06/2020   ALT 16 05/06/2020   AST 24 05/06/2020   NA 136 09/04/2020   K 3.9 09/04/2020   CL 97 (L) 09/04/2020   CREATININE 1.29 (H) 09/04/2020   BUN 24 (H) 09/04/2020   CO2 28 09/04/2020   TSH 0.130 (L) 05/06/2020   HGBA1C 6.4 (H) 05/06/2020    --------------------------------------------------------------------------------------------------------------------- DG PAIN CLINIC C-ARM 1-60 MIN NO REPORT  Result Date: 07/31/2023 Fluoro was used, but no Radiologist  interpretation will be provided. Please refer to "NOTES" tab for provider progress note.    Assessment & Plan:   Cheyann was seen today for back pain.  Diagnoses and all orders for this visit:  Chronic bilateral low back pain with bilateral sciatica -     triamcinolone acetonide (KENALOG-40) injection 40 mg -     sodium chloride flush (NS) 0.9 % injection 10 mL -     ropivacaine (PF) 2 mg/mL (0.2%) (NAROPIN) injection 10 mL -     lidocaine (PF) (XYLOCAINE) 1 % injection 5 mL -     iohexol (OMNIPAQUE) 180 MG/ML injection 10 mL  Sciatica of left side  Chronic left hip pain -  triamcinolone acetonide (KENALOG-40) injection 40 mg -     sodium chloride flush (NS) 0.9 % injection 10 mL -     ropivacaine (PF) 2 mg/mL (0.2%) (NAROPIN) injection 10 mL -     lidocaine (PF) (XYLOCAINE) 1 % injection 5 mL -     iohexol (OMNIPAQUE) 180 MG/ML injection 10 mL  Chronic, continuous use of opioids  Psoriatic arthritis (HCC)  Chronic pain of both shoulders  Left wrist pain        ----------------------------------------------------------------------------------------------------------------------  Problem List Items Addressed This Visit       Unprioritized   Chronic hip pain (Chronic)   Chronic low back pain - Primary (Chronic)   Left wrist pain (Chronic)   Chronic shoulder pain   Chronic, continuous use of opioids   Sciatica of left side   Other Visit Diagnoses     Psoriatic arthritis (HCC)       Relevant Medications   triamcinolone acetonide (KENALOG-40) injection 40 mg (Completed)         ----------------------------------------------------------------------------------------------------------------------  1. Chronic bilateral low back pain with bilateral sciatica Will proceed with a repeat epidural but were going to switch from the L5-S1 interspace to the L3-4 interspace to see if we get some more anterior thigh and hip coverage.  We talked about the risks and  benefits of the procedure she understands these well desires to proceed.  I want her to continue with physical therapy exercises.  No other changes in her pharmacologic regimen will be initiated. - triamcinolone acetonide (KENALOG-40) injection 40 mg - sodium chloride flush (NS) 0.9 % injection 10 mL - ropivacaine (PF) 2 mg/mL (0.2%) (NAROPIN) injection 10 mL - lidocaine (PF) (XYLOCAINE) 1 % injection 5 mL - iohexol (OMNIPAQUE) 180 MG/ML injection 10 mL  2. Sciatica of left side As above  3. Chronic left hip pain As above - triamcinolone acetonide (KENALOG-40) injection 40 mg - sodium chloride flush (NS) 0.9 % injection 10 mL - ropivacaine (PF) 2 mg/mL (0.2%) (NAROPIN) injection 10 mL - lidocaine (PF) (XYLOCAINE) 1 % injection 5 mL - iohexol (OMNIPAQUE) 180 MG/ML injection 10 mL  4. Chronic, continuous use of opioids I have reviewed the [facility] practitioner database information is appropriate.  Refill will be generated for January 9  5. Psoriatic arthritis (HCC) As above with schedule return to clinic in approximately 1 month with possible repeat epidural if indicated at that time.  6. Chronic pain of both shoulders   7. Left wrist pain     ----------------------------------------------------------------------------------------------------------------------  I am having Elizebeth Tuel maintain her vitamin C, Cholecalciferol, ixekizumab, clobetasol, naloxone, fluticasone, Multiple Vitamin (MULTI VITAMIN PO), estradiol, Clenpiq, omeprazole, albuterol, spironolactone, lisinopril, levothyroxine, carvedilol, ibuprofen, levothyroxine, Rybelsus, oxyCODONE-acetaminophen, oxyCODONE-acetaminophen, cyclobenzaprine, and ondansetron. We administered triamcinolone acetonide, sodium chloride flush, ropivacaine (PF) 2 mg/mL (0.2%), lidocaine (PF), and iohexol.   Meds ordered this encounter  Medications   triamcinolone acetonide (KENALOG-40) injection 40 mg   sodium chloride flush (NS)  0.9 % injection 10 mL   ropivacaine (PF) 2 mg/mL (0.2%) (NAROPIN) injection 10 mL   lidocaine (PF) (XYLOCAINE) 1 % injection 5 mL   iohexol (OMNIPAQUE) 180 MG/ML injection 10 mL   Patient's Medications  New Prescriptions   No medications on file  Previous Medications   ALBUTEROL (VENTOLIN HFA) 108 (90 BASE) MCG/ACT INHALER    Inhale 2 puffs into the lungs every 6 (six) hours as needed for wheezing or shortness of breath.   ASCORBIC ACID (VITAMIN C) 1000 MG TABLET  Take 1,000 mg by mouth daily.   CARVEDILOL (COREG) 25 MG TABLET    Take 1 tablet (25 mg total) by mouth 2 (two) times daily. NO FURTHER REFILLS UNTIL SEEN IN CLINIC. MUST KEEP SCHEDULED APPOINTMENT.   CHOLECALCIFEROL 25 MCG (1000 UT) TABLET    Take by mouth daily.    CLOBETASOL (TEMOVATE) 0.05 % EXTERNAL SOLUTION    Apply 1 application topically as needed.    CYCLOBENZAPRINE (FLEXERIL) 10 MG TABLET    Take 1 tablet (10 mg total) by mouth 2 (two) times daily.   ESTRADIOL (CLIMARA - DOSED IN MG/24 HR) 0.025 MG/24HR PATCH    Place 1 patch (0.025 mg total) onto the skin once a week.   FLUTICASONE (FLONASE) 50 MCG/ACT NASAL SPRAY    Place 2 sprays into both nostrils daily.   IBUPROFEN (ADVIL) 600 MG TABLET    Take 1 tablet (600 mg total) by mouth every 6 (six) hours as needed.   IXEKIZUMAB 80 MG/ML SOSY    Inject 80 mg into the skin as directed. [facility]   LEVOTHYROXINE (SYNTHROID) 100 MCG TABLET    Take 1 tablet (100 mcg total) by mouth daily before breakfast.   LEVOTHYROXINE (SYNTHROID) 75 MCG TABLET    Take 75 mcg by mouth daily.   LISINOPRIL (ZESTRIL) 20 MG TABLET    TAKE 1 TABLET(20 MG) BY MOUTH DAILY   MULTIPLE VITAMIN (MULTI VITAMIN PO)    Take 1 tablet by mouth daily.   NALOXONE (NARCAN) NASAL SPRAY 4 MG/0.1 ML    To reverse excess sedation from narcotics   OMEPRAZOLE (PRILOSEC) 40 MG CAPSULE    Take 1 capsule (40 mg total) by mouth daily.   ONDANSETRON (ZOFRAN-ODT) 8 MG DISINTEGRATING TABLET    Take 1 tablet (8 mg total)  by mouth every 8 (eight) hours as needed for nausea or vomiting.   OXYCODONE-ACETAMINOPHEN (PERCOCET) 10-325 MG TABLET    Take 1 tablet by mouth every 4 (four) hours as needed for pain.   OXYCODONE-ACETAMINOPHEN (PERCOCET) 10-325 MG TABLET    Take 1 tablet by mouth every 4 (four) hours as needed for pain.   RYBELSUS 3 MG TABS    Take 1 tablet by mouth daily.   SOD PICOSULFATE-MAG OX-CIT ACD (CLENPIQ) 10-3.5-12 MG-GM -GM/160ML SOLN    Take 1 bottle at 5 PM followed by five 8 oz cups of water and repeat 5 hours before procedure.   SPIRONOLACTONE (ALDACTONE) 25 MG TABLET    TAKE 1 TABLET(25 MG) BY MOUTH DAILY  Modified Medications   No medications on file  Discontinued Medications   No medications on file   ----------------------------------------------------------------------------------------------------------------------  Follow-up: Return in about 1 month (around 08/30/2023) for evaluation, med refill.   Procedure: L3-L4 LESI with fluoroscopic guidance and no moderate sedation  NOTE: The risks, benefits, and expectations of the procedure have been discussed and explained to the patient who was understanding and in agreement with suggested treatment plan. No guarantees were made.  DESCRIPTION OF PROCEDURE: Lumbar epidural steroid injection with no IV Versed, EKG, blood pressure, pulse, and pulse oximetry monitoring. The procedure was performed with the patient in the prone position under fluoroscopic guidance.  Sterile prep x3 was initiated and I then injected subcutaneous lidocaine to the overlying L3-L4 site after its fluoroscopic identifictation.  Using strict aseptic technique, I then advanced an 18-gauge Tuohy epidural needle in the midline using interlaminar approach via loss-of-resistance to saline technique. There was negative aspiration for heme or  CSF.  I  then confirmed position with both AP and Lateral fluoroscan.  2 cc of contrast dye were injected and a  total of 5 mL of  Preservative-Free normal saline mixed with 40 mg of Kenalog and 1cc Ropicaine 0.2 percent were injected incrementally via the  epidurally placed needle. The needle was removed. The patient tolerated the injection well and was convalesced and discharged to home in stable condition. Should the patient have any post procedure difficulty they have been instructed on how to contact us for assistance.   Yevette Edwards, MD

## 2023-07-31 NOTE — Patient Instructions (Signed)
Pain Management Discharge Instructions  General Discharge Instructions :  If you need to reach your doctor call: Monday-Friday 8:00 am - 4:00 pm at [phone] or toll free [phone].  After clinic hours [phone] to have operator reach doctor.  Bring all of your medication bottles to all your appointments in the pain clinic.  To cancel or reschedule your appointment with Pain Management please remember to call 24 hours in advance to avoid a fee.  Refer to the educational materials which you have been given on: General Risks, I had my Procedure. Discharge Instructions, Post Sedation.  Post Procedure Instructions:  The drugs you were given will stay in your system until tomorrow, so for the next 24 hours you should not drive, make any legal decisions or drink any alcoholic beverages.  You may eat anything you prefer, but it is better to start with liquids then soups and crackers, and gradually work up to solid foods.  Please notify your doctor immediately if you have any unusual bleeding, trouble breathing or pain that is not related to your normal pain.  Depending on the type of procedure that was done, some parts of your body may feel week and/or numb.  This usually clears up by tonight or the next day.  Walk with the use of an assistive device or accompanied by an adult for the 24 hours.  You may use ice on the affected area for the first 24 hours.  Put ice in a Ziploc bag and cover with a towel and place against area 15 minutes on 15 minutes off.  You may switch to heat after 24 hours.Epidural Steroid Injection Patient Information  Description: The epidural space surrounds the nerves as they exit the spinal cord.  In some patients, the nerves can be compressed and inflamed by a bulging disc or a tight spinal canal (spinal stenosis).  By injecting steroids into the epidural space, we can bring irritated nerves into direct contact with a potentially helpful medication.  These  steroids act directly on the irritated nerves and can reduce swelling and inflammation which often leads to decreased pain.  Epidural steroids may be injected anywhere along the spine and from the neck to the low back depending upon the location of your pain.   After numbing the skin with local anesthetic (like Novocaine), a small needle is passed into the epidural space slowly.  You may experience a sensation of pressure while this is being done.  The entire block usually last less than 10 minutes.  Conditions which may be treated by epidural steroids:  Low back and leg pain Neck and arm pain Spinal stenosis Post-laminectomy syndrome Herpes zoster (shingles) pain Pain from compression fractures  Preparation for the injection:  Do not eat any solid food or dairy products within 8 hours of your appointment.  You may drink clear liquids up to 3 hours before appointment.  Clear liquids include water, black coffee, juice or soda.  No milk or cream please. You may take your regular medication, including pain medications, with a sip of water before your appointment  Diabetics should hold regular insulin (if taken separately) and take 1/2 normal NPH dos the morning of the procedure.  Carry some sugar containing items with you to your appointment. A driver must accompany you and be prepared to drive you home after your procedure.  Bring all your current medications with your. An IV may be inserted and sedation may be given at the discretion of the physician.  A blood pressure cuff, EKG and other monitors will often be applied during the procedure.  Some patients may need to have extra oxygen administered for a short period. You will be asked to provide medical information, including your allergies, prior to the procedure.  We must know immediately if you are taking blood thinners (like Coumadin/Warfarin)  Or if you are allergic to IV iodine contrast (dye). We must know if you could possible be  pregnant.  Possible side-effects: Bleeding from needle site Infection (rare, may require surgery) Nerve injury (rare) Numbness & tingling (temporary) Difficulty urinating (rare, temporary) Spinal headache ( a headache worse with upright posture) Light -headedness (temporary) Pain at injection site (several days) Decreased blood pressure (temporary) Weakness in arm/leg (temporary) Pressure sensation in back/neck (temporary)  Call if you experience: Fever/chills associated with headache or increased back/neck pain. Headache worsened by an upright position. New onset weakness or numbness of an extremity below the injection site Hives or difficulty breathing (go to the emergency room) Inflammation or drainage at the infection site Severe back/neck pain Any new symptoms which are concerning to you  Please note:  Although the local anesthetic injected can often make your back or neck feel good for several hours after the injection, the pain will likely return.  It takes 3-7 days for steroids to work in the epidural space.  You may not notice any pain relief for at least that one week.  If effective, we will often do a series of three injections spaced 3-6 weeks apart to maximally decrease your pain.  After the initial series, we generally will wait several months before considering a repeat injection of the same type.  If you have any questions, please call [phone] [facility]

## 2023-07-31 NOTE — Progress Notes (Signed)
Safety precautions to be maintained throughout the outpatient stay will include: orient to surroundings, keep bed in low position, maintain call bell within reach at all times, provide assistance with transfer out of bed and ambulation.  

## 2023-08-15 ENCOUNTER — Telehealth: Payer: Self-pay | Admitting: Anesthesiology

## 2023-08-15 ENCOUNTER — Other Ambulatory Visit: Payer: Self-pay

## 2023-08-15 NOTE — Telephone Encounter (Signed)
Oxycodone -- her pharmacy does not have enough to fill script she wants to see if Dr Pernell Dupre will send to [facility] in [place]. Please call patient when this gets done.

## 2023-08-15 NOTE — Telephone Encounter (Signed)
Refill request sent to Dr Pernell Dupre

## 2023-08-16 MED ORDER — OXYCODONE-ACETAMINOPHEN 10-325 MG PO TABS: 1.0000 | ORAL_TABLET | ORAL | 0 refills | Status: AC | PRN

## 2023-08-28 ENCOUNTER — Telehealth: Payer: Commercial Managed Care - PPO | Admitting: Anesthesiology

## 2023-08-28 NOTE — Progress Notes (Unsigned)
Virtual Visit via Telephone Note  I connected with Krista Kennedy on 08/28/23 at 10:00 AM EST by telephone and verified that I am speaking with the correct person using two identifiers.  Location: Patient: Home Provider: [facility] control center   I discussed the limitations, risks, security and privacy concerns of performing an evaluation and management service by telephone and the availability of in person appointments. I also discussed with the patient that there may be a patient responsible charge related to this service. The patient expressed understanding and agreed to proceed.   History of Present Illness: I spoke with Krista Kennedy via telephone as we were unable to link for the video portion of the conference.  She reports that she did very well with her most recent epidural.  Her sciatica symptoms are about 75 to 80% better and she would rate her low back pain improvement about the same.  She did much better with the L3-L4 epidural than the L5-S1 epidural back in October.  She is trying to stay active and doing some stretching exercises.  She continues to take her medications as prescribed for generalized pain.  The quality characteristic and distribution of her osteoarthritis and rheumatoid arthritis pain is stable.  She is using her medications about every 4 hours generally getting about 75% reduction in pain lasting about 4 hours to 6 hours.  She averages 135 tablets/month without side effect.  Otherwise she is in her usual state of health with no new changes in lower extremity strength function bowel or bladder function.  Review of systems: General: No fevers or chills Pulmonary: No shortness of breath or dyspnea Cardiac: No angina or palpitations or lightheadedness GI: No abdominal pain or constipation Psych: No depression    Observations/Objective:  Current Outpatient Medications:    albuterol (VENTOLIN HFA) 108 (90 Base) MCG/ACT inhaler, Inhale 2 puffs into the lungs every 6 (six)  hours as needed for wheezing or shortness of breath., Disp: 1 each, Rfl: 0   Ascorbic Acid (VITAMIN C) 1000 MG tablet, Take 1,000 mg by mouth daily., Disp: , Rfl:    carvedilol (COREG) 25 MG tablet, Take 1 tablet (25 mg total) by mouth 2 (two) times daily. NO FURTHER REFILLS UNTIL SEEN IN CLINIC. MUST KEEP SCHEDULED APPOINTMENT., Disp: 180 tablet, Rfl: 0   Cholecalciferol 25 MCG (1000 UT) tablet, Take by mouth daily. , Disp: , Rfl:    clobetasol (TEMOVATE) 0.05 % external solution, Apply 1 application topically as needed. , Disp: , Rfl:    estradiol (CLIMARA - DOSED IN MG/24 HR) 0.025 mg/24hr patch, Place 1 patch (0.025 mg total) onto the skin once a week., Disp: 4 patch, Rfl: 0   fluticasone (FLONASE) 50 MCG/ACT nasal spray, Place 2 sprays into both nostrils daily., Disp: 16 g, Rfl: 3   ibuprofen (ADVIL) 600 MG tablet, Take 1 tablet (600 mg total) by mouth every 6 (six) hours as needed., Disp: 30 tablet, Rfl: 0   Ixekizumab 80 MG/ML SOSY, Inject 80 mg into the skin as directed. [facility], Disp: , Rfl:    levothyroxine (SYNTHROID) 100 MCG tablet, Take 1 tablet (100 mcg total) by mouth daily before breakfast., Disp: 90 tablet, Rfl: 0   levothyroxine (SYNTHROID) 75 MCG tablet, Take 75 mcg by mouth daily., Disp: , Rfl:    lisinopril (ZESTRIL) 20 MG tablet, TAKE 1 TABLET(20 MG) BY MOUTH DAILY, Disp: 90 tablet, Rfl: 0   Multiple Vitamin (MULTI VITAMIN PO), Take 1 tablet by mouth daily., Disp: , Rfl:  naloxone (NARCAN) nasal spray 4 mg/0.1 mL, To reverse excess sedation from narcotics, Disp: 2 kit, Rfl: 1   omeprazole (PRILOSEC) 40 MG capsule, Take 1 capsule (40 mg total) by mouth daily., Disp: 30 capsule, Rfl: 0   ondansetron (ZOFRAN-ODT) 8 MG disintegrating tablet, Take 1 tablet (8 mg total) by mouth every 8 (eight) hours as needed for nausea or vomiting., Disp: 20 tablet, Rfl: 0   oxyCODONE-acetaminophen (PERCOCET) 10-325 MG tablet, Take 1 tablet by mouth every 4 (four) hours as needed for pain.,  Disp: 135 tablet, Rfl: 0   RYBELSUS 3 MG TABS, Take 1 tablet by mouth daily., Disp: , Rfl:    Sod Picosulfate-Mag Ox-Cit Acd (CLENPIQ) 10-3.5-12 MG-GM -GM/160ML SOLN, Take 1 bottle at 5 PM followed by five 8 oz cups of water and repeat 5 hours before procedure., Disp: 320 mL, Rfl: 0   spironolactone (ALDACTONE) 25 MG tablet, TAKE 1 TABLET(25 MG) BY MOUTH DAILY, Disp: 30 tablet, Rfl: 0   Past Medical History:  Diagnosis Date   Arthritis    Psoriatic   Cardiomyopathy (HCC)    CHF (congestive heart failure) (HCC)    DDD (degenerative disc disease), lumbar 08/02/2021   Hyperlipidemia    Hypertension    Psoriasis    Sciatica of left side 08/02/2021   Thyroid disease    Transient weakness of left leg 08/13/2019   Wears contact lenses    Assessment and Plan: 1. Chronic bilateral low back pain with bilateral sciatica   2. Sciatica of left side   3. Chronic left hip pain   4. Chronic, continuous use of opioids   5. Psoriatic arthritis (HCC)   6. Chronic pain of both shoulders   7. Left wrist pain   8. Chronic pain syndrome    Based on our conversation it is appropriate to refill her medicines for the next 2 months dated for January 10 and February 9.  I have encouraged her to stay active with her current stretching exercises especially while she is having good relief from the epidural.  Continue with core strengthening.  No other changes in her regimen will be initiated.  Will defer on any other repeat injections at this time.  Continue follow-up with her primary care physicians for baseline medical care with schedule return to clinic in 2 months.  Follow Up Instructions:    I discussed the assessment and treatment plan with the patient. The patient was provided an opportunity to ask questions and all were answered. The patient agreed with the plan and demonstrated an understanding of the instructions.   The patient was advised to call back or seek an in-person evaluation if the symptoms  worsen or if the condition fails to improve as anticipated.  I provided 30 minutes of non-face-to-face time during this encounter.   Yevette Edwards, MD

## 2023-08-29 ENCOUNTER — Ambulatory Visit: Payer: Commercial Managed Care - PPO | Attending: Anesthesiology | Admitting: Anesthesiology

## 2023-08-29 ENCOUNTER — Encounter: Payer: Self-pay | Admitting: Anesthesiology

## 2023-08-29 DIAGNOSIS — L4050 Arthropathic psoriasis, unspecified: Secondary | ICD-10-CM

## 2023-08-29 DIAGNOSIS — M5442 Lumbago with sciatica, left side: Secondary | ICD-10-CM | POA: Diagnosis not present

## 2023-08-29 DIAGNOSIS — G894 Chronic pain syndrome: Secondary | ICD-10-CM

## 2023-08-29 DIAGNOSIS — M5441 Lumbago with sciatica, right side: Secondary | ICD-10-CM

## 2023-08-29 DIAGNOSIS — M25552 Pain in left hip: Secondary | ICD-10-CM

## 2023-08-29 DIAGNOSIS — F1190 Opioid use, unspecified, uncomplicated: Secondary | ICD-10-CM

## 2023-08-29 DIAGNOSIS — M5432 Sciatica, left side: Secondary | ICD-10-CM

## 2023-08-29 DIAGNOSIS — M25511 Pain in right shoulder: Secondary | ICD-10-CM

## 2023-08-29 DIAGNOSIS — M25532 Pain in left wrist: Secondary | ICD-10-CM

## 2023-08-29 DIAGNOSIS — M25512 Pain in left shoulder: Secondary | ICD-10-CM

## 2023-08-29 DIAGNOSIS — Z79891 Long term (current) use of opiate analgesic: Secondary | ICD-10-CM

## 2023-08-29 DIAGNOSIS — G8929 Other chronic pain: Secondary | ICD-10-CM

## 2023-08-29 MED ORDER — OXYCODONE-ACETAMINOPHEN 10-325 MG PO TABS: 1.0000 | ORAL_TABLET | ORAL | 0 refills | Status: AC | PRN

## 2023-09-21 ENCOUNTER — Telehealth: Payer: Commercial Managed Care - PPO | Admitting: Physician Assistant

## 2023-09-21 DIAGNOSIS — B89 Unspecified parasitic disease: Secondary | ICD-10-CM

## 2023-09-21 NOTE — Patient Instructions (Signed)
Abbigael Cordial, thank you for joining [name], PA-C for today's virtual visit.  While this provider is not your primary care provider (PCP), if your PCP is located in our provider database this encounter information will be shared with them immediately following your visit.   A [facility] MyChart account gives you access to today's visit and all your visits, tests, and labs performed at [facility] " click here if you don't have a [facility] MyChart account or go to [email or web address]  Consent: (Patient) Krista Kennedy provided verbal consent for this virtual visit at the beginning of the encounter.  Current Medications:  Current Outpatient Medications:    albuterol (VENTOLIN HFA) 108 (90 Base) MCG/ACT inhaler, Inhale 2 puffs into the lungs every 6 (six) hours as needed for wheezing or shortness of breath., Disp: 1 each, Rfl: 0   Ascorbic Acid (VITAMIN C) 1000 MG tablet, Take 1,000 mg by mouth daily., Disp: , Rfl:    carvedilol (COREG) 25 MG tablet, Take 1 tablet (25 mg total) by mouth 2 (two) times daily. NO FURTHER REFILLS UNTIL SEEN IN CLINIC. MUST KEEP SCHEDULED APPOINTMENT., Disp: 180 tablet, Rfl: 0   Cholecalciferol 25 MCG (1000 UT) tablet, Take by mouth daily. , Disp: , Rfl:    clobetasol (TEMOVATE) 0.05 % external solution, Apply 1 application topically as needed. , Disp: , Rfl:    estradiol (CLIMARA - DOSED IN MG/24 HR) 0.025 mg/24hr patch, Place 1 patch (0.025 mg total) onto the skin once a week., Disp: 4 patch, Rfl: 0   fluticasone (FLONASE) 50 MCG/ACT nasal spray, Place 2 sprays into both nostrils daily., Disp: 16 g, Rfl: 3   ibuprofen (ADVIL) 600 MG tablet, Take 1 tablet (600 mg total) by mouth every 6 (six) hours as needed., Disp: 30 tablet, Rfl: 0   Ixekizumab 80 MG/ML SOSY, Inject 80 mg into the skin as directed. [facility], Disp: , Rfl:    levothyroxine (SYNTHROID) 100 MCG tablet, Take 1 tablet (100 mcg total) by mouth daily before breakfast.,  Disp: 90 tablet, Rfl: 0   levothyroxine (SYNTHROID) 75 MCG tablet, Take 75 mcg by mouth daily., Disp: , Rfl:    lisinopril (ZESTRIL) 20 MG tablet, TAKE 1 TABLET(20 MG) BY MOUTH DAILY, Disp: 90 tablet, Rfl: 0   Multiple Vitamin (MULTI VITAMIN PO), Take 1 tablet by mouth daily., Disp: , Rfl:    naloxone (NARCAN) nasal spray 4 mg/0.1 mL, To reverse excess sedation from narcotics, Disp: 2 kit, Rfl: 1   omeprazole (PRILOSEC) 40 MG capsule, Take 1 capsule (40 mg total) by mouth daily., Disp: 30 capsule, Rfl: 0   ondansetron (ZOFRAN-ODT) 8 MG disintegrating tablet, Take 1 tablet (8 mg total) by mouth every 8 (eight) hours as needed for nausea or vomiting., Disp: 20 tablet, Rfl: 0   oxyCODONE-acetaminophen (PERCOCET) 10-325 MG tablet, Take 1 tablet by mouth every 4 (four) hours as needed for pain., Disp: 135 tablet, Rfl: 0   [START ON 10/15/2023] oxyCODONE-acetaminophen (PERCOCET) 10-325 MG tablet, Take 1 tablet by mouth every 4 (four) hours as needed for pain., Disp: 135 tablet, Rfl: 0   RYBELSUS 3 MG TABS, Take 1 tablet by mouth daily., Disp: , Rfl:    Sod Picosulfate-Mag Ox-Cit Acd (CLENPIQ) 10-3.5-12 MG-GM -GM/160ML SOLN, Take 1 bottle at 5 PM followed by five 8 oz cups of water and repeat 5 hours before procedure., Disp: 320 mL, Rfl: 0   spironolactone (ALDACTONE) 25 MG tablet, TAKE 1 TABLET(25 MG) BY MOUTH DAILY, Disp: 30  tablet, Rfl: 0   Medications ordered in this encounter:  No orders of the defined types were placed in this encounter.    *If you need refills on other medications prior to your next appointment, please contact your pharmacy*  Follow-Up: Call back or seek an in-person evaluation if the symptoms worsen or if the condition fails to improve as anticipated.  [facility] [phone]  Other Instructions Please use link below to get scheduled for an in-person evaluation.   [facility]  If you or a family member do not have a primary care provider, use  the link below to schedule a visit and establish care. When you choose a [facility] primary care physician or advanced practice provider, you gain a long-term partner in health. Find a Primary Care Provider  Learn more about [facility]'s in-office and virtual care options:  - Get Care Now

## 2023-09-21 NOTE — Progress Notes (Signed)
Virtual Visit Consent   Krista Kennedy, you are scheduled for a virtual visit with a [facility] provider today. Just as with appointments in the office, your consent must be obtained to participate. Your consent will be active for this visit and any virtual visit you may have with one of our providers in the next 365 days. If you have a MyChart account, a copy of this consent can be sent to you electronically.  As this is a virtual visit, video technology does not allow for your provider to perform a traditional examination. This may limit your provider's ability to fully assess your condition. If your provider identifies any concerns that need to be evaluated in person or the need to arrange testing (such as labs, EKG, etc.), we will make arrangements to do so. Although advances in technology are sophisticated, we cannot ensure that it will always work on either your end or our end. If the connection with a video visit is poor, the visit may have to be switched to a telephone visit. With either a video or telephone visit, we are not always able to ensure that we have a secure connection.  By engaging in this virtual visit, you consent to the provision of healthcare and authorize for your insurance to be billed (if applicable) for the services provided during this visit. Depending on your insurance coverage, you may receive a charge related to this service.  I need to obtain your verbal consent now. Are you willing to proceed with your visit today? Krista Kennedy has provided verbal consent on 09/21/2023 for a virtual visit (video or telephone). Krista Kennedy, [place]  Date: 09/21/2023 8:28 AM  Virtual Visit via Video Note   I, Krista Kennedy, connected with  Krista Kennedy  ([id number], May 07, 1966) on 09/21/23 at  8:30 AM EST by a video-enabled telemedicine application and verified that I am speaking with the correct person using two identifiers.  Location: Patient: Virtual Visit Location  Patient: Home Provider: Virtual Visit Location Provider: Home Office   I discussed the limitations of evaluation and management by telemedicine and the availability of in person appointments. The patient expressed understanding and agreed to proceed.    History of Present Illness: Krista Kennedy is a 58 y.o. who identifies as a female who was assigned female at birth, and is being seen today for reoccurring issue with possible intestinal parasites. Patient notes back in November -- started with tiny white threads in her stool with anal itching and nausea. Was evaluated by her PCP with concern for pinworms. Recommended OTC medication for this -- Made her very sick and threw up the medication.  Continued symptoms so was seen at outside [facility] in December and prescribed 2 rounds of Albendazole which she endorses taking as directed. Notes clearing of her stool and no issue in the past month up until this week, but noted some small white pieces in her stool. Denies fever, chills, but notes nausea and abdominal pain, mainly upper. Denies blood in stool.    HPI: HPI  Problems:  Patient Active Problem List   Diagnosis Date Noted   Sciatica of left side 08/02/2021   DDD (degenerative disc disease), lumbar 08/02/2021   Encounter for screening colonoscopy    Gastric erythema    Nausea and vomiting    Transient weakness of left leg 08/13/2019   Recurrent major depressive disorder, in partial remission (HCC) 06/26/2019   Chronic, continuous use of opioids 02/19/2019   History of cardiomyopathy 08/21/2018  Shortness of breath 08/21/2018   Palpitations 08/21/2018   Chronic shoulder pain 06/14/2017   Encounter for long-term (current) use of other medications 05/24/2017   Left wrist pain 04/11/2017   Chronic sacroiliac joint pain 04/11/2017   Hyperlipidemia 03/15/2017   Elevated hemoglobin A1c 03/15/2017   Therapeutic opioid induced constipation 02/02/2017   Chronic pain syndrome 04/07/2016   Chronic  hip pain 04/07/2016   Pain in multiple finger joints 04/07/2016   Allergic rhinitis due to pollen 09/08/2015   Other long term (current) drug therapy 08/13/2015   Depression 08/06/2015   Opioid dependence, daily use (HCC) 08/06/2015   High risk medications (not anticoagulants) long-term use 02/11/2015   Dysmenorrhea 10/29/2013   Congestive heart failure (HCC) 10/29/2013   Menorrhagia 10/15/2013   Fibroid 10/10/2013   Hyperglycemia 08/22/2013   Hypertension 04/30/2013   Psoriasis 04/30/2013   Metrorrhagia 12/07/2011   Chronic low back pain 11/02/2011   GERD (gastroesophageal reflux disease) 07/12/2010   Hypothyroidism 07/12/2010   Psoriasis with arthropathy (HCC) 07/12/2010   Avitaminosis D 07/12/2010   Abnormal mammogram 07/12/2010   Breast screening 07/12/2010   Encounter for routine gynecological examination 07/12/2010   Insomnia 07/12/2010   Vitamin D deficiency 07/12/2010    Allergies:  Allergies  Allergen Reactions   Other     Hair dye    Sulfa Antibiotics Hives    Rash    Medications:  Current Outpatient Medications:    albuterol (VENTOLIN HFA) 108 (90 Base) MCG/ACT inhaler, Inhale 2 puffs into the lungs every 6 (six) hours as needed for wheezing or shortness of breath., Disp: 1 each, Rfl: 0   Ascorbic Acid (VITAMIN C) 1000 MG tablet, Take 1,000 mg by mouth daily., Disp: , Rfl:    carvedilol (COREG) 25 MG tablet, Take 1 tablet (25 mg total) by mouth 2 (two) times daily. NO FURTHER REFILLS UNTIL SEEN IN CLINIC. MUST KEEP SCHEDULED APPOINTMENT., Disp: 180 tablet, Rfl: 0   Cholecalciferol 25 MCG (1000 UT) tablet, Take by mouth daily. , Disp: , Rfl:    clobetasol (TEMOVATE) 0.05 % external solution, Apply 1 application topically as needed. , Disp: , Rfl:    estradiol (CLIMARA - DOSED IN MG/24 HR) 0.025 mg/24hr patch, Place 1 patch (0.025 mg total) onto the skin once a week., Disp: 4 patch, Rfl: 0   fluticasone (FLONASE) 50 MCG/ACT nasal spray, Place 2 sprays into both  nostrils daily., Disp: 16 g, Rfl: 3   ibuprofen (ADVIL) 600 MG tablet, Take 1 tablet (600 mg total) by mouth every 6 (six) hours as needed., Disp: 30 tablet, Rfl: 0   Ixekizumab 80 MG/ML SOSY, Inject 80 mg into the skin as directed. [facility], Disp: , Rfl:    levothyroxine (SYNTHROID) 100 MCG tablet, Take 1 tablet (100 mcg total) by mouth daily before breakfast., Disp: 90 tablet, Rfl: 0   levothyroxine (SYNTHROID) 75 MCG tablet, Take 75 mcg by mouth daily., Disp: , Rfl:    lisinopril (ZESTRIL) 20 MG tablet, TAKE 1 TABLET(20 MG) BY MOUTH DAILY, Disp: 90 tablet, Rfl: 0   Multiple Vitamin (MULTI VITAMIN PO), Take 1 tablet by mouth daily., Disp: , Rfl:    naloxone (NARCAN) nasal spray 4 mg/0.1 mL, To reverse excess sedation from narcotics, Disp: 2 kit, Rfl: 1   omeprazole (PRILOSEC) 40 MG capsule, Take 1 capsule (40 mg total) by mouth daily., Disp: 30 capsule, Rfl: 0   ondansetron (ZOFRAN-ODT) 8 MG disintegrating tablet, Take 1 tablet (8 mg total) by mouth every 8 (eight)  hours as needed for nausea or vomiting., Disp: 20 tablet, Rfl: 0   oxyCODONE-acetaminophen (PERCOCET) 10-325 MG tablet, Take 1 tablet by mouth every 4 (four) hours as needed for pain., Disp: 135 tablet, Rfl: 0   [START ON 10/15/2023] oxyCODONE-acetaminophen (PERCOCET) 10-325 MG tablet, Take 1 tablet by mouth every 4 (four) hours as needed for pain., Disp: 135 tablet, Rfl: 0   RYBELSUS 3 MG TABS, Take 1 tablet by mouth daily., Disp: , Rfl:    Sod Picosulfate-Mag Ox-Cit Acd (CLENPIQ) 10-3.5-12 MG-GM -GM/160ML SOLN, Take 1 bottle at 5 PM followed by five 8 oz cups of water and repeat 5 hours before procedure., Disp: 320 mL, Rfl: 0   spironolactone (ALDACTONE) 25 MG tablet, TAKE 1 TABLET(25 MG) BY MOUTH DAILY, Disp: 30 tablet, Rfl: 0  Observations/Objective: Patient is well-developed, well-nourished in no acute distress.  Resting comfortably  at home.  Head is normocephalic, atraumatic.  No labored breathing.  Speech is clear and  coherent with logical content.  Patient is alert and oriented at baseline.    Assessment and Plan: 1. Parasitic infection (Primary)  Needs further evaluation as per her report she has not had any true workup -- tape testing, blood work or stool studies. Continued issue despite treatment with albendazole. Needs in person evaluation for exam and lab evaluation. Resources given to have her seen at one of our in person urgent cares if her PCP is unavailable.   Follow Up Instructions: I discussed the assessment and treatment plan with the patient. The patient was provided an opportunity to ask questions and all were answered. The patient agreed with the plan and demonstrated an understanding of the instructions.  A copy of instructions were sent to the patient via MyChart unless otherwise noted below.   The patient was advised to call back or seek an in-person evaluation if the symptoms worsen or if the condition fails to improve as anticipated.    Krista Climes, PA-C

## 2023-12-06 ENCOUNTER — Ambulatory Visit: Attending: Anesthesiology | Admitting: Anesthesiology

## 2023-12-06 DIAGNOSIS — M5432 Sciatica, left side: Secondary | ICD-10-CM

## 2023-12-06 DIAGNOSIS — M5442 Lumbago with sciatica, left side: Secondary | ICD-10-CM | POA: Diagnosis not present

## 2023-12-06 DIAGNOSIS — M25552 Pain in left hip: Secondary | ICD-10-CM

## 2023-12-06 DIAGNOSIS — G8929 Other chronic pain: Secondary | ICD-10-CM

## 2023-12-06 DIAGNOSIS — F1190 Opioid use, unspecified, uncomplicated: Secondary | ICD-10-CM

## 2023-12-06 DIAGNOSIS — G894 Chronic pain syndrome: Secondary | ICD-10-CM

## 2023-12-06 DIAGNOSIS — M25512 Pain in left shoulder: Secondary | ICD-10-CM

## 2023-12-06 DIAGNOSIS — M5441 Lumbago with sciatica, right side: Secondary | ICD-10-CM | POA: Diagnosis not present

## 2023-12-06 DIAGNOSIS — M25549 Pain in joints of unspecified hand: Secondary | ICD-10-CM

## 2023-12-06 DIAGNOSIS — M25511 Pain in right shoulder: Secondary | ICD-10-CM

## 2023-12-06 DIAGNOSIS — Z79891 Long term (current) use of opiate analgesic: Secondary | ICD-10-CM

## 2023-12-06 DIAGNOSIS — L4050 Arthropathic psoriasis, unspecified: Secondary | ICD-10-CM

## 2023-12-06 MED ORDER — OXYCODONE-ACETAMINOPHEN 10-325 MG PO TABS: 1.0000 | ORAL_TABLET | ORAL | 0 refills | Status: DC | PRN

## 2023-12-06 MED ORDER — OXYCODONE-ACETAMINOPHEN 10-325 MG PO TABS: 1.0000 | ORAL_TABLET | ORAL | 0 refills | Status: AC | PRN

## 2023-12-06 NOTE — Progress Notes (Signed)
 Virtual Visit via Telephone Note  I connected with [name] on 12/06/23 at 11:00 AM EDT by telephone and verified that I am speaking with the correct person using two identifiers.  Location: Patient: Home Provider: [facility]   I discussed the limitations, risks, security and privacy concerns of performing an evaluation and management service by telephone and the availability of in person appointments. I also discussed with the patient that there may be a patient responsible charge related to this service. The patient expressed understanding and agreed to proceed.   History of Present Illness: I spoke with Krista Kennedy via telephone as we were unable link for the video portion of the conference.  She reports that she has had quite a bit more left lower extremity discomfort over the past 4 weeks.  She had an L3-4 epidural back in November and prior to that and L5-S1 epidural back in October.  She feels that the most recent November epidural was very effective and gave her 80% relief of her sciatica symptoms for about 2-1/2 months.  She has had a gradual return of the sciatica symptoms over the past 3 to 4 weeks to the point where she is having difficulty sleeping and having more lower extremity pain.  She recently started gabapentin 300 mg at nighttime and that helps at night if she still having a lot of breakthrough pain during the day especially with work.  She is very active and this aggravates the pain complaint.  She denies any weakness in her legs no problems with bowel or bladder function.  She does have diffuse polyarthritis secondary to rheumatoid arthritis and takes oxycodone approximately 4 to 5 tablets/day to assist with generalized pain relief as she has failed more conservative therapy.  This generally works well for her but she does desire to get in for repeat epidural injection.  Otherwise she is in her usual state of health.  Despite physical therapy exercises she cannot  gain any significant improvement.  Heat and ice have not helped.  Review of systems: General: No fevers or chills Pulmonary: No shortness of breath or dyspnea Cardiac: No angina or palpitations or lightheadedness GI: No abdominal pain or constipation Psych: No depression    Observations/Objective:  Current Outpatient Medications:    [START ON 12/13/2023] oxyCODONE-acetaminophen (PERCOCET) 10-325 MG tablet, Take 1 tablet by mouth every 4 (four) hours as needed for pain., Disp: 135 tablet, Rfl: 0   [START ON 01/12/2024] oxyCODONE-acetaminophen (PERCOCET) 10-325 MG tablet, Take 1 tablet by mouth every 4 (four) hours as needed for pain., Disp: 135 tablet, Rfl: 0   albuterol (VENTOLIN HFA) 108 (90 Base) MCG/ACT inhaler, Inhale 2 puffs into the lungs every 6 (six) hours as needed for wheezing or shortness of breath., Disp: 1 each, Rfl: 0   Ascorbic Acid (VITAMIN C) 1000 MG tablet, Take 1,000 mg by mouth daily., Disp: , Rfl:    carvedilol (COREG) 25 MG tablet, Take 1 tablet (25 mg total) by mouth 2 (two) times daily. NO FURTHER REFILLS UNTIL SEEN IN CLINIC. MUST KEEP SCHEDULED APPOINTMENT., Disp: 180 tablet, Rfl: 0   Cholecalciferol 25 MCG (1000 UT) tablet, Take by mouth daily. , Disp: , Rfl:    clobetasol (TEMOVATE) 0.05 % external solution, Apply 1 application topically as needed. , Disp: , Rfl:    estradiol (CLIMARA - DOSED IN MG/24 HR) 0.025 mg/24hr patch, Place 1 patch (0.025 mg total) onto the skin once a week., Disp: 4 patch, Rfl: 0   fluticasone (  FLONASE) 50 MCG/ACT nasal spray, Place 2 sprays into both nostrils daily., Disp: 16 g, Rfl: 3   ibuprofen (ADVIL) 600 MG tablet, Take 1 tablet (600 mg total) by mouth every 6 (six) hours as needed., Disp: 30 tablet, Rfl: 0   Ixekizumab 80 MG/ML SOSY, Inject 80 mg into the skin as directed. [facility], Disp: , Rfl:    levothyroxine (SYNTHROID) 100 MCG tablet, Take 1 tablet (100 mcg total) by mouth daily before breakfast., Disp: 90 tablet, Rfl: 0    levothyroxine (SYNTHROID) 75 MCG tablet, Take 75 mcg by mouth daily., Disp: , Rfl:    lisinopril (ZESTRIL) 20 MG tablet, TAKE 1 TABLET(20 MG) BY MOUTH DAILY, Disp: 90 tablet, Rfl: 0   Multiple Vitamin (MULTI VITAMIN PO), Take 1 tablet by mouth daily., Disp: , Rfl:    naloxone (NARCAN) nasal spray 4 mg/0.1 mL, To reverse excess sedation from narcotics, Disp: 2 kit, Rfl: 1   omeprazole (PRILOSEC) 40 MG capsule, Take 1 capsule (40 mg total) by mouth daily., Disp: 30 capsule, Rfl: 0   ondansetron (ZOFRAN-ODT) 8 MG disintegrating tablet, Take 1 tablet (8 mg total) by mouth every 8 (eight) hours as needed for nausea or vomiting., Disp: 20 tablet, Rfl: 0   RYBELSUS 3 MG TABS, Take 1 tablet by mouth daily., Disp: , Rfl:    Sod Picosulfate-Mag Ox-Cit Acd (CLENPIQ) 10-3.5-12 MG-GM -GM/160ML SOLN, Take 1 bottle at 5 PM followed by five 8 oz cups of water and repeat 5 hours before procedure., Disp: 320 mL, Rfl: 0   spironolactone (ALDACTONE) 25 MG tablet, TAKE 1 TABLET(25 MG) BY MOUTH DAILY, Disp: 30 tablet, Rfl: 0   Past Medical History:  Diagnosis Date   Arthritis    Psoriatic   Cardiomyopathy (HCC)    CHF (congestive heart failure) (HCC)    DDD (degenerative disc disease), lumbar 08/02/2021   Hyperlipidemia    Hypertension    Psoriasis    Sciatica of left side 08/02/2021   Thyroid disease    Transient weakness of left leg 08/13/2019   Wears contact lenses    Assessment and Plan:  1. Chronic bilateral low back pain with bilateral sciatica   2. Sciatica of left side   3. Chronic left hip pain   4. Chronic, continuous use of opioids   5. Psoriatic arthritis (HCC)   6. Chronic pain of both shoulders   7. Chronic pain syndrome   8. Pain in multiple finger joints    Based on conversation it is appropriate to schedule her for repeat epidural as she did very well with the most recent epidural and has failed more conservative therapy with medication management and physical therapy modalities.  Will  schedule this for approximately 5 weeks which is consistent with an opportunity with her scheduling.  In the meantime continue with current opioid medications with prescriptions for the next 2 months both [facility] and [facility].  I have reviewed the [facility] practitioner database information is appropriate.  Continue gabapentin 300 mg at bedtime.  She may ultimately increase this to an additional tablet during the morning or afternoon to help with pain relief.  I have given her the option to see neurosurgery for a surgical impression of her condition but she refuses this at this point.  In the meantime, continue follow-up with her primary care physician for baseline medical care. Follow Up Instructions:    I discussed the assessment and treatment plan with the patient. The patient was provided an opportunity to ask  questions and all were answered. The patient agreed with the plan and demonstrated an understanding of the instructions.   The patient was advised to call back or seek an in-person evaluation if the symptoms worsen or if the condition fails to improve as anticipated.  I provided 30 minutes of non-face-to-face time during this encounter.   Yevette Edwards, MD

## 2023-12-06 NOTE — Patient Instructions (Signed)

## 2024-01-23 ENCOUNTER — Ambulatory Visit: Attending: Anesthesiology | Admitting: Anesthesiology

## 2024-01-23 ENCOUNTER — Encounter: Payer: Self-pay | Admitting: Anesthesiology

## 2024-01-23 DIAGNOSIS — F1190 Opioid use, unspecified, uncomplicated: Secondary | ICD-10-CM

## 2024-01-23 DIAGNOSIS — M5441 Lumbago with sciatica, right side: Secondary | ICD-10-CM

## 2024-01-23 DIAGNOSIS — Z79891 Long term (current) use of opiate analgesic: Secondary | ICD-10-CM

## 2024-01-23 DIAGNOSIS — L4050 Arthropathic psoriasis, unspecified: Secondary | ICD-10-CM

## 2024-01-23 DIAGNOSIS — M5442 Lumbago with sciatica, left side: Secondary | ICD-10-CM | POA: Diagnosis not present

## 2024-01-23 DIAGNOSIS — M25512 Pain in left shoulder: Secondary | ICD-10-CM

## 2024-01-23 DIAGNOSIS — G894 Chronic pain syndrome: Secondary | ICD-10-CM

## 2024-01-23 DIAGNOSIS — M25552 Pain in left hip: Secondary | ICD-10-CM | POA: Diagnosis not present

## 2024-01-23 DIAGNOSIS — M5432 Sciatica, left side: Secondary | ICD-10-CM

## 2024-01-23 DIAGNOSIS — M25549 Pain in joints of unspecified hand: Secondary | ICD-10-CM

## 2024-01-23 DIAGNOSIS — G8929 Other chronic pain: Secondary | ICD-10-CM

## 2024-01-23 DIAGNOSIS — M25511 Pain in right shoulder: Secondary | ICD-10-CM

## 2024-01-23 MED ORDER — OXYCODONE-ACETAMINOPHEN 10-325 MG PO TABS: 1.0000 | ORAL_TABLET | ORAL | 0 refills | Status: AC | PRN

## 2024-01-23 MED ORDER — OXYCODONE-ACETAMINOPHEN 10-325 MG PO TABS: 1.0000 | ORAL_TABLET | ORAL | 0 refills | Status: DC | PRN

## 2024-01-23 NOTE — Progress Notes (Signed)
 Virtual Visit via Telephone Note  I connected with Krista Kennedy on 01/23/24 at  1:45 PM EDT by telephone and verified that I am speaking with the correct person using two identifiers.  Location: Patient: Home Provider: Pain control center   I discussed the limitations, risks, security and privacy concerns of performing an evaluation and management service by telephone and the availability of in person appointments. I also discussed with the patient that there may be a patient responsible charge related to this service. The patient expressed understanding and agreed to proceed.   History of Present Illness: I spoke with Krista Kennedy via telephone as we are unable link for the video portion of the conference.  She reports that her low back pain has been somewhat worse over the past month to 6 weeks.  She is also getting more sciatica symptoms.  Despite medication therapy this pain has been intense to the point where she has been less active.  She is taking her medications as prescribed and these continue to give her about 50 to 75% relief however she feels its time for an epidural steroid injection.  In the past, she has done well with these generally getting 80% reduction in her low back pain and nearly complete relief of her sciatica for about 2 months to 3 months.  Her last injection at L3-L4 back in November has lasted approximately 3 to 4 months.  Despite conservative care and physical therapy modalities the pain persists.  The quality characteristic and distribution is the same as in the past.  No new changes in lower extremity strength function bowel or bladder function are noted.  No effects with her medication are noted.  Review of systems: General: No fevers or chills Pulmonary: No shortness of breath or dyspnea Cardiac: No angina or palpitations or lightheadedness GI: No abdominal pain or constipation Psych: No depression    Observations/Objective:  Current Outpatient Medications:     [START ON 03/12/2024] oxyCODONE -acetaminophen  (PERCOCET) 10-325 MG tablet, Take 1 tablet by mouth every 4 (four) hours as needed for pain., Disp: 135 tablet, Rfl: 0   albuterol  (VENTOLIN  HFA) 108 (90 Base) MCG/ACT inhaler, Inhale 2 puffs into the lungs every 6 (six) hours as needed for wheezing or shortness of breath., Disp: 1 each, Rfl: 0   Ascorbic Acid (VITAMIN C) 1000 MG tablet, Take 1,000 mg by mouth daily., Disp: , Rfl:    carvedilol  (COREG ) 25 MG tablet, Take 1 tablet (25 mg total) by mouth 2 (two) times daily. NO FURTHER REFILLS UNTIL SEEN IN CLINIC. MUST KEEP SCHEDULED APPOINTMENT., Disp: 180 tablet, Rfl: 0   Cholecalciferol 25 MCG (1000 UT) tablet, Take by mouth daily. , Disp: , Rfl:    clobetasol (TEMOVATE) 0.05 % external solution, Apply 1 application topically as needed. , Disp: , Rfl:    estradiol  (CLIMARA  - DOSED IN MG/24 HR) 0.025 mg/24hr patch, Place 1 patch (0.025 mg total) onto the skin once a week., Disp: 4 patch, Rfl: 0   fluticasone  (FLONASE ) 50 MCG/ACT nasal spray, Place 2 sprays into both nostrils daily., Disp: 16 g, Rfl: 3   ibuprofen  (ADVIL ) 600 MG tablet, Take 1 tablet (600 mg total) by mouth every 6 (six) hours as needed., Disp: 30 tablet, Rfl: 0   Ixekizumab 80 MG/ML SOSY, Inject 80 mg into the skin as directed. [facility], Disp: , Rfl:    levothyroxine  (SYNTHROID ) 100 MCG tablet, Take 1 tablet (100 mcg total) by mouth daily before breakfast., Disp: 90 tablet, Rfl: 0  levothyroxine  (SYNTHROID ) 75 MCG tablet, Take 75 mcg by mouth daily., Disp: , Rfl:    lisinopril  (ZESTRIL ) 20 MG tablet, TAKE 1 TABLET(20 MG) BY MOUTH DAILY, Disp: 90 tablet, Rfl: 0   Multiple Vitamin (MULTI VITAMIN PO), Take 1 tablet by mouth daily., Disp: , Rfl:    naloxone  (NARCAN ) nasal spray 4 mg/0.1 mL, To reverse excess sedation from narcotics, Disp: 2 kit, Rfl: 1   omeprazole  (PRILOSEC) 40 MG capsule, Take 1 capsule (40 mg total) by mouth daily., Disp: 30 capsule, Rfl: 0   ondansetron   (ZOFRAN -ODT) 8 MG disintegrating tablet, Take 1 tablet (8 mg total) by mouth every 8 (eight) hours as needed for nausea or vomiting., Disp: 20 tablet, Rfl: 0   [START ON 02/11/2024] oxyCODONE -acetaminophen  (PERCOCET) 10-325 MG tablet, Take 1 tablet by mouth every 4 (four) hours as needed for pain., Disp: 135 tablet, Rfl: 0   RYBELSUS 3 MG TABS, Take 1 tablet by mouth daily., Disp: , Rfl:    Sod Picosulfate-Mag Ox-Cit Acd (CLENPIQ ) 10-3.5-12 MG-GM -GM/160ML SOLN, Take 1 bottle at 5 PM followed by five 8 oz cups of water  and repeat 5 hours before procedure., Disp: 320 mL, Rfl: 0   spironolactone  (ALDACTONE ) 25 MG tablet, TAKE 1 TABLET(25 MG) BY MOUTH DAILY, Disp: 30 tablet, Rfl: 0   Past Medical History:  Diagnosis Date   Arthritis    Psoriatic   Cardiomyopathy (HCC)    CHF (congestive heart failure) (HCC)    DDD (degenerative disc disease), lumbar 08/02/2021   Hyperlipidemia    Hypertension    Psoriasis    Sciatica of left side 08/02/2021   Thyroid disease    Transient weakness of left leg 08/13/2019   Wears contact lenses    Assessment and Plan:  1. Chronic bilateral low back pain with bilateral sciatica   2. Sciatica of left side   3. Chronic left hip pain   4. Chronic, continuous use of opioids   5. Psoriatic arthritis (HCC)   6. Chronic pain of both shoulders   7. Chronic pain syndrome   8. Pain in multiple finger joints    Based on our conversation and after review of the [place]  practitioner database information I think is appropriate to refill her medications.  This will be for June 8 and July 8.  No other changes in her regimen will be initiated.  Additionally, I am going to schedule her for an epidural steroid here within the next 2 to 3 weeks.  I want her to continue with conservative care ice heat application and stretching.  Continue follow-up with her primary care physician for baseline medical care with return to clinic in 3 weeks. Follow Up Instructions:    I  discussed the assessment and treatment plan with the patient. The patient was provided an opportunity to ask questions and all were answered. The patient agreed with the plan and demonstrated an understanding of the instructions.   The patient was advised to call back or seek an in-person evaluation if the symptoms worsen or if the condition fails to improve as anticipated.  I provided 30 minutes of non-face-to-face time during this encounter.   Zula Hitch, MD

## 2024-02-13 ENCOUNTER — Ambulatory Visit: Admitting: Anesthesiology

## 2024-02-14 ENCOUNTER — Ambulatory Visit (HOSPITAL_BASED_OUTPATIENT_CLINIC_OR_DEPARTMENT_OTHER): Admitting: Anesthesiology

## 2024-02-14 ENCOUNTER — Encounter: Payer: Self-pay | Admitting: Anesthesiology

## 2024-02-14 ENCOUNTER — Ambulatory Visit
Admission: RE | Admit: 2024-02-14 | Discharge: 2024-02-14 | Disposition: A | Discharge status: Home / Self Care | Source: Ambulatory Visit | Attending: Anesthesiology | Admitting: Anesthesiology

## 2024-02-14 ENCOUNTER — Other Ambulatory Visit: Payer: Self-pay | Admitting: Anesthesiology

## 2024-02-14 VITALS — BP 121/62 | HR 82 | Temp 97.6°F | Resp 16 | Ht 70.0 in | Wt 160.0 lb

## 2024-02-14 DIAGNOSIS — M25532 Pain in left wrist: Secondary | ICD-10-CM

## 2024-02-14 DIAGNOSIS — M25549 Pain in joints of unspecified hand: Secondary | ICD-10-CM

## 2024-02-14 DIAGNOSIS — G894 Chronic pain syndrome: Secondary | ICD-10-CM

## 2024-02-14 DIAGNOSIS — G8929 Other chronic pain: Secondary | ICD-10-CM

## 2024-02-14 DIAGNOSIS — L4050 Arthropathic psoriasis, unspecified: Secondary | ICD-10-CM

## 2024-02-14 DIAGNOSIS — F1190 Opioid use, unspecified, uncomplicated: Secondary | ICD-10-CM

## 2024-02-14 DIAGNOSIS — M5432 Sciatica, left side: Secondary | ICD-10-CM

## 2024-02-14 MED ORDER — SODIUM CHLORIDE 0.9% FLUSH
10.0000 mL | Freq: Once | INTRAVENOUS | Status: AC
  Administered 2024-02-14: 10 mL

## 2024-02-14 MED ORDER — TRIAMCINOLONE ACETONIDE 40 MG/ML IJ SUSP
40.0000 mg | Freq: Once | INTRAMUSCULAR | Status: AC
  Administered 2024-02-14: 40 mg

## 2024-02-14 MED ORDER — LIDOCAINE HCL (PF) 1 % IJ SOLN
5.0000 mL | Freq: Once | INTRAMUSCULAR | Status: AC
  Administered 2024-02-14: 5 mL via SUBCUTANEOUS

## 2024-02-14 MED ORDER — IOHEXOL 180 MG/ML  SOLN
INTRAMUSCULAR | Status: AC
Start: 2024-02-14 — End: 2024-02-14
  Filled 2024-02-14: qty 20

## 2024-02-14 MED ORDER — LIDOCAINE HCL (PF) 1 % IJ SOLN
INTRAMUSCULAR | Status: AC
Start: 2024-02-14 — End: 2024-02-14
  Filled 2024-02-14: qty 5

## 2024-02-14 MED ORDER — SODIUM CHLORIDE (PF) 0.9 % IJ SOLN
INTRAMUSCULAR | Status: AC
Start: 2024-02-14 — End: 2024-02-14
  Filled 2024-02-14: qty 10

## 2024-02-14 MED ORDER — TRIAMCINOLONE ACETONIDE 40 MG/ML IJ SUSP
INTRAMUSCULAR | Status: AC
  Filled 2024-02-14: qty 1

## 2024-02-14 MED ORDER — ROPIVACAINE HCL 2 MG/ML IJ SOLN
INTRAMUSCULAR | Status: AC
Start: 2024-02-14 — End: 2024-02-14
  Filled 2024-02-14: qty 20

## 2024-02-14 MED ORDER — OXYCODONE-ACETAMINOPHEN 10-325 MG PO TABS: 1.0000 | ORAL_TABLET | ORAL | 0 refills | Status: DC | PRN

## 2024-02-14 MED ORDER — IOHEXOL 180 MG/ML  SOLN
10.0000 mL | Freq: Once | INTRAMUSCULAR | Status: AC | PRN
  Administered 2024-02-14: 10 mL via EPIDURAL

## 2024-02-14 MED ORDER — ROPIVACAINE HCL 2 MG/ML IJ SOLN
10.0000 mL | Freq: Once | INTRAMUSCULAR | Status: AC
  Administered 2024-02-14: 10 mL via EPIDURAL

## 2024-02-14 NOTE — Progress Notes (Signed)
 Subjective:  Patient ID: Krista Kennedy, female    DOB: 1966/03/31  Age: 58 y.o. MRN: [id number]  CC: Back Pain (lower)   Procedure: Lumbar epidural steroid under fluoroscopic guidance with no sedation  HPI Krista Kennedy presents for reevaluation.  Krista Kennedy presents today with complaints of left lower extremity sciatica symptoms comparable to what she has had in the past.  In the past she has responded to epidural steroid injections and most recently she had a November L3 4 injection which was very effective for her.  This is different than the L5-S1 and more beneficial.  She went almost 5 months with 80% regression of the sciatica symptoms but these have flared over the course of the last few weeks to the point where she is seeking a repeat injection.  She has tried her medication management and stretching exercises with limited success.  In the circumstances the only effective therapy has been epidural steroid injections for her and they generally work very well.  The quality characteristic and distribution of the pain that she does experience is stable in nature with no changes in lower extremity strength function bowel or bladder function.  She take her medications as prescribed and these continue to give her good generalized pain relief for her rheumatoid arthritis additionally as well as her back pain.  Outpatient Medications Prior to Visit  Medication Sig Dispense Refill   albuterol  (VENTOLIN  HFA) 108 (90 Base) MCG/ACT inhaler Inhale 2 puffs into the lungs every 6 (six) hours as needed for wheezing or shortness of breath. 1 each 0   Ascorbic Acid (VITAMIN C) 1000 MG tablet Take 1,000 mg by mouth daily.     carvedilol  (COREG ) 25 MG tablet Take 1 tablet (25 mg total) by mouth 2 (two) times daily. NO FURTHER REFILLS UNTIL SEEN IN CLINIC. MUST KEEP SCHEDULED APPOINTMENT. 180 tablet 0   Cholecalciferol 25 MCG (1000 UT) tablet Take by mouth daily.      clobetasol (TEMOVATE) 0.05 % external solution  Apply 1 application topically as needed.      estradiol  (CLIMARA  - DOSED IN MG/24 HR) 0.025 mg/24hr patch Place 1 patch (0.025 mg total) onto the skin once a week. 4 patch 0   fluticasone  (FLONASE ) 50 MCG/ACT nasal spray Place 2 sprays into both nostrils daily. 16 g 3   ibuprofen  (ADVIL ) 600 MG tablet Take 1 tablet (600 mg total) by mouth every 6 (six) hours as needed. 30 tablet 0   Ixekizumab 80 MG/ML SOSY Inject 80 mg into the skin as directed. [facility]     levothyroxine  (SYNTHROID ) 100 MCG tablet Take 1 tablet (100 mcg total) by mouth daily before breakfast. 90 tablet 0   levothyroxine  (SYNTHROID ) 75 MCG tablet Take 75 mcg by mouth daily.     lisinopril  (ZESTRIL ) 20 MG tablet TAKE 1 TABLET(20 MG) BY MOUTH DAILY 90 tablet 0   Multiple Vitamin (MULTI VITAMIN PO) Take 1 tablet by mouth daily.     naloxone  (NARCAN ) nasal spray 4 mg/0.1 mL To reverse excess sedation from narcotics 2 kit 1   omeprazole  (PRILOSEC) 40 MG capsule Take 1 capsule (40 mg total) by mouth daily. 30 capsule 0   ondansetron  (ZOFRAN -ODT) 8 MG disintegrating tablet Take 1 tablet (8 mg total) by mouth every 8 (eight) hours as needed for nausea or vomiting. 20 tablet 0   [START ON 03/12/2024] oxyCODONE -acetaminophen  (PERCOCET) 10-325 MG tablet Take 1 tablet by mouth every 4 (four) hours as needed for pain. 135 tablet 0  Sod Picosulfate-Mag Ox-Cit Acd (CLENPIQ ) 10-3.5-12 MG-GM -GM/160ML SOLN Take 1 bottle at 5 PM followed by five 8 oz cups of water  and repeat 5 hours before procedure. 320 mL 0   spironolactone  (ALDACTONE ) 25 MG tablet TAKE 1 TABLET(25 MG) BY MOUTH DAILY 30 tablet 0   oxyCODONE -acetaminophen  (PERCOCET) 10-325 MG tablet Take 1 tablet by mouth every 4 (four) hours as needed for pain. 135 tablet 0   RYBELSUS 3 MG TABS Take 1 tablet by mouth daily. (Patient not taking: Reported on 02/14/2024)     No facility-administered medications prior to visit.    Review of Systems CNS: No confusion or sedation Cardiac: No  angina or palpitations GI: No abdominal pain or constipation Constitutional: No nausea vomiting fevers or chills  Objective:  BP 121/62   Pulse 82   Temp 97.6 F (36.4 C) (Temporal)   Resp 16   Ht 5' 10 (1.778 m)   Wt 160 lb (72.6 kg)   SpO2 100%   BMI 22.96 kg/m    BP Readings from Last 3 Encounters:  02/14/24 121/62  07/31/23 134/88  06/06/23 (!) 143/76     Wt Readings from Last 3 Encounters:  02/14/24 160 lb (72.6 kg)  07/31/23 162 lb (73.5 kg)  06/06/23 160 lb (72.6 kg)     Physical Exam Pt is alert and oriented PERRL EOMI HEART IS RRR no murmur or rub LCTA no wheezing or rales MUSCULOSKELETAL reveals no paraspinous muscle tenderness or overt trigger points.  She walks with an antalgic gait with a positive straight leg raise on the left side.  Negative on the right.  Labs  Lab Results  Component Value Date   HGBA1C 6.4 (H) 05/06/2020   HGBA1C 6.7 (H) 07/20/2018   HGBA1C 5.9 (H) 04/11/2017   Lab Results  Component Value Date   LDLCALC 118 (H) 05/06/2020   CREATININE 1.29 (H) 09/04/2020    -------------------------------------------------------------------------------------------------------------------- Lab Results  Component Value Date   WBC 7.2 09/04/2020   HGB 13.4 09/04/2020   HCT 40.6 09/04/2020   PLT 270 09/04/2020   GLUCOSE 117 (H) 09/04/2020   CHOL 200 (H) 05/06/2020   TRIG 122 05/06/2020   HDL 60 05/06/2020   LDLCALC 118 (H) 05/06/2020   ALT 16 05/06/2020   AST 24 05/06/2020   NA 136 09/04/2020   K 3.9 09/04/2020   CL 97 (L) 09/04/2020   CREATININE 1.29 (H) 09/04/2020   BUN 24 (H) 09/04/2020   CO2 28 09/04/2020   TSH 0.130 (L) 05/06/2020   HGBA1C 6.4 (H) 05/06/2020    --------------------------------------------------------------------------------------------------------------------- DG PAIN CLINIC C-ARM 1-60 MIN NO REPORT Result Date: 02/14/2024 Fluoro was used, but no Radiologist interpretation will be provided. Please refer  to NOTES tab for provider progress note.    Assessment & Plan:   Krista Kennedy was seen today for back pain.  Diagnoses and all orders for this visit:  Sciatica of left side  Chronic left hip pain  Chronic, continuous use of opioids  Chronic pain syndrome  Pain in multiple finger joints  Left wrist pain  Psoriasis with arthropathy (HCC)  Other orders -     oxyCODONE -acetaminophen  (PERCOCET) 10-325 MG tablet; Take 1 tablet by mouth every 4 (four) hours as needed for pain. -     triamcinolone  acetonide (KENALOG -40) injection 40 mg -     ropivacaine  (PF) 2 mg/mL (0.2%) (NAROPIN ) injection 10 mL -     sodium chloride  flush (NS) 0.9 % injection 10 mL -  lidocaine  (PF) (XYLOCAINE ) 1 % injection 5 mL -     iohexol  (OMNIPAQUE ) 180 MG/ML injection 10 mL        ----------------------------------------------------------------------------------------------------------------------  Problem List Items Addressed This Visit       Unprioritized   Chronic hip pain (Chronic)   Relevant Medications   oxyCODONE -acetaminophen  (PERCOCET) 10-325 MG tablet (Start on 04/11/2024)   Chronic pain syndrome (Chronic)   Relevant Medications   oxyCODONE -acetaminophen  (PERCOCET) 10-325 MG tablet (Start on 04/11/2024)   Left wrist pain (Chronic)   Chronic, continuous use of opioids   Pain in multiple finger joints   Psoriasis with arthropathy (HCC)   Relevant Medications   oxyCODONE -acetaminophen  (PERCOCET) 10-325 MG tablet (Start on 04/11/2024)   Sciatica of left side - Primary      ----------------------------------------------------------------------------------------------------------------------  1. Sciatica of left side (Primary) Will proceed with a repeat epidural as she has done very well with these and gets good relief.  I have encouraged her to continue with stretching strengthening exercises as she feels better and resume physical therapy.  Continue her current medication  management.  2. Chronic left hip pain As above  3. Chronic, continuous use of opioids I have reviewed the [place]  practitioner database information is appropriate for refill.  She is done very well with chronic opioid therapy and refills to be generated for July 8 and August 7 with return to clinic scheduled in 1 month for evaluation  4. Chronic pain syndrome As above  5. Pain in multiple finger joints As above and continue follow-up with her rheumatologist  6. Left wrist pain As above  7. Psoriasis with arthropathy (HCC) As above    ----------------------------------------------------------------------------------------------------------------------  I am having Krista Kennedy maintain her vitamin C, Cholecalciferol, ixekizumab, clobetasol, naloxone , fluticasone , Multiple Vitamin (MULTI VITAMIN PO), estradiol , Clenpiq , omeprazole , albuterol , spironolactone , lisinopril , levothyroxine , carvedilol , ibuprofen , levothyroxine , Rybelsus, ondansetron , oxyCODONE -acetaminophen , and oxyCODONE -acetaminophen . We administered triamcinolone  acetonide, ropivacaine  (PF) 2 mg/mL (0.2%), sodium chloride  flush, lidocaine  (PF), and iohexol .   Meds ordered this encounter  Medications   oxyCODONE -acetaminophen  (PERCOCET) 10-325 MG tablet    Sig: Take 1 tablet by mouth every 4 (four) hours as needed for pain.    Dispense:  135 tablet    Refill:  0   triamcinolone  acetonide (KENALOG -40) injection 40 mg   ropivacaine  (PF) 2 mg/mL (0.2%) (NAROPIN ) injection 10 mL   sodium chloride  flush (NS) 0.9 % injection 10 mL   lidocaine  (PF) (XYLOCAINE ) 1 % injection 5 mL   iohexol  (OMNIPAQUE ) 180 MG/ML injection 10 mL   Patient's Medications  New Prescriptions   No medications on file  Previous Medications   ALBUTEROL  (VENTOLIN  HFA) 108 (90 BASE) MCG/ACT INHALER    Inhale 2 puffs into the lungs every 6 (six) hours as needed for wheezing or shortness of breath.   ASCORBIC ACID (VITAMIN C) 1000 MG TABLET     Take 1,000 mg by mouth daily.   CARVEDILOL  (COREG ) 25 MG TABLET    Take 1 tablet (25 mg total) by mouth 2 (two) times daily. NO FURTHER REFILLS UNTIL SEEN IN CLINIC. MUST KEEP SCHEDULED APPOINTMENT.   CHOLECALCIFEROL 25 MCG (1000 UT) TABLET    Take by mouth daily.    CLOBETASOL (TEMOVATE) 0.05 % EXTERNAL SOLUTION    Apply 1 application topically as needed.    ESTRADIOL  (CLIMARA  - DOSED IN MG/24 HR) 0.025 MG/24HR PATCH    Place 1 patch (0.025 mg total) onto the skin once a week.   FLUTICASONE  (FLONASE ) 50 MCG/ACT NASAL SPRAY  Place 2 sprays into both nostrils daily.   IBUPROFEN  (ADVIL ) 600 MG TABLET    Take 1 tablet (600 mg total) by mouth every 6 (six) hours as needed.   IXEKIZUMAB 80 MG/ML SOSY    Inject 80 mg into the skin as directed. [facility]   LEVOTHYROXINE  (SYNTHROID ) 100 MCG TABLET    Take 1 tablet (100 mcg total) by mouth daily before breakfast.   LEVOTHYROXINE  (SYNTHROID ) 75 MCG TABLET    Take 75 mcg by mouth daily.   LISINOPRIL  (ZESTRIL ) 20 MG TABLET    TAKE 1 TABLET(20 MG) BY MOUTH DAILY   MULTIPLE VITAMIN (MULTI VITAMIN PO)    Take 1 tablet by mouth daily.   NALOXONE  (NARCAN ) NASAL SPRAY 4 MG/0.1 ML    To reverse excess sedation from narcotics   OMEPRAZOLE  (PRILOSEC) 40 MG CAPSULE    Take 1 capsule (40 mg total) by mouth daily.   ONDANSETRON  (ZOFRAN -ODT) 8 MG DISINTEGRATING TABLET    Take 1 tablet (8 mg total) by mouth every 8 (eight) hours as needed for nausea or vomiting.   OXYCODONE -ACETAMINOPHEN  (PERCOCET) 10-325 MG TABLET    Take 1 tablet by mouth every 4 (four) hours as needed for pain.   RYBELSUS 3 MG TABS    Take 1 tablet by mouth daily.   SOD PICOSULFATE-MAG OX-CIT ACD (CLENPIQ ) 10-3.5-12 MG-GM -GM/160ML SOLN    Take 1 bottle at 5 PM followed by five 8 oz cups of water  and repeat 5 hours before procedure.   SPIRONOLACTONE  (ALDACTONE ) 25 MG TABLET    TAKE 1 TABLET(25 MG) BY MOUTH DAILY  Modified Medications   Modified Medication Previous Medication    OXYCODONE -ACETAMINOPHEN  (PERCOCET) 10-325 MG TABLET oxyCODONE -acetaminophen  (PERCOCET) 10-325 MG tablet      Take 1 tablet by mouth every 4 (four) hours as needed for pain.    Take 1 tablet by mouth every 4 (four) hours as needed for pain.  Discontinued Medications   No medications on file   ----------------------------------------------------------------------------------------------------------------------  Follow-up: Return in about 1 month (around 03/15/2024) for evaluation, med refill.   Procedure: L3-4 LESI with fluoroscopic guidance and without moderate sedation  NOTE: The risks, benefits, and expectations of the procedure have been discussed and explained to the patient who was understanding and in agreement with suggested treatment plan. No guarantees were made.  DESCRIPTION OF PROCEDURE: Lumbar epidural steroid injection with no IV Versed, EKG, blood pressure, pulse, and pulse oximetry monitoring. The procedure was performed with the patient in the prone position under fluoroscopic guidance.  Sterile prep x3 was initiated and I then injected subcutaneous lidocaine  to the overlying L3-4 site after its fluoroscopic identifictation.  Using strict aseptic technique, I then advanced an 18-gauge Tuohy epidural needle in the midline using interlaminar approach via loss-of-resistance to saline technique. There was negative aspiration for heme or  CSF.  I then confirmed position with both AP and Lateral fluoroscan.  2 cc of contrast dye were injected and a  total of 5 mL of Preservative-Free normal saline mixed with 40 mg of Kenalog  and 1cc Ropicaine 0.2 percent were injected incrementally via the  epidurally placed needle. The needle was removed. The patient tolerated the injection well and was convalesced and discharged to home in stable condition. Should the patient have any post procedure difficulty they have been instructed on how to contact us  for assistance.   Krista Hitch, MD

## 2024-03-08 IMAGING — MR MR LUMBAR SPINE W/O CM
5 series · 31 of 48 positions shown · non-contrast
Comparison: Lumbar radiographs 05/20/2019.

CLINICAL DATA: Low back pain, symptoms persist with > 6 wks
treatment Lumbar radiculopathy, symptoms persist with > 6 wks
treatment

EXAM:
MRI LUMBAR SPINE WITHOUT CONTRAST
TECHNIQUE: Multiplanar, multisequence MR imaging of the lumbar spine was
performed. No intravenous contrast was administered.

[Series 5: T2 · sagittal · 4.0mm · 0.88mm/px · 6 of 17 slices shown (1 of 2)]
[im 1/17]
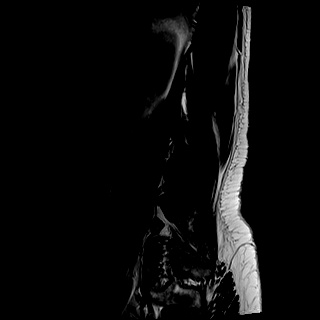
[im 4/17]
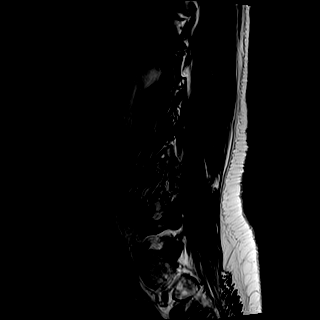
[im 7/17]
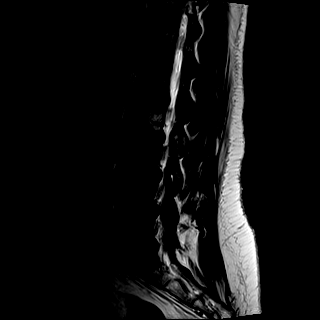
[im 10/17]
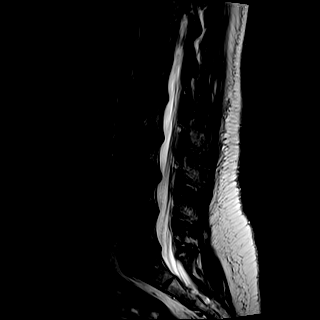
[im 13/17]
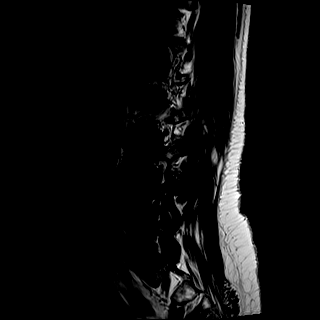
[im 17/17]
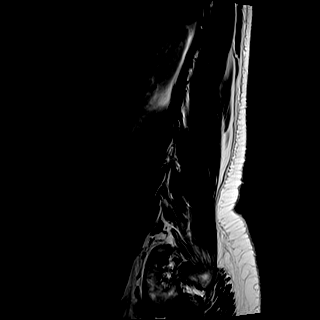

[Series 6: T1 · sagittal · 4.0mm · 0.88mm/px · 7 of 17 slices shown (1 of 2)]
[im 1/17]
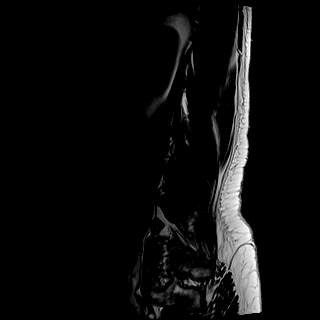
[im 3/17]
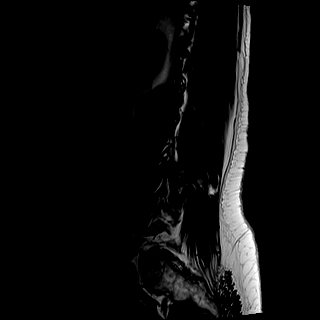
[im 6/17]
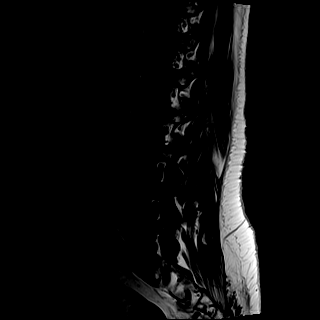
[im 9/17]
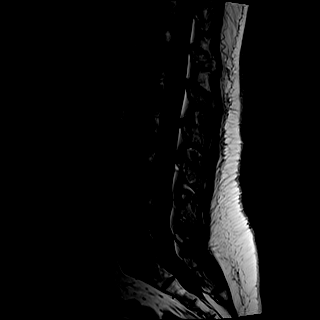
[im 11/17]
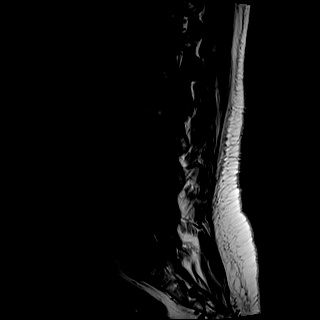
[im 14/17]
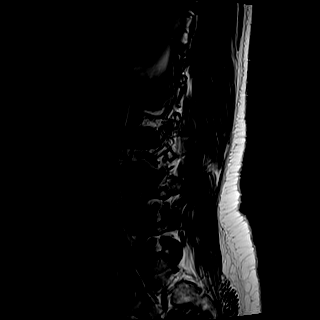
[im 17/17]
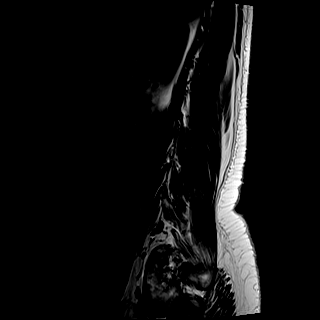

[Series 7: STIR · sagittal · 4.0mm · 0.44mm/px · 2 of 17 slices shown]
[im 1/17]
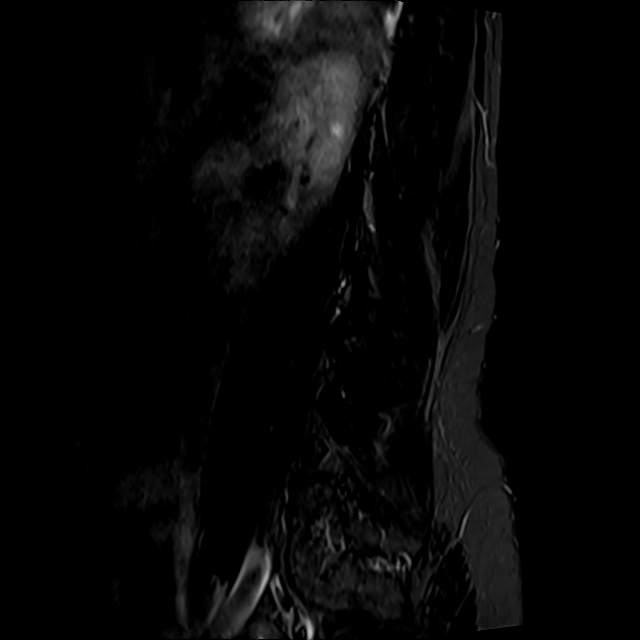
[im 3/17]
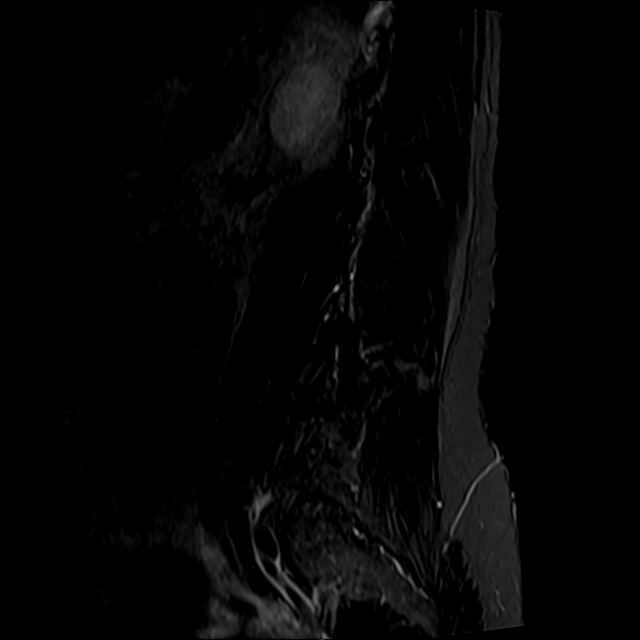

[Series 8: T2 · axial · 4.0mm · 0.78mm/px · z∈[-26,+182]mm · 8 of 36 slices shown (2 of 2)]
[im 1/36]
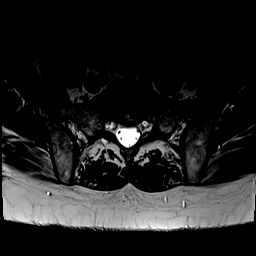
[im 6/36]
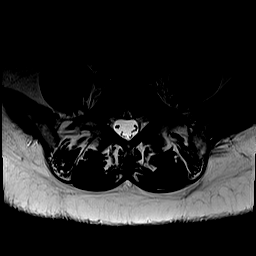
[im 11/36]
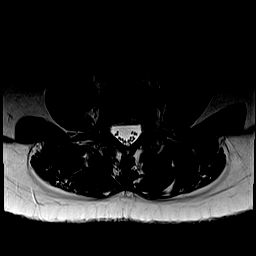
[im 17/36]
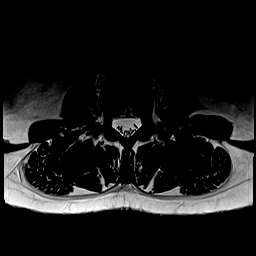
[im 19/36]
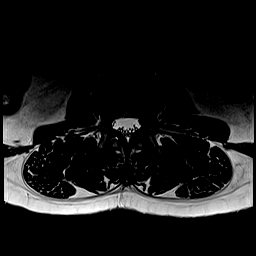
[im 25/36]
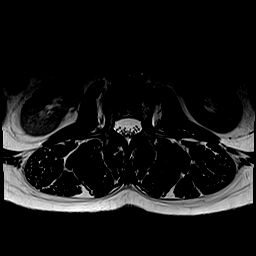
[im 30/36]
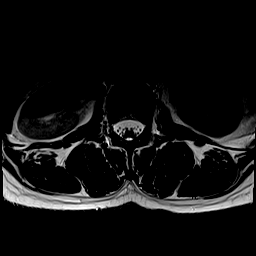
[im 36/36]
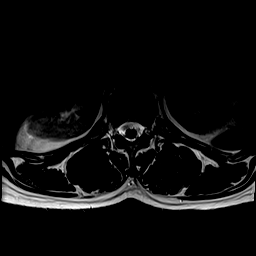

[Series 9: T1 · axial · 4.0mm · 0.39mm/px · z∈[-26,+182]mm · 8 of 36 slices shown (2 of 2)]
[im 1/36]
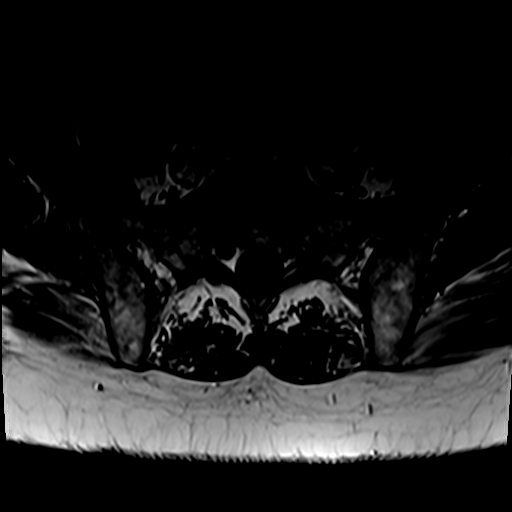
[im 6/36]
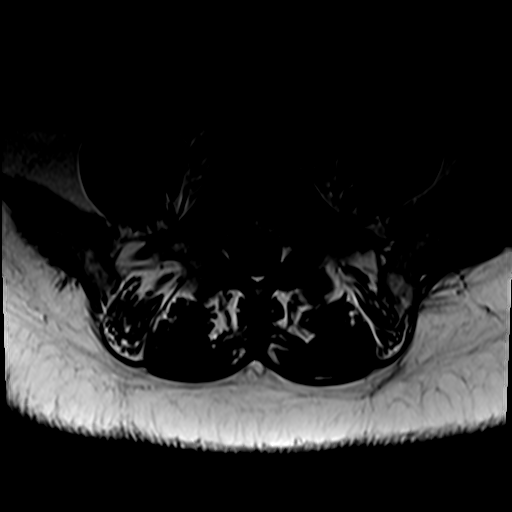
[im 11/36]
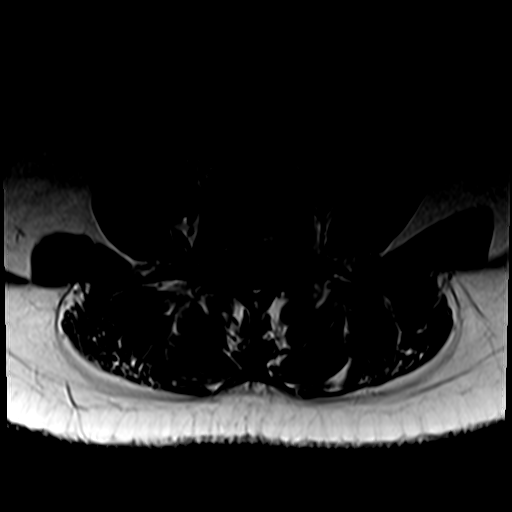
[im 17/36]
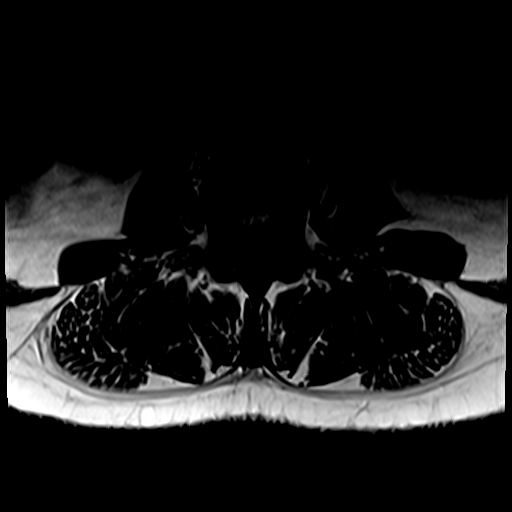
[im 19/36]
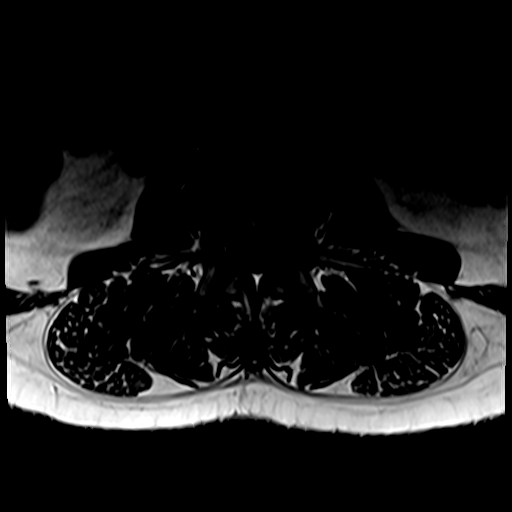
[im 25/36]
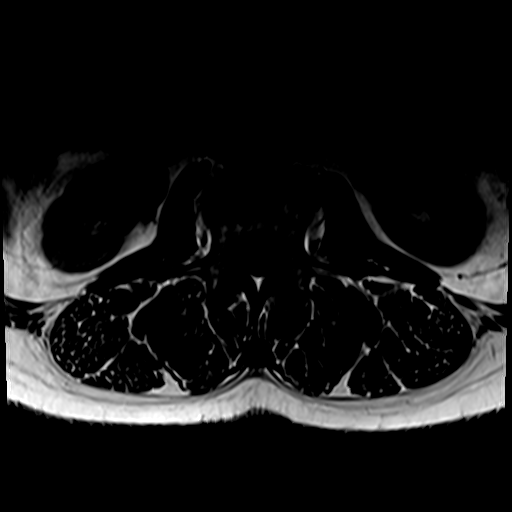
[im 30/36]
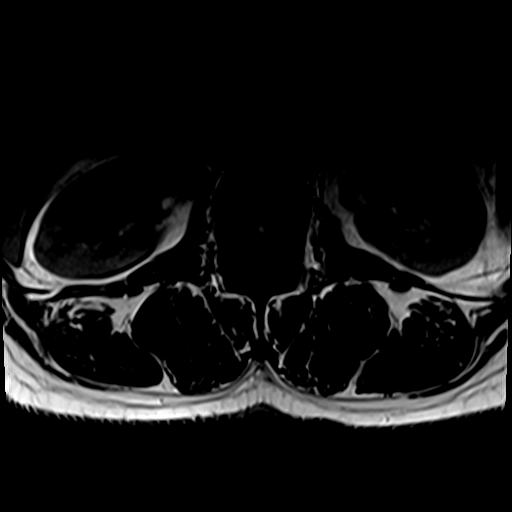
[im 36/36]
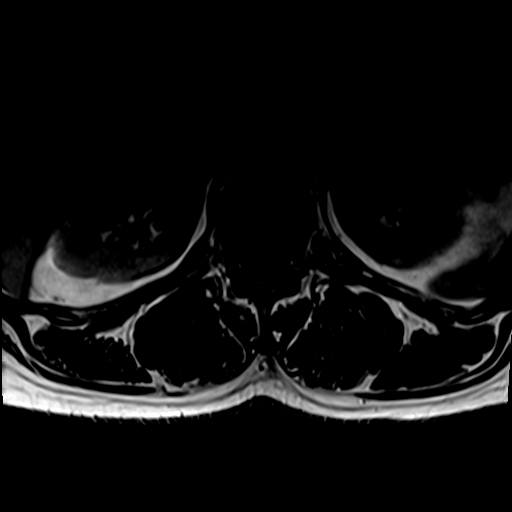

[31 of 48 positions shown; findings below may reference images not displayed]

FINDINGS: Segmentation: Standard segmentation is assumed. The inferior-most
fully formed intervertebral disc labeled L5-S1.

Alignment:  Straightening.  No substantial sagittal subluxation.

Vertebrae: Vertebral body heights are maintained. No focal marrow
edema to suggest acute fracture discitis/osteomyelitis. Mildly
heterogeneous bone marrow without suspicious bone lesion.

Conus medullaris and cauda equina: Conus extends to the superior L2
level. Conus appears normal.

Paraspinal and other soft tissues: Small left renal cyst.

Disc levels:

T12-L1: No significant disc protrusion, foraminal stenosis, or canal
stenosis.

L1-L2: Mild disc bulging without significant stenosis.

L2-L3: Disc height loss and desiccation. Mild broad disc bulging
without significant stenosis.

L3-L4: Disc height loss and desiccation. Broad disc bulging with
bilateral foraminal disc protrusions. Resulting mild bilateral
foraminal stenosis with left far lateral/extraforaminal disc closely
approximating the exiting/exited left L4 nerve. Mild right
subarticular recess stenosis without significant canal stenosis.

L4-L5: Mild disc bulging with bilateral foraminal disc protrusions.
Moderate bilateral foraminal stenosis. No significant canal
stenosis.

L5-S1: Mild disc bulging with bilateral subarticular annular
fissures. Resulting mild bilateral foraminal stenosis. No
significant canal stenosis.
IMPRESSION: 1. At L4-L5, moderate bilateral foraminal stenosis.
2. At L3-L4, mild bilateral foraminal stenosis with left far
lateral/extraforaminal disc closely approximating the exiting/exited
left L4 nerve.
3. At L5-S1, mild bilateral foraminal stenosis.

## 2024-05-07 ENCOUNTER — Encounter: Payer: Self-pay | Admitting: Anesthesiology

## 2024-05-07 ENCOUNTER — Ambulatory Visit: Attending: Anesthesiology | Admitting: Anesthesiology

## 2024-05-07 DIAGNOSIS — G8929 Other chronic pain: Secondary | ICD-10-CM

## 2024-05-07 DIAGNOSIS — L4050 Arthropathic psoriasis, unspecified: Secondary | ICD-10-CM

## 2024-05-07 DIAGNOSIS — M5432 Sciatica, left side: Secondary | ICD-10-CM | POA: Diagnosis not present

## 2024-05-07 DIAGNOSIS — Z79891 Long term (current) use of opiate analgesic: Secondary | ICD-10-CM

## 2024-05-07 DIAGNOSIS — F1190 Opioid use, unspecified, uncomplicated: Secondary | ICD-10-CM

## 2024-05-07 DIAGNOSIS — G894 Chronic pain syndrome: Secondary | ICD-10-CM

## 2024-05-07 DIAGNOSIS — M25532 Pain in left wrist: Secondary | ICD-10-CM

## 2024-05-07 DIAGNOSIS — M25549 Pain in joints of unspecified hand: Secondary | ICD-10-CM | POA: Diagnosis not present

## 2024-05-07 DIAGNOSIS — M25552 Pain in left hip: Secondary | ICD-10-CM | POA: Diagnosis not present

## 2024-05-07 MED ORDER — OXYCODONE-ACETAMINOPHEN 10-325 MG PO TABS: 1.0000 | ORAL_TABLET | ORAL | 0 refills | Status: DC | PRN

## 2024-05-07 MED ORDER — OXYCODONE-ACETAMINOPHEN 10-325 MG PO TABS: 1.0000 | ORAL_TABLET | ORAL | 0 refills | Status: AC | PRN

## 2024-05-07 NOTE — Progress Notes (Signed)
 Virtual Visit via Telephone Note  I connected with Krista Kennedy on 05/07/24 at  8:00 AM EDT by telephone and verified that I am speaking with the correct person using two identifiers.  Location: Patient: Home Provider: [facility]   I discussed the limitations, risks, security and privacy concerns of performing an evaluation and management service by telephone and the availability of in person appointments. I also discussed with the patient that there may be a patient responsible charge related to this service. The patient expressed understanding and agreed to proceed.   History of Present Illness: I spoke with Krista Kennedy via telephone as we are unable link for the video portion of the conference.  She reports that she did quite well following her last epidural back in June.  She had significant improvement in her left lower leg lateral sciatica and this lasted about 1 month but she had recurrence of her low back pain about 2 weeks thereafter.  Unfortunately she has had to use more of her Percocet secondary to the persistence and severity of pain.  She has been using the 10/325 tablets every 4 hours.  She will run short by a few days this month.  The quality of the pain is stable with no recent changes in type of pain with primarily centralized low back pain radiating into the left hip and left lateral leg.  No change in lower extremity strength or function is noted at this time but the pain has been intractable and is restricting her activity and sleep despite efforts at physical therapy ice and heat.  The medications are without side effect.  Review of systems: General: No fevers or chills Pulmonary: No shortness of breath or dyspnea Cardiac: No angina or palpitations or lightheadedness GI: No abdominal pain or constipation Psych: No depression    Observations/Objective:  Current Outpatient Medications:    albuterol  (VENTOLIN  HFA) 108 (90 Base) MCG/ACT inhaler, Inhale 2 puffs  into the lungs every 6 (six) hours as needed for wheezing or shortness of breath., Disp: 1 each, Rfl: 0   Ascorbic Acid (VITAMIN C) 1000 MG tablet, Take 1,000 mg by mouth daily., Disp: , Rfl:    carvedilol  (COREG ) 25 MG tablet, Take 1 tablet (25 mg total) by mouth 2 (two) times daily. NO FURTHER REFILLS UNTIL SEEN IN CLINIC. MUST KEEP SCHEDULED APPOINTMENT., Disp: 180 tablet, Rfl: 0   Cholecalciferol 25 MCG (1000 UT) tablet, Take by mouth daily. , Disp: , Rfl:    clobetasol (TEMOVATE) 0.05 % external solution, Apply 1 application topically as needed. , Disp: , Rfl:    estradiol  (CLIMARA  - DOSED IN MG/24 HR) 0.025 mg/24hr patch, Place 1 patch (0.025 mg total) onto the skin once a week., Disp: 4 patch, Rfl: 0   fluticasone  (FLONASE ) 50 MCG/ACT nasal spray, Place 2 sprays into both nostrils daily., Disp: 16 g, Rfl: 3   ibuprofen  (ADVIL ) 600 MG tablet, Take 1 tablet (600 mg total) by mouth every 6 (six) hours as needed., Disp: 30 tablet, Rfl: 0   Ixekizumab 80 MG/ML SOSY, Inject 80 mg into the skin as directed. [facility], Disp: , Rfl:    levothyroxine  (SYNTHROID ) 100 MCG tablet, Take 1 tablet (100 mcg total) by mouth daily before breakfast., Disp: 90 tablet, Rfl: 0   levothyroxine  (SYNTHROID ) 75 MCG tablet, Take 75 mcg by mouth daily., Disp: , Rfl:    lisinopril  (ZESTRIL ) 20 MG tablet, TAKE 1 TABLET(20 MG) BY MOUTH DAILY, Disp: 90 tablet, Rfl: 0  Multiple Vitamin (MULTI VITAMIN PO), Take 1 tablet by mouth daily., Disp: , Rfl:    naloxone  (NARCAN ) nasal spray 4 mg/0.1 mL, To reverse excess sedation from narcotics, Disp: 2 kit, Rfl: 1   omeprazole  (PRILOSEC) 40 MG capsule, Take 1 capsule (40 mg total) by mouth daily., Disp: 30 capsule, Rfl: 0   ondansetron  (ZOFRAN -ODT) 8 MG disintegrating tablet, Take 1 tablet (8 mg total) by mouth every 8 (eight) hours as needed for nausea or vomiting., Disp: 20 tablet, Rfl: 0   [START ON 05/08/2024] oxyCODONE -acetaminophen  (PERCOCET) 10-325 MG tablet, Take 1 tablet by  mouth every 4 (four) hours as needed for pain., Disp: 135 tablet, Rfl: 0   [START ON 06/06/2024] oxyCODONE -acetaminophen  (PERCOCET) 10-325 MG tablet, Take 1 tablet by mouth every 4 (four) hours as needed for pain., Disp: 135 tablet, Rfl: 0   RYBELSUS 3 MG TABS, Take 1 tablet by mouth daily. (Patient not taking: Reported on 02/14/2024), Disp: , Rfl:    Sod Picosulfate-Mag Ox-Cit Acd (CLENPIQ ) 10-3.5-12 MG-GM -GM/160ML SOLN, Take 1 bottle at 5 PM followed by five 8 oz cups of water  and repeat 5 hours before procedure., Disp: 320 mL, Rfl: 0   spironolactone  (ALDACTONE ) 25 MG tablet, TAKE 1 TABLET(25 MG) BY MOUTH DAILY, Disp: 30 tablet, Rfl: 0   Past Medical History:  Diagnosis Date   Arthritis    Psoriatic   Cardiomyopathy (HCC)    CHF (congestive heart failure) (HCC)    DDD (degenerative disc disease), lumbar 08/02/2021   Hyperlipidemia    Hypertension    Psoriasis    Sciatica of left side 08/02/2021   Thyroid disease    Transient weakness of left leg 08/13/2019   Wears contact lenses    Assessment and Plan:  1. Sciatica of left side   2. Chronic left hip pain   3. Chronic, continuous use of opioids   4. Chronic pain syndrome   5. Pain in multiple finger joints   6. Left wrist pain   7. Psoriatic arthritis (HCC)    Based on conversation after review of the [place]  practitioner database information is appropriate to refill her medicines for today September 2 and October 1.  I am going to schedule her for evaluation with Dr. Katrina for possible decompression following evaluation.  I want her to continue with her stretching strengthening exercises.  Ultimately she may be a candidate for bilateral facet injection contingent on his review.  A refill will be early and we talked about keeping her total dosing at 135 tablets/month.  No other changes are initiated.  Continue follow-up with her primary care physician for baseline medical care. Follow Up Instructions:    I discussed  the assessment and treatment plan with the patient. The patient was provided an opportunity to ask questions and all were answered. The patient agreed with the plan and demonstrated an understanding of the instructions.   The patient was advised to call back or seek an in-person evaluation if the symptoms worsen or if the condition fails to improve as anticipated.  I provided 30 minutes of non-face-to-face time during this encounter.   Lynwood KANDICE Clause, MD

## 2024-07-01 ENCOUNTER — Ambulatory Visit: Attending: Anesthesiology | Admitting: Anesthesiology

## 2024-07-01 DIAGNOSIS — G894 Chronic pain syndrome: Secondary | ICD-10-CM

## 2024-07-01 DIAGNOSIS — M5432 Sciatica, left side: Secondary | ICD-10-CM

## 2024-07-01 DIAGNOSIS — Z79891 Long term (current) use of opiate analgesic: Secondary | ICD-10-CM

## 2024-07-01 DIAGNOSIS — F1190 Opioid use, unspecified, uncomplicated: Secondary | ICD-10-CM

## 2024-07-01 DIAGNOSIS — M25532 Pain in left wrist: Secondary | ICD-10-CM

## 2024-07-01 DIAGNOSIS — M25549 Pain in joints of unspecified hand: Secondary | ICD-10-CM | POA: Diagnosis not present

## 2024-07-01 DIAGNOSIS — M25552 Pain in left hip: Secondary | ICD-10-CM | POA: Diagnosis not present

## 2024-07-01 DIAGNOSIS — G8929 Other chronic pain: Secondary | ICD-10-CM

## 2024-07-01 DIAGNOSIS — L4050 Arthropathic psoriasis, unspecified: Secondary | ICD-10-CM

## 2024-07-02 NOTE — Progress Notes (Signed)
 Safety precautions to be maintained throughout the outpatient stay will include: orient to surroundings, keep bed in low position, maintain call bell within reach at all times, provide assistance with transfer out of bed and ambulation.

## 2024-07-15 ENCOUNTER — Encounter: Payer: Self-pay | Admitting: Anesthesiology

## 2024-07-15 MED ORDER — OXYCODONE-ACETAMINOPHEN 10-325 MG PO TABS: 1.0000 | ORAL_TABLET | ORAL | 0 refills | Status: DC | PRN

## 2024-07-15 NOTE — Progress Notes (Signed)
 Virtual Visit via Telephone Note  I connected with Krista Kennedy on 07/15/24 at  9:00 AM EDT by telephone and verified that I am speaking with the correct person using two identifiers.  Location: Patient: Home Provider: [facility] control center   I discussed the limitations, risks, security and privacy concerns of performing an evaluation and management service by telephone and the availability of in person appointments. I also discussed with the patient that there may be a patient responsible charge related to this service. The patient expressed understanding and agreed to proceed.   History of Present Illness: I was able to reach out to Krista Kennedy via telephone as we could not link for the video portion the conference.  She reports that she is still doing well on her current regimen with the Percocet 10 tablets.  These continue to keep a good part of her pain under control however she has had more problems recently with the low back.  She had previously requested an evaluation with Dr. Katrina and we had attempted to set this up but his office has not called.  I will have our office assist.  Otherwise she is continuing to do her daily activity as well as work full-time.  The medications enable her to stay active functional and keep her pain under reasonable control.  No side effects are reported.  Review of systems: General: No fevers or chills Pulmonary: No shortness of breath or dyspnea Cardiac: No angina or palpitations or lightheadedness GI: No abdominal pain or constipation Psych: No depression    Observations/Objective:  Current Outpatient Medications:    [START ON 08/01/2024] oxyCODONE -acetaminophen  (PERCOCET) 10-325 MG tablet, Take 1 tablet by mouth every 4 (four) hours as needed for pain., Disp: 135 tablet, Rfl: 0   albuterol  (VENTOLIN  HFA) 108 (90 Base) MCG/ACT inhaler, Inhale 2 puffs into the lungs every 6 (six) hours as needed for wheezing or shortness of breath., Disp: 1 each, Rfl:  0   Ascorbic Acid (VITAMIN C) 1000 MG tablet, Take 1,000 mg by mouth daily., Disp: , Rfl:    carvedilol  (COREG ) 25 MG tablet, Take 1 tablet (25 mg total) by mouth 2 (two) times daily. NO FURTHER REFILLS UNTIL SEEN IN CLINIC. MUST KEEP SCHEDULED APPOINTMENT., Disp: 180 tablet, Rfl: 0   Cholecalciferol 25 MCG (1000 UT) tablet, Take by mouth daily. , Disp: , Rfl:    clobetasol (TEMOVATE) 0.05 % external solution, Apply 1 application topically as needed. , Disp: , Rfl:    estradiol  (CLIMARA  - DOSED IN MG/24 HR) 0.025 mg/24hr patch, Place 1 patch (0.025 mg total) onto the skin once a week., Disp: 4 patch, Rfl: 0   fluticasone  (FLONASE ) 50 MCG/ACT nasal spray, Place 2 sprays into both nostrils daily., Disp: 16 g, Rfl: 3   ibuprofen  (ADVIL ) 600 MG tablet, Take 1 tablet (600 mg total) by mouth every 6 (six) hours as needed., Disp: 30 tablet, Rfl: 0   Ixekizumab 80 MG/ML SOSY, Inject 80 mg into the skin as directed. [facility], Disp: , Rfl:    levothyroxine  (SYNTHROID ) 100 MCG tablet, Take 1 tablet (100 mcg total) by mouth daily before breakfast., Disp: 90 tablet, Rfl: 0   levothyroxine  (SYNTHROID ) 75 MCG tablet, Take 75 mcg by mouth daily., Disp: , Rfl:    lisinopril  (ZESTRIL ) 20 MG tablet, TAKE 1 TABLET(20 MG) BY MOUTH DAILY, Disp: 90 tablet, Rfl: 0   Multiple Vitamin (MULTI VITAMIN PO), Take 1 tablet by mouth daily., Disp: , Rfl:    naloxone  (NARCAN ) nasal  spray 4 mg/0.1 mL, To reverse excess sedation from narcotics, Disp: 2 kit, Rfl: 1   omeprazole  (PRILOSEC) 40 MG capsule, Take 1 capsule (40 mg total) by mouth daily., Disp: 30 capsule, Rfl: 0   ondansetron  (ZOFRAN -ODT) 8 MG disintegrating tablet, Take 1 tablet (8 mg total) by mouth every 8 (eight) hours as needed for nausea or vomiting., Disp: 20 tablet, Rfl: 0   RYBELSUS 3 MG TABS, Take 1 tablet by mouth daily. (Patient not taking: Reported on 02/14/2024), Disp: , Rfl:    Sod Picosulfate-Mag Ox-Cit Acd (CLENPIQ ) 10-3.5-12 MG-GM -GM/160ML SOLN, Take 1  bottle at 5 PM followed by five 8 oz cups of water  and repeat 5 hours before procedure., Disp: 320 mL, Rfl: 0   spironolactone  (ALDACTONE ) 25 MG tablet, TAKE 1 TABLET(25 MG) BY MOUTH DAILY, Disp: 30 tablet, Rfl: 0  Past Medical History:  Diagnosis Date   Arthritis    Psoriatic   Cardiomyopathy (HCC)    CHF (congestive heart failure) (HCC)    DDD (degenerative disc disease), lumbar 08/02/2021   Hyperlipidemia    Hypertension    Psoriasis    Sciatica of left side 08/02/2021   Thyroid disease    Transient weakness of left leg 08/13/2019   Wears contact lenses     Assessment and Plan:  1. Sciatica of left side   2. Chronic left hip pain   3. Chronic, continuous use of opioids   4. Left wrist pain   5. Pain in multiple finger joints   6. Chronic pain syndrome   7. Psoriatic arthritis (HCC)   8. Psoriasis with arthropathy (HCC)    Based on the conversation of records appropriate to refill her medications for November 27.  Will schedule her for a 1 month return for in person evaluation in the clinic.  She is also due for routine urine drug screen.  Will work to establish a referral appointment with Dr. Katrina.  Continue core stretching strengthening exercises.  She we will defer on any interventional therapy at this time. Follow Up Instructions:    I discussed the assessment and treatment plan with the patient. The patient was provided an opportunity to ask questions and all were answered. The patient agreed with the plan and demonstrated an understanding of the instructions.   The patient was advised to call back or seek an in-person evaluation if the symptoms worsen or if the condition fails to improve as anticipated.  I provided 30 minutes of non-face-to-face time during this encounter.   Krista KANDICE Clause, MD

## 2024-07-31 ENCOUNTER — Other Ambulatory Visit: Payer: Self-pay | Admitting: Anesthesiology

## 2024-08-21 ENCOUNTER — Encounter: Payer: Self-pay | Admitting: Anesthesiology

## 2024-08-21 ENCOUNTER — Telehealth: Admitting: Anesthesiology

## 2024-08-21 ENCOUNTER — Telehealth: Payer: Self-pay | Admitting: *Deleted

## 2024-08-21 ENCOUNTER — Ambulatory Visit: Admitting: Anesthesiology

## 2024-08-21 VITALS — BP 122/69 | HR 86 | Temp 97.3°F | Resp 18 | Ht 70.0 in | Wt 150.0 lb

## 2024-08-21 DIAGNOSIS — M25512 Pain in left shoulder: Secondary | ICD-10-CM | POA: Diagnosis present

## 2024-08-21 DIAGNOSIS — F1190 Opioid use, unspecified, uncomplicated: Secondary | ICD-10-CM | POA: Diagnosis present

## 2024-08-21 DIAGNOSIS — G8929 Other chronic pain: Secondary | ICD-10-CM | POA: Insufficient documentation

## 2024-08-21 DIAGNOSIS — M25549 Pain in joints of unspecified hand: Secondary | ICD-10-CM | POA: Diagnosis present

## 2024-08-21 DIAGNOSIS — M25511 Pain in right shoulder: Secondary | ICD-10-CM | POA: Insufficient documentation

## 2024-08-21 DIAGNOSIS — Z79891 Long term (current) use of opiate analgesic: Secondary | ICD-10-CM

## 2024-08-21 DIAGNOSIS — M5432 Sciatica, left side: Secondary | ICD-10-CM | POA: Diagnosis present

## 2024-08-21 DIAGNOSIS — L4050 Arthropathic psoriasis, unspecified: Secondary | ICD-10-CM | POA: Insufficient documentation

## 2024-08-21 DIAGNOSIS — G894 Chronic pain syndrome: Secondary | ICD-10-CM | POA: Diagnosis present

## 2024-08-21 DIAGNOSIS — M25552 Pain in left hip: Secondary | ICD-10-CM | POA: Insufficient documentation

## 2024-08-21 DIAGNOSIS — M5441 Lumbago with sciatica, right side: Secondary | ICD-10-CM | POA: Diagnosis present

## 2024-08-21 DIAGNOSIS — M25532 Pain in left wrist: Secondary | ICD-10-CM | POA: Insufficient documentation

## 2024-08-21 DIAGNOSIS — M5442 Lumbago with sciatica, left side: Secondary | ICD-10-CM | POA: Insufficient documentation

## 2024-08-21 MED ORDER — OXYCODONE-ACETAMINOPHEN 10-325 MG PO TABS: 1.0000 | ORAL_TABLET | ORAL | 0 refills | Status: DC | PRN

## 2024-08-21 NOTE — Progress Notes (Unsigned)
 Nursing Pain Medication Assessment:  Safety precautions to be maintained throughout the outpatient stay will include: orient to surroundings, keep bed in low position, maintain call bell within reach at all times, provide assistance with transfer out of bed and ambulation.  Medication Inspection Compliance: {Blank single:19197::Ms. Hover did not comply with our request to bring her pills to be counted. She was reminded that bringing the medication bottles, even when empty, is a requirement.,Pill count conducted under aseptic conditions, in front of the patient. Neither the pills nor the bottle was removed from the patient's sight at any time. Once count was completed pills were immediately returned to the patient in their original bottle.}  Medication: Oxycodone /APAP Pill/Patch Count: 9 of 135 pills/patches remain Pill/Patch Appearance: {Blank single:19197::No markings,Markings inconsistent with prescribed medication,Markings consistent with prescribed medication} Bottle Appearance: {Blank single:19197::No label. Patient informed that medications must be transported in properly and accurately labeled containers.,Non-pharmacy container. Patient reminded that prescription medications must be kept in original, labeled, pharmacy bottle.,Old prescription bottle. Patient reminded that medications should always be kept in the newest prescription bottle.,No container available. Did not bring bottle(s) to appointment.,Standard pharmacy container. Clearly labeled.} Filled Date: *** / *** / {Blank single:19197::2024,2025} Last Medication intake:  {Blank single:19197::Ran out of medicine more than 48 hours ago,Day before yesterday,Yesterday,Today}

## 2024-08-22 NOTE — Progress Notes (Signed)
 Subjective:  Patient ID: Krista Kennedy, female    DOB: September 19, 1965  Age: 58 y.o. MRN: [id number]  CC: Back Pain (Lumbar midline and to the left ) and Hip Pain (Left )   Procedure: None  HPI Krista Kennedy presents for return evaluation.  Her last evaluation was 2 months ago and she continues to have complaints of baseline diffuse body pain associated with her rheumatoid arthritis.  This mainly involves her lower back hips knees and the quality and style of the pain remains stable in nature with no recent changes.  She takes her medications about every 4 hours for pain relief and these continue to work well for her however she is frustrated with the frequency with which she has to take the medications.  Otherwise she is in her usual state of health at this time.  No other changes are mentioned today.  She denies any side effects or problems with the medication.  No problems with constipation are noted at this time nor any excess sedation.  Outpatient Medications Prior to Visit  Medication Sig Dispense Refill   albuterol  (VENTOLIN  HFA) 108 (90 Base) MCG/ACT inhaler Inhale 2 puffs into the lungs every 6 (six) hours as needed for wheezing or shortness of breath. 1 each 0   Ascorbic Acid (VITAMIN C) 1000 MG tablet Take 1,000 mg by mouth daily.     carvedilol  (COREG ) 25 MG tablet Take 1 tablet (25 mg total) by mouth 2 (two) times daily. NO FURTHER REFILLS UNTIL SEEN IN CLINIC. MUST KEEP SCHEDULED APPOINTMENT. 180 tablet 0   Cholecalciferol 25 MCG (1000 UT) tablet Take by mouth daily.      clobetasol (TEMOVATE) 0.05 % external solution Apply 1 application topically as needed.      estradiol  (CLIMARA  - DOSED IN MG/24 HR) 0.025 mg/24hr patch Place 1 patch (0.025 mg total) onto the skin once a week. 4 patch 0   fluticasone  (FLONASE ) 50 MCG/ACT nasal spray Place 2 sprays into both nostrils daily. 16 g 3   gabapentin (NEURONTIN) 300 MG capsule Take 300 mg by mouth daily.     hydrocortisone-pramoxine  (ANALPRAM-HC) 2.5-1 % rectal cream Place 1 Application rectally daily as needed.     ibuprofen  (ADVIL ) 600 MG tablet Take 1 tablet (600 mg total) by mouth every 6 (six) hours as needed. 30 tablet 0   Ixekizumab 80 MG/ML SOSY Inject 80 mg into the skin as directed. [facility]     levothyroxine  (SYNTHROID ) 75 MCG tablet Take 75 mcg by mouth daily.     lisinopril  (ZESTRIL ) 20 MG tablet TAKE 1 TABLET(20 MG) BY MOUTH DAILY 90 tablet 0   Multiple Vitamin (MULTI VITAMIN PO) Take 1 tablet by mouth daily.     naloxone  (NARCAN ) nasal spray 4 mg/0.1 mL To reverse excess sedation from narcotics 2 kit 1   ondansetron  (ZOFRAN -ODT) 8 MG disintegrating tablet Take 1 tablet (8 mg total) by mouth every 8 (eight) hours as needed for nausea or vomiting. 20 tablet 0   spironolactone  (ALDACTONE ) 25 MG tablet TAKE 1 TABLET(25 MG) BY MOUTH DAILY 30 tablet 0   TALTZ 80 MG/ML pen Inject 80 mg into the skin every 28 (twenty-eight) days.     oxyCODONE -acetaminophen  (PERCOCET) 10-325 MG tablet Take 1 tablet by mouth every 4 (four) hours as needed for pain. 135 tablet 0   levothyroxine  (SYNTHROID ) 100 MCG tablet Take 1 tablet (100 mcg total) by mouth daily before breakfast. (Patient not taking: Reported on 08/21/2024) 90 tablet 0  omeprazole  (PRILOSEC) 40 MG capsule Take 1 capsule (40 mg total) by mouth daily. 30 capsule 0   RYBELSUS 3 MG TABS Take 1 tablet by mouth daily. (Patient not taking: Reported on 02/14/2024)     Sod Picosulfate-Mag Ox-Cit Acd (CLENPIQ ) 10-3.5-12 MG-GM -GM/160ML SOLN Take 1 bottle at 5 PM followed by five 8 oz cups of water  and repeat 5 hours before procedure. (Patient not taking: Reported on 08/21/2024) 320 mL 0   No facility-administered medications prior to visit.    Review of Systems CNS: No confusion or sedation Cardiac: No angina or palpitations GI: No abdominal pain or constipation Constitutional: No nausea vomiting fevers or chills  Objective:  BP 122/69 (BP Location: Right Arm,  Patient Position: Sitting, Cuff Size: Normal)   Pulse 86   Temp (!) 97.3 F (36.3 C) (Temporal)   Resp 18   Ht 5' 10 (1.778 m)   Wt 150 lb (68 kg)   SpO2 100%   BMI 21.52 kg/m    BP Readings from Last 3 Encounters:  08/21/24 122/69  02/14/24 121/62  07/31/23 134/88     Wt Readings from Last 3 Encounters:  08/21/24 150 lb (68 kg)  02/14/24 160 lb (72.6 kg)  07/31/23 162 lb (73.5 kg)     Physical Exam Pt is alert and oriented PERRL EOMI HEART IS RRR no murmur or rub LCTA no wheezing or rales MUSCULOSKELETAL reveals some paraspinous muscle tenderness in the lumbar spine but no overt trigger points.  She ambulates with a mildly antalgic gait but muscle tone and bulk to the lower extremities remains at baseline.  She does have some pain on extension with left and right lateral rotation  Labs  Lab Results  Component Value Date   HGBA1C 6.4 (H) 05/06/2020   HGBA1C 6.7 (H) 07/20/2018   HGBA1C 5.9 (H) 04/11/2017   Lab Results  Component Value Date   LDLCALC 118 (H) 05/06/2020   CREATININE 1.29 (H) 09/04/2020    -------------------------------------------------------------------------------------------------------------------- Lab Results  Component Value Date   WBC 7.2 09/04/2020   HGB 13.4 09/04/2020   HCT 40.6 09/04/2020   PLT 270 09/04/2020   GLUCOSE 117 (H) 09/04/2020   CHOL 200 (H) 05/06/2020   TRIG 122 05/06/2020   HDL 60 05/06/2020   LDLCALC 118 (H) 05/06/2020   ALT 16 05/06/2020   AST 24 05/06/2020   NA 136 09/04/2020   K 3.9 09/04/2020   CL 97 (L) 09/04/2020   CREATININE 1.29 (H) 09/04/2020   BUN 24 (H) 09/04/2020   CO2 28 09/04/2020   TSH 0.130 (L) 05/06/2020   HGBA1C 6.4 (H) 05/06/2020    --------------------------------------------------------------------------------------------------------------------- DG PAIN CLINIC C-ARM 1-60 MIN NO REPORT Result Date: 02/14/2024 Fluoro was used, but no Radiologist interpretation will be provided. Please  refer to NOTES tab for provider progress note.    Assessment & Plan:   Krista Kennedy was seen today for back pain and hip pain.  Diagnoses and all orders for this visit:  Sciatica of left side  Chronic left hip pain  Chronic, continuous use of opioids -     ToxASSURE Select 13 (MW), Urine  Left wrist pain  Pain in multiple finger joints  Chronic pain syndrome -     ToxASSURE Select 13 (MW), Urine  Psoriatic arthritis (HCC)  Psoriasis with arthropathy (HCC)  Chronic bilateral low back pain with bilateral sciatica  Chronic pain of both shoulders  Other orders -     oxyCODONE -acetaminophen  (PERCOCET) 10-325 MG tablet; Take 1  tablet by mouth every 4 (four) hours as needed for pain.        ----------------------------------------------------------------------------------------------------------------------  Problem List Items Addressed This Visit       Unprioritized   Chronic hip pain (Chronic)   Relevant Medications   gabapentin (NEURONTIN) 300 MG capsule   oxyCODONE -acetaminophen  (PERCOCET) 10-325 MG tablet (Start on 08/27/2024)   Chronic low back pain (Chronic)   Relevant Medications   gabapentin (NEURONTIN) 300 MG capsule   oxyCODONE -acetaminophen  (PERCOCET) 10-325 MG tablet (Start on 08/27/2024)   Chronic pain syndrome (Chronic)   Relevant Medications   gabapentin (NEURONTIN) 300 MG capsule   oxyCODONE -acetaminophen  (PERCOCET) 10-325 MG tablet (Start on 08/27/2024)   Other Relevant Orders   ToxASSURE Select 13 (MW), Urine   Left wrist pain (Chronic)   Chronic shoulder pain   Relevant Medications   gabapentin (NEURONTIN) 300 MG capsule   oxyCODONE -acetaminophen  (PERCOCET) 10-325 MG tablet (Start on 08/27/2024)   Chronic, continuous use of opioids   Relevant Orders   ToxASSURE Select 13 (MW), Urine   Pain in multiple finger joints   Psoriasis with arthropathy (HCC)   Relevant Medications   oxyCODONE -acetaminophen  (PERCOCET) 10-325 MG tablet (Start on  08/27/2024)   Sciatica of left side - Primary   Relevant Medications   gabapentin (NEURONTIN) 300 MG capsule   Other Visit Diagnoses       Psoriatic arthritis (HCC)       Relevant Medications   oxyCODONE -acetaminophen  (PERCOCET) 10-325 MG tablet (Start on 08/27/2024)         ----------------------------------------------------------------------------------------------------------------------  1. Sciatica of left side (Primary) Continue core stretching strengthening exercises.  Her last epidural was back in June and she continues to have good relief from this.  This has helped with her sciatica but the baseline joint pain that she has had remains.  2. Chronic left hip pain As above  3. Chronic, continuous use of opioids I have reviewed the [place]  practitioner database information is appropriate for refill.  He is to be generated for December 23.  We have also talked about transitioning her over to a longer-term medication such as Avinza  or MS Contin .  She is concerned about the potential cost of this and is going to research this with her existing insurance company and we may consider transition over to 1 of these medications at her next prescription. - ToxASSURE Select 13 (MW), Urine  4. Left wrist pain As above  5. Pain in multiple finger joints As above  6. Chronic pain syndrome As above - ToxASSURE Select 13 (MW), Urine  7. Psoriatic arthritis (HCC) Continue follow-up with her primary care physicians and rheumatologist  8. Psoriasis with arthropathy (HCC)   9. Chronic bilateral low back pain with bilateral sciatica   10. Chronic pain of both shoulders     ----------------------------------------------------------------------------------------------------------------------  I am having Krista Kennedy maintain her vitamin C, Cholecalciferol, ixekizumab, clobetasol, naloxone , fluticasone , Multiple Vitamin (MULTI VITAMIN PO), estradiol , Clenpiq , omeprazole ,  albuterol , spironolactone , lisinopril , levothyroxine , carvedilol , ibuprofen , levothyroxine , Rybelsus, ondansetron , gabapentin, hydrocortisone-pramoxine, Taltz, and oxyCODONE -acetaminophen .   Meds ordered this encounter  Medications   oxyCODONE -acetaminophen  (PERCOCET) 10-325 MG tablet    Sig: Take 1 tablet by mouth every 4 (four) hours as needed for pain.    Dispense:  135 tablet    Refill:  0   Patient's Medications  New Prescriptions   No medications on file  Previous Medications   ALBUTEROL  (VENTOLIN  HFA) 108 (90 BASE) MCG/ACT INHALER    Inhale 2 puffs into the lungs  every 6 (six) hours as needed for wheezing or shortness of breath.   ASCORBIC ACID (VITAMIN C) 1000 MG TABLET    Take 1,000 mg by mouth daily.   CARVEDILOL  (COREG ) 25 MG TABLET    Take 1 tablet (25 mg total) by mouth 2 (two) times daily. NO FURTHER REFILLS UNTIL SEEN IN CLINIC. MUST KEEP SCHEDULED APPOINTMENT.   CHOLECALCIFEROL 25 MCG (1000 UT) TABLET    Take by mouth daily.    CLOBETASOL (TEMOVATE) 0.05 % EXTERNAL SOLUTION    Apply 1 application topically as needed.    ESTRADIOL  (CLIMARA  - DOSED IN MG/24 HR) 0.025 MG/24HR PATCH    Place 1 patch (0.025 mg total) onto the skin once a week.   FLUTICASONE  (FLONASE ) 50 MCG/ACT NASAL SPRAY    Place 2 sprays into both nostrils daily.   GABAPENTIN (NEURONTIN) 300 MG CAPSULE    Take 300 mg by mouth daily.   HYDROCORTISONE-PRAMOXINE (ANALPRAM-HC) 2.5-1 % RECTAL CREAM    Place 1 Application rectally daily as needed.   IBUPROFEN  (ADVIL ) 600 MG TABLET    Take 1 tablet (600 mg total) by mouth every 6 (six) hours as needed.   IXEKIZUMAB 80 MG/ML SOSY    Inject 80 mg into the skin as directed. [facility]   LEVOTHYROXINE  (SYNTHROID ) 100 MCG TABLET    Take 1 tablet (100 mcg total) by mouth daily before breakfast.   LEVOTHYROXINE  (SYNTHROID ) 75 MCG TABLET    Take 75 mcg by mouth daily.   LISINOPRIL  (ZESTRIL ) 20 MG TABLET    TAKE 1 TABLET(20 MG) BY MOUTH DAILY   MULTIPLE VITAMIN (MULTI  VITAMIN PO)    Take 1 tablet by mouth daily.   NALOXONE  (NARCAN ) NASAL SPRAY 4 MG/0.1 ML    To reverse excess sedation from narcotics   OMEPRAZOLE  (PRILOSEC) 40 MG CAPSULE    Take 1 capsule (40 mg total) by mouth daily.   ONDANSETRON  (ZOFRAN -ODT) 8 MG DISINTEGRATING TABLET    Take 1 tablet (8 mg total) by mouth every 8 (eight) hours as needed for nausea or vomiting.   RYBELSUS 3 MG TABS    Take 1 tablet by mouth daily.   SOD PICOSULFATE-MAG OX-CIT ACD (CLENPIQ ) 10-3.5-12 MG-GM -GM/160ML SOLN    Take 1 bottle at 5 PM followed by five 8 oz cups of water  and repeat 5 hours before procedure.   SPIRONOLACTONE  (ALDACTONE ) 25 MG TABLET    TAKE 1 TABLET(25 MG) BY MOUTH DAILY   TALTZ 80 MG/ML PEN    Inject 80 mg into the skin every 28 (twenty-eight) days.  Modified Medications   Modified Medication Previous Medication   OXYCODONE -ACETAMINOPHEN  (PERCOCET) 10-325 MG TABLET oxyCODONE -acetaminophen  (PERCOCET) 10-325 MG tablet      Take 1 tablet by mouth every 4 (four) hours as needed for pain.    Take 1 tablet by mouth every 4 (four) hours as needed for pain.  Discontinued Medications   No medications on file   ----------------------------------------------------------------------------------------------------------------------  Follow-up: Return in about 2 months (around 10/22/2024) for evaluation, med refill.    Lynwood KANDICE Clause, MD

## 2024-08-25 LAB — TOXASSURE SELECT 13 (MW), URINE

## 2024-09-16 ENCOUNTER — Other Ambulatory Visit: Payer: Self-pay | Admitting: *Deleted

## 2024-09-16 ENCOUNTER — Encounter: Payer: Self-pay | Admitting: Anesthesiology

## 2024-09-16 NOTE — Telephone Encounter (Signed)
 Rx request sent to Dr. Myra to send in script for January.

## 2024-09-18 MED ORDER — OXYCODONE-ACETAMINOPHEN 10-325 MG PO TABS: 1.0000 | ORAL_TABLET | ORAL | 0 refills | Status: AC | PRN

## 2024-09-20 ENCOUNTER — Ambulatory Visit
Admission: RE | Admit: 2024-09-20 | Discharge: 2024-09-20 | Disposition: A | Discharge status: Home / Self Care | Source: Ambulatory Visit | Attending: Physician Assistant | Admitting: Physician Assistant

## 2024-09-20 VITALS — BP 144/85 | HR 103 | Temp 99.2°F | Resp 16 | Ht 70.0 in | Wt 149.9 lb

## 2024-09-20 DIAGNOSIS — R051 Acute cough: Secondary | ICD-10-CM | POA: Diagnosis not present

## 2024-09-20 DIAGNOSIS — J069 Acute upper respiratory infection, unspecified: Secondary | ICD-10-CM

## 2024-09-20 MED ORDER — PROMETHAZINE-DM 6.25-15 MG/5ML PO SYRP: 5.0000 mL | ORAL_SOLUTION | Freq: Four times a day (QID) | ORAL | 0 refills | Status: AC | PRN

## 2024-09-20 MED ORDER — BENZONATATE 200 MG PO CAPS: 200.0000 mg | ORAL_CAPSULE | Freq: Three times a day (TID) | ORAL | 0 refills | Status: AC | PRN

## 2024-09-20 NOTE — ED Provider Notes (Signed)
 " [facility]    CSN: [id number] Arrival date & time: 09/20/24  1724      History   Chief Complaint Chief Complaint  Patient presents with   Nasal Congestion   Cough    HPI Krista Kennedy is a 59 y.o. female presenting for cough, congestion, fatigue, bilateral ear pressure, shortness of breath and chest pain when coughing x 3 days. Denies fever, sore throat, sinus pain, vomiting and diarrhea. She says she has taken 2 COVID tests at home and they were negative. She says her son and husband have been ill with similar symptoms.  Patient says her cough is worse at night and causing her to have difficulty sleeping.  She would like codeine cough medication.  She says her husband is taking it and he is sleeping well.  She does take oxycodone  daily for chronic pain.  Has not tried OTC cough medicine.  HPI  Past Medical History:  Diagnosis Date   Arthritis    Psoriatic   Cardiomyopathy (HCC)    CHF (congestive heart failure) (HCC)    DDD (degenerative disc disease), lumbar 08/02/2021   Hyperlipidemia    Hypertension    Psoriasis    Sciatica of left side 08/02/2021   Thyroid disease    Transient weakness of left leg 08/13/2019   Wears contact lenses     Patient Active Problem List   Diagnosis Date Noted   Sciatica of left side 08/02/2021   DDD (degenerative disc disease), lumbar 08/02/2021   Encounter for screening colonoscopy    Gastric erythema    Nausea and vomiting    Transient weakness of left leg 08/13/2019   Recurrent major depressive disorder, in partial remission 06/26/2019   Chronic, continuous use of opioids 02/19/2019   History of cardiomyopathy 08/21/2018   Shortness of breath 08/21/2018   Palpitations 08/21/2018   Chronic shoulder pain 06/14/2017   Encounter for long-term (current) use of other medications 05/24/2017   Left wrist pain 04/11/2017   Chronic sacroiliac joint pain 04/11/2017   Hyperlipidemia 03/15/2017   Elevated hemoglobin A1c  03/15/2017   Therapeutic opioid induced constipation 02/02/2017   Chronic pain syndrome 04/07/2016   Chronic hip pain 04/07/2016   Pain in multiple finger joints 04/07/2016   Allergic rhinitis due to pollen 09/08/2015   Other long term (current) drug therapy 08/13/2015   Depression 08/06/2015   Opioid dependence, daily use (HCC) 08/06/2015   High risk medications (not anticoagulants) long-term use 02/11/2015   Dysmenorrhea 10/29/2013   Congestive heart failure (HCC) 10/29/2013   Menorrhagia 10/15/2013   Fibroid 10/10/2013   Hyperglycemia 08/22/2013   Hypertension 04/30/2013   Psoriasis 04/30/2013   Metrorrhagia 12/07/2011   Chronic low back pain 11/02/2011   GERD (gastroesophageal reflux disease) 07/12/2010   Hypothyroidism 07/12/2010   Psoriasis with arthropathy (HCC) 07/12/2010   Avitaminosis D 07/12/2010   Abnormal mammogram 07/12/2010   Breast screening 07/12/2010   Encounter for routine gynecological examination 07/12/2010   Insomnia 07/12/2010   Vitamin D deficiency 07/12/2010    Past Surgical History:  Procedure Laterality Date   COLONOSCOPY WITH PROPOFOL  N/A 06/30/2020   Procedure: COLONOSCOPY WITH PROPOFOL ;  Surgeon: Janalyn Keene NOVAK, MD;  Location: [facility];  Service: Endoscopy;  Laterality: N/A;   COLONOSCOPY WITH PROPOFOL  N/A 07/20/2020   Procedure: COLONOSCOPY WITH PROPOFOL ;  Surgeon: Janalyn Keene NOVAK, MD;  Location: [facility];  Service: Endoscopy;  Laterality: N/A;  poor prep   ENDOMETRIAL ABLATION  2015   ESOPHAGOGASTRODUODENOSCOPY (  EGD) WITH PROPOFOL  N/A 06/30/2020   Procedure: ESOPHAGOGASTRODUODENOSCOPY (EGD) WITH BIOPSY;  Surgeon: Janalyn Keene NOVAK, MD;  Location: [facility];  Service: Endoscopy;  Laterality: N/A;   THYROIDECTOMY     TONSILLECTOMY     TUBAL LIGATION      OB History     Gravida  5   Para  2   Term  2   Preterm      AB  3   Living  2      SAB  3   IAB      Ectopic      Multiple       Live Births  2            Home Medications    Prior to Admission medications  Medication Sig Start Date End Date Taking? Authorizing Provider  benzonatate  (TESSALON ) 200 MG capsule Take 1 capsule (200 mg total) by mouth 3 (three) times daily as needed. 09/20/24  Yes Arvis Huxley B, PA-C  levothyroxine  (SYNTHROID ) 100 MCG tablet Take 1 tablet (100 mcg total) by mouth daily before breakfast. 10/06/20  Yes Gollan, Timothy J, MD  promethazine -dextromethorphan (PROMETHAZINE -DM) 6.25-15 MG/5ML syrup Take 5 mLs by mouth 4 (four) times daily as needed. 09/20/24  Yes Arvis Huxley B, PA-C  albuterol  (VENTOLIN  HFA) 108 (90 Base) MCG/ACT inhaler Inhale 2 puffs into the lungs every 6 (six) hours as needed for wheezing or shortness of breath. 09/04/20   Alona Knee, PA-C  Ascorbic Acid (VITAMIN C) 1000 MG tablet Take 1,000 mg by mouth daily.    [provider]  carvedilol  (COREG ) 25 MG tablet Take 1 tablet (25 mg total) by mouth 2 (two) times daily. NO FURTHER REFILLS UNTIL SEEN IN CLINIC. MUST KEEP SCHEDULED APPOINTMENT. 03/11/21   Gollan, Timothy J, MD  Cholecalciferol 25 MCG (1000 UT) tablet Take by mouth daily.     [provider]  clobetasol (TEMOVATE) 0.05 % external solution Apply 1 application topically as needed.  03/22/17   [provider]  estradiol  (CLIMARA  - DOSED IN MG/24 HR) 0.025 mg/24hr patch Place 1 patch (0.025 mg total) onto the skin once a week. 05/08/20   Lynda Bradley, CNM  fluticasone  (FLONASE ) 50 MCG/ACT nasal spray Place 2 sprays into both nostrils daily. 12/18/18   Nyle Tinnie Fuller, NP  gabapentin (NEURONTIN) 300 MG capsule Take 300 mg by mouth daily. 07/13/24   [provider]  hydrocortisone-pramoxine [facility]) 2.5-1 % rectal cream Place 1 Application rectally daily as needed. 04/30/24   [provider]  ibuprofen  (ADVIL ) 600 MG tablet Take 1 tablet (600 mg total) by mouth every 6 (six) hours as needed. 12/12/21   Mortenson,  Ashley, MD  Ixekizumab 80 MG/ML SOSY Inject 80 mg into the skin as directed. [facility]    [provider]  levothyroxine  (SYNTHROID ) 75 MCG tablet Take 75 mcg by mouth daily. 09/21/21   [provider]  lisinopril  (ZESTRIL ) 20 MG tablet TAKE 1 TABLET(20 MG) BY MOUTH DAILY 09/08/20   Gollan, Timothy J, MD  Multiple Vitamin (MULTI VITAMIN PO) Take 1 tablet by mouth daily.    [provider]  naloxone  (NARCAN ) nasal spray 4 mg/0.1 mL To reverse excess sedation from narcotics 12/28/17   Myra Lynwood MATSU, MD  omeprazole  (PRILOSEC) 40 MG capsule Take 1 capsule (40 mg total) by mouth daily. 07/20/20   Janalyn Keene NOVAK, MD  ondansetron  (ZOFRAN -ODT) 8 MG disintegrating tablet Take 1 tablet (8 mg total) by mouth every 8 (  eight) hours as needed for nausea or vomiting. 07/27/23   Gandhi, Safal, PA-C  oxyCODONE -acetaminophen  (PERCOCET) 10-325 MG tablet Take 1 tablet by mouth every 4 (four) hours as needed for pain. 09/18/24 10/18/24  Myra Lynwood MATSU, MD  RYBELSUS 3 MG TABS Take 1 tablet by mouth daily. Patient not taking: Reported on 02/14/2024    [provider]  Sod Picosulfate-Mag Ox-Cit Acd (CLENPIQ ) 10-3.5-12 MG-GM -GM/160ML SOLN Take 1 bottle at 5 PM followed by five 8 oz cups of water  and repeat 5 hours before procedure. Patient not taking: Reported on 08/21/2024 07/01/20   Janalyn Keene NOVAK, MD  spironolactone  (ALDACTONE ) 25 MG tablet TAKE 1 TABLET(25 MG) BY MOUTH DAILY 09/08/20   Gollan, Timothy J, MD  TALTZ 80 MG/ML pen Inject 80 mg into the skin every 28 (twenty-eight) days. 07/11/24   [provider]  pantoprazole  (PROTONIX ) 40 MG tablet Take 1 tablet (40 mg total) by mouth 2 (two) times daily. 07/01/20 07/20/20  Janalyn Keene NOVAK, MD    Family History Family History  Problem Relation Age of Onset   Hypertension Mother    Heart disease Father        CABG    Hyperlipidemia Father    Hypertension Father    Diabetes Father     Social  History Social History[1]   Allergies   Other and Sulfa antibiotics   Review of Systems Review of Systems  Constitutional:  Positive for fatigue. Negative for chills, diaphoresis and fever.  HENT:  Positive for congestion and rhinorrhea. Negative for ear pain, sinus pressure, sinus pain and sore throat.   Respiratory:  Positive for cough and shortness of breath.   Cardiovascular:  Positive for chest pain (when coughing).  Gastrointestinal:  Negative for abdominal pain, nausea and vomiting.  Musculoskeletal:  Negative for arthralgias and myalgias.  Skin:  Negative for rash.  Neurological:  Negative for weakness and headaches.  Hematological:  Negative for adenopathy.     Physical Exam Triage Vital Signs ED Triage Vitals  Encounter Vitals Group     BP 09/20/24 1737 (!) 144/85     Girls Systolic BP Percentile --      Girls Diastolic BP Percentile --      Boys Systolic BP Percentile --      Boys Diastolic BP Percentile --      Pulse Rate 09/20/24 1737 (!) 103     Resp 09/20/24 1737 16     Temp 09/20/24 1737 99.2 F (37.3 C)     Temp Source 09/20/24 1737 Oral     SpO2 09/20/24 1737 97 %     Weight 09/20/24 1736 149 lb 14.6 oz (68 kg)     Height 09/20/24 1736 5' 10 (1.778 m)     Head Circumference --      Peak Flow --      Pain Score 09/20/24 1736 4     Pain Loc --      Pain Education --      Exclude from Growth Chart --    No data found.  Updated Vital Signs BP (!) 144/85 (BP Location: Right Arm)   Pulse (!) 103   Temp 99.2 F (37.3 C) (Oral)   Resp 16   Ht 5' 10 (1.778 m)   Wt 149 lb 14.6 oz (68 kg)   SpO2 97%   BMI 21.51 kg/m      Physical Exam Vitals and nursing note reviewed.  Constitutional:      General: She  is not in acute distress.    Appearance: Normal appearance. She is not ill-appearing or toxic-appearing.  HENT:     Head: Normocephalic and atraumatic.     Right Ear: Tympanic membrane, ear canal and external ear normal.     Left Ear:  Tympanic membrane, ear canal and external ear normal.     Nose: Congestion present.     Mouth/Throat:     Mouth: Mucous membranes are moist.     Pharynx: Oropharynx is clear.  Eyes:     General: No scleral icterus.       Right eye: No discharge.        Left eye: No discharge.     Conjunctiva/sclera: Conjunctivae normal.  Cardiovascular:     Rate and Rhythm: Regular rhythm. Tachycardia present.     Heart sounds: Normal heart sounds.  Pulmonary:     Effort: Pulmonary effort is normal. No respiratory distress.     Breath sounds: Normal breath sounds.  Musculoskeletal:     Cervical back: Neck supple.  Skin:    General: Skin is dry.  Neurological:     General: No focal deficit present.     Mental Status: She is alert. Mental status is at baseline.     Motor: No weakness.     Gait: Gait normal.  Psychiatric:        Mood and Affect: Mood normal.        Behavior: Behavior normal.      UC Treatments / Results  Labs (all labs ordered are listed, but only abnormal results are displayed) Labs Reviewed - No data to display  EKG   Radiology No results found.  Procedures Procedures (including critical care time)  Medications Ordered in UC Medications - No data to display  Initial Impression / Assessment and Plan / UC Course  I have reviewed the triage vital signs and the nursing notes.  Pertinent labs & imaging results that were available during my care of the patient were reviewed by me and considered in my medical decision making (see chart for details).   59 year old female presents for 3-day history of cough, congestion, fatigue, chest pain when coughing, shortness of breath and sleep disturbance related to cough.  Husband and son have similar symptoms.  She has tested negative for COVID at home.  She request codeine cough medication.  She is chronically on oxycodone .  She is afebrile and overall well-appearing.  Patient has some mild nasal congestion.  No evidence of ear  infection.  Throat clear.  Chest clear.  Heart regular rhythm.  Patient declined COVID and flu testing in clinic.  Patient symptoms consistent with viral URI.  Supportive care encouraged with increased rest and fluids.  Advised treating with Promethazine  DM and benzonatate .  Patient requested codeine cough medication.  Explained I did not feel comfortable prescribing this as she is on oxycodone  for chronic pain.  Also, she has not tried any OTC cough medications.  Reviewed return precautions.  Offered work note, but patient states she will go to work tomorrow and the following day.   Final Clinical Impressions(s) / UC Diagnoses   Final diagnoses:  Acute cough  Viral upper respiratory tract infection     Discharge Instructions      URI/COLD SYMPTOMS: Your exam today is consistent with a viral illness. Antibiotics are not indicated at this time. Use medications as directed, including cough syrup, nasal saline, and decongestants. Your symptoms should improve over the next few days and resolve  within 7-10 days. Increase rest and fluids. F/u if symptoms worsen or predominate such as sore throat, ear pain, productive cough, shortness of breath, or if you develop high fevers or worsening fatigue over the next several days.       ED Prescriptions     Medication Sig Dispense Auth. Provider   promethazine -dextromethorphan (PROMETHAZINE -DM) 6.25-15 MG/5ML syrup Take 5 mLs by mouth 4 (four) times daily as needed. 118 mL Arvis Huxley B, PA-C   benzonatate  (TESSALON ) 200 MG capsule Take 1 capsule (200 mg total) by mouth 3 (three) times daily as needed. 30 capsule Arvis Huxley NOVAK, PA-C      I have reviewed the PDMP during this encounter.     [1]  Social History Tobacco Use   Smoking status: Some Days    Types: E-cigarettes    Passive exposure: Never   Smokeless tobacco: Current  Vaping Use   Vaping status: Former   Substances: Flavoring  Substance Use Topics   Alcohol use: Not  Currently    Comment: social   Drug use: No     Arvis Huxley NOVAK DEVONNA 09/20/24 1803  "

## 2024-09-20 NOTE — Discharge Instructions (Signed)
 URI/COLD SYMPTOMS: Your exam today is consistent with a viral illness. Antibiotics are not indicated at this time. Use medications as directed, including cough syrup, nasal saline, and decongestants. Your symptoms should improve over the next few days and resolve within 7-10 days. Increase rest and fluids. F/u if symptoms worsen or predominate such as sore throat, ear pain, productive cough, shortness of breath, or if you develop high fevers or worsening fatigue over the next several days.

## 2024-09-20 NOTE — ED Triage Notes (Addendum)
 Pt c/o nasal congestion, cough, chest tightness, bilateral ear pain, shortness of breath. Cough is worse at night. Denies fever.  Pt states she did 2 covid test and they were negative. Declines testing today.

## 2024-10-17 ENCOUNTER — Telehealth: Admitting: Anesthesiology
